# Patient Record
Sex: Male | Born: 1945 | Race: White | Hispanic: No | Marital: Married | Smoking: Former smoker
Health system: Southern US, Community
[De-identification: ages and names within clinical notes are randomized; demographics above are authoritative.]

## PROBLEM LIST (undated history)

## (undated) DIAGNOSIS — I4891 Unspecified atrial fibrillation: Secondary | ICD-10-CM

## (undated) DIAGNOSIS — F32A Depression, unspecified: Secondary | ICD-10-CM

## (undated) DIAGNOSIS — I959 Hypotension, unspecified: Secondary | ICD-10-CM

## (undated) DIAGNOSIS — F329 Major depressive disorder, single episode, unspecified: Secondary | ICD-10-CM

## (undated) DIAGNOSIS — G629 Polyneuropathy, unspecified: Secondary | ICD-10-CM

## (undated) DIAGNOSIS — K219 Gastro-esophageal reflux disease without esophagitis: Secondary | ICD-10-CM

## (undated) DIAGNOSIS — Z950 Presence of cardiac pacemaker: Secondary | ICD-10-CM

---
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
## 2018-06-09 ENCOUNTER — Other Ambulatory Visit: Payer: Self-pay

## 2018-06-09 ENCOUNTER — Other Ambulatory Visit (HOSPITAL_COMMUNITY): Payer: Self-pay

## 2018-06-09 DIAGNOSIS — M542 Cervicalgia: Secondary | ICD-10-CM

## 2018-07-11 ENCOUNTER — Encounter (HOSPITAL_COMMUNITY): Payer: Self-pay

## 2018-07-11 ENCOUNTER — Ambulatory Visit (HOSPITAL_COMMUNITY): Payer: Non-veteran care

## 2019-05-31 ENCOUNTER — Other Ambulatory Visit: Payer: Self-pay

## 2019-05-31 ENCOUNTER — Emergency Department (HOSPITAL_COMMUNITY)
Admission: EM | Admit: 2019-05-31 | Discharge: 2019-05-31 | Disposition: A | Discharge status: Home / Self Care | Payer: No Typology Code available for payment source | Attending: Emergency Medicine | Admitting: Emergency Medicine

## 2019-05-31 ENCOUNTER — Encounter (HOSPITAL_COMMUNITY): Payer: Self-pay | Admitting: Emergency Medicine

## 2019-05-31 ENCOUNTER — Emergency Department (HOSPITAL_COMMUNITY): Payer: No Typology Code available for payment source

## 2019-05-31 DIAGNOSIS — Y999 Unspecified external cause status: Secondary | ICD-10-CM | POA: Diagnosis not present

## 2019-05-31 DIAGNOSIS — I4891 Unspecified atrial fibrillation: Secondary | ICD-10-CM | POA: Insufficient documentation

## 2019-05-31 DIAGNOSIS — G44309 Post-traumatic headache, unspecified, not intractable: Secondary | ICD-10-CM

## 2019-05-31 DIAGNOSIS — S79911A Unspecified injury of right hip, initial encounter: Secondary | ICD-10-CM | POA: Insufficient documentation

## 2019-05-31 DIAGNOSIS — Y92015 Private garage of single-family (private) house as the place of occurrence of the external cause: Secondary | ICD-10-CM | POA: Diagnosis not present

## 2019-05-31 DIAGNOSIS — M85652 Other cyst of bone, left thigh: Secondary | ICD-10-CM | POA: Diagnosis not present

## 2019-05-31 DIAGNOSIS — Y939 Activity, unspecified: Secondary | ICD-10-CM | POA: Insufficient documentation

## 2019-05-31 DIAGNOSIS — M25551 Pain in right hip: Secondary | ICD-10-CM

## 2019-05-31 DIAGNOSIS — W01198A Fall on same level from slipping, tripping and stumbling with subsequent striking against other object, initial encounter: Secondary | ICD-10-CM | POA: Diagnosis not present

## 2019-05-31 DIAGNOSIS — Z950 Presence of cardiac pacemaker: Secondary | ICD-10-CM | POA: Insufficient documentation

## 2019-05-31 HISTORY — DX: Gastro-esophageal reflux disease without esophagitis: K21.9

## 2019-05-31 HISTORY — DX: Polyneuropathy, unspecified: G62.9

## 2019-05-31 HISTORY — DX: Presence of cardiac pacemaker: Z95.0

## 2019-05-31 HISTORY — DX: Hypotension, unspecified: I95.9

## 2019-05-31 HISTORY — DX: Depression, unspecified: F32.A

## 2019-05-31 HISTORY — DX: Unspecified atrial fibrillation: I48.91

## 2019-05-31 NOTE — ED Triage Notes (Signed)
Patient reports holding dog and got pulled over, falling on left hip, landing on concrete. C/o left hip radiating to knee.

## 2019-05-31 NOTE — ED Provider Notes (Addendum)
[facility] DEPT Provider Note   CSN: [id number] Arrival date & time: 05/31/19  1610     History Chief Complaint  Patient presents with  . Hip Pain    Jack Neal is a 74 y.o. male.   Jack Neal is a 74 year old gentleman with a history of A. fib and GERD who presents after a mechanical fall today.  Patient states that he was in his garage, seated in a chair when a neighbor's dog ran into the garage.  He stood up, and started to grab the dog to try to pull, but the dog pulled him over.  The patient fell on his left hip and hit the back of his head.  No LOC.  At that time he had some left hip pain and pain at the back of his head.  He took some Tylenol, improved his symptoms, then came to the ED for further evaluation. States that he is able to stand and walk, but uses a cane at baseline to ambulate due to balance issues and baseline hip weakness.  He denies any fever, headache, vision changes, new weakness, numbness or tingling, chest pain, SOB, changes in his bowel or bladder habits.       Past Medical History:  Diagnosis Date  . Atrial fibrillation ([facility])   . Depression   . GERD (gastroesophageal reflux disease)   . Hypotension   . Neuropathy   . Pacemaker    There are no problems to display for this patient.   No family history on file.  Social History   Tobacco Use  . Smoking status: Not on file  Substance Use Topics  . Alcohol use: Not on file  . Drug use: Not on file   Home Medications Prior to Admission medications   Not on File   Allergies    Patient has no known allergies.  Review of Systems   Review of Systems  Constitutional: Negative.   HENT: Negative.   Eyes: Negative.   Respiratory: Negative.   Cardiovascular: Negative.   Gastrointestinal: Negative.   Endocrine: Negative.   Genitourinary: Negative.   Musculoskeletal:       L hip soreness  Skin: Negative.   Allergic/Immunologic: Negative.   Neurological:  Negative.   Hematological: Negative.   Psychiatric/Behavioral: Negative.    Physical Exam Updated Vital Signs BP 108/67   Pulse 80   Temp 98.2 F (36.8 C)   Resp 17   SpO2 100%   Physical Exam Vitals reviewed.  Constitutional:      General: He is not in acute distress.    Appearance: Normal appearance. He is normal weight. He is not ill-appearing, toxic-appearing or diaphoretic.  HENT:     Head: Normocephalic and atraumatic.     Mouth/Throat:     Mouth: Mucous membranes are moist.  Eyes:     General:        Right eye: No discharge.        Left eye: No discharge.     Extraocular Movements: Extraocular movements intact.     Pupils: Pupils are equal, round, and reactive to light.  Cardiovascular:     Rate and Rhythm: Normal rate and regular rhythm.     Pulses: Normal pulses.     Heart sounds: Normal heart sounds. No murmur. No friction rub. No gallop.   Musculoskeletal:        General: Tenderness (R hip) present. No deformity or signs of injury.  Skin:    General: Skin  is warm.  Neurological:     General: No focal deficit present.     Mental Status: He is alert and oriented to person, place, and time.     Cranial Nerves: No cranial nerve deficit.     Motor: No weakness.     Gait: Gait abnormal (antalgic gate).  Psychiatric:        Mood and Affect: Mood normal.    ED Results / Procedures / Treatments   Labs (all labs ordered are listed, but only abnormal results are displayed) Labs Reviewed - No data to display  EKG None  Radiology CT Hip Left Wo Contrast  Result Date: 05/31/2019 CLINICAL DATA:  74 year old male with fall and left hip pain. EXAM: CT OF THE LEFT HIP WITHOUT CONTRAST TECHNIQUE: Multidetector CT imaging of the left hip was performed according to the standard protocol. Multiplanar CT image reconstructions were also generated. COMPARISON:  Left hip radiograph dated 05/31/2019. FINDINGS: Bones/Joint/Cartilage There is no acute fracture or dislocation.  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 There is focal area of discontinuity of the anterior cortex of the cyst, likely chronic. This can predispose to a fracture. As per history the patient is not a candidate for MRI due to pacemaker. Orthopedic consult is advised for further evaluation. No joint effusion. Ligaments Suboptimally assessed by CT. Muscles and Tendons No acute findings. Soft tissues No acute findings. No fluid collection. IMPRESSION: 1. No acute fracture or dislocation. 2. 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 There is focal discontinuity of the anterior cortex of the femoral neck along the cyst, chronic. Orthopedic consult is advised for further evaluation. Electronically Signed   By: Elgie Collard M.D.   On: 05/31/2019 19:46   DG Hip Unilat With Pelvis 2-3 Views Left  Result Date: 05/31/2019 CLINICAL DATA:  Fall after being pulled by dog onto concrete. Left hip pain. EXAM: DG HIP (WITH OR WITHOUT PELVIS) 2-3V LEFT COMPARISON:  None. FINDINGS: The cortical margins of the bony pelvis and left hip are intact. No fracture. Pubic symphysis and sacroiliac joints are congruent. Pubic rami are intact. Both femoral heads are well-seated in the respective acetabula. Minor bilateral hip joint space narrowing. Bones appear diffusely under mineralized. IMPRESSION: No fracture of the pelvis or left hip. Electronically Signed   By: Narda Rutherford M.D.   On: 05/31/2019 16:58   Procedures Procedures (including critical care time)  Medications Ordered in ED Medications - No data to display  ED Course  I have reviewed the triage vital signs and the nursing notes.  Pertinent labs & imaging results that were available during my care of the patient were reviewed by me and considered in my medical decision making (see chart for details).    MDM Rules/Calculators/A&P                       Patient sustained a fall and head impact on his left hip and back of his head.  On exam, the patient has some tenderness at the left hip, but is able to stand and walk with an antalgic gate in the room. X-ray negative for any acute fractures.  I suspect the patient has post traumatic pain due to soft tissue injury. Neurological exam was unremarkable. Will obtain CT of the hip to r/o fracture (cannot get MRI due to pacemaker)   CT of  the hip was negative for any fracture, but did demonstrate bone cyst. Reccommended to follow up with Ortho.    Discussed with Ortho, they recommend follow up in the outpatient setting since the cyst is an incidental finding. Pt medically stable for discharge.  Final Clinical Impression(s) / ED Diagnoses Final diagnoses:  None   Rx / DC Orders ED Discharge Orders    None     Kirt Boys, MD 05/31/19 Garnette Scheuermann  Kirt Boys, MD 05/31/19 1933    Kirt Boys, MD 05/31/19 2013    Tilden Fossa, MD 06/03/19 1044

## 2019-05-31 NOTE — Discharge Instructions (Addendum)
Thank you for allowing [place] to take care of you today.  Below is a summary of what we discussed:  1.  Hip pain -Your x-ray and CT of the hip did not show any fracture. I suspect your pain will improve in the coming days. 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  -Please schedule a follow-up appointment with your primary provider.  -Use ice packs, ibuprofen and Tylenol as needed.  Make sure you take the ibuprofen with food.   2.  Posttraumatic headache  -I did not see a bruise on the back of your head where you hit your head. -If you start developing increased headache, confusion, weakness, numbness or tingling please return to the ED.  This may be a sign that you have a bleed in your head.  3.  Follow-up -Please follow-up with your primary care provider as soon as possible

## 2020-09-24 ENCOUNTER — Telehealth: Payer: Self-pay

## 2020-09-24 NOTE — Telephone Encounter (Signed)
Referral notes sent from [facility] , Phone #: [phone], Fax #: [phone]   Notes sent to scheduling

## 2020-10-01 ENCOUNTER — Emergency Department (HOSPITAL_COMMUNITY): Payer: No Typology Code available for payment source

## 2020-10-01 ENCOUNTER — Inpatient Hospital Stay (HOSPITAL_COMMUNITY)
Admission: EM | Admit: 2020-10-01 | Discharge: 2020-10-05 | DRG: 948 | Disposition: A | Discharge status: Home Health | Payer: No Typology Code available for payment source | Attending: Family Medicine | Admitting: Family Medicine

## 2020-10-01 ENCOUNTER — Encounter (HOSPITAL_COMMUNITY): Payer: Self-pay

## 2020-10-01 DIAGNOSIS — Z888 Allergy status to other drugs, medicaments and biological substances status: Secondary | ICD-10-CM

## 2020-10-01 DIAGNOSIS — W19XXXA Unspecified fall, initial encounter: Secondary | ICD-10-CM | POA: Diagnosis present

## 2020-10-01 DIAGNOSIS — I480 Paroxysmal atrial fibrillation: Secondary | ICD-10-CM | POA: Diagnosis present

## 2020-10-01 DIAGNOSIS — F1110 Opioid abuse, uncomplicated: Secondary | ICD-10-CM | POA: Diagnosis present

## 2020-10-01 DIAGNOSIS — Z886 Allergy status to analgesic agent status: Secondary | ICD-10-CM | POA: Diagnosis not present

## 2020-10-01 DIAGNOSIS — Z9884 Bariatric surgery status: Secondary | ICD-10-CM | POA: Diagnosis not present

## 2020-10-01 DIAGNOSIS — Z950 Presence of cardiac pacemaker: Secondary | ICD-10-CM

## 2020-10-01 DIAGNOSIS — Z20822 Contact with and (suspected) exposure to covid-19: Secondary | ICD-10-CM | POA: Diagnosis present

## 2020-10-01 DIAGNOSIS — G4733 Obstructive sleep apnea (adult) (pediatric): Secondary | ICD-10-CM | POA: Diagnosis present

## 2020-10-01 DIAGNOSIS — Z66 Do not resuscitate: Secondary | ICD-10-CM | POA: Diagnosis present

## 2020-10-01 DIAGNOSIS — Z9989 Dependence on other enabling machines and devices: Secondary | ICD-10-CM

## 2020-10-01 DIAGNOSIS — G629 Polyneuropathy, unspecified: Secondary | ICD-10-CM

## 2020-10-01 DIAGNOSIS — K219 Gastro-esophageal reflux disease without esophagitis: Secondary | ICD-10-CM | POA: Diagnosis present

## 2020-10-01 DIAGNOSIS — I071 Rheumatic tricuspid insufficiency: Secondary | ICD-10-CM | POA: Diagnosis present

## 2020-10-01 DIAGNOSIS — F329 Major depressive disorder, single episode, unspecified: Secondary | ICD-10-CM | POA: Diagnosis present

## 2020-10-01 DIAGNOSIS — R0602 Shortness of breath: Secondary | ICD-10-CM | POA: Diagnosis not present

## 2020-10-01 DIAGNOSIS — Z9109 Other allergy status, other than to drugs and biological substances: Secondary | ICD-10-CM

## 2020-10-01 DIAGNOSIS — R2681 Unsteadiness on feet: Secondary | ICD-10-CM | POA: Diagnosis present

## 2020-10-01 DIAGNOSIS — R251 Tremor, unspecified: Secondary | ICD-10-CM | POA: Diagnosis present

## 2020-10-01 DIAGNOSIS — Q600 Renal agenesis, unilateral: Secondary | ICD-10-CM | POA: Diagnosis not present

## 2020-10-01 DIAGNOSIS — I959 Hypotension, unspecified: Secondary | ICD-10-CM | POA: Diagnosis present

## 2020-10-01 DIAGNOSIS — G894 Chronic pain syndrome: Secondary | ICD-10-CM | POA: Diagnosis present

## 2020-10-01 DIAGNOSIS — F4310 Post-traumatic stress disorder, unspecified: Secondary | ICD-10-CM | POA: Diagnosis present

## 2020-10-01 DIAGNOSIS — H532 Diplopia: Secondary | ICD-10-CM | POA: Diagnosis present

## 2020-10-01 DIAGNOSIS — I472 Ventricular tachycardia: Secondary | ICD-10-CM | POA: Diagnosis present

## 2020-10-01 DIAGNOSIS — Z880 Allergy status to penicillin: Secondary | ICD-10-CM | POA: Diagnosis not present

## 2020-10-01 DIAGNOSIS — K900 Celiac disease: Secondary | ICD-10-CM | POA: Insufficient documentation

## 2020-10-01 DIAGNOSIS — I4510 Unspecified right bundle-branch block: Secondary | ICD-10-CM | POA: Diagnosis present

## 2020-10-01 DIAGNOSIS — F32A Depression, unspecified: Secondary | ICD-10-CM | POA: Diagnosis not present

## 2020-10-01 DIAGNOSIS — R930 Abnormal findings on diagnostic imaging of skull and head, not elsewhere classified: Secondary | ICD-10-CM | POA: Diagnosis not present

## 2020-10-01 DIAGNOSIS — D61818 Other pancytopenia: Secondary | ICD-10-CM | POA: Diagnosis not present

## 2020-10-01 DIAGNOSIS — I739 Peripheral vascular disease, unspecified: Secondary | ICD-10-CM | POA: Diagnosis not present

## 2020-10-01 DIAGNOSIS — R531 Weakness: Secondary | ICD-10-CM | POA: Diagnosis present

## 2020-10-01 DIAGNOSIS — F419 Anxiety disorder, unspecified: Secondary | ICD-10-CM | POA: Diagnosis not present

## 2020-10-01 HISTORY — DX: Major depressive disorder, single episode, unspecified: F32.9

## 2020-10-01 HISTORY — DX: Polyneuropathy, unspecified: G62.9

## 2020-10-01 HISTORY — DX: Paroxysmal atrial fibrillation: I48.0

## 2020-10-01 HISTORY — DX: Post-traumatic stress disorder, unspecified: F43.10

## 2020-10-01 HISTORY — DX: Bariatric surgery status: Z98.84

## 2020-10-01 HISTORY — DX: Celiac disease: K90.0

## 2020-10-01 HISTORY — DX: Chronic pain syndrome: G89.4

## 2020-10-01 HISTORY — DX: Obstructive sleep apnea (adult) (pediatric): G47.33

## 2020-10-01 HISTORY — DX: Presence of cardiac pacemaker: Z95.0

## 2020-10-01 LAB — RAPID URINE DRUG SCREEN, HOSP PERFORMED
Amphetamines: NOT DETECTED
Barbiturates: NOT DETECTED
Benzodiazepines: NOT DETECTED
Cocaine: NOT DETECTED
Opiates: NOT DETECTED
Tetrahydrocannabinol: NOT DETECTED

## 2020-10-01 LAB — URINALYSIS, ROUTINE W REFLEX MICROSCOPIC
Bilirubin Urine: NEGATIVE
Glucose, UA: NEGATIVE mg/dL
Hgb urine dipstick: NEGATIVE
Ketones, ur: NEGATIVE mg/dL
Leukocytes,Ua: NEGATIVE
Nitrite: NEGATIVE
Protein, ur: NEGATIVE mg/dL
Specific Gravity, Urine: 1.014 (ref 1.005–1.030)
pH: 7 (ref 5.0–8.0)

## 2020-10-01 LAB — CBC
HCT: 38.2 % — ABNORMAL LOW (ref 39.0–52.0)
Hemoglobin: 12 g/dL — ABNORMAL LOW (ref 13.0–17.0)
MCH: 32.1 pg (ref 26.0–34.0)
MCHC: 31.4 g/dL (ref 30.0–36.0)
MCV: 102.1 fL — ABNORMAL HIGH (ref 80.0–100.0)
Platelets: 153 10*3/uL (ref 150–400)
RBC: 3.74 MIL/uL — ABNORMAL LOW (ref 4.22–5.81)
RDW: 13.8 % (ref 11.5–15.5)
WBC: 5.3 10*3/uL (ref 4.0–10.5)
nRBC: 0 % (ref 0.0–0.2)

## 2020-10-01 LAB — COMPREHENSIVE METABOLIC PANEL
ALT: 18 U/L (ref 0–44)
AST: 34 U/L (ref 15–41)
Albumin: 4.1 g/dL (ref 3.5–5.0)
Alkaline Phosphatase: 138 U/L — ABNORMAL HIGH (ref 38–126)
Anion gap: 10 (ref 5–15)
BUN: 22 mg/dL (ref 8–23)
CO2: 34 mmol/L — ABNORMAL HIGH (ref 22–32)
Calcium: 9.1 mg/dL (ref 8.9–10.3)
Chloride: 95 mmol/L — ABNORMAL LOW (ref 98–111)
Creatinine, Ser: 0.72 mg/dL (ref 0.61–1.24)
GFR, Estimated: >60 mL/min (ref >60)
Glucose, Bld: 79 mg/dL (ref 70–99)
Potassium: 4.3 mmol/L (ref 3.5–5.1)
Sodium: 139 mmol/L (ref 135–145)
Total Bilirubin: 1.1 mg/dL (ref 0.3–1.2)
Total Protein: 7.3 g/dL (ref 6.5–8.1)

## 2020-10-01 LAB — I-STAT CHEM 8, ED
BUN: 21 mg/dL (ref 8–23)
Calcium, Ion: 1.17 mmol/L (ref 1.15–1.40)
Chloride: 95 mmol/L — ABNORMAL LOW (ref 98–111)
Creatinine, Ser: 0.8 mg/dL (ref 0.61–1.24)
Glucose, Bld: 79 mg/dL (ref 70–99)
HCT: 38 % — ABNORMAL LOW (ref 39.0–52.0)
Hemoglobin: 12.9 g/dL — ABNORMAL LOW (ref 13.0–17.0)
Potassium: 4.2 mmol/L (ref 3.5–5.1)
Sodium: 137 mmol/L (ref 135–145)
TCO2: 33 mmol/L — ABNORMAL HIGH (ref 22–32)

## 2020-10-01 LAB — RESP PANEL BY RT-PCR (FLU A&B, COVID) ARPGX2
Influenza A by PCR: NEGATIVE
Influenza B by PCR: NEGATIVE
SARS Coronavirus 2 by RT PCR: NEGATIVE

## 2020-10-01 LAB — DIFFERENTIAL
Abs Immature Granulocytes: 0.01 10*3/uL (ref 0.00–0.07)
Basophils Absolute: 0 10*3/uL (ref 0.0–0.1)
Basophils Relative: 1 %
Eosinophils Absolute: 0.2 10*3/uL (ref 0.0–0.5)
Eosinophils Relative: 3 %
Immature Granulocytes: 0 %
Lymphocytes Relative: 42 %
Lymphs Abs: 2.2 10*3/uL (ref 0.7–4.0)
Monocytes Absolute: 0.7 10*3/uL (ref 0.1–1.0)
Monocytes Relative: 14 %
Neutro Abs: 2.1 10*3/uL (ref 1.7–7.7)
Neutrophils Relative %: 40 %

## 2020-10-01 LAB — ETHANOL: Alcohol, Ethyl (B): <10 mg/dL (ref <10)

## 2020-10-01 LAB — PROTIME-INR
INR: 1 (ref 0.8–1.2)
Prothrombin Time: 13.1 seconds (ref 11.4–15.2)

## 2020-10-01 LAB — APTT: aPTT: 33 seconds (ref 24–36)

## 2020-10-01 MED ORDER — DIPHENHYDRAMINE HCL 50 MG/ML IJ SOLN
12.5000 mg | Freq: Once | INTRAMUSCULAR | Status: AC
  Administered 2020-10-01: 12.5 mg via INTRAVENOUS
  Filled 2020-10-01: qty 1

## 2020-10-01 MED ORDER — ENOXAPARIN SODIUM 40 MG/0.4ML IJ SOSY
40.0000 mg | PREFILLED_SYRINGE | INTRAMUSCULAR | Status: DC
  Administered 2020-10-01 – 2020-10-04 (×4): 40 mg via SUBCUTANEOUS
  Filled 2020-10-01 (×3): qty 0.4

## 2020-10-01 MED ORDER — ACETAMINOPHEN 325 MG PO TABS
650.0000 mg | ORAL_TABLET | ORAL | Status: DC | PRN
  Administered 2020-10-02 – 2020-10-04 (×5): 650 mg via ORAL
  Filled 2020-10-01 (×5): qty 2

## 2020-10-01 MED ORDER — ACETAMINOPHEN 160 MG/5ML PO SOLN: 650.0000 mg | ORAL | Status: DC | PRN

## 2020-10-01 MED ORDER — PANCRELIPASE (LIP-PROT-AMYL) 36000-114000 UNITS PO CPEP
36000.0000 [IU] | ORAL_CAPSULE | Freq: Three times a day (TID) | ORAL | Status: DC
  Administered 2020-10-02 – 2020-10-05 (×11): 36000 [IU] via ORAL
  Filled 2020-10-01 (×15): qty 1

## 2020-10-01 MED ORDER — BUPRENORPHINE HCL-NALOXONE HCL 2-0.5 MG SL SUBL
0.5000 | SUBLINGUAL_TABLET | Freq: Every day | SUBLINGUAL | Status: DC
  Administered 2020-10-02 – 2020-10-05 (×4): 0.5 via SUBLINGUAL
  Filled 2020-10-01: qty 2
  Filled 2020-10-01 (×3): qty 1

## 2020-10-01 MED ORDER — DULOXETINE HCL 60 MG PO CPEP
60.0000 mg | ORAL_CAPSULE | Freq: Every day | ORAL | Status: DC
  Administered 2020-10-02 – 2020-10-05 (×4): 60 mg via ORAL
  Filled 2020-10-01: qty 2
  Filled 2020-10-01 (×2): qty 1
  Filled 2020-10-01: qty 2

## 2020-10-01 MED ORDER — GABAPENTIN 300 MG PO CAPS
300.0000 mg | ORAL_CAPSULE | Freq: Three times a day (TID) | ORAL | Status: DC
  Administered 2020-10-01 – 2020-10-05 (×11): 300 mg via ORAL
  Filled 2020-10-01 (×11): qty 1

## 2020-10-01 MED ORDER — POLYVINYL ALCOHOL 1.4 % OP SOLN
1.0000 [drp] | OPHTHALMIC | Status: DC | PRN
  Filled 2020-10-01 (×2): qty 15

## 2020-10-01 MED ORDER — HYDROXYZINE HCL 10 MG PO TABS
20.0000 mg | ORAL_TABLET | Freq: Two times a day (BID) | ORAL | Status: DC
  Administered 2020-10-01 – 2020-10-05 (×6): 20 mg via ORAL
  Filled 2020-10-01 (×9): qty 2

## 2020-10-01 MED ORDER — ONDANSETRON HCL 4 MG/2ML IJ SOLN: 4.0000 mg | Freq: Four times a day (QID) | INTRAMUSCULAR | Status: DC | PRN

## 2020-10-01 MED ORDER — LORAZEPAM 2 MG/ML IJ SOLN
1.0000 mg | Freq: Once | INTRAMUSCULAR | Status: AC
  Administered 2020-10-01: 1 mg via INTRAVENOUS
  Filled 2020-10-01: qty 1

## 2020-10-01 MED ORDER — POLYETHYLENE GLYCOL 3350 17 G PO PACK
17.0000 g | PACK | Freq: Every day | ORAL | Status: DC | PRN
  Administered 2020-10-04: 17 g via ORAL
  Filled 2020-10-01: qty 1

## 2020-10-01 MED ORDER — DULOXETINE HCL 30 MG PO CPEP
30.0000 mg | ORAL_CAPSULE | Freq: Every day | ORAL | Status: DC
  Administered 2020-10-01 – 2020-10-04 (×4): 30 mg via ORAL
  Filled 2020-10-01 (×4): qty 1

## 2020-10-01 MED ORDER — STROKE: EARLY STAGES OF RECOVERY BOOK
Freq: Once | Status: AC
  Filled 2020-10-01: qty 1

## 2020-10-01 MED ORDER — ACETAMINOPHEN 650 MG RE SUPP: 650.0000 mg | RECTAL | Status: DC | PRN

## 2020-10-01 NOTE — ED Provider Notes (Signed)
[facility]  HOSPITAL-EMERGENCY DEPT Provider Note   CSN: [id number] Arrival date & time: 10/01/20  1459     History Chief Complaint  Patient presents with   Weakness   Tremors    Jack Neal is a 75 y.o. male.  75 year old male presents with cute onset of tremors which began today.  No prior history of same.  Wife states that his medications have been current and without change.  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  No history of Parkinson's.  Has had uncontrollable tremors which wax and wane.  Nothing makes them better or worse.  No reported fever.  Has had trouble initiating speech.  No postictal period prior to these episodes.  No loss of bowel or bladder dysfunction.  Does have a history of A. fib but is not taking medications for this currently.  No treatment use prior to arrival      Past Medical History:  Diagnosis Date   Atrial fibrillation ([name])    Depression    GERD (gastroesophageal reflux disease)    Hypotension    Neuropathy    Pacemaker     There are no problems to display for this patient.   History reviewed. No pertinent surgical history.     History reviewed. No pertinent family history.     Home Medications Prior to Admission medications   Not on File    Allergies    Patient has no known allergies.  Review of Systems   Review of Systems  All other systems reviewed and are negative.  Physical Exam Updated Vital Signs BP 117/72 (BP Location: Left Arm)   Pulse 82   Temp 97.7 F (36.5 C) (Oral)   Resp 20   Ht 1.803 m (5\' 11" )   Wt 77.1 kg   SpO2 99%   BMI 23.71 kg/m   Physical Exam Vitals and nursing note reviewed.  Constitutional:      General: He is not in acute distress.    Appearance: Normal appearance. He is well-developed. He is not toxic-appearing.  HENT:     Head: Normocephalic and atraumatic.  Eyes:     General: Lids are normal.     Conjunctiva/sclera: Conjunctivae normal.      Pupils: Pupils are equal, round, and reactive to light.  Neck:     Thyroid: No thyroid mass.     Trachea: No tracheal deviation.  Cardiovascular:     Rate and Rhythm: Normal rate and regular rhythm.     Heart sounds: Normal heart sounds. No murmur heard.   No gallop.  Pulmonary:     Effort: Pulmonary effort is normal. No respiratory distress.     Breath sounds: Normal breath sounds. No stridor. No decreased breath sounds, wheezing, rhonchi or rales.  Abdominal:     General: There is no distension.     Palpations: Abdomen is soft.     Tenderness: There is no abdominal tenderness. There is no rebound.  Musculoskeletal:        General: No tenderness. Normal range of motion.     Cervical back: Normal range of motion and neck supple.  Skin:    General: Skin is warm and dry.     Findings: No abrasion or rash.  Neurological:     Mental Status: He is alert and oriented to person, place, and time. Mental status is at baseline.     GCS: GCS eye subscore is 4. GCS verbal subscore is 5. GCS motor subscore  is 6.     Cranial Nerves: Cranial nerves are intact. No cranial nerve deficit.     Sensory: No sensory deficit.     Motor: Tremor present.     Comments: Speech is slightly slurred  Psychiatric:        Attention and Perception: Attention normal.        Speech: Speech normal.        Behavior: Behavior normal.    ED Results / Procedures / Treatments   Labs (all labs ordered are listed, but only abnormal results are displayed) Labs Reviewed  RESP PANEL BY RT-PCR (FLU A&B, COVID) ARPGX2  ETHANOL  PROTIME-INR  APTT  CBC  DIFFERENTIAL  COMPREHENSIVE METABOLIC PANEL  RAPID URINE DRUG SCREEN, HOSP PERFORMED  URINALYSIS, ROUTINE W REFLEX MICROSCOPIC  I-STAT CHEM 8, ED    EKG EKG Interpretation  Date/Time:  Tuesday October 01 2020 15:27:26 EDT Ventricular Rate:  84 PR Interval:  176 QRS Duration: 140 QT Interval:  407 QTC Calculation: 482 R Axis:   -71 Text Interpretation: Sinus  rhythm IVCD, consider atypical RBBB Left ventricular hypertrophy Anterolateral infarct, age indeterminate Confirmed by Lorre Nick ([id number]) on 10/01/2020 3:49:33 PM  Radiology No results found.  Procedures Procedures   Medications Ordered in ED Medications  LORazepam (ATIVAN) injection 1 mg (has no administration in time range)  diphenhydrAMINE (BENADRYL) injection 12.5 mg (has no administration in time range)    ED Course  I have reviewed the triage vital signs and the nursing notes.  Pertinent labs & imaging results that were available during my care of the patient were reviewed by me and considered in my medical decision making (see chart for details).    MDM Rules/Calculators/A&P                           Patient given Ativan and Benadryl which did help his symptoms.  Head CT showed questionable mass.  Patient has been complaining of headache.  His sensorium is somewhat more improved at this time.  Tremors did eventually return back.  Discussed with neurology on-call who recommends admission to [facility] with MRI over there to be formed as patient has a pacemaker which needs to be adjusted there.  Will consult hospitalist Final Clinical Impression(s) / ED Diagnoses Final diagnoses:  None    Rx / DC Orders ED Discharge Orders     None        Lorre Nick, MD 10/01/20 2032

## 2020-10-01 NOTE — ED Provider Notes (Signed)
Emergency Medicine Provider Triage Evaluation Note  Jack Neal , a 75 y.o. male  was evaluated in triage.  Pt complains of generalized weakness and speech changes that started this morning around 8 AM.  Patient has a history of A. fib but not currently on any anticoagulants.  Patient denies facial droop, unilateral weakness, and visual changes.  Patient states symptoms started while he was on a walk this morning.  Patient also endorses full body tremors.  No history of Parkinson's or tremors.  Wife at bedside notes that patient has fallen a few times today.  Review of Systems  Positive: Speech changes, tremor Negative: fever  Physical Exam  BP 117/72 (BP Location: Left Arm)   Pulse 82   Temp 97.7 F (36.5 C) (Oral)   Resp 20   Ht 5\' 11"  (1.803 m)   Wt 77.1 kg   SpO2 99%   BMI 23.71 kg/m  Gen:   Awake, no distress   Resp:  Normal effort  MSK:   Moves extremities without difficulty  Other:  No facial droop.  Broken up speech.  Full body tremors. Equal grip strength.  Medical Decision Making  Medically screening exam initiated at 3:17 PM.  Appropriate orders placed.  Fredie Majano was informed that the remainder of the evaluation will be completed by another provider, this initial triage assessment does not replace that evaluation, and the importance of remaining in the ED until their evaluation is complete.  Patient brought back to room after triage.  Out of tPA window.  Stroke labs and CT head ordered.   Lise Auer, PA-C 10/01/20 1519    10/03/20, MD 10/01/20 [phone]

## 2020-10-01 NOTE — H&P (Signed)
History and Physical    Jack Neal QMG:[id number] DOB: Jan 09, 1946 DOA: 10/01/2020  PCP: [facility]  Patient coming from: Home   Chief Complaint:  Chief Complaint  Patient presents with   Weakness   Tremors     HPI:    75 year old male with past medical history of gastric bypass, atrial fibrillation (not on anticoagulation), status post pacemaker (original placed in 2002, changed in 2012, now has Medtronic Model [phone]), obstructive sleep apnea on CPAP, PTSD, major depressive disorder, panic disorder, chronic pain syndrome with opiate dependence who presents to [facility] emergency department after experiencing episodes of severe generalized weakness and fatigue.  Patient explains that typically on an average day he will walk 2 miles a day.  He typically uses a Rollator ever since he began to develop episodes of intermittent vertigo and unsteady gait.  Patient explains that for approximately past 8 months he has been evaluated by neurology and his primary care provider at the [facility] hospital without a determined etiology of his episodes of vertigo and difficulty with ambulation.  Patient explains that he he had just begun his walk the morning of 7/19 when he began to feel an episode of intense weakness in both of his legs.  Sat down immediately but explains that he felt that "everything looks sideways" for approximately 5 minutes.  Patient denies any focal weakness, headache, changes in vision or slurred speech.  Patient then began to slowly stand up and try to ambulate home and was able to do so however he continued to feel extremely weak and fatigued.  It is at this point the patient decided to go to [facility] emergency department for evaluation.  Upon evaluation in the emergency department patient seen neurologically intact.  Noncontrast CT head was performed revealing suggestion of asymmetric density of the occipital lobe with the left diminished compared to  the right.  It was unclear as to whether or not this is true secondary to subacute ischemia or due to the technical quality of the images due to asymmetric positioning.  Considering patient's history of atrial fibrillation and multiple other comorbidities Case was discussed with Dr. Derry Lory with neurology who recommended transferring the patient to [facility] for formal neurology consultation and MRI brain with and without contrast.  Due to patient's cardiac device MRI will have to be performed at [facility].  The hospitalist group was then called to assess the patient for admission to the hospital.  Review of Systems:   Review of Systems  Constitutional:  Positive for malaise/fatigue.  Neurological:  Positive for dizziness and weakness.  All other systems reviewed and are negative.  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   Hypotension    Major depressive disorder 10/01/2020   Neuropathy    OSA on CPAP 10/01/2020   Pacemaker    Peripheral polyneuropathy 10/01/2020   Presence of cardiac pacemaker 10/01/2020   PTSD (post-traumatic stress disorder) 10/01/2020    History reviewed. No pertinent surgical history.   reports that he has never smoked. He has never used smokeless tobacco. He reports that he does not drink alcohol and does not use drugs.  Allergies  Allergen Reactions   Amphetamine Rash and Other (See Comments)  Altered mental status- amnesia (also)   Diazepam Rash    Other reaction(s): Amnesia, Mental Status Changes (intolerance), Other amnesia    Zoloft [Sertraline] Anxiety, Rash and Other (See Comments)    Panic attacks and Cardiovascular Arrest (ALSO)    Gluten Meal Diarrhea   Penicillin G Rash    Family History   Problem Relation Age of Onset   Heart disease Neg Hx      Prior to Admission medications   Not on File    Physical Exam: Vitals:   10/01/20 1935 10/01/20 2000 10/01/20 2030 10/01/20 2230  BP: 111/85 124/83 115/80 116/78  Pulse: 82 80 79 82  Resp: (!) 21 10 15 19   Temp:      TempSrc:      SpO2: 99% 100% 99% 93%  Weight:      Height:        Constitutional: Awake alert and oriented x3, no associated distress.   Skin: no rashes, no lesions, good skin turgor noted. Eyes: Pupils are equally reactive to light.  No evidence of scleral icterus or conjunctival pallor.  ENMT: Moist mucous membranes noted.  Posterior pharynx clear of any exudate or lesions.   Neck: normal, supple, no masses, no thyromegaly.  No evidence of jugular venous distension.   Respiratory: clear to auscultation bilaterally, no wheezing, no crackles. Normal respiratory effort. No accessory muscle use.  Cardiovascular: Regular rate and rhythm, no murmurs / rubs / gallops. No extremity edema. 2+ pedal pulses. No carotid bruits.  Chest:   Nontender without crepitus or deformity.   Back:   Nontender without crepitus or deformity. Abdomen: Abdomen is soft and nontender.  No evidence of intra-abdominal masses.  Positive bowel sounds noted in all quadrants.   Musculoskeletal: No joint deformity upper and lower extremities. Good ROM, no contractures. Normal muscle tone.  Neurologic: CN 2-12 grossly intact. Sensation intact.  Patient moving all 4 extremities spontaneously.  Patient is following all commands.  Patient is responsive to verbal stimuli.   Psychiatric: Patient exhibits normal mood with odd affect.  Patient seems to possess insight as to their current situation.     Labs on Admission: I have personally reviewed following labs and imaging studies -   CBC: Recent Labs  Lab 10/01/20 1517 10/01/20 1553  WBC 5.3  --   NEUTROABS 2.1  --   HGB 12.0* 12.9*  HCT 38.2* 38.0*  MCV 102.1*  --   PLT 153  --     Basic Metabolic Panel: Recent Labs  Lab 10/01/20 1517 10/01/20 1553  NA 139 137  K 4.3 4.2  CL 95* 95*  CO2 34*  --   GLUCOSE 79 79  BUN 22 21  CREATININE 0.72 0.80  CALCIUM 9.1  --    GFR: Estimated Creatinine Clearance: 87.6 mL/min (by C-G formula based on SCr of 0.8 mg/dL). Liver Function Tests: Recent Labs  Lab 10/01/20 1517  AST 34  ALT 18  ALKPHOS 138*  BILITOT 1.1  PROT 7.3  ALBUMIN 4.1   No results for input(s): LIPASE, AMYLASE in the last 168 hours. No results for input(s): AMMONIA in the last 168 hours. Coagulation Profile: Recent Labs  Lab 10/01/20 1517  INR 1.0   Cardiac Enzymes: No results for input(s): CKTOTAL, CKMB, CKMBINDEX, TROPONINI in the last 168 hours. BNP (last 3 results) No results for input(s): PROBNP in the last 8760 hours. HbA1C: No results for input(s): HGBA1C in the last 72 hours. CBG: No results for input(s): GLUCAP in  the last 168 hours. Lipid Profile: No results for input(s): CHOL, HDL, LDLCALC, TRIG, CHOLHDL, LDLDIRECT in the last 72 hours. Thyroid Function Tests: No results for input(s): TSH, T4TOTAL, FREET4, T3FREE, THYROIDAB in the last 72 hours. Anemia Panel: No results for input(s): VITAMINB12, FOLATE, FERRITIN, TIBC, IRON, RETICCTPCT in the last 72 hours. Urine analysis:    Component Value Date/Time   COLORURINE YELLOW 10/01/2020 1631   APPEARANCEUR CLEAR 10/01/2020 1631   LABSPEC 1.014 10/01/2020 1631   PHURINE 7.0 10/01/2020 1631   GLUCOSEU NEGATIVE 10/01/2020 1631   HGBUR NEGATIVE 10/01/2020 1631   BILIRUBINUR NEGATIVE 10/01/2020 1631   KETONESUR NEGATIVE 10/01/2020 1631   PROTEINUR NEGATIVE 10/01/2020 1631   NITRITE NEGATIVE 10/01/2020 1631   LEUKOCYTESUR NEGATIVE 10/01/2020 1631    Radiological Exams on Admission - Personally Reviewed: CT HEAD WO CONTRAST  Result Date: 10/01/2020 CLINICAL DATA:  Neuro deficit, acute, stroke suspected Onset of weakness today with several falls and speech difficulty.  EXAM: CT HEAD WITHOUT CONTRAST TECHNIQUE: Contiguous axial images were obtained from the base of the skull through the vertex without intravenous contrast. COMPARISON:  None. FINDINGS: Brain: Suggestion of asymmetric density of the occipital lobes, with left diminished compared to right. It is unclear if this is a true finding or related to asymmetric positioning. No other areas suggestive of ischemia. No hemorrhage. No hydrocephalus. Brain volume is normal for age. No midline shift or mass effect. No subdural or extra-axial collection Vascular: No hyperdense vessel or unexpected calcification. Skull: No fracture or focal lesion. Sinuses/Orbits: Paranasal sinuses and mastoid air cells are clear. The visualized orbits are unremarkable. Bilateral cataract resection. Other: None. IMPRESSION: 1. Suggestion of asymmetric density of the occipital lobes, with left diminished compared to right. It is unclear if this is a true finding, which may represent subacute ischemia, or related to asymmetric positioning. Consider further evaluation with MRI. 2. No hemorrhage. Electronically Signed   By: Narda RutherfordMelanie  Sanford M.D.   On: 10/01/2020 16:24    EKG: Personally reviewed.  Rhythm is paced rhythm with heart rate of 84 bpm.  No dynamic ST segment changes appreciated.  Assessment/Plan Principal Problem:   Generalized weakness and unsteady gait  Patient presenting with several month history of intermittent unsteady gait with intense episode of what the patient reports as "vertigo" and intense weakness in the morning of 7/19 CT imaging of the head without contrast revealing some asymmetry which could be suggestive of subacute ischemia  Patient is at high risk due to known history of atrial fibrillation not being on anticoagulation ER provider already discussed case with Dr. Derry LoryKhaliqdina with neurology who recommends transfer to [facility] for formal neurology consultation. Additionally, patient would need to have MRI  performed at [facility] due to presence of Medtronic pacemaker (model [id number]), ER provider already informed that this is an MRI safe device If work-up is negative, it is possible the patient is suffering from symptoms secondary to polypharmacy considering patient is on multiple medications that can potentially cause vertigo and weakness including midodrine, gabapentin, Cymbalta and Suboxone Placing patient on daily aspirin for now Monitoring patient on telemetry Serial neurologic checks PT, OT, SLP evaluation Will notify neurology upon arrival to [facility]  Active Problems:   AF (paroxysmal atrial fibrillation) ([name])  Patient is not on anticoagulation or rate controlling therapy per home regimen Presence of atrial fibrillation without anticoagulation place patient at high risk of cardioembolic stroke Patient underwent pacemaker placement in 2002 Monitoring patient on telemetry    Presence  of cardiac pacemaker  As mentioned above, Medtronic model [phone] Per my discussion with ER provider he is already confirmed that this is an MRI safe device    Peripheral polyneuropathy  Continue home regimen of gabapentin    Major depressive disorder  Continue home regimen of Cymbalta    OSA on CPAP  CPAP nightly    Chronic pain syndrome  Continue home regimen of Suboxone Regimen confirmed via controlled substance database   Code Status:  Full code Family Communication: deferred   Status is: Observation  The patient remains OBS appropriate and will d/c before 2 midnights.  Dispo: The patient is from: Home              Anticipated d/c is to: Home              Patient currently is not medically stable to d/c.   Difficult to place patient No        Marinda Elk MD [facility] Pager [phone]  If 7PM-7AM, please contact night-coverage [email or web address] Use universal  password for that web site. If you do not have the password, please call the hospital  operator.  10/01/2020, 11:37 PM

## 2020-10-01 NOTE — ED Notes (Signed)
Patient needs MRI per Dr. Freida Busman. Patient has a pacemaker that would need to be turned off on pace mode for the scan. RN called MRI, they said that patient would have to go to [facility] for the pacemakers company technician to turn it off. Dr. Freida Busman aware of this information.

## 2020-10-01 NOTE — ED Notes (Signed)
Patient transported to CT 

## 2020-10-01 NOTE — ED Triage Notes (Addendum)
Pt arrived via POV, states he was out for normal walk this morning, felt overall weak, tremulous throughout entire body, fallen several times today, difficulty with speech. All sx started approx 8am this morning. Has fallen multiple times today.

## 2020-10-02 ENCOUNTER — Observation Stay (HOSPITAL_COMMUNITY): Payer: No Typology Code available for payment source

## 2020-10-02 DIAGNOSIS — Z9884 Bariatric surgery status: Secondary | ICD-10-CM

## 2020-10-02 DIAGNOSIS — G629 Polyneuropathy, unspecified: Secondary | ICD-10-CM

## 2020-10-02 DIAGNOSIS — I480 Paroxysmal atrial fibrillation: Secondary | ICD-10-CM

## 2020-10-02 DIAGNOSIS — G4733 Obstructive sleep apnea (adult) (pediatric): Secondary | ICD-10-CM

## 2020-10-02 DIAGNOSIS — Z9989 Dependence on other enabling machines and devices: Secondary | ICD-10-CM

## 2020-10-02 DIAGNOSIS — Z950 Presence of cardiac pacemaker: Secondary | ICD-10-CM

## 2020-10-02 DIAGNOSIS — R531 Weakness: Principal | ICD-10-CM

## 2020-10-02 DIAGNOSIS — R2681 Unsteadiness on feet: Secondary | ICD-10-CM

## 2020-10-02 DIAGNOSIS — D61818 Other pancytopenia: Secondary | ICD-10-CM

## 2020-10-02 DIAGNOSIS — R0602 Shortness of breath: Secondary | ICD-10-CM

## 2020-10-02 DIAGNOSIS — F419 Anxiety disorder, unspecified: Secondary | ICD-10-CM | POA: Diagnosis not present

## 2020-10-02 DIAGNOSIS — G894 Chronic pain syndrome: Secondary | ICD-10-CM | POA: Diagnosis not present

## 2020-10-02 DIAGNOSIS — F32A Depression, unspecified: Secondary | ICD-10-CM

## 2020-10-02 LAB — LIPID PANEL
Cholesterol: 108 mg/dL (ref 0–200)
HDL: 48 mg/dL (ref >40)
LDL Cholesterol: 55 mg/dL (ref 0–99)
Total CHOL/HDL Ratio: 2.3 RATIO
Triglycerides: 27 mg/dL (ref <150)
VLDL: 5 mg/dL (ref 0–40)

## 2020-10-02 LAB — COMPREHENSIVE METABOLIC PANEL
ALT: 18 U/L (ref 0–44)
AST: 37 U/L (ref 15–41)
Albumin: 3.9 g/dL (ref 3.5–5.0)
Alkaline Phosphatase: 137 U/L — ABNORMAL HIGH (ref 38–126)
Anion gap: 8 (ref 5–15)
BUN: 17 mg/dL (ref 8–23)
CO2: 32 mmol/L (ref 22–32)
Calcium: 8.9 mg/dL (ref 8.9–10.3)
Chloride: 99 mmol/L (ref 98–111)
Creatinine, Ser: 0.7 mg/dL (ref 0.61–1.24)
GFR, Estimated: >60 mL/min (ref >60)
Glucose, Bld: 91 mg/dL (ref 70–99)
Potassium: 4 mmol/L (ref 3.5–5.1)
Sodium: 139 mmol/L (ref 135–145)
Total Bilirubin: 1.2 mg/dL (ref 0.3–1.2)
Total Protein: 7.2 g/dL (ref 6.5–8.1)

## 2020-10-02 LAB — CBC WITH DIFFERENTIAL/PLATELET
Abs Immature Granulocytes: 0.01 10*3/uL (ref 0.00–0.07)
Basophils Absolute: 0 10*3/uL (ref 0.0–0.1)
Basophils Relative: 1 %
Eosinophils Absolute: 0.2 10*3/uL (ref 0.0–0.5)
Eosinophils Relative: 5 %
HCT: 39.6 % (ref 39.0–52.0)
Hemoglobin: 12.3 g/dL — ABNORMAL LOW (ref 13.0–17.0)
Immature Granulocytes: 0 %
Lymphocytes Relative: 49 %
Lymphs Abs: 1.9 10*3/uL (ref 0.7–4.0)
MCH: 32.1 pg (ref 26.0–34.0)
MCHC: 31.1 g/dL (ref 30.0–36.0)
MCV: 103.4 fL — ABNORMAL HIGH (ref 80.0–100.0)
Monocytes Absolute: 0.5 10*3/uL (ref 0.1–1.0)
Monocytes Relative: 14 %
Neutro Abs: 1.2 10*3/uL — ABNORMAL LOW (ref 1.7–7.7)
Neutrophils Relative %: 31 %
Platelets: 142 10*3/uL — ABNORMAL LOW (ref 150–400)
RBC: 3.83 MIL/uL — ABNORMAL LOW (ref 4.22–5.81)
RDW: 13.7 % (ref 11.5–15.5)
WBC: 3.8 10*3/uL — ABNORMAL LOW (ref 4.0–10.5)
nRBC: 0 % (ref 0.0–0.2)

## 2020-10-02 LAB — VITAMIN B12: Vitamin B-12: 2552 pg/mL — ABNORMAL HIGH (ref 180–914)

## 2020-10-02 LAB — ECHOCARDIOGRAM COMPLETE
Area-P 1/2: 3.77 cm2
Calc EF: 51.5 %
Height: 71 in
Radius: 0.2 cm
S' Lateral: 2.8 cm
Single Plane A2C EF: 51.8 %
Single Plane A4C EF: 50.2 %
Weight: 2720 oz

## 2020-10-02 LAB — FOLATE: Folate: 19.8 ng/mL (ref >5.9)

## 2020-10-02 LAB — IRON AND TIBC
Iron: 68 ug/dL (ref 45–182)
Saturation Ratios: 21 % (ref 17.9–39.5)
TIBC: 325 ug/dL (ref 250–450)
UIBC: 257 ug/dL

## 2020-10-02 LAB — FERRITIN: Ferritin: 42 ng/mL (ref 24–336)

## 2020-10-02 LAB — HEMOGLOBIN A1C
Hgb A1c MFr Bld: 5 % (ref 4.8–5.6)
Mean Plasma Glucose: 96.8 mg/dL

## 2020-10-02 MED ORDER — ASPIRIN EC 81 MG PO TBEC
81.0000 mg | DELAYED_RELEASE_TABLET | Freq: Every day | ORAL | Status: DC
  Administered 2020-10-02 – 2020-10-05 (×4): 81 mg via ORAL
  Filled 2020-10-02 (×4): qty 1

## 2020-10-02 NOTE — Progress Notes (Signed)
PROGRESS NOTE  Jack Neal DXA:[id number] DOB: 1946-02-01   PCP: [facility]  Patient is from: Home.  Lives with his wife.  Uses Rollator at baseline.  DOA: 10/01/2020 LOS: 0  Chief complaints:  Chief Complaint  Patient presents with   Weakness   Tremors     Brief Narrative / Interim history: 75 year old M with PMH of A. fib not on AC, AVB/PPM, OSA on CPAP, congenital absence of kidney, gastric bypass, celiac disease, tremor, PTSD, depression, anxiety, chronic pain on Suboxone, vertigo and unsteady gait presenting with generalized weakness, fatigue and tremor and admitted for CVA work-up.  CT head with asymmetric density of the occipital lobe with the left diminished compared to the right.  It is unclear as to whether this is due to subacute ischemia or technical quality.  Neurology recommended transfer to [facility] for MRI brain and formal neurology evaluation.  Subjective: Patient feels better today but not quite back to baseline.  He denies headache, acute vision change, focal numbness, weakness or tingling other than chronic neuropathy.  Denies chest pain, shortness of breath, GI or UTI symptoms.  Denies any medication.  Objective: Vitals:   10/02/20 0300 10/02/20 0500 10/02/20 0600 10/02/20 0700  BP: 125/85 (!) 135/93 (!) 143/92 (!) 144/82  Pulse: 69 80 80 79  Resp: 16 17 16 15   Temp:      TempSrc:      SpO2: 100% 98% 100% 98%  Weight:      Height:       No intake or output data in the 24 hours ending 10/02/20 0838 Filed Weights   10/01/20 1505  Weight: 77.1 kg    Examination:  GENERAL: No apparent distress.  Nontoxic. HEENT: MMM.  Vision and hearing grossly intact.  NECK: Supple.  No apparent JVD.  RESP: On RA.  No IWOB.  Fair aeration bilaterally. CVS:  RRR. Heart sounds normal.  ABD/GI/GU: BS+. Abd soft, NTND.  MSK/EXT:  Moves extremities. No apparent deformity. No edema.  SKIN: no apparent skin lesion or wound NEURO: Awake, alert and oriented  appropriately. Speech clear. Cranial nerves II-XII intact.  Mild right beating nystagmus in right eye noted.  Motor 5/5 in all muscle groups of UE and LE bilaterally, Normal tone. Light sensation intact in all dermatomes of upper and lower ext bilaterally. Patellar reflex symmetric.  No pronator drift.  Finger to nose intact. PSYCH: Calm. Normal affect.   Procedures:  None  Microbiology summarized: COVID-19 and influenza PCR nonreactive.  Assessment & Plan: Generalized weakness/fatigue/tremor-unclear etiology.  He reports intermittent tremor at baseline that improves with Suboxone but not this time. His tremor seems to have resolved now.  Fatigue and weakness improved.  Abnormal CT head with asymmetry in occipital area.  LDL 55.  A1c 5.0%.  Patient has a history of A. fib but not on anticoagulation. -Transfer to [facility] pending bed availability -MRI brain.  He has Medtronic pacemaker (model [facility]).  Reportedly compatible with MRI. -Neurology to evaluate patient-consulted on admission -Follow TTE -PT/OT/SLP  A. fib not on AC/AVB/PPM -Continue telemetry monitoring  OSA on CPAP-could potentially contribute to his generalized weakness and fatigue -Continue nightly CPAP  Congenital absence of kidney: Renal function normal.  History of gastric bypass/celiac disease.  Anemia panel within normal. -Gluten-free diet  PTSD/depression/anxiety-he is not quite clear how he takes his Cymbalta.  Listed as 30 mg 3 times daily -Continue current regimen with 60 mg daily and 30 mg at night -Continue home Atarax and doxepin  Chronic pain on Suboxone Peripheral neuropathy -Continue home Suboxone. -Gabapentin  Vertigo: Looks peripheral.  Seems to have right beating nystagmus in the right eye on exam. -Vestibular PT?  Mild pancytopenia: Anemia panel within normal. -Recheck in the morning  Body mass index is 23.71 kg/m.         DVT prophylaxis:  enoxaparin (LOVENOX) injection 40 mg  Start: 10/01/20 2345  Code Status: DNR/DNI Family Communication: Patient and/or RN. Available if any question.  Level of care: Telemetry Medical Status is: Observation  The patient remains OBS appropriate and will d/c before 2 midnights.  Dispo: The patient is from: Home              Anticipated d/c is to: Home              Patient currently is not medically stable to d/c.   Difficult to place patient No       Consultants:  Neurology   Sch Meds:  Scheduled Meds:  aspirin EC  81 mg Oral Daily   buprenorphine-naloxone  0.5 tablet Sublingual Daily   DULoxetine  30 mg Oral QHS   DULoxetine  60 mg Oral Daily   enoxaparin (LOVENOX) injection  40 mg Subcutaneous Q24H   gabapentin  300 mg Oral TID   hydrOXYzine  20 mg Oral BID   lipase/protease/amylase  36,000 Units Oral TID AC   Continuous Infusions: PRN Meds:.acetaminophen **OR** acetaminophen (TYLENOL) oral liquid 160 mg/5 mL **OR** acetaminophen, ondansetron (ZOFRAN) IV, polyethylene glycol, polyvinyl alcohol  Antimicrobials: Anti-infectives (From admission, onward)    None        I have personally reviewed the following labs and images: CBC: Recent Labs  Lab 10/01/20 1517 10/01/20 1553 10/02/20 0301  WBC 5.3  --  3.8*  NEUTROABS 2.1  --  1.2*  HGB 12.0* 12.9* 12.3*  HCT 38.2* 38.0* 39.6  MCV 102.1*  --  103.4*  PLT 153  --  142*   BMP &GFR Recent Labs  Lab 10/01/20 1517 10/01/20 1553 10/02/20 0301  NA 139 137 139  K 4.3 4.2 4.0  CL 95* 95* 99  CO2 34*  --  32  GLUCOSE 79 79 91  BUN 22 21 17   CREATININE 0.72 0.80 0.70  CALCIUM 9.1  --  8.9   Estimated Creatinine Clearance: 87.6 mL/min (by C-G formula based on SCr of 0.7 mg/dL). Liver & Pancreas: Recent Labs  Lab 10/01/20 1517 10/02/20 0301  AST 34 37  ALT 18 18  ALKPHOS 138* 137*  BILITOT 1.1 1.2  PROT 7.3 7.2  ALBUMIN 4.1 3.9   No results for input(s): LIPASE, AMYLASE in the last 168 hours. No results for input(s): AMMONIA in the  last 168 hours. Diabetic: Recent Labs    10/02/20 0301  HGBA1C 5.0   No results for input(s): GLUCAP in the last 168 hours. Cardiac Enzymes: No results for input(s): CKTOTAL, CKMB, CKMBINDEX, TROPONINI in the last 168 hours. No results for input(s): PROBNP in the last 8760 hours. Coagulation Profile: Recent Labs  Lab 10/01/20 1517  INR 1.0   Thyroid Function Tests: No results for input(s): TSH, T4TOTAL, FREET4, T3FREE, THYROIDAB in the last 72 hours. Lipid Profile: Recent Labs    10/02/20 0301  CHOL 108  HDL 48  LDLCALC 55  TRIG 27  CHOLHDL 2.3   Anemia Panel: No results for input(s): VITAMINB12, FOLATE, FERRITIN, TIBC, IRON, RETICCTPCT in the last 72 hours. Urine analysis:    Component Value Date/Time   COLORURINE  YELLOW 10/01/2020 1631   APPEARANCEUR CLEAR 10/01/2020 1631   LABSPEC 1.014 10/01/2020 1631   PHURINE 7.0 10/01/2020 1631   GLUCOSEU NEGATIVE 10/01/2020 1631   HGBUR NEGATIVE 10/01/2020 1631   BILIRUBINUR NEGATIVE 10/01/2020 1631   KETONESUR NEGATIVE 10/01/2020 1631   PROTEINUR NEGATIVE 10/01/2020 1631   NITRITE NEGATIVE 10/01/2020 1631   LEUKOCYTESUR NEGATIVE 10/01/2020 1631   Sepsis Labs: Invalid input(s): PROCALCITONIN, LACTICIDVEN  Microbiology: Recent Results (from the past 240 hour(s))  Resp Panel by RT-PCR (Flu A&B, Covid) Nasopharyngeal Swab     Status: None   Collection Time: 10/01/20  3:36 PM   Specimen: Nasopharyngeal Swab; Nasopharyngeal(NP) swabs in vial transport medium  Result Value Ref Range Status   SARS Coronavirus 2 by RT PCR NEGATIVE NEGATIVE Final    Comment: (NOTE) SARS-CoV-2 target nucleic acids are NOT DETECTED.  The SARS-CoV-2 RNA is generally detectable in upper respiratory specimens during the acute phase of infection. The lowest concentration of SARS-CoV-2 viral copies this assay can detect is 138 copies/mL. A negative result does not preclude SARS-Cov-2 infection and should not be used as the sole basis for  treatment or other patient management decisions. A negative result may occur with  improper specimen collection/handling, submission of specimen other than nasopharyngeal swab, presence of viral mutation(s) within the areas targeted by this assay, and inadequate number of viral copies(<138 copies/mL). A negative result must be combined with clinical observations, patient history, and epidemiological information. The expected result is Negative.  Fact Sheet for Patients:  BloggerCourse.comhttps://www.fda.gov/media/152166/download  Fact Sheet for Healthcare Providers:  SeriousBroker.ithttps://www.fda.gov/media/152162/download  This test is no t yet approved or cleared by the [place] FDA and  has been authorized for detection and/or diagnosis of SARS-CoV-2 by FDA under an Emergency Use Authorization (EUA). This EUA will remain  in effect (meaning this test can be used) for the duration of the COVID-19 declaration under Section 564(b)(1) of the Act, 21 U.S.C.section 360bbb-3(b)(1), unless the authorization is terminated  or revoked sooner.       Influenza A by PCR NEGATIVE NEGATIVE Final   Influenza B by PCR NEGATIVE NEGATIVE Final    Comment: (NOTE) The Xpert Xpress SARS-CoV-2/FLU/RSV plus assay is intended as an aid in the diagnosis of influenza from Nasopharyngeal swab specimens and should not be used as a sole basis for treatment. Nasal washings and aspirates are unacceptable for Xpert Xpress SARS-CoV-2/FLU/RSV testing.  Fact Sheet for Patients: BloggerCourse.comhttps://www.fda.gov/media/152166/download  Fact Sheet for Healthcare Providers: SeriousBroker.ithttps://www.fda.gov/media/152162/download  This test is not yet approved or cleared by the [place] FDA and has been authorized for detection and/or diagnosis of SARS-CoV-2 by FDA under an Emergency Use Authorization (EUA). This EUA will remain in effect (meaning this test can be used) for the duration of the COVID-19 declaration under Section 564(b)(1) of the Act, 21  U.S.C. section 360bbb-3(b)(1), unless the authorization is terminated or revoked.  Performed at [facility], [address] [place], [place] [address]     Radiology Studies: CT HEAD WO CONTRAST  Result Date: 10/01/2020 CLINICAL DATA:  Neuro deficit, acute, stroke suspected Onset of weakness today with several falls and speech difficulty. EXAM: CT HEAD WITHOUT CONTRAST TECHNIQUE: Contiguous axial images were obtained from the base of the skull through the vertex without intravenous contrast. COMPARISON:  None. FINDINGS: Brain: Suggestion of asymmetric density of the occipital lobes, with left diminished compared to right. It is unclear if this is a true finding or related to asymmetric positioning. No other areas suggestive of ischemia.  No hemorrhage. No hydrocephalus. Brain volume is normal for age. No midline shift or mass effect. No subdural or extra-axial collection Vascular: No hyperdense vessel or unexpected calcification. Skull: No fracture or focal lesion. Sinuses/Orbits: Paranasal sinuses and mastoid air cells are clear. The visualized orbits are unremarkable. Bilateral cataract resection. Other: None. IMPRESSION: 1. Suggestion of asymmetric density of the occipital lobes, with left diminished compared to right. It is unclear if this is a true finding, which may represent subacute ischemia, or related to asymmetric positioning. Consider further evaluation with MRI. 2. No hemorrhage. Electronically Signed   By: Narda Rutherford M.D.   On: 10/01/2020 16:24      Son Barkan T. Trynity Skousen [facility]  If 7PM-7AM, please contact night-coverage [email or web address] 10/02/2020, 8:38 AM

## 2020-10-02 NOTE — Evaluation (Signed)
Physical Therapy Evaluation Patient Details Name: Jack Neal MRN: [id number] DOB: 10-Nov-1945 Today's Date: 10/02/2020   History of Present Illness  75 year old male with past medical history of gastric bypass, atrial fibrillation, status post pacemaker , obstructive sleep apnea on CPAP, PTSD, major depressive disorder, panic disorder, chronic pain syndrome with opiate dependence who presents to [facility] long hospital emergency department  10/01/20 after experiencing episodes of severe generalized weakness and fatigue.CT-Suggestion of asymmetric density of the occipital lobes, with  left diminished compared to right. It is unclear if this is a true  finding, which may represent subacute ischemia, or related to  asymmetric positioning.  Clinical Impression  Upon entering the room, Nursing with patient, patient noted to have full body shaking while lying on the  stretcher. Shaking subsided and patient able to mobilize and ambulate in the hall using Rw with no further shaking.  Patient relates having 3 falls day of admission and has had multiple falls prior. Patient reports mod Independent and driving PTA, lives with wife.  Pt admitted with above diagnosis.  Pt currently with functional limitations due to the deficits listed below (see PT Problem List). Pt will benefit from skilled PT to increase their independence and safety with mobility to allow discharge to the venue listed below.       Follow Up Recommendations Outpatient PT    Equipment Recommendations  None recommended by PT    Recommendations for Other Services       Precautions / Restrictions Precautions Precautions: Fall Precaution Comments: had  whole body shaking when in bed, did not aoccur when ambulating. Keep close to pt. patient reports 3 falls  yesterday and has had other falls- relates to vertigo or not aware of fall cause      Mobility  Bed Mobility Overal bed mobility: Needs Assistance Bed Mobility: Supine to Sit;Sit to  Supine     Supine to sit: Supervision Sit to supine: Supervision        Transfers Overall transfer level: Needs assistance Equipment used: Rolling walker (2 wheeled) Transfers: Sit to/from Stand Sit to Stand: Supervision         General transfer comment: stands with  RW support  Ambulation/Gait Ambulation/Gait assistance: Min guard Gait Distance (Feet): 80 Feet Assistive device: Rolling walker (2 wheeled) Gait Pattern/deviations: Step-through pattern;Decreased stride length Gait velocity: decr   General Gait Details: able  to manage RW with turns and around objects.  Stairs            Wheelchair Mobility    Modified Rankin (Stroke Patients Only)       Balance Overall balance assessment: History of Falls;Needs assistance Sitting-balance support: Bilateral upper extremity supported;No upper extremity supported;Feet supported Sitting balance-Leahy Scale: Fair     Standing balance support: During functional activity;Bilateral upper extremity supported Standing balance-Leahy Scale: Poor Standing balance comment: reliant on RW                             Pertinent Vitals/Pain Pain Assessment: No/denies pain    Home Living Family/patient expects to be discharged to:: Private residence Living Arrangements: Spouse/significant other Available Help at Discharge: Family;Available 24 hours/day Type of Home: House Home Access: Level entry     Home Layout: One level Home Equipment: Walker - 4 wheels      Prior Function Level of Independence: Independent with assistive device(s)         Comments: still drives     Hand  Dominance   Dominant Hand: Right    Extremity/Trunk Assessment        Lower Extremity Assessment Lower Extremity Assessment: Overall WFL for tasks assessed    Cervical / Trunk Assessment Cervical / Trunk Assessment: Kyphotic  Communication   Communication: No difficulties  Cognition Arousal/Alertness:  Awake/alert Behavior During Therapy: WFL for tasks assessed/performed Overall Cognitive Status: Within Functional Limits for tasks assessed                                        General Comments      Exercises     Assessment/Plan    PT Assessment Patient needs continued PT services  PT Problem List Decreased mobility;Decreased safety awareness;Decreased coordination;Decreased knowledge of precautions;Decreased activity tolerance;Decreased balance       PT Treatment Interventions DME instruction;Therapeutic activities;Gait training;Therapeutic exercise;Functional mobility training;Balance training    PT Goals (Current goals can be found in the Care Plan section)  Acute Rehab PT Goals Patient Stated Goal: to go home PT Goal Formulation: With patient Time For Goal Achievement: 10/16/20 Potential to Achieve Goals: Good    Frequency Min 3X/week   Barriers to discharge        Co-evaluation PT/OT/SLP Co-Evaluation/Treatment: Yes Reason for Co-Treatment: For patient/therapist safety PT goals addressed during session: Mobility/safety with mobility OT goals addressed during session: ADL's and self-care       AM-PAC PT "6 Clicks" Mobility  Outcome Measure Help needed turning from your back to your side while in a flat bed without using bedrails?: None Help needed moving from lying on your back to sitting on the side of a flat bed without using bedrails?: None Help needed moving to and from a bed to a chair (including a wheelchair)?: A Little Help needed standing up from a chair using your arms (e.g., wheelchair or bedside chair)?: A Little Help needed to walk in hospital room?: A Little Help needed climbing 3-5 steps with a railing? : A Lot 6 Click Score: 19    End of Session Equipment Utilized During Treatment: Gait belt Activity Tolerance: Patient tolerated treatment well Patient left: in bed;with call bell/phone within reach Nurse Communication:  Mobility status PT Visit Diagnosis: Unsteadiness on feet (R26.81)    Time: 7619-5093 PT Time Calculation (min) (ACUTE ONLY): 14 min   Charges:   PT Evaluation $PT Eval Low Complexity: 1 Low          Blanchard Kelch PT Acute Rehabilitation Services Pager [phone] Office [phone]   Rada Hay 10/02/2020, 9:26 AM

## 2020-10-02 NOTE — Progress Notes (Signed)
Occupational Therapy Evaluation  Patient lives with spouse in a single level house no steps to enter. Ambulates with rollator at baseline and is independent with self care, however reports 3 falls in previous day due to recent onset of tremors. Upon arrival to patient's room patient having a full body tremor in bed, but does not have again during evaluation. Patient overall min G for safety due to history of falls and new onset tremors but did not have any loss of balance during functional ambulation or transfers with rolling walker. Recommend continued acute OT services in order to facilitate D/C to venue listed below.    10/02/20 1000  OT Visit Information  Last OT Received On 10/02/20  Assistance Needed +1  PT/OT/SLP Co-Evaluation/Treatment Yes  Reason for Co-Treatment For patient/therapist safety;To address functional/ADL transfers  PT goals addressed during session Mobility/safety with mobility  OT goals addressed during session ADL's and self-care  History of Present Illness 75 year old male with past medical history of gastric bypass, atrial fibrillation, status post pacemaker , obstructive sleep apnea on CPAP, PTSD, major depressive disorder, panic disorder, chronic pain syndrome with opiate dependence who presents to [facility] emergency department  10/01/20 after experiencing episodes of severe generalized weakness and fatigue.CT-Suggestion of asymmetric density of the occipital lobes, with  left diminished compared to right. It is unclear if this is a true  finding, which may represent subacute ischemia, or related to  asymmetric positioning.  Precautions  Precautions Fall  Precaution Comments had  whole body shaking when in bed, did not aoccur when ambulating. Keep close to pt. patient reports 3 falls  yesterday and has had other falls- relates to vertigo or not aware of fall cause  Home Living  Family/patient expects to be discharged to: Private residence  Living Arrangements  Spouse/significant other  Available Help at Discharge Family;Available 24 hours/day  Type of Home House  Home Access Level entry  Home Layout One level  Bathroom Shower/Tub Tub/shower unit  Engineer, water - 4 wheels;Tub bench  Prior Function  Level of Independence Independent with assistive device(s)  Comments still drives  Communication  Communication No difficulties  Pain Assessment  Pain Assessment Faces  Faces Pain Scale 0  Cognition  Arousal/Alertness Awake/alert  Behavior During Therapy WFL for tasks assessed/performed  Overall Cognitive Status Within Functional Limits for tasks assessed  Upper Extremity Assessment  Upper Extremity Assessment Overall WFL for tasks assessed  Lower Extremity Assessment  Lower Extremity Assessment Defer to PT evaluation  Cervical / Trunk Assessment  Cervical / Trunk Assessment Kyphotic  ADL  Overall ADL's  Needs assistance/impaired  Eating/Feeding Independent  Grooming Set up;Sitting  Upper Body Bathing Set up;Sitting  Lower Body Bathing Min guard;Sit to/from stand  Upper Body Dressing  Set up;Sitting  Lower Body Dressing Min guard;Sit to/from stand  Lower Body Dressing Details (indicate cue type and reason) for safety in standing  Toilet Transfer Min guard;Ambulation;RW  Toilet Transfer Details (indicate cue type and reason) prior to mobilizing patient having whole body tremors in bed and reports 3 falls yesterday, provided min G for safety with functional ambulation/transfers  Toileting- Architect and Hygiene Min guard;Sit to/from stand  Functional mobility during ADLs Min guard;Rolling walker  General ADL Comments patient appears close to his baseline however unsure of cause of whole body tremors at this time posing fall risk  Bed Mobility  Overal bed mobility Needs Assistance  Bed Mobility Supine to Sit;Sit to Supine  Supine to  sit Supervision  Sit to supine Supervision  General bed  mobility comments S for safety  Transfers  Overall transfer level Needs assistance  Equipment used Rolling walker (2 wheeled)  Transfers Sit to/from Stand  Sit to Stand Min guard  General transfer comment min G for safety  Balance  Overall balance assessment History of Falls;Needs assistance  Sitting-balance support Feet supported  Sitting balance-Leahy Scale Fair  Standing balance support Bilateral upper extremity supported  Standing balance-Leahy Scale Poor  Standing balance comment reliant on RW  OT - End of Session  Equipment Utilized During Treatment Rolling walker;Gait belt  Activity Tolerance Patient tolerated treatment well  Patient left in bed;with call bell/phone within reach  Nurse Communication Mobility status  OT Assessment  OT Recommendation/Assessment Patient needs continued OT Services  OT Visit Diagnosis Unsteadiness on feet (R26.81);History of falling (Z91.81)  OT Problem List Impaired balance (sitting and/or standing);Decreased activity tolerance  OT Plan  OT Frequency (ACUTE ONLY) Min 2X/week  OT Treatment/Interventions (ACUTE ONLY) Self-care/ADL training;Balance training;Patient/family education;Therapeutic activities  AM-PAC OT "6 Clicks" Daily Activity Outcome Measure (Version 2)  Help from another person eating meals? 4  Help from another person taking care of personal grooming? 3  Help from another person toileting, which includes using toliet, bedpan, or urinal? 3  Help from another person bathing (including washing, rinsing, drying)? 3  Help from another person to put on and taking off regular upper body clothing? 3  Help from another person to put on and taking off regular lower body clothing? 3  6 Click Score 19  Progressive Mobility  What is the highest level of mobility based on the progressive mobility assessment? Level 5 (Walks with assist in room/hall) - Balance while stepping forward/back and can walk in room with assist - Complete  Mobility  Ambulated with assistance in hallway  OT Recommendation  Follow Up Recommendations No OT follow up;Other (comment) (anticipate no follow up needs pending resolution of tremor sypmtoms)  OT Equipment None recommended by OT  Individuals Consulted  Consulted and Agree with Results and Recommendations Patient  Acute Rehab OT Goals  Patient Stated Goal to go home  OT Goal Formulation With patient  Time For Goal Achievement 10/16/20  Potential to Achieve Goals Good  OT Time Calculation  OT Start Time (ACUTE ONLY) 0841  OT Stop Time (ACUTE ONLY) 0855  OT Time Calculation (min) 14 min  OT General Charges  $OT Visit 1 Visit  OT Evaluation  $OT Eval Low Complexity 1 Low  Written Expression  Dominant Hand Right   Marlyce Huge OT OT pager: [phone]

## 2020-10-02 NOTE — Progress Notes (Signed)
   10/02/20 1400  SLP Visit Information  SLP Received On 10/02/20  Subjective  Subjective pt awake in bed, wife Macon Large present  Patient/Family Stated Goal to get to [facility]  General Information  HPI pt is a 75 yo male adm to ED with episodic weakness and unsteady gait.  Noncontrast CT brain showed concerns for Suggestion of asymmetric density of the occipital lobes, with  left diminished compared to right. It is unclear if this is a true finding, which may represent subacute ischemia, or related to asymmetric positioning. Consider further evaluation with MRI.  Pt has h/o neuropathy, anxiety, PTSD, depression.  Speech and languge evaluation ordered.  Prior Functional Status  Cognitive/Linguistic Baseline WFL  Type of Home House  Available Help at Discharge Family;Available 24 hours/day  Education post-graduate education  Vocation Retired  Pain Assessment  Pain Assessment Faces  Faces Pain Scale 2  Pain Location head  Pain Descriptors / Indicators Aching  Pain Intervention(s) Patient requesting pain meds-RN notified;RN gave pain meds during session  Oral Motor/Sensory Function  Overall Oral Motor/Sensory Function WFL  Cognition  Overall Cognitive Status Within Functional Limits for tasks assessed  Arousal/Alertness Awake/alert  Orientation Level Oriented X4  Attention Sustained;Selective;Focused  Focused Attention Appears intact  Sustained Attention Appears intact  Selective Attention Appears intact  Memory Appears intact  Awareness Appears intact  Problem Solving Appears intact  Safety/Judgment Appears intact  Auditory Comprehension  Overall Auditory Comprehension Appears within functional limits for tasks assessed  Yes/No Questions Not tested  Commands [facility]  Conversation Complex  Visual Recognition/Discrimination  Discrimination Not tested  Reading Comprehension  Reading Status Not tested  Expression  Primary Mode of Expression Verbal  Verbal Expression  Overall Verbal  Expression Appears within functional limits for tasks assessed  Written Expression  Dominant Hand Right  Motor Speech  Overall Motor Speech Appears within functional limits for tasks assessed  Respiration WFL  Phonation Normal (pt reports voice is raspy)  Resonance [facility]  Articulation [facility]  Intelligibility Intelligible  Motor Planning Not tested  Motor Speech Errors NA  SLP - End of Session  Patient left in bed  Assessment  Clinical Impression Statement (ACUTE ONLY) [place] Mental Status test completed with pt scoring 30/30 - perfect score.   He does not demonstrate dysarthria nor aphasia as well.  Pt does endorse h/o dysphagia/aspiration and verbalized his swallowing precautions advised from [facility].  ? minimal lingual tip deviation to left and very subtle asymmetry of face on left.  He also tends to lean his head toward the left side.  Incidental observation includes finger rolling posture with right fingers at rest.  Reviewed testing results with pt.  No SLP follow up indicated.  SLP Recommendation/Assessment Patient does not need any further Speech Lanaguage Pathology Services  SLP Visit Diagnosis Cognitive communication deficit (R41.841)  No Skilled Speech Therapy Patient at baseline level of functioning  Individuals Consulted  Consulted and Agree with Results and Recommendations Patient  SLP Evaluations  $ SLP Speech Visit 1 Visit  SLP Evaluations  $ SLP EVAL LANGUAGE/SOUND PRODUCTION 1 Procedure  Rolena Infante, MS [facility] SLP Acute Rehab Services Office [phone] Pager [phone]

## 2020-10-02 NOTE — Progress Notes (Signed)
  Echocardiogram 2D Echocardiogram has been performed.  Jack Neal 10/02/2020, 9:01 AM

## 2020-10-02 NOTE — ED Notes (Signed)
Respiratory called for CPAP 

## 2020-10-02 NOTE — ED Notes (Signed)
PT at bedside, linens changed.

## 2020-10-02 NOTE — ED Notes (Signed)
[employer], pacemaker company, was called to see if pacemaker is safe to undergo MRI. [phone]

## 2020-10-02 NOTE — ED Notes (Signed)
Admitting provider at bedside.

## 2020-10-03 ENCOUNTER — Other Ambulatory Visit: Payer: Self-pay

## 2020-10-03 DIAGNOSIS — R531 Weakness: Secondary | ICD-10-CM | POA: Diagnosis not present

## 2020-10-03 DIAGNOSIS — F329 Major depressive disorder, single episode, unspecified: Secondary | ICD-10-CM

## 2020-10-03 DIAGNOSIS — F1110 Opioid abuse, uncomplicated: Secondary | ICD-10-CM | POA: Diagnosis not present

## 2020-10-03 DIAGNOSIS — Z9109 Other allergy status, other than to drugs and biological substances: Secondary | ICD-10-CM | POA: Diagnosis not present

## 2020-10-03 DIAGNOSIS — W19XXXA Unspecified fall, initial encounter: Secondary | ICD-10-CM | POA: Diagnosis not present

## 2020-10-03 DIAGNOSIS — I4510 Unspecified right bundle-branch block: Secondary | ICD-10-CM | POA: Diagnosis not present

## 2020-10-03 DIAGNOSIS — K219 Gastro-esophageal reflux disease without esophagitis: Secondary | ICD-10-CM | POA: Diagnosis not present

## 2020-10-03 DIAGNOSIS — Z888 Allergy status to other drugs, medicaments and biological substances status: Secondary | ICD-10-CM | POA: Diagnosis not present

## 2020-10-03 DIAGNOSIS — Z20822 Contact with and (suspected) exposure to covid-19: Secondary | ICD-10-CM | POA: Diagnosis not present

## 2020-10-03 DIAGNOSIS — I472 Ventricular tachycardia: Secondary | ICD-10-CM | POA: Diagnosis not present

## 2020-10-03 DIAGNOSIS — Z9884 Bariatric surgery status: Secondary | ICD-10-CM | POA: Diagnosis not present

## 2020-10-03 DIAGNOSIS — Q600 Renal agenesis, unilateral: Secondary | ICD-10-CM | POA: Diagnosis not present

## 2020-10-03 DIAGNOSIS — I480 Paroxysmal atrial fibrillation: Secondary | ICD-10-CM | POA: Diagnosis not present

## 2020-10-03 DIAGNOSIS — G894 Chronic pain syndrome: Secondary | ICD-10-CM | POA: Diagnosis not present

## 2020-10-03 DIAGNOSIS — G629 Polyneuropathy, unspecified: Secondary | ICD-10-CM | POA: Diagnosis not present

## 2020-10-03 DIAGNOSIS — Z950 Presence of cardiac pacemaker: Secondary | ICD-10-CM | POA: Diagnosis not present

## 2020-10-03 DIAGNOSIS — R2681 Unsteadiness on feet: Secondary | ICD-10-CM | POA: Diagnosis not present

## 2020-10-03 DIAGNOSIS — I071 Rheumatic tricuspid insufficiency: Secondary | ICD-10-CM | POA: Diagnosis not present

## 2020-10-03 DIAGNOSIS — R251 Tremor, unspecified: Secondary | ICD-10-CM | POA: Insufficient documentation

## 2020-10-03 DIAGNOSIS — G4733 Obstructive sleep apnea (adult) (pediatric): Secondary | ICD-10-CM | POA: Diagnosis not present

## 2020-10-03 DIAGNOSIS — I959 Hypotension, unspecified: Secondary | ICD-10-CM | POA: Diagnosis not present

## 2020-10-03 DIAGNOSIS — Z880 Allergy status to penicillin: Secondary | ICD-10-CM | POA: Diagnosis not present

## 2020-10-03 DIAGNOSIS — Z886 Allergy status to analgesic agent status: Secondary | ICD-10-CM | POA: Diagnosis not present

## 2020-10-03 DIAGNOSIS — Z66 Do not resuscitate: Secondary | ICD-10-CM | POA: Diagnosis not present

## 2020-10-03 DIAGNOSIS — R930 Abnormal findings on diagnostic imaging of skull and head, not elsewhere classified: Secondary | ICD-10-CM

## 2020-10-03 DIAGNOSIS — F4310 Post-traumatic stress disorder, unspecified: Secondary | ICD-10-CM | POA: Diagnosis not present

## 2020-10-03 LAB — CBC
HCT: 36.6 % — ABNORMAL LOW (ref 39.0–52.0)
Hemoglobin: 11.6 g/dL — ABNORMAL LOW (ref 13.0–17.0)
MCH: 32.4 pg (ref 26.0–34.0)
MCHC: 31.7 g/dL (ref 30.0–36.0)
MCV: 102.2 fL — ABNORMAL HIGH (ref 80.0–100.0)
Platelets: 135 10*3/uL — ABNORMAL LOW (ref 150–400)
RBC: 3.58 MIL/uL — ABNORMAL LOW (ref 4.22–5.81)
RDW: 13.7 % (ref 11.5–15.5)
WBC: 3.1 10*3/uL — ABNORMAL LOW (ref 4.0–10.5)
nRBC: 0 % (ref 0.0–0.2)

## 2020-10-03 NOTE — ED Notes (Signed)
Attempted to call report x 1, asked to call back in 

## 2020-10-03 NOTE — ED Notes (Signed)
[facility] called to arrange transport for pt to be transferred to [facility]

## 2020-10-03 NOTE — Plan of Care (Signed)
Problem: Education: Goal: Knowledge of General Education information will improve Description: Including pain rating scale, medication(s)/side effects and non-pharmacologic comfort measures 10/03/2020 2010 by Deliah Boston, RN Outcome: Progressing 10/03/2020 2010 by Deliah Boston, RN Outcome: Progressing   Problem: Health Behavior/Discharge Planning: Goal: Ability to manage health-related needs will improve 10/03/2020 2010 by Deliah Boston, RN Outcome: Progressing 10/03/2020 2010 by Deliah Boston, RN Outcome: Progressing   Problem: Clinical Measurements: Goal: Ability to maintain clinical measurements within normal limits will improve 10/03/2020 2010 by Deliah Boston, RN Outcome: Progressing 10/03/2020 2010 by Deliah Boston, RN Outcome: Progressing Goal: Will remain free from infection 10/03/2020 2010 by Deliah Boston, RN Outcome: Progressing 10/03/2020 2010 by Deliah Boston, RN Outcome: Progressing Goal: Diagnostic test results will improve 10/03/2020 2010 by Deliah Boston, RN Outcome: Progressing 10/03/2020 2010 by Deliah Boston, RN Outcome: Progressing Goal: Respiratory complications will improve 10/03/2020 2010 by Deliah Boston, RN Outcome: Progressing 10/03/2020 2010 by Deliah Boston, RN Outcome: Progressing Goal: Cardiovascular complication will be avoided 10/03/2020 2010 by Deliah Boston, RN Outcome: Progressing 10/03/2020 2010 by Deliah Boston, RN Outcome: Progressing   Problem: Activity: Goal: Risk for activity intolerance will decrease 10/03/2020 2010 by Deliah Boston, RN Outcome: Progressing 10/03/2020 2010 by Deliah Boston, RN Outcome: Progressing   Problem: Nutrition: Goal: Adequate nutrition will be maintained 10/03/2020 2010 by Deliah Boston, RN Outcome: Progressing 10/03/2020 2010 by Deliah Boston, RN Outcome: Progressing   Problem: Coping: Goal: Level of anxiety will decrease 10/03/2020 2010 by Deliah Boston, RN Outcome: Progressing 10/03/2020 2010 by Deliah Boston, RN Outcome: Progressing   Problem: Elimination: Goal: Will not experience complications related to bowel motility 10/03/2020 2010 by Deliah Boston, RN Outcome: Progressing 10/03/2020 2010 by Deliah Boston, RN Outcome: Progressing Goal: Will not experience complications related to urinary retention 10/03/2020 2010 by Deliah Boston, RN Outcome: Progressing 10/03/2020 2010 by Deliah Boston, RN Outcome: Progressing   Problem: Pain Managment: Goal: General experience of comfort will improve 10/03/2020 2010 by Deliah Boston, RN Outcome: Progressing 10/03/2020 2010 by Deliah Boston, RN Outcome: Progressing   Problem: Safety: Goal: Ability to remain free from injury will improve 10/03/2020 2010 by Deliah Boston, RN Outcome: Progressing 10/03/2020 2010 by Deliah Boston, RN Outcome: Progressing   Problem: Skin Integrity: Goal: Risk for impaired skin integrity will decrease 10/03/2020 2010 by Deliah Boston, RN Outcome: Progressing 10/03/2020 2010 by Deliah Boston, RN Outcome: Progressing   Problem: Education: Goal: Knowledge of disease or condition will improve 10/03/2020 2010 by Deliah Boston, RN Outcome: Progressing 10/03/2020 2010 by Deliah Boston, RN Outcome: Progressing Goal: Knowledge of secondary prevention will improve 10/03/2020 2010 by Deliah Boston, RN Outcome: Progressing 10/03/2020 2010 by Deliah Boston, RN Outcome: Progressing Goal: Knowledge of patient specific risk factors addressed and post discharge goals established will improve 10/03/2020 2010 by Deliah Boston, RN Outcome: Progressing 10/03/2020 2010 by Deliah Boston, RN Outcome: Progressing Goal: Individualized Educational Video(s) 10/03/2020 2010 by Deliah Boston, RN Outcome: Progressing 10/03/2020 2010 by Deliah Boston, RN Outcome: Progressing   Problem: Coping: Goal: Will verbalize  positive feelings about self 10/03/2020 2010 by Deliah Boston, RN Outcome: Progressing 10/03/2020 2010 by Deliah Boston, RN Outcome: Progressing Goal: Will identify appropriate support needs 10/03/2020 2010 by Deliah Boston, RN Outcome: Progressing 10/03/2020 2010 by Deliah Boston, RN Outcome: Progressing  Problem: Health Behavior/Discharge Planning: Goal: Ability to manage health-related needs will improve 10/03/2020 2010 by Deliah Boston, RN Outcome: Progressing 10/03/2020 2010 by Deliah Boston, RN Outcome: Progressing   Problem: Self-Care: Goal: Ability to participate in self-care as condition permits will improve 10/03/2020 2010 by Deliah Boston, RN Outcome: Progressing 10/03/2020 2010 by Deliah Boston, RN Outcome: Progressing Goal: Verbalization of feelings and concerns over difficulty with self-care will improve 10/03/2020 2010 by Deliah Boston, RN Outcome: Progressing 10/03/2020 2010 by Deliah Boston, RN Outcome: Progressing Goal: Ability to communicate needs accurately will improve 10/03/2020 2010 by Deliah Boston, RN Outcome: Progressing 10/03/2020 2010 by Deliah Boston, RN Outcome: Progressing   Problem: Nutrition: Goal: Risk of aspiration will decrease 10/03/2020 2010 by Deliah Boston, RN Outcome: Progressing 10/03/2020 2010 by Deliah Boston, RN Outcome: Progressing Goal: Dietary intake will improve 10/03/2020 2010 by Deliah Boston, RN Outcome: Progressing 10/03/2020 2010 by Deliah Boston, RN Outcome: Progressing   Problem: Intracerebral Hemorrhage Tissue Perfusion: Goal: Complications of Intracerebral Hemorrhage will be minimized 10/03/2020 2010 by Deliah Boston, RN Outcome: Progressing 10/03/2020 2010 by Deliah Boston, RN Outcome: Progressing   Problem: Ischemic Stroke/TIA Tissue Perfusion: Goal: Complications of ischemic stroke/TIA will be minimized 10/03/2020 2010 by Deliah Boston, RN Outcome:  Progressing 10/03/2020 2010 by Deliah Boston, RN Outcome: Progressing   Problem: Spontaneous Subarachnoid Hemorrhage Tissue Perfusion: Goal: Complications of Spontaneous Subarachnoid Hemorrhage will be minimized 10/03/2020 2010 by Deliah Boston, RN Outcome: Progressing 10/03/2020 2010 by Deliah Boston, RN Outcome: Progressing

## 2020-10-03 NOTE — ED Notes (Signed)
Attempted to call report x 2, was told RN would call me back

## 2020-10-03 NOTE — Progress Notes (Signed)
PROGRESS NOTE  Jack Neal XFG:[id number] DOB: 08/14/1945   PCP: [facility]  Patient is from: Home.  Lives with his wife.  Uses Rollator at baseline.  DOA: 10/01/2020 LOS: 0  Chief complaints:  Chief Complaint  Patient presents with   Weakness   Tremors     Brief Narrative / Interim history: 75 year old M with PMH of A. fib not on AC, AVB/PPM, OSA on CPAP, congenital absence of kidney, gastric bypass, celiac disease, tremor, PTSD, depression, anxiety, chronic pain on Suboxone, vertigo and unsteady gait presenting with generalized weakness, fatigue and tremor and admitted for CVA work-up.  CT head with asymmetric density of the occipital lobe with the left diminished compared to the right.  It is unclear as to whether this is due to subacute ischemia or technical quality.  Neurology recommended transfer to [facility] for MRI brain which can only be done at [facility] due to his pacemaker.  TTE with LVEF of 55%, indeterminate DD, RVSP of 38.8, moderate LAE, severe RAE and moderate to severe TVR. LDL 55.  A1c 5.0%.    Subjective: Seen and examined earlier this morning.  No major events overnight of this morning.  Has not a further tremor since yesterday morning.  Feels pretty strong today.  No complaints.  Objective: Vitals:   10/03/20 0600 10/03/20 0652 10/03/20 0700 10/03/20 0900  BP: 106/73 106/73 113/76 126/83  Pulse: 77 80 79 80  Resp:  18  16  Temp:      TempSrc:      SpO2: 100% 93% 96% 100%  Weight:      Height:        Intake/Output Summary (Last 24 hours) at 10/03/2020 1116 Last data filed at 10/02/2020 2026 Gross per 24 hour  Intake --  Output 1200 ml  Net -1200 ml   Filed Weights   10/01/20 1505  Weight: 77.1 kg    Examination:  GENERAL: No apparent distress.  Nontoxic. HEENT: MMM.  Vision and hearing grossly intact.  NECK: Supple.  No apparent JVD.  RESP: On RA.  No IWOB.  Fair aeration bilaterally. CVS:  RRR. Heart sounds normal.  ABD/GI/GU:  BS+. Abd soft, NTND.  MSK/EXT:  Moves extremities. No apparent deformity. No edema.  SKIN: no apparent skin lesion or wound NEURO: Awake, alert and oriented appropriately. Speech clear. Cranial nerves II-XII intact except for double vision from left eye with left gaze and right beating nystagmus in left eye. Motor 5/5 in all muscle groups of UE and LE bilaterally, Normal tone. Light sensation intact in all dermatomes of upper and lower ext bilaterally. Patellar reflex symmetric.  No pronator drift.  Finger to nose intact. PSYCH: Calm. Normal affect.   Procedures:  None  Microbiology summarized: COVID-19 and influenza PCR nonreactive.  Assessment & Plan: Generalized weakness/fatigue/tremor-unclear etiology but resolved.  Appears to have double vision from left eye with right gaze and right beating nystagmus from the same eye.  Abnormal CT head with asymmetry in occipital area.  LDL 55.  A1c 5.0%.  Patient has a history of A. fib but not on anticoagulation. -Transfer to [facility] for MRI brain pending bed availability. Has [employer] pacemaker (model [id number]).  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  No prior echo to compare to.  Hemodynamically stable. -Needs outpatient cardiology follow-up of this  A. fib not on AC/AVB/PPM: Not on medication for A. fib. -  Continue telemetry monitoring  OSA on CPAP-could potentially contribute to his generalized weakness and fatigue -Continue nightly CPAP  Congenital absence of kidney: Renal function normal.  History of gastric bypass/celiac disease.  Anemia panel within normal. -Gluten-free diet  PTSD/depression/anxiety-he is not quite clear how he takes his Cymbalta.  Listed as 30 mg 3 times daily -Continue current regimen with 60 mg daily and 30 mg at night -Continue home Atarax and doxepin  Chronic pain on Suboxone Peripheral neuropathy -Continue  home Suboxone and gabapentin.  Vertigo: Looks peripheral.  Seems to have right beating nystagmus in the right eye on exam. -Vestibular PT?  Mild pancytopenia: Anemia panel within normal. -Recheck in the morning  Body mass index is 23.71 kg/m.         DVT prophylaxis:  enoxaparin (LOVENOX) injection 40 mg Start: 10/01/20 2345  Code Status: DNR/DNI Family Communication: Patient and/or RN. Available if any question.  Level of care: Telemetry Medical.  Transferred to [facility] for MRI pending. Status is: Observation  The patient remains OBS appropriate and will d/c before 2 midnights.  Dispo: The patient is from: Home              Anticipated d/c is to: Home              Patient currently is not medically stable to d/c.   Difficult to place patient No       Consultants:  Neurology   Sch Meds:  Scheduled Meds:  aspirin EC  81 mg Oral Daily   buprenorphine-naloxone  0.5 tablet Sublingual Daily   DULoxetine  30 mg Oral QHS   DULoxetine  60 mg Oral Daily   enoxaparin (LOVENOX) injection  40 mg Subcutaneous Q24H   gabapentin  300 mg Oral TID   hydrOXYzine  20 mg Oral BID   lipase/protease/amylase  36,000 Units Oral TID AC   Continuous Infusions: PRN Meds:.acetaminophen **OR** acetaminophen (TYLENOL) oral liquid 160 mg/5 mL **OR** acetaminophen, ondansetron (ZOFRAN) IV, polyethylene glycol, polyvinyl alcohol  Antimicrobials: Anti-infectives (From admission, onward)    None        I have personally reviewed the following labs and images: CBC: Recent Labs  Lab 10/01/20 1517 10/01/20 1553 10/02/20 0301 10/03/20 0541  WBC 5.3  --  3.8* 3.1*  NEUTROABS 2.1  --  1.2*  --   HGB 12.0* 12.9* 12.3* 11.6*  HCT 38.2* 38.0* 39.6 36.6*  MCV 102.1*  --  103.4* 102.2*  PLT 153  --  142* 135*   BMP &GFR Recent Labs  Lab 10/01/20 1517 10/01/20 1553 10/02/20 0301  NA 139 137 139  K 4.3 4.2 4.0  CL 95* 95* 99  CO2 34*  --  32  GLUCOSE 79 79 91  BUN 22 21 17    CREATININE 0.72 0.80 0.70  CALCIUM 9.1  --  8.9   Estimated Creatinine Clearance: 87.6 mL/min (by C-G formula based on SCr of 0.7 mg/dL). Liver & Pancreas: Recent Labs  Lab 10/01/20 1517 10/02/20 0301  AST 34 37  ALT 18 18  ALKPHOS 138* 137*  BILITOT 1.1 1.2  PROT 7.3 7.2  ALBUMIN 4.1 3.9   No results for input(s): LIPASE, AMYLASE in the last 168 hours. No results for input(s): AMMONIA in the last 168 hours. Diabetic: Recent Labs    10/02/20 0301  HGBA1C 5.0   No results for input(s): GLUCAP in the last 168 hours. Cardiac Enzymes: No results for input(s): CKTOTAL, CKMB, CKMBINDEX, TROPONINI in the last 168 hours.  No results for input(s): PROBNP in the last 8760 hours. Coagulation Profile: Recent Labs  Lab 10/01/20 1517  INR 1.0   Thyroid Function Tests: No results for input(s): TSH, T4TOTAL, FREET4, T3FREE, THYROIDAB in the last 72 hours. Lipid Profile: Recent Labs    10/02/20 0301  CHOL 108  HDL 48  LDLCALC 55  TRIG 27  CHOLHDL 2.3   Anemia Panel: Recent Labs    10/02/20 0844  VITAMINB12 2,552*  FOLATE 19.8  FERRITIN 42  TIBC 325  IRON 68   Urine analysis:    Component Value Date/Time   COLORURINE YELLOW 10/01/2020 1631   APPEARANCEUR CLEAR 10/01/2020 1631   LABSPEC 1.014 10/01/2020 1631   PHURINE 7.0 10/01/2020 1631   GLUCOSEU NEGATIVE 10/01/2020 1631   HGBUR NEGATIVE 10/01/2020 1631   BILIRUBINUR NEGATIVE 10/01/2020 1631   KETONESUR NEGATIVE 10/01/2020 1631   PROTEINUR NEGATIVE 10/01/2020 1631   NITRITE NEGATIVE 10/01/2020 1631   LEUKOCYTESUR NEGATIVE 10/01/2020 1631   Sepsis Labs: Invalid input(s): PROCALCITONIN, LACTICIDVEN  Microbiology: Recent Results (from the past 240 hour(s))  Resp Panel by RT-PCR (Flu A&B, Covid) Nasopharyngeal Swab     Status: None   Collection Time: 10/01/20  3:36 PM   Specimen: Nasopharyngeal Swab; Nasopharyngeal(NP) swabs in vial transport medium  Result Value Ref Range Status   SARS Coronavirus 2 by RT  PCR NEGATIVE NEGATIVE Final    Comment: (NOTE) SARS-CoV-2 target nucleic acids are NOT DETECTED.  The SARS-CoV-2 RNA is generally detectable in upper respiratory specimens during the acute phase of infection. The lowest concentration of SARS-CoV-2 viral copies this assay can detect is 138 copies/mL. A negative result does not preclude SARS-Cov-2 infection and should not be used as the sole basis for treatment or other patient management decisions. A negative result may occur with  improper specimen collection/handling, submission of specimen other than nasopharyngeal swab, presence of viral mutation(s) within the areas targeted by this assay, and inadequate number of viral copies(<138 copies/mL). A negative result must be combined with clinical observations, patient history, and epidemiological information. The expected result is Negative.  Fact Sheet for Patients:  BloggerCourse.comhttps://www.fda.gov/media/152166/download  Fact Sheet for Healthcare Providers:  SeriousBroker.ithttps://www.fda.gov/media/152162/download  This test is no t yet approved or cleared by the [place] FDA and  has been authorized for detection and/or diagnosis of SARS-CoV-2 by FDA under an Emergency Use Authorization (EUA). This EUA will remain  in effect (meaning this test can be used) for the duration of the COVID-19 declaration under Section 564(b)(1) of the Act, 21 U.S.C.section 360bbb-3(b)(1), unless the authorization is terminated  or revoked sooner.       Influenza A by PCR NEGATIVE NEGATIVE Final   Influenza B by PCR NEGATIVE NEGATIVE Final    Comment: (NOTE) The Xpert Xpress SARS-CoV-2/FLU/RSV plus assay is intended as an aid in the diagnosis of influenza from Nasopharyngeal swab specimens and should not be used as a sole basis for treatment. Nasal washings and aspirates are unacceptable for Xpert Xpress SARS-CoV-2/FLU/RSV testing.  Fact Sheet for Patients: BloggerCourse.comhttps://www.fda.gov/media/152166/download  Fact Sheet for  Healthcare Providers: SeriousBroker.ithttps://www.fda.gov/media/152162/download  This test is not yet approved or cleared by the [place] FDA and has been authorized for detection and/or diagnosis of SARS-CoV-2 by FDA under an Emergency Use Authorization (EUA). This EUA will remain in effect (meaning this test can be used) for the duration of the COVID-19 declaration under Section 564(b)(1) of the Act, 21 U.S.C. section 360bbb-3(b)(1), unless the authorization is terminated or revoked.  Performed at [facility], [address], [place], [place] [address]     Radiology Studies: No results found.    Mahati Vajda T. Normajean Nash [facility]  If 7PM-7AM, please contact night-coverage [email or web address] 10/03/2020, 11:16 AM

## 2020-10-03 NOTE — Consult Note (Signed)
Neurology Consultation  Reason for Consult: CT head imaging suggesting asymmetric density of the occipital lobes, with left diminished compared to the right. Concern for subacute ischemia versus artifact 2/2 asymmetric positioning during examination. Referring Physician: Dr. Freida BusmanAllen  CC: Dizziness x 2 years, weakness  History is obtained from: Patient, Chart Review  HPI: Jack Neal is a 75 y.o. male with a medical history significant for paroxysmal atrial fibrillation not on anticoagulation or rate control with permanent pacemaker, chronic pain syndrome, depression, major depressive disorder, peripheral polyneuropathy, opioid dependence, and PTSD who [facility] of generalized weakness, fatigue, and ongoing dizziness. He states that he walks approximately 2 miles daily but on 10/01/2020 during his walk he felt dizzy with bilateral lower extremity weakness as if his legs would "give out" on him and significant lethargy requiring him to sit down. He had persistent lethargy attempting to get back home and decided to go to the ED for further evaluation. Of note, he does state that he may have "over done" it the day prior and feels like he may have been fatigued as a result. He also endorses full body shaking on 7/19 that did not improve after taking his Suboxone but has since resolved during his hospitalization. A CT head was obtained in the ED with concern for asymmetric density of the occipital lobe with the left compared to the right concerning for acute stroke versus artifact due to asymmetric positioning. [facility]   Jack Neal states that at baseline, he lives at home with his wife and is able to complete his activities of daily living independently. He is able to manage his own medications, bathe, and dress himself. He requires his wife's assistance with transportation to doctors appointments. He states  that approximately two years ago he began to experience dizziness and has had extensive [facility] and [facility] with medication adjustments without identification of etiology of dizziness. Since his onset of dizziness, he has ambulated with assistance from his Rollator. He does endorse intermittent full body shakes or tremors when he has been without his Suboxone too long but denies further history of tremors.   Modified Rankin Scale: 3-Moderate disability-requires help but walks WITHOUT assistance  ROS: A complete ROS was performed and is negative except as noted in the HPI.  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   Hypotension    Major depressive disorder 10/01/2020   Neuropathy    OSA on CPAP 10/01/2020   Pacemaker    Peripheral polyneuropathy 10/01/2020   Presence of cardiac pacemaker 10/01/2020   PTSD (post-traumatic stress disorder) 10/01/2020  History reviewed. No pertinent surgical history.  Family History  Problem Relation Age of Onset   Heart disease Neg Hx    Social History:   reports that he has never smoked. He has never used smokeless tobacco. He reports that he does not drink alcohol and does not use drugs.  Medications  Current Facility-Administered Medications:    acetaminophen (TYLENOL) tablet 650 mg, 650 mg, Oral, Q4H PRN, 650 mg at 10/03/20 0923 **OR** acetaminophen (TYLENOL) 160 MG/5ML solution 650 mg, 650 mg, Per Tube, Q4H PRN **OR** acetaminophen (TYLENOL) suppository  650 mg, 650 mg, Rectal, Q4H PRN, Shalhoub, Deno Lunger, MD   aspirin EC tablet 81 mg, 81 mg, Oral, Daily, Shalhoub, Deno Lunger, MD, 81 mg at 10/03/20 4650   buprenorphine-naloxone (SUBOXONE) 2-0.5 mg per SL tablet 0.5 tablet,  0.5 tablet, Sublingual, Daily, Shalhoub, Deno Lunger, MD, 0.5 tablet at 10/03/20 3546   DULoxetine (CYMBALTA) DR capsule 30 mg, 30 mg, Oral, QHS, Shalhoub, Deno Lunger, MD, 30 mg at 10/02/20 2120   DULoxetine (CYMBALTA) DR capsule 60 mg, 60 mg, Oral, Daily, Shalhoub, Deno Lunger, MD, 60 mg at 10/03/20 0918   enoxaparin (LOVENOX) injection 40 mg, 40 mg, Subcutaneous, Q24H, Shalhoub, Deno Lunger, MD, 40 mg at 10/03/20 0000   gabapentin (NEURONTIN) capsule 300 mg, 300 mg, Oral, TID, Shalhoub, Deno Lunger, MD, 300 mg at 10/03/20 5681   hydrOXYzine (ATARAX/VISTARIL) tablet 20 mg, 20 mg, Oral, BID, Shalhoub, Deno Lunger, MD, 20 mg at 10/02/20 2120   lipase/protease/amylase (CREON) capsule 36,000 Units, 36,000 Units, Oral, TID AC, Shalhoub, Deno Lunger, MD, 36,000 Units at 10/03/20 0850   ondansetron (ZOFRAN) injection 4 mg, 4 mg, Intravenous, Q6H PRN, Shalhoub, Deno Lunger, MD   polyethylene glycol (MIRALAX / GLYCOLAX) packet 17 g, 17 g, Oral, Daily PRN, Shalhoub, Deno Lunger, MD   polyvinyl alcohol (LIQUIFILM TEARS) 1.4 % ophthalmic solution 1 drop, 1 drop, Right Eye, PRN, Shalhoub, Deno Lunger, MD  Current Outpatient Medications:    buprenorphine-naloxone (SUBOXONE) 2-0.5 mg SUBL SL tablet, Place 1 tablet under the tongue daily., Disp: , Rfl:    doxepin (SINEQUAN) 10 MG capsule, Take 1 capsule by mouth. 1 hour before bedtime, Disp: , Rfl:    DULoxetine (CYMBALTA) 30 MG capsule, Take 1 capsule by mouth in the morning, at noon, and at bedtime., Disp: , Rfl:    gabapentin (NEURONTIN) 300 MG capsule, Take 1 capsule by mouth 3 (three) times daily., Disp: , Rfl:    hydrOXYzine (ATARAX/VISTARIL) 10 MG tablet, Take 2 tablets by mouth at bedtime., Disp: , Rfl:    lidocaine (LIDODERM) 5 %, Place 1 patch onto the skin. Apply 1 patch for 12 hours and then remove for 12 hours, Disp: , Rfl:    loperamide (IMODIUM) 1 MG/5ML solution, Take 5 mLs by mouth daily as needed for diarrhea or loose stools., Disp: , Rfl:    midodrine (PROAMATINE) 2.5 MG  tablet, Take 1 tablet by mouth 3 (three) times daily., Disp: , Rfl:   Exam: Current vital signs: BP 126/83 (BP Location: Left Arm)   Pulse 80   Temp 97.6 F (36.4 C) (Oral)   Resp 16   Ht 5\' 11"  (1.803 m)   Wt 77.1 kg   SpO2 100%   BMI 23.71 kg/m  Vital signs in last 24 hours: Pulse Rate:  [70-87] 80 (07/21 0900) Resp:  [15-18] 16 (07/21 0900) BP: (105-126)/(71-97) 126/83 (07/21 0900) SpO2:  [93 %-100 %] 100 % (07/21 0900)  GENERAL: Awake, alert, in no acute distress Psych: Affect appropriate for situation, calm and cooperative with examination Head: Normocephalic and atraumatic without obvious abnormality EENT: Normal conjunctivae, no OP obstruction, dry mucous membranes LUNGS: Normal respiratory effort. Non-labored breathing on room air CV: Paced rhythm on cardiac monitor, regular rate ABDOMEN: Soft, non-tender, non-distended Ext: warm, well perfused, without obvious deformity   NEURO:  Mental Status: Awake, alert, and oriented to person, place, time, and situation.  He is able to provide a clear and coherent history of present illness. Speech/Language: speech is intact without dysarthria.   Naming, repetition, fluency,  and comprehension intact without aphasia.  No neglect is noted.  Cranial Nerves:  II: PERRL 4 mm/brisk. Visual fields full with complaints of diplopia in inferior visual fields with downward gaze bilaterally. III, IV, VI: EOMI without ptosis, nystagmus, or gaze preference V: Sensation is intact to light touch and symmetrical to face.  VII: Face is symmetric resting and smiling.  VIII: Hearing is intact to voice IX, X: Palate elevation is symmetric. Phonation normal.  XI: Normal sternocleidomastoid and trapezius muscle strength XII: Tongue protrudes midline without fasciculations.   Motor: 5/5 strength is all muscle groups without vertical drift on assessment.  Tone is normal. Bulk is normal.  Sensation: Intact to light touch bilaterally in all four  extremities.  Coordination: FTN intact bilaterally. HKS intact bilaterally. No pronator drift.   DTRs: 2+ and symmetric patellae and biceps Gait: Deferred  NIHSS: 1a Level of Conscious.: 0 1b LOC Questions: 0 1c LOC Commands: 0 2 Best Gaze: 0 3 Visual: 0 4 Facial Palsy: 0 5a Motor Arm - left: 0 5b Motor Arm - Right: 0 6a Motor Leg - Left: 0 6b Motor Leg - Right: 0 7 Limb Ataxia: 0 8 Sensory: 0 9 Best Language: 0 10 Dysarthria: 0 11 Extinct. and Inatten.: 0 TOTAL: 0  Labs I have reviewed labs in epic and the results pertinent to this consultation are: CBC    Component Value Date/Time   WBC 3.1 (L) 10/03/2020 0541   RBC 3.58 (L) 10/03/2020 0541   HGB 11.6 (L) 10/03/2020 0541   HCT 36.6 (L) 10/03/2020 0541   PLT 135 (L) 10/03/2020 0541   MCV 102.2 (H) 10/03/2020 0541   MCH 32.4 10/03/2020 0541   MCHC 31.7 10/03/2020 0541   RDW 13.7 10/03/2020 0541   LYMPHSABS 1.9 10/02/2020 0301   MONOABS 0.5 10/02/2020 0301   EOSABS 0.2 10/02/2020 0301   BASOSABS 0.0 10/02/2020 0301   CMP     Component Value Date/Time   NA 139 10/02/2020 0301   K 4.0 10/02/2020 0301   CL 99 10/02/2020 0301   CO2 32 10/02/2020 0301   GLUCOSE 91 10/02/2020 0301   BUN 17 10/02/2020 0301   CREATININE 0.70 10/02/2020 0301   CALCIUM 8.9 10/02/2020 0301   PROT 7.2 10/02/2020 0301   ALBUMIN 3.9 10/02/2020 0301   AST 37 10/02/2020 0301   ALT 18 10/02/2020 0301   ALKPHOS 137 (H) 10/02/2020 0301   BILITOT 1.2 10/02/2020 0301   GFRNONAA >60 10/02/2020 0301   Lipid Panel     Component Value Date/Time   CHOL 108 10/02/2020 0301   TRIG 27 10/02/2020 0301   HDL 48 10/02/2020 0301   CHOLHDL 2.3 10/02/2020 0301   VLDL 5 10/02/2020 0301   LDLCALC 55 10/02/2020 0301   Lab Results  Component Value Date   HGBA1C 5.0 10/02/2020   Lab Results  Component Value Date   VITAMINB12 2,552 (H) 10/02/2020   No results found for: TSH  Urinalysis    Component Value Date/Time   COLORURINE YELLOW  10/01/2020 1631   APPEARANCEUR CLEAR 10/01/2020 1631   LABSPEC 1.014 10/01/2020 1631   PHURINE 7.0 10/01/2020 1631   GLUCOSEU NEGATIVE 10/01/2020 1631   HGBUR NEGATIVE 10/01/2020 1631   BILIRUBINUR NEGATIVE 10/01/2020 1631   KETONESUR NEGATIVE 10/01/2020 1631   PROTEINUR NEGATIVE 10/01/2020 1631   NITRITE NEGATIVE 10/01/2020 1631   LEUKOCYTESUR NEGATIVE 10/01/2020 1631   Imaging I have reviewed the images obtained: CT-scan of the brain 10/01/2020: 1. Suggestion of asymmetric density  of the occipital lobes, with left diminished compared to right. It is unclear if this is a true finding, which may represent subacute ischemia, or related to asymmetric positioning. Consider further evaluation with MRI. 2. No hemorrhage.  Assessment: 75 y.o. male who presented to [facility] for evaluation of lethargy and bilateral leg weakness with ambulation with associated generalized fatigue. CTH with possible asymmetric density of the occipital lobes L > R versus artifact 2/2 patient asymmetry during CT imaging. Pending transfer to [facility] for MRI brain imaging due to PPM. Of note patient reports prior abnormality described as "void" on head CT 2019 after he was evaluated for "bumps on his head." OSH records not available for my review. - Examination reveals patient without focal neurologic deficit, initial NIHSS of 0. Patient with complaints of diplopia with distant vision for 4-5 months that has been evaluated by outpatient ophthalmology without identification of etiology and a more recent diplopia in inferior visual field with forced downward age.  - CTH imaging possibly suggestive of asymmetric density of the occipital lobes L > R versus artifact 2/2 asymmetric positioning in the CT scanner.  - Due to patient presentation without cortical blindness or loss of visual fields on examination, I have very low suspicion for acute ischemia or infarction. Without assessment correlation to potential CT findings, it is  felt that the density seen on CT imaging is likely artifactual but due to patient with multiple stroke risk factors and high risk for cardioembolic stroke due to AF without AC, believe that MRI brain imaging is necessary for stroke evaluation.  - Stroke risk factors include patient's age, paroxysmal AF not on AC, and OSA. - Patient with ongoing dizziness and weakness with extensive outpatient workup without identification of etiology. Vitamin B12 elevated at 2,552. It is likely that polypharmacy is contributing to patient's chronic dizziness and weakness with medications including midodrine, gabapentin, cymbalta, and suboxone.   Impression: Concern for subacute ischemia versus artifact seen on CTH imaging  Dizziness- chronic  Polypharmacy   Recommendations:  - MRI brain imaging when able to determine subacute ischemia versus artifactual density identified on [facility] - Further recommendations pending MRI results - Further work up of dizziness can be completed on an outpatient basis due to chronic nature  - Assess decision for no AC with paroxysmal AF not on rate control  Pt seen by NP/Neuro and later by MD. Note/plan to be edited by MD as needed.  Lanae Boast, AGAC-NP [facility] Pager: [phone]  Neurology Attending Attestation   I examined the patient and discussed plan with Ms. Toberman NP. Above note has been edited by me to reflect my findings and recommendations.     Bing Neighbors, MD [facility] [phone]   If 7pm- 7am, please page neurology on call as listed in AMION.

## 2020-10-03 NOTE — Plan of Care (Signed)

## 2020-10-03 NOTE — ED Notes (Signed)
Breakfast tray given. °

## 2020-10-04 ENCOUNTER — Observation Stay (HOSPITAL_COMMUNITY): Payer: No Typology Code available for payment source

## 2020-10-04 DIAGNOSIS — G4733 Obstructive sleep apnea (adult) (pediatric): Secondary | ICD-10-CM | POA: Diagnosis not present

## 2020-10-04 DIAGNOSIS — R251 Tremor, unspecified: Secondary | ICD-10-CM | POA: Diagnosis not present

## 2020-10-04 DIAGNOSIS — I480 Paroxysmal atrial fibrillation: Secondary | ICD-10-CM | POA: Diagnosis not present

## 2020-10-04 DIAGNOSIS — R531 Weakness: Secondary | ICD-10-CM | POA: Diagnosis not present

## 2020-10-04 DIAGNOSIS — G629 Polyneuropathy, unspecified: Secondary | ICD-10-CM | POA: Diagnosis not present

## 2020-10-04 LAB — CBC
HCT: 35.6 % — ABNORMAL LOW (ref 39.0–52.0)
Hemoglobin: 11.3 g/dL — ABNORMAL LOW (ref 13.0–17.0)
MCH: 31.7 pg (ref 26.0–34.0)
MCHC: 31.7 g/dL (ref 30.0–36.0)
MCV: 100 fL (ref 80.0–100.0)
Platelets: 140 10*3/uL — ABNORMAL LOW (ref 150–400)
RBC: 3.56 MIL/uL — ABNORMAL LOW (ref 4.22–5.81)
RDW: 13.4 % (ref 11.5–15.5)
WBC: 3.7 10*3/uL — ABNORMAL LOW (ref 4.0–10.5)
nRBC: 0 % (ref 0.0–0.2)

## 2020-10-04 LAB — TSH: TSH: 1.543 u[IU]/mL (ref 0.350–4.500)

## 2020-10-04 MED ORDER — MIDODRINE HCL 5 MG PO TABS
2.5000 mg | ORAL_TABLET | Freq: Three times a day (TID) | ORAL | Status: DC
  Administered 2020-10-05 (×2): 2.5 mg via ORAL
  Filled 2020-10-04 (×2): qty 1

## 2020-10-04 MED ORDER — GADOBUTROL 1 MMOL/ML IV SOLN
6.0000 mL | Freq: Once | INTRAVENOUS | Status: AC | PRN
  Administered 2020-10-04: 6 mL via INTRAVENOUS

## 2020-10-04 MED ORDER — GLYCERIN (LAXATIVE) 2.1 G RE SUPP
1.0000 | Freq: Every day | RECTAL | Status: DC | PRN
  Administered 2020-10-05: 1 via RECTAL
  Filled 2020-10-04 (×3): qty 1

## 2020-10-04 NOTE — Progress Notes (Signed)
PROGRESS NOTE    Jack Neal  YSA:[id number] DOB: 01-15-1946 DOA: 10/01/2020 PCP: [facility]   Brief Narrative: Jack Neal is a 75 y.o. male with a history of atrial fibrillation, OSA, solitary kidney, gastric bypass, celiac disease, PTSD, depression, anxiety, chronic pain, and vertigo. Patient presented secondary to weakness, fatigue and tremor with initial CT head concerning for possibly infarct. EEG was negative for seizures. MRI finally obtained and was negative for acute infarct. PT/OT recommending SNF discharge.   Assessment & Plan:   Principal Problem:   Generalized weakness Active Problems:   Unsteady gait when walking   AF (paroxysmal atrial fibrillation) ([name])   Presence of cardiac pacemaker   Peripheral polyneuropathy   Major depressive disorder   OSA on CPAP   Chronic pain syndrome   Tremors Weakness Non-specific. Neurology consulted. CT head was concerning for possible stroke. MRI without acute process to explain symptoms and was negative for previously described area of uncertainty seen on CT head. Tremors are making it difficult for patient to ambulate safely. EEG without evidence of seizures. Neurology is on board. -Neurology recommendations pending -PT/OT recommending SNF -Check CK  Moderate/sever tricuspid valve regurgitation Noted on Transthoracic Echocardiogram this admission. Patient will need outpatient cardiology follow-up.  Paroxysmal atrial fibrillation Not on anticoagulation or rate/rhythm control medication. S/p PPM  OSA on CPAP -Continue CPAP qhs  History of gastric bypass/celiac disease -Continue gluten free diet  PTSD Depression Anxiety Patient is on a high dose of Cymbalta -Continue Cymbalta  Opioid abuse Chronic pain Listed in care everywhere. Currently on Suboxone as an outpatient. -Continue Suboxone   DVT prophylaxis: Lovenox Code Status:   Code Status: DNR Family Communication: Wife and daughter at  bedside Disposition Plan: Discharge to SNF once bed is available.   Consultants:  Neurology  Procedures:  TRANSTHORACIC ECHOCARDIOGRAM (10/02/2020) IMPRESSIONS     1. Left ventricular ejection fraction, by estimation, is 55%. The left  ventricle has normal function. The left ventricle has no regional wall  motion abnormalities. There is mild left ventricular hypertrophy. Left  ventricular diastolic parameters are  indeterminate.   2. Right ventricular systolic function is normal. The right ventricular  size is mildly enlarged. There is mildly elevated pulmonary artery  systolic pressure. The estimated right ventricular systolic pressure is  38.8 mmHg.   3. Left atrial size was moderately dilated.   4. Right atrial size was severely dilated.   5. The mitral valve is normal in structure. Mild mitral valve  regurgitation. No evidence of mitral stenosis. Moderate mitral annular  calcification.   6. The tricuspid valve is abnormal. Tricuspid valve regurgitation is  moderate to severe.   7. The aortic valve is tricuspid. Aortic valve regurgitation is not  visualized. No aortic stenosis is present.   8. Aortic dilatation noted. There is mild dilatation of the aortic root,  measuring 39 mm.   9. The inferior vena cava is dilated in size with <50% respiratory  variability, suggesting right atrial pressure of 15 mmHg.  10. The patient appears to be in atrial fibrillation.  Antimicrobials: None    Subjective: Patient reports multiple episodes of whole body shaking. Shaking starts in shoulders and migrates downwards. Most recent episode occurred when working with OT requiring abrupt cessation of therapy. Patient also describes a feeling of numbness in his legs after prolonged walking that improves with rest. Frequency and duration of shaking/tremor episodes have decreased.  Objective: Vitals:   10/04/20 0422 10/04/20 0759 10/04/20 1126 10/04/20 1636  BP: 97/67 93/68  109/72  Pulse:  71 80 80 81  Resp: 18 14  14   Temp: 97.8 F (36.6 C) 98 F (36.7 C) (!) 97.5 F (36.4 C) 97.8 F (36.6 C)  TempSrc: Oral Oral Oral Oral  SpO2: 95% 95% 98% 97%  Weight:      Height:       No intake or output data in the 24 hours ending 10/04/20 1723 Filed Weights   10/01/20 1505  Weight: 77.1 kg    Examination:  General exam: Appears calm and comfortable Respiratory system: Clear to auscultation. Respiratory effort normal. Cardiovascular system: S1 & S2 heard, normal rate Gastrointestinal system: Abdomen is nondistended, soft and nontender. No organomegaly or masses felt. Normal bowel sounds heard. Central nervous system: Alert and oriented. No focal neurological deficits. No clonus elicited. Musculoskeletal: No calf tenderness Skin: No cyanosis. No rashes. LE venous stasis changes noted Psychiatry: Judgement and insight appear normal. Mood & affect appropriate.     Data Reviewed: I have personally reviewed following labs and imaging studies  CBC Lab Results  Component Value Date   WBC 3.7 (L) 10/04/2020   RBC 3.56 (L) 10/04/2020   HGB 11.3 (L) 10/04/2020   HCT 35.6 (L) 10/04/2020   MCV 100.0 10/04/2020   MCH 31.7 10/04/2020   PLT 140 (L) 10/04/2020   MCHC 31.7 10/04/2020   RDW 13.4 10/04/2020   LYMPHSABS 1.9 10/02/2020   MONOABS 0.5 10/02/2020   EOSABS 0.2 10/02/2020   BASOSABS 0.0 10/02/2020     Last metabolic panel Lab Results  Component Value Date   NA 139 10/02/2020   K 4.0 10/02/2020   CL 99 10/02/2020   CO2 32 10/02/2020   BUN 17 10/02/2020   CREATININE 0.70 10/02/2020   GLUCOSE 91 10/02/2020   GFRNONAA >60 10/02/2020   CALCIUM 8.9 10/02/2020   PROT 7.2 10/02/2020   ALBUMIN 3.9 10/02/2020   BILITOT 1.2 10/02/2020   ALKPHOS 137 (H) 10/02/2020   AST 37 10/02/2020   ALT 18 10/02/2020   ANIONGAP 8 10/02/2020    CBG (last 3)  No results for input(s): GLUCAP in the last 72 hours.   GFR: Estimated Creatinine Clearance: 87.6 mL/min (by C-G  formula based on SCr of 0.7 mg/dL).  Coagulation Profile: Recent Labs  Lab 10/01/20 1517  INR 1.0    Recent Results (from the past 240 hour(s))  Resp Panel by RT-PCR (Flu A&B, Covid) Nasopharyngeal Swab     Status: None   Collection Time: 10/01/20  3:36 PM   Specimen: Nasopharyngeal Swab; Nasopharyngeal(NP) swabs in vial transport medium  Result Value Ref Range Status   SARS Coronavirus 2 by RT PCR NEGATIVE NEGATIVE Final    Comment: (NOTE) SARS-CoV-2 target nucleic acids are NOT DETECTED.  The SARS-CoV-2 RNA is generally detectable in upper respiratory specimens during the acute phase of infection. The lowest concentration of SARS-CoV-2 viral copies this assay can detect is 138 copies/mL. A negative result does not preclude SARS-Cov-2 infection and should not be used as the sole basis for treatment or other patient management decisions. A negative result may occur with  improper specimen collection/handling, submission of specimen other than nasopharyngeal swab, presence of viral mutation(s) within the areas targeted by this assay, and inadequate number of viral copies(<138 copies/mL). A negative result must be combined with clinical observations, patient history, and epidemiological information. The expected result is Negative.  Fact Sheet for Patients:  10/03/20  Fact Sheet for Healthcare Providers:  BloggerCourse.com  This  test is no t yet approved or cleared by the [place] and  has been authorized for detection and/or diagnosis of SARS-CoV-2 by FDA under an Emergency Use Authorization (EUA). This EUA will remain  in effect (meaning this test can be used) for the duration of the COVID-19 declaration under Section 564(b)(1) of the Act, 21 U.S.C.section 360bbb-3(b)(1), unless the authorization is terminated  or revoked sooner.       Influenza A by PCR NEGATIVE NEGATIVE Final   Influenza B by PCR  NEGATIVE NEGATIVE Final    Comment: (NOTE) The Xpert Xpress SARS-CoV-2/FLU/RSV plus assay is intended as an aid in the diagnosis of influenza from Nasopharyngeal swab specimens and should not be used as a sole basis for treatment. Nasal washings and aspirates are unacceptable for Xpert Xpress SARS-CoV-2/FLU/RSV testing.  Fact Sheet for Patients: BloggerCourse.com  Fact Sheet for Healthcare Providers: SeriousBroker.it  This test is not yet approved or cleared by the [place] FDA and has been authorized for detection and/or diagnosis of SARS-CoV-2 by FDA under an Emergency Use Authorization (EUA). This EUA will remain in effect (meaning this test can be used) for the duration of the COVID-19 declaration under Section 564(b)(1) of the Act, 21 U.S.C. section 360bbb-3(b)(1), unless the authorization is terminated or revoked.  Performed at [facility], [address], [place], [place] [address]         Radiology Studies: MR BRAIN W WO CONTRAST  Result Date: 10/04/2020 CLINICAL DATA:  Neuro deficit, acute, stroke suspected. Dizziness and weakness. EXAM: MRI HEAD WITHOUT AND WITH CONTRAST TECHNIQUE: Multiplanar, multiecho pulse sequences of the brain and surrounding structures were obtained without and with intravenous contrast. CONTRAST:  60mL GADAVIST GADOBUTROL 1 MMOL/ML IV SOLN COMPARISON:  Head CT 10/01/2020 FINDINGS: Brain: There is no evidence of an acute infarct, intracranial hemorrhage, mass, midline shift, or extra-axial fluid collection. Small T2 hyperintensities in the cerebral white matter bilaterally are nonspecific but compatible with mild chronic small vessel ischemic disease. There is mild generalized cerebral atrophy. No abnormality is seen to correspond to the asymmetric hypoattenuation of the left occipital lobe on the recent CT which was likely artifactual. No abnormal enhancement is identified.  Vascular: Major intracranial vascular flow voids are preserved. Skull and upper cervical spine: No suspicious marrow lesion. Disc and asymmetrically advanced left facet degeneration at C3-4 and C4-5. Sinuses/Orbits: Bilateral cataract extraction. Paranasal sinuses and mastoid air cells are clear. Other: None. IMPRESSION: 1. No acute intracranial abnormality. 2. Mild chronic small vessel ischemic disease. Electronically Signed   By: Sebastian Ache M.D.   On: 10/04/2020 14:27        Scheduled Meds:  aspirin EC  81 mg Oral Daily   buprenorphine-naloxone  0.5 tablet Sublingual Daily   DULoxetine  30 mg Oral QHS   DULoxetine  60 mg Oral Daily   enoxaparin (LOVENOX) injection  40 mg Subcutaneous Q24H   gabapentin  300 mg Oral TID   hydrOXYzine  20 mg Oral BID   lipase/protease/amylase  36,000 Units Oral TID AC   [START ON 10/05/2020] midodrine  2.5 mg Oral TID WC   Continuous Infusions:   LOS: 0 days     Jacquelin Hawking, MD [facility] 10/04/2020, 5:23 PM  If 7PM-7AM, please contact night-coverage [email or web address]

## 2020-10-04 NOTE — TOC Initial Note (Signed)
Transition of Care [facility]) - Initial/Assessment Note    Patient Details  Name: Jack Neal MRN: [id number] Date of Birth: 24-Jan-1946  Transition of [facility]) CM/SW Contact:    Kermit Balo, RN Phone Number: 10/04/2020, 11:55 AM  Clinical Narrative:                 Patient lives at home with spouse that can provide 24 hour supervision. Pt has all needed DME at home. Pt denies issues with home medications or transportation. Recommendations are for outpatient therapy. Pt prefers HH services. Pt without a preference for HH. HH arranged through [facility].  [facility] is active with [facility] for [facility]. Voicemail left with their coordinator about admission. TOC following.  Expected Discharge Plan: Home w Home Health Services Barriers to Discharge: Continued Medical Work up   Patient Goals and CMS Choice   CMS Medicare.gov Compare Post Acute Care list provided to:: Patient Choice offered to / list presented to : Patient  Expected Discharge Plan and Services Expected Discharge Plan: Home w Home Health Services   Discharge Planning Services: CM Consult Post Acute Care Choice: Home Health Living arrangements for the past 2 months: Apartment                           HH Arranged: PT HH Agency: [facility] Date [facility] Agency Contacted: 10/04/20   Representative spoke with at [facility] Agency: Kandee Keen  Prior Living Arrangements/Services Living arrangements for the past 2 months: Apartment Lives with:: Spouse Patient language and need for interpreter reviewed:: Yes Do you feel safe going back to the place where you live?: Yes      Need for Family Participation in Patient Care: Yes (Comment) Care giver support system in place?: Yes (comment) Current home services: DME (rollator/ cane shower bench/ CPAP) Criminal Activity/Legal Involvement Pertinent to Current Situation/Hospitalization: No - Comment as needed  Activities of Daily Living Home Assistive Devices/Equipment:  Other (Comment), Eyeglasses (rolator, transistion bench for tub, , bilateral hearing aides) ADL Screening (condition at time of admission) Patient's cognitive ability adequate to safely complete daily activities?: Yes Is the patient deaf or have difficulty hearing?: No Does the patient have difficulty seeing, even when wearing glasses/contacts?: Yes (dobule vision) Does the patient have difficulty concentrating, remembering, or making decisions?: No Patient able to express need for assistance with ADLs?: Yes Does the patient have difficulty dressing or bathing?: No Independently performs ADLs?: No Communication: Independent Dressing (OT): Independent Grooming: Independent Feeding: Independent Bathing: Independent Toileting: Needs assistance Is this a change from baseline?: Change from baseline, expected to last <3 days In/Out Bed: Needs assistance Is this a change from baseline?: Change from baseline, expected to last <3 days Walks in Home: Independent with device (comment) (uses a rolator) Does the patient have difficulty walking or climbing stairs?: Yes Weakness of Legs: Both Weakness of Arms/Hands: Both  Permission Sought/Granted                  Emotional Assessment Appearance:: Appears stated age Attitude/Demeanor/Rapport: Engaged Affect (typically observed): Accepting Orientation: : Oriented to Self, Oriented to Place, Oriented to  Time, Oriented to Situation   Psych Involvement: No (comment)  Admission diagnosis:  Tremor [R25.1] Fall [W19.XXXA] Generalized weakness [R53.1] Patient Active Problem List   Diagnosis Date Noted   Tremor    Abnormal head CT    Generalized weakness 10/01/2020   Unsteady gait when walking 10/01/2020   AF (paroxysmal atrial  fibrillation) ([name]) 10/01/2020   Presence of cardiac pacemaker 10/01/2020   Peripheral polyneuropathy 10/01/2020   Major depressive disorder 10/01/2020   OSA on CPAP 10/01/2020   Chronic pain syndrome 10/01/2020    Celiac disease 10/01/2020   History of gastric bypass 10/01/2020   PCP:  [facility] Pharmacy:   [facility] - [place], [place] - [address] [place] [address] Phone: [phone] Fax: [phone]     Social Determinants of Health (SDOH) Interventions    Readmission Risk Interventions No flowsheet data found.

## 2020-10-04 NOTE — Plan of Care (Signed)

## 2020-10-04 NOTE — Progress Notes (Addendum)
Occupational Therapy Treatment  Pt progressing towards established OT goals and is very motivated to participate in therapy to return to PLOF. Pt continues to present with decreased activity tolerance as seen by fatigue with BADLs and short distance mobility. Pt performing toileting, hand hygiene, and functional mobility with Supervision-Min Guard A. During mobility in hallway, pt with two episodes of dizziness. BP stable: 113/77 (88) and 117/76 (89). Pt reporting that at baseline, he will become dizzy with mobility and if he stand still it was resolve. After mobility, pt transferring to recliner and presenting with shaking episode (whole body tremors). RN notified and present. Continue to recommend dc to home and will continue to follow acutely as admitted to increase safety and activity tolerance for ADLs.     10/04/20 1300  OT Visit Information  Assistance Needed +1  History of Present Illness 75 year old male presenting [facility] ED on 10/01/20 after experiencing episodes of severe generalized weakness and fatigue. CT-Suggestion of asymmetric density of the occipital lobes, with  left diminished compared to right. It is unclear if this is a true  finding, which may represent subacute ischemia, or related to  asymmetric positioning. Awaiting MRI. PMH including gastric bypass, a-fib, s/p pacemaker , obstructive sleep apnea on CPAP, PTSD, major depressive disorder, panic disorder, chronic pain syndrome with opiate dependence.  Precautions  Precautions Fall  Precaution Comments Whole body shakes; pt keeping a log of each episode  Pain Assessment  Faces Pain Scale 2  Pain Location head  Pain Descriptors / Indicators Aching  Cognition  Arousal/Alertness Awake/alert  Behavior During Therapy WFL for tasks assessed/performed  Overall Cognitive Status Within Functional Limits for tasks assessed  General Comments Very agreeable to therapy and aware of functional performance  ADL  Overall ADL's  Needs  assistance/impaired  Eating/Feeding Set up;Sitting  Eating/Feeding Details (indicate cue type and reason) set up for lunch  Grooming Set up;Sitting  Toilet Transfer Min guard;Ambulation;RW  Toilet Transfer Details (indicate cue type and reason) Min Guard A for safety  Toileting- Architect and Hygiene Supervision/safety;Sitting/lateral lean  Toileting - Clothing Manipulation Details (indicate cue type and reason) Supervision for safety  Functional mobility during ADLs Min guard;Rolling walker  General ADL Comments Pt performing toileting and then functional mobility in hallway with Supervision-Min Guard A. Pt with two episodes of dizziness during mobility in hallway; BP stable see general comments. At end of walk, pt with shaking episode; RN present  Bed Mobility  Overal bed mobility Needs Assistance  Bed Mobility Supine to Sit  Supine to sit Supervision  General bed mobility comments Supervision for safety  Balance  Overall balance assessment History of Falls;Needs assistance  Sitting-balance support Feet supported  Sitting balance-Leahy Scale Fair  Standing balance support Bilateral upper extremity supported  Standing balance-Leahy Scale Fair  Standing balance comment Able to maintain static stanidng without UE support  Transfers  Overall transfer level Needs assistance  Equipment used Rolling walker (2 wheeled)  Transfers Sit to/from Stand  Sit to Stand Min guard  General transfer comment min G for safety  General Comments  General comments (skin integrity, edema, etc.) BP in standing (halfway during walk) 113/77 (88) and (after walk) 117/76 (89).  OT - End of Session  Equipment Utilized During Treatment Rolling walker;Gait belt  Activity Tolerance Patient tolerated treatment well  Patient left in bed;with call bell/phone within reach  Nurse Communication Mobility status  OT Assessment/Plan  OT Visit Diagnosis Unsteadiness on feet (R26.81);History of falling (Z91.81)   OT  Frequency (ACUTE ONLY) Min 2X/week  Follow Up Recommendations No OT follow up;Other (comment) (anticipate no follow up needs pending resolution of tremor sypmtoms)  OT Equipment None recommended by OT  AM-PAC OT "6 Clicks" Daily Activity Outcome Measure (Version 2)  Help from another person eating meals? 4  Help from another person taking care of personal grooming? 3  Help from another person toileting, which includes using toliet, bedpan, or urinal? 3  Help from another person bathing (including washing, rinsing, drying)? 3  Help from another person to put on and taking off regular upper body clothing? 3  Help from another person to put on and taking off regular lower body clothing? 3  6 Click Score 19  Acute Rehab OT Goals  Patient Stated Goal to go home  OT Goal Formulation With patient  Time For Goal Achievement 10/16/20  Potential to Achieve Goals Good  ADL Goals  Additional ADL Goal #1 Patient will be modified independent with all ADL tasks in order to return home safely with spouse.    Jack Neal MSOT, OTR/L Acute Rehab Pager: [phone] Office: [phone]

## 2020-10-04 NOTE — Progress Notes (Signed)
Physical Therapy Treatment/Vestibular assessment Patient Details Name: Jack Neal MRN: [id number] DOB: 12/26/1945 Today's Date: 10/04/2020    History of Present Illness 75 year old male presenting to [facility] ED on 10/01/20 after experiencing episodes of severe generalized weakness and fatigue. CT-Suggestion of asymmetric density of the occipital lobes, with  left diminished compared to right.  MRI, however, was negative. PMH including gastric bypass, a-fib, s/p pacemaker , obstructive sleep apnea on CPAP, PTSD, major depressive disorder, panic disorder, chronic pain syndrome with opiate dependence.    PT Comments    Pt did not test positive for anything significant during my vestibular assessment.  He was able to walk in the hallway with a rollator and min guard to min assist (min assist when his legs started to get shaky and needed to sit).  BPs were stable, actually increasing with gait and standing from 108/72 EOB to 127/86 after walking a short distance.  He walked 150', legs started to get numb and shaky, we sat for ~8 mins and then got up and finished his walk.  Dr. Caleb Popp notified of concerns.  There were no tremors during my >70 minute session with him.  I had a long talk re: rehab options and his wife's concerns re: being unable to provide him with any physical assist that he needs right now.  They would like to pursue SNF for rehab, but have questions for the social worker before fully committing.  PT will continue to follow acutely.    Follow Up Recommendations  SNF     Equipment Recommendations  None recommended by PT    Recommendations for Other Services       Precautions / Restrictions Precautions Precautions: Fall Precaution Comments: h/o falls at home one he relates to vertigo    Mobility  Bed Mobility Overal bed mobility: Modified Independent                  Transfers Overall transfer level: Needs assistance   Transfers: Sit to/from Stand Sit to Stand: Min  guard         General transfer comment: Min guard assist for safety  Ambulation/Gait Ambulation/Gait assistance: Min guard;Min assist Gait Distance (Feet): 150 Feet Assistive device: 4-wheeled walker Gait Pattern/deviations: Step-through pattern Gait velocity: decreased Gait velocity interpretation: 1.31 - 2.62 ft/sec, indicative of limited community ambulator General Gait Details: Pt self cueing for upright posture.  He gets to ~150' and turns abruptly around to head back to his room stating that his legs were going numb and getting weak.  I asked him more about this and he states that at the same distance, every time for the past two weeks as he tried to do his 2 mi walk he had to sit, recover 10-15 mins and then could get back up, go again a similar distance and had to stop again.   Stairs             Wheelchair Mobility    Modified Rankin (Stroke Patients Only)       Balance Overall balance assessment: Needs assistance Sitting-balance support: Feet supported;No upper extremity supported Sitting balance-Leahy Scale: Good Sitting balance - Comments: pt reaching down to floor to donn slippers, coming back up to sitting, no issues   Standing balance support: Bilateral upper extremity supported;Single extremity supported;No upper extremity supported Standing balance-Leahy Scale: Fair Standing balance comment: supervision in static standing while pulling his breifs up in the bathroom.            10/04/20 1847  Oculomotor Exam  Spontaneous Absent  Gaze-induced  Absent  Smooth Pursuits Saccades  Oculomotor Exam-Fixation Suppressed   Left Head Impulse neg  Right Head Impulse neg  Vestibulo-Ocular Reflex  VOR 1 Head Only (x 1 viewing) horizontal more symptomatic than vertical no issues with maintaining gaze on target  VOR Cancellation Normal  Auditory  Comments decreased hearing left ear  Positional Testing  Dix-Hallpike Dix-Hallpike Right;Dix-Hallpike Left   Horizontal Canal Testing Horizontal Canal Right;Horizontal Canal Left  Dix-Hallpike Right  Dix-Hallpike Right Duration 0  Dix-Hallpike Right Symptoms No nystagmus  Dix-Hallpike Left  Dix-Hallpike Left Duration 0  Dix-Hallpike Left Symptoms No nystagmus  Horizontal Canal Right  Horizontal Canal Right Duration 0  Horizontal Canal Right Symptoms Normal  Horizontal Canal Left  Horizontal Canal Left Duration 0  Horizontal Canal Left Symptoms Normal  Orthostatics  BP sitting 108/72  HR sitting 80  BP standing (after 3 minutes) 127/86  HR standing (after 3 minutes) 82                    Cognition Arousal/Alertness: Awake/alert Behavior During Therapy: WFL for tasks assessed/performed Overall Cognitive Status: Within Functional Limits for tasks assessed                                        Exercises      General Comments        Pertinent Vitals/Pain Pain Assessment: No/denies pain    Home Living                      Prior Function            PT Goals (current goals can now be found in the care plan section) Acute Rehab PT Goals Patient Stated Goal: to get answers Progress towards PT goals: Progressing toward goals    Frequency    Min 3X/week      PT Plan Frequency needs to be updated;Discharge plan needs to be updated    Co-evaluation              AM-PAC PT "6 Clicks" Mobility   Outcome Measure  Help needed turning from your back to your side while in a flat bed without using bedrails?: None Help needed moving from lying on your back to sitting on the side of a flat bed without using bedrails?: None Help needed moving to and from a bed to a chair (including a wheelchair)?: A Little Help needed standing up from a chair using your arms (e.g., wheelchair or bedside chair)?: A Little Help needed to walk in hospital room?: A Little Help needed climbing 3-5 steps with a railing? : A Little 6 Click Score: 20    End of  Session Equipment Utilized During Treatment: Gait belt Activity Tolerance: Patient limited by fatigue Patient left: in bed;with call bell/phone within reach;with bed alarm set;with family/visitor present Nurse Communication: Mobility status PT Visit Diagnosis: Muscle weakness (generalized) (M62.81);Difficulty in walking, not elsewhere classified (R26.2);Unsteadiness on feet (R26.81)     Time: 7824-2353 PT Time Calculation (min) (ACUTE ONLY): 77 min  Charges:  $Gait Training: 23-37 mins $Therapeutic Activity: 38-52 mins                    Corinna Capra, PT, DPT  Acute Rehabilitation Ortho Tech Supervisor [phone] pager [phone]) [phone] office

## 2020-10-04 NOTE — Progress Notes (Addendum)
Neurology Attending Attestation   I examined the patient and discussed plan with Ms. Toberman NP. Note below has been edited by me to reflect my findings and recommendations with the following additional updates.  MRI brain with and without contrast showed mild CSVID and no other abnl. Hypodensity seen on CT had no correlate and was artifact.  Patient showed me a log of 20+ episodes of shaking over the past 24 hrs. They can last for a few seconds, minutes, or rarely hours. They are not associated with alteration of consciousness. They are not stereotyped and can occur in any part of his body or his whole body "always different".  I explained to him and his wife that further workup of the spells would be best suited to the outpatient setting. He has seen a neurologist at the [place] but there is not a full-time neurologist there and arranging f/u has been difficult. I think he would certainly benefit from referral to a community neurologist and patient and his wife agree.  Recommendations: - Per patient request and need for ongoing comprehensive outpatient workup for multiple issues including dizziness, tremors, abnormal movements, spells, I would recommend that his [facility] place referral to community care for neurology - I will arrange outpatient neurology f/u with [facility]. Patient will request his records from [place] and bring them to this appt. - He will continue to keep a log of his spells. His wife will also try to capture some different types on video and they will bring both to appt with his new neurologist. - From what I can gather from the partial [facility] notes I have access to, he has pAF s/p ablation not on therapeutic anticoagulation for unclear reasons. This should be readdressed in clinic once his [facility] records are received to ensure that he is started on anticoagulation if appropriate and no contraindication  Extensive discussion at bedside with husband and wife about these recommendations.  All questions answered to their satisfaction. Neurology to sign off, but please re-engage if additional questions arise.    Bing Neighbors, MD [facility] [phone]   If 7pm- 7am, please page neurology on call as listed in AMION.   Neurology Progress Note  Brief HPI: 75 y.o. with PMHx of PAF not on AC or rate control s/p PPM, chronic pain, depression, MDD< polyneuropathy, opioid dependence, and PTSD who presented to [facility] 10/01/2020 for evaluation of generalized weakness, fatigue, and chronic dizziness (2 years duration with extensive outpatient work up without identified etiology) with full body shaking that has since resolved. Patient with history of tremors 2/2 delayed suboxone dosing.  A CT head was obtained in the ED with concern for asymmetric density of the occipital lobe with the left compared to the right concerning for acute stroke versus artifact due to asymmetric positioning and was subsequently transferred to [facility] for MRI imaging due to [facility] and neurology evaluation.  Patient endorses that he is not on [facility] for his AF due to his CHA2DS2-VASc score of 1 indicating he is at low risk for stroke.   Subjective: No acute overnight events Patient transferred to [facility] overnight- MRI brain pending Pt endorses "low-grade" headache since hospitalization like a band around his head with focus on the left occipital area that is 4/10 in severity and is improved with acetaminophen  Exam: Vitals:   10/04/20 0422 10/04/20 0759  BP: 97/67 93/68  Pulse: 71 80  Resp: 18 14  Temp: 97.8 F (36.6 C) 98 F (36.7 C)  SpO2: 95% 95%   Gen: Awake, sitting up in bed, eating breakfast, in no acute distress Resp: non-labored breathing, no respiratory distress Abd: soft, non-tender, non-distended  Neuro: Mental Status: Awake, alert, and oriented to person, place, time, and situation. He is able to provide a clear and coherent history of present illness. Speech/Language: speech  is intact without dysarthria.   Naming, repetition, fluency, and comprehension intact without aphasia. No neglect is noted.  Cranial Nerves: II: PERRL 4 mm/brisk. Visual fields full. III, IV, VI: EOMI without ptosis, nystagmus, or gaze preference V: Sensation is intact to light touch and symmetrical to face. VII: Face is symmetric resting and smiling. VIII: Hearing is intact to voice IX, X: Palate elevation is symmetric. Phonation normal. XI: Normal sternocleidomastoid and trapezius muscle strength XII: Tongue protrudes midline without fasciculations.   Motor: 5/5 strength is all muscle groups without vertical drift on assessment.  Tone is normal. Bulk is normal. Sensation: Intact to light touch bilaterally in all four extremities. Coordination: FTN intact bilaterally. HKS intact bilaterally. No pronator drift.   Gait: Deferred  Pertinent Labs: CBC    Component Value Date/Time   WBC 3.7 (L) 10/04/2020 0547   RBC 3.56 (L) 10/04/2020 0547   HGB 11.3 (L) 10/04/2020 0547   HCT 35.6 (L) 10/04/2020 0547   PLT 140 (L) 10/04/2020 0547   MCV 100.0 10/04/2020 0547   MCH 31.7 10/04/2020 0547   MCHC 31.7 10/04/2020 0547   RDW 13.4 10/04/2020 0547   LYMPHSABS 1.9 10/02/2020 0301   MONOABS 0.5 10/02/2020 0301   EOSABS 0.2 10/02/2020 0301   BASOSABS 0.0 10/02/2020 0301   CMP     Component Value Date/Time   NA 139 10/02/2020 0301   K 4.0 10/02/2020 0301   CL 99 10/02/2020 0301   CO2 32 10/02/2020 0301   GLUCOSE 91 10/02/2020 0301   BUN 17 10/02/2020 0301   CREATININE 0.70 10/02/2020 0301   CALCIUM 8.9 10/02/2020 0301   PROT 7.2 10/02/2020 0301   ALBUMIN 3.9 10/02/2020 0301   AST 37 10/02/2020 0301   ALT 18 10/02/2020 0301   ALKPHOS 137 (H) 10/02/2020 0301   BILITOT 1.2 10/02/2020 0301   GFRNONAA >60 10/02/2020 0301   Lab Results  Component Value Date   CHOL 108 10/02/2020   HDL 48 10/02/2020   LDLCALC 55 10/02/2020   TRIG 27 10/02/2020   CHOLHDL 2.3 10/02/2020   Lab  Results  Component Value Date   HGBA1C 5.0 10/02/2020   Lab Results  Component Value Date   VITAMINB12 2,552 (H) 10/02/2020   Imaging Reviewed:  CT-scan of the brain 10/01/2020: 1. Suggestion of asymmetric density of the occipital lobes, with left diminished compared to right. It is unclear if this is a true finding, which may represent subacute ischemia, or related to asymmetric positioning. Consider further evaluation with MRI. 2. No hemorrhage.  MRI brain pending  Assessment:  75 y.o. male who presented to [facility] for evaluation of lethargy and BLE weakness with ambulation with associated generalized fatigue. CTH obtained with possible asymmetric density of the occipital lobes L > R versus artifact 2/2 patient asymmetry during CT imaging. Transferred to [facility] 10/03/2020 for MRI brain imaging due to PPM. Of note patient reports prior abnormality described as "void" on head CT 2019 after he was evaluated for "bumps on his head." OSH records not available for my review. - Examination reveals patient without focal neurologic deficit, initial NIHSS of 0. Patient with complaints of diplopia with distant  vision for 4-5 months that has been evaluated by outpatient ophthalmology without identification of etiology and a more recent diplopia in inferior visual field with forced downward age. - CTH imaging possibly suggestive of asymmetric density of the occipital lobes L > R versus artifact 2/2 asymmetric positioning in the CT scanner. - Due to patient presentation without cortical blindness or loss of visual fields on examination, I have very low suspicion for acute ischemia or infarction. Without assessment correlation to potential CT findings, it is felt that the density seen on CT imaging is likely artifactual but due to patient with multiple stroke risk factors and high risk for cardioembolic stroke due to AF without AC, believe that MRI brain imaging is necessary for stroke evaluation. - Stroke  risk factors include patient's age, paroxysmal AF not on AC, and OSA. - Patient with ongoing dizziness and weakness with extensive outpatient workup without identification of etiology. Vitamin B12 elevated at 2,552. It is likely that polypharmacy is contributing to patient's chronic dizziness and weakness with medications including midodrine, gabapentin, cymbalta, and suboxone.  - Pt c/o "tremors" overnight that are sporadic and intermittently and inconsistently affecting each extremity, head, ribs, and chest sporadically all lasting <1 minute with varying intensity. He has kept record of approximately 30 reported tremor events overnight. Tremors/twitching are also a known side effect of medications listed above with low suspicion for seizure due to varying nature, location, and severity of reported spasms. No tremors or spasms noted during examination.   Impression:  Concern for subacute ischemia versus artifact seen on CTH imaging (MRI brain pending) Dizziness- chronic Polypharmacy   Recommendations: - MRI brain when able  - Further recommendations pending MRI results - Further work up of dizziness can be completed on an outpatient basis due to chronic nature  Lanae Boast, AGACNP-BC [facility] [phone]

## 2020-10-04 NOTE — Progress Notes (Signed)
Paged on call hospitalist as follows:  Hi Dr. Loney Loh, this patient apparently has not had a bowel movement since the 18th. He has had miralax and prune juice. Patient states Glycerin suppositories work well for this issue at home. The patient had a 15 beat run of Vtach at 1911. The patient is a DNR. The patient has a history of a-fib and has aV-paced medtronic pacemaker. Maryella Shivers RN [phone]

## 2020-10-04 NOTE — Progress Notes (Addendum)
Physical Therapy Treatment/Vestibular Assessment Patient Details Name: Jack Neal MRN: [id number] DOB: Sep 09, 1945 Today's Date: 10/04/2020    History of Present Illness 75 year old male presenting to [facility] ED on 10/01/20 after experiencing episodes of severe generalized weakness and fatigue. CT-Suggestion of asymmetric density of the occipital lobes, with  left diminished compared to right. It is unclear if this is a true  finding, which may represent subacute ischemia, or related to  asymmetric positioning. transfered to [facility] and awaiting MRI. PMH including gastric bypass, a-fib, s/p pacemaker , obstructive sleep apnea on CPAP, PTSD, major depressive disorder, panic disorder, chronic pain syndrome with opiate dependence.    PT Comments    Pt describes symptoms of tipping to the left, progressive hearing loss, and fullness in his left ear (which he takes chronic sudafed and mucinex).  He has double vision in his left upper quadrant of vision and when he tracks with his eyes he has nystagmus with no identifiable direction (almost spasmic).  I was unable to complete a full assessment as the tech came to transport him to MRI.  He may have multiple issues going on, possible central origins superimposed on ? Progressive vestibular disorder (Meniere's comes to mind).  He does report he has plans to consult an ENT for his vertigo issues as well (which I agree is a good idea).  Per OT, he had some episodes of shaking with her in the hallway that did not appear to be associated with any drops in his BP (she took them serially during her session).  PT will attempt to come back later to finish my assessment.    Follow Up Recommendations  Other (comment);Home health PT (vestibular therapy)     Equipment Recommendations  None recommended by PT    Recommendations for Other Services       Precautions / Restrictions Precautions Precautions: Fall Precaution Comments: Whole body shakes; pt keeping a log of each  episode    Mobility  Bed Mobility Overal bed mobility: Needs Assistance Bed Mobility: Sit to Supine     Supine to sit: Supervision     General bed mobility comments: Supervision for safety    Transfers Overall transfer level: Needs assistance Equipment used: 1 person hand held assist Transfers: Sit to/from Stand;Stand Pivot Transfers Sit to Stand: Min assist Stand pivot transfers: Min assist       General transfer comment: Min assist to stand and pivot without assistive device from recliner over to bed right next to chair.  Ambulation/Gait             General Gait Details: NT- MRI transporter came to get him.   Stairs             Wheelchair Mobility    Modified Rankin (Stroke Patients Only) Modified Rankin (Stroke Patients Only) Pre-Morbid Rankin Score: No significant disability Modified Rankin: Moderately severe disability     Balance Overall balance assessment: Needs assistance Sitting-balance support: Feet supported;Bilateral upper extremity supported;No upper extremity supported Sitting balance-Leahy Scale: Fair     Standing balance support: Bilateral upper extremity supported Standing balance-Leahy Scale: Fair Standing balance comment: Able          10/04/20 1256  Symptom Behavior  Subjective history of current problem pt reports long standing h/o vertigo which he describes as tipping to the left.  He reports progressive left ear hearing loss, (+) tinnitus, and fullness.  His vertigo is not spinning and has increased in freq and intensity.  He report (+) left sided  neck pain and h/o HAs which he did not correlate with his dizziness episodes.  He reports he wears glasses to drive (but does not drive currently), and has has bil cataract surgery.  He has double vision when he looks up and to the left (side by side in a diagonal pattern).  He reports he has seen all of his specialist (primary, heart, eye, neuro) re: his dizziness and has plans to go  to an ENT.  He takes regular decongestants and mucinex (every day) as he reports he feels it helps with the feeling that his eustacian tubes are clogged.  Type of Dizziness  Comment (tipping to the left)  Frequency of Dizziness near constant  Duration of Dizziness long  Symptom Nature Intermittent;Variable;Positional  Aggravating Factors Activity in general  Relieving Factors Lying supine;Slow movements;Rest  Progression of Symptoms Worse  History of similar episodes yes for >1 year  Oculomotor Exam  Oculomotor Alignment Normal  Spontaneous Absent  Gaze-induced  Absent  Smooth Pursuits Comment  Comment smooth pursuits elicited what I would call a spastic nystagmus moment where his eyes would shake momentarily, but not in any particular direction.  Positional Testing  Dix-Hallpike  (no reports of dizziness with transition back to supine in bed)                      Cognition Arousal/Alertness: Awake/alert Behavior During Therapy: WFL for tasks assessed/performed Overall Cognitive Status: Within Functional Limits for tasks assessed                                 General Comments: Very agreeable to therapy and aware of fucntional performance      Exercises      General Comments        Pertinent Vitals/Pain Pain Assessment: Faces Faces Pain Scale: Hurts little more Pain Location: head Pain Descriptors / Indicators: Aching Pain Intervention(s): Limited activity within patient's tolerance;Monitored during session;Repositioned;Premedicated before session    Home Living                      Prior Function            PT Goals (current goals can now be found in the care plan section) Acute Rehab PT Goals Patient Stated Goal: to go home Progress towards PT goals: Progressing toward goals    Frequency    Min 4X/week      PT Plan Frequency needs to be updated;Discharge plan needs to be updated    Co-evaluation               AM-PAC PT "6 Clicks" Mobility   Outcome Measure  Help needed turning from your back to your side while in a flat bed without using bedrails?: None Help needed moving from lying on your back to sitting on the side of a flat bed without using bedrails?: None Help needed moving to and from a bed to a chair (including a wheelchair)?: A Little Help needed standing up from a chair using your arms (e.g., wheelchair or bedside chair)?: A Little Help needed to walk in hospital room?: A Little Help needed climbing 3-5 steps with a railing? : A Little 6 Click Score: 20    End of Session   Activity Tolerance: Patient tolerated treatment well Patient left: in bed;Other (comment) (transporter coming in to take him to MRI)   PT Visit Diagnosis: Unsteadiness  on feet (R26.81)     Time: 3419-6222 PT Time Calculation (min) (ACUTE ONLY): 14 min  Charges:  $Therapeutic Activity: 8-22 mins                     Corinna Capra, PT, DPT  Acute Rehabilitation Ortho Tech Supervisor [phone] pager [phone]) [phone] office

## 2020-10-05 ENCOUNTER — Observation Stay (HOSPITAL_COMMUNITY): Payer: No Typology Code available for payment source

## 2020-10-05 DIAGNOSIS — I739 Peripheral vascular disease, unspecified: Secondary | ICD-10-CM

## 2020-10-05 DIAGNOSIS — Z9884 Bariatric surgery status: Secondary | ICD-10-CM | POA: Diagnosis not present

## 2020-10-05 DIAGNOSIS — R251 Tremor, unspecified: Secondary | ICD-10-CM | POA: Diagnosis present

## 2020-10-05 DIAGNOSIS — F1110 Opioid abuse, uncomplicated: Secondary | ICD-10-CM | POA: Diagnosis present

## 2020-10-05 DIAGNOSIS — Z9109 Other allergy status, other than to drugs and biological substances: Secondary | ICD-10-CM | POA: Diagnosis not present

## 2020-10-05 DIAGNOSIS — R2681 Unsteadiness on feet: Secondary | ICD-10-CM | POA: Diagnosis present

## 2020-10-05 DIAGNOSIS — I480 Paroxysmal atrial fibrillation: Secondary | ICD-10-CM | POA: Diagnosis present

## 2020-10-05 DIAGNOSIS — F4310 Post-traumatic stress disorder, unspecified: Secondary | ICD-10-CM | POA: Diagnosis present

## 2020-10-05 DIAGNOSIS — Z888 Allergy status to other drugs, medicaments and biological substances status: Secondary | ICD-10-CM | POA: Diagnosis not present

## 2020-10-05 DIAGNOSIS — Z66 Do not resuscitate: Secondary | ICD-10-CM | POA: Diagnosis present

## 2020-10-05 DIAGNOSIS — G629 Polyneuropathy, unspecified: Secondary | ICD-10-CM | POA: Diagnosis present

## 2020-10-05 DIAGNOSIS — Z886 Allergy status to analgesic agent status: Secondary | ICD-10-CM | POA: Diagnosis not present

## 2020-10-05 DIAGNOSIS — R531 Weakness: Secondary | ICD-10-CM | POA: Diagnosis present

## 2020-10-05 DIAGNOSIS — W19XXXA Unspecified fall, initial encounter: Secondary | ICD-10-CM | POA: Diagnosis present

## 2020-10-05 DIAGNOSIS — I959 Hypotension, unspecified: Secondary | ICD-10-CM | POA: Diagnosis present

## 2020-10-05 DIAGNOSIS — G894 Chronic pain syndrome: Secondary | ICD-10-CM | POA: Diagnosis present

## 2020-10-05 DIAGNOSIS — K219 Gastro-esophageal reflux disease without esophagitis: Secondary | ICD-10-CM | POA: Diagnosis present

## 2020-10-05 DIAGNOSIS — Z20822 Contact with and (suspected) exposure to covid-19: Secondary | ICD-10-CM | POA: Diagnosis present

## 2020-10-05 DIAGNOSIS — G4733 Obstructive sleep apnea (adult) (pediatric): Secondary | ICD-10-CM | POA: Diagnosis present

## 2020-10-05 DIAGNOSIS — Z880 Allergy status to penicillin: Secondary | ICD-10-CM | POA: Diagnosis not present

## 2020-10-05 DIAGNOSIS — Z950 Presence of cardiac pacemaker: Secondary | ICD-10-CM | POA: Diagnosis not present

## 2020-10-05 DIAGNOSIS — I071 Rheumatic tricuspid insufficiency: Secondary | ICD-10-CM | POA: Diagnosis present

## 2020-10-05 DIAGNOSIS — Q600 Renal agenesis, unilateral: Secondary | ICD-10-CM | POA: Diagnosis not present

## 2020-10-05 DIAGNOSIS — I472 Ventricular tachycardia: Secondary | ICD-10-CM | POA: Diagnosis present

## 2020-10-05 DIAGNOSIS — F329 Major depressive disorder, single episode, unspecified: Secondary | ICD-10-CM | POA: Diagnosis present

## 2020-10-05 DIAGNOSIS — I4510 Unspecified right bundle-branch block: Secondary | ICD-10-CM | POA: Diagnosis present

## 2020-10-05 LAB — CK: Total CK: 46 U/L — ABNORMAL LOW (ref 49–397)

## 2020-10-05 LAB — MAGNESIUM: Magnesium: 2.1 mg/dL (ref 1.7–2.4)

## 2020-10-05 LAB — POTASSIUM: Potassium: 4.9 mmol/L (ref 3.5–5.1)

## 2020-10-05 MED ORDER — BUPRENORPHINE HCL-NALOXONE HCL 2-0.5 MG SL SUBL: 1.0000 | SUBLINGUAL_TABLET | Freq: Every day | SUBLINGUAL | Status: DC

## 2020-10-05 NOTE — Discharge Summary (Signed)
Physician Discharge Summary  Jack Neal XBJ:[id number] DOB: 1945-09-13 DOA: 10/01/2020  PCP: [facility]  Admit date: 10/01/2020 Discharge date: 10/05/2020  Admitted From: Home Disposition: Home  Recommendations for Outpatient Follow-up:  Follow up with PCP in 1 week Follow up with Neurology Please follow up on the following pending results: None  Home Health: PT/OT Equipment/Devices: None  Discharge Condition: Stable CODE STATUS: DNR Diet recommendation: Regular diet  Brief/Interim Summary:  Admission HPI written by Marinda Elk, MD   HPI:     75 year old male with past medical history of gastric bypass, atrial fibrillation (not on anticoagulation), status post pacemaker (original placed in 2002, changed in 2012, now has Medtronic Model [phone]), obstructive sleep apnea on CPAP, PTSD, major depressive disorder, panic disorder, chronic pain syndrome with opiate dependence who presents to [facility] emergency department after experiencing episodes of severe generalized weakness and fatigue.  Patient explains that typically on an average day he will walk 2 miles a day.  He typically uses a Rollator ever since he began to develop episodes of intermittent vertigo and unsteady gait.  Patient explains that for approximately past 8 months he has been evaluated by neurology and his primary care provider at the [facility] hospital without a determined etiology of his episodes of vertigo and difficulty with ambulation.  Patient explains that he he had just begun his walk the morning of 7/19 when he began to feel an episode of intense weakness in both of his legs.  Sat down immediately but explains that he felt that "everything looks sideways" for approximately 5 minutes.  Patient denies any focal weakness, headache, changes in vision or slurred speech.  Patient then began to slowly stand up and try to ambulate home and was able to do so however he continued to feel  extremely weak and fatigued.  It is at this point the patient decided to go to [facility] emergency department for evaluation.  Upon evaluation in the emergency department patient seen neurologically intact.  Noncontrast CT head was performed revealing suggestion of asymmetric density of the occipital lobe with the left diminished compared to the right.  It was unclear as to whether or not this is true secondary to subacute ischemia or due to the technical quality of the images due to asymmetric positioning.  Considering patient's history of atrial fibrillation and multiple other comorbidities Case was discussed with Dr. Derry Lory with neurology who recommended transferring the patient to [facility] for formal neurology consultation and MRI brain with and without contrast.  Due to patient's cardiac device MRI will have to be performed at [facility].  The hospitalist group was then called to assess the patient for admission to the hospital.   Hospital course:  Involuntary movements Weakness Non-specific. Neurology consulted. CT head was concerning for possible stroke. MRI without acute process to explain symptoms and was negative for previously described area of uncertainty seen on CT head. Tremors are making it difficult for patient to ambulate safely. EEG without evidence of seizures. Unable to give a diagnosis at time of discharge. CK normal. PT/OT recommending home health.   Moderate/sever tricuspid valve regurgitation Noted on Transthoracic Echocardiogram this admission. Patient will need outpatient cardiology follow-up.   Paroxysmal atrial fibrillation Not on anticoagulation or rate/rhythm control medication. S/p PPM   OSA on CPAP Continue CPAP qhs   History of gastric bypass/celiac disease Continue gluten free diet   PTSD Depression Anxiety Patient is on a high dose of  Cymbalta. No recent changes and he has been on this dose for years. Continue Cymbalta   Opioid  abuse Chronic pain Listed in care everywhere. Currently on Suboxone as an outpatient. Continue Suboxone  Non-sustained ventricular tachycardia One episode. Asymptomatic. Not related with movement episodes or weakness when ambulating. Potassium and magnesium normal.  Discharge Diagnoses:  Principal Problem:   Generalized weakness Active Problems:   Unsteady gait when walking   AF (paroxysmal atrial fibrillation) ([name])   Presence of cardiac pacemaker   Peripheral polyneuropathy   Major depressive disorder   OSA on CPAP   Chronic pain syndrome   Fall    Discharge Instructions  Discharge Instructions     Ambulatory referral to Neurology   Complete by: As directed    An appointment is requested in approximately: 6 wks   Increase activity slowly   Complete by: As directed       Allergies as of 10/05/2020       Reactions   Amphetamine Rash, Other (See Comments)   Altered mental status- amnesia (also)   Diazepam Rash   Other reaction(s): Amnesia, Mental Status Changes (intolerance), Other amnesia   Zoloft [sertraline] Anxiety, Rash, Other (See Comments)   Panic attacks and Cardiovascular Arrest (ALSO)   Gluten Meal Diarrhea   Penicillin G Rash        Medication List     TAKE these medications    buprenorphine-naloxone 2-0.5 mg Subl SL tablet Commonly known as: SUBOXONE Place 1 tablet under the tongue daily.   doxepin 10 MG capsule Commonly known as: SINEQUAN Take 1 capsule by mouth. 1 hour before bedtime   DULoxetine 30 MG capsule Commonly known as: CYMBALTA Take 1 capsule by mouth in the morning, at noon, and at bedtime.   gabapentin 300 MG capsule Commonly known as: NEURONTIN Take 1 capsule by mouth 3 (three) times daily.   hydrOXYzine 10 MG tablet Commonly known as: ATARAX/VISTARIL Take 2 tablets by mouth at bedtime.   lidocaine 5 % Commonly known as: LIDODERM Place 1 patch onto the skin. Apply 1 patch for 12 hours and then remove for 12 hours    loperamide 1 MG/5ML solution Commonly known as: IMODIUM Take 5 mLs by mouth daily as needed for diarrhea or loose stools.   midodrine 2.5 MG tablet Commonly known as: PROAMATINE Take 1 tablet by mouth 3 (three) times daily.        Follow-up Information     [facility] Follow up.   Specialty: Home Health Services Why: The home health agency will contact you for the first home visit. Contact information: [address] [place] [address] [phone]         [facility] Follow up in 1 week(s).   Why: For hospital follow-up Contact information: [address] [facility] [place] [address] [phone]         [facility]. Schedule an appointment as soon as possible for a visit.   Specialty: Neurology Why: For hospital follow-up Contact information: [address] [place] [address] [phone]               Allergies  Allergen Reactions   Amphetamine Rash and Other (See Comments)    Altered mental status- amnesia (also)   Diazepam Rash    Other reaction(s): Amnesia, Mental Status Changes (intolerance), Other amnesia    Zoloft [Sertraline] Anxiety, Rash and Other (See Comments)    Panic attacks and Cardiovascular Arrest (ALSO)  Gluten Meal Diarrhea   Penicillin G Rash    Consultations: Neurology   Procedures/Studies: CT HEAD WO CONTRAST  Result Date: 10/01/2020 CLINICAL DATA:  Neuro deficit, acute, stroke suspected Onset of weakness today with several falls and speech difficulty. EXAM: CT HEAD WITHOUT CONTRAST TECHNIQUE: Contiguous axial images were obtained from the base of the skull through the vertex without intravenous contrast. COMPARISON:  None. FINDINGS: Brain: Suggestion of asymmetric density of the occipital lobes, with left diminished compared to right. It is unclear if this is a true finding or related to asymmetric positioning. No other  areas suggestive of ischemia. No hemorrhage. No hydrocephalus. Brain volume is normal for age. No midline shift or mass effect. No subdural or extra-axial collection Vascular: No hyperdense vessel or unexpected calcification. Skull: No fracture or focal lesion. Sinuses/Orbits: Paranasal sinuses and mastoid air cells are clear. The visualized orbits are unremarkable. Bilateral cataract resection. Other: None. IMPRESSION: 1. Suggestion of asymmetric density of the occipital lobes, with left diminished compared to right. It is unclear if this is a true finding, which may represent subacute ischemia, or related to asymmetric positioning. Consider further evaluation with MRI. 2. No hemorrhage. Electronically Signed   By: Narda Rutherford M.D.   On: 10/01/2020 16:24   MR BRAIN W WO CONTRAST  Result Date: 10/04/2020 CLINICAL DATA:  Neuro deficit, acute, stroke suspected. Dizziness and weakness. EXAM: MRI HEAD WITHOUT AND WITH CONTRAST TECHNIQUE: Multiplanar, multiecho pulse sequences of the brain and surrounding structures were obtained without and with intravenous contrast. CONTRAST:  6mL GADAVIST GADOBUTROL 1 MMOL/ML IV SOLN COMPARISON:  Head CT 10/01/2020 FINDINGS: Brain: There is no evidence of an acute infarct, intracranial hemorrhage, mass, midline shift, or extra-axial fluid collection. Small T2 hyperintensities in the cerebral white matter bilaterally are nonspecific but compatible with mild chronic small vessel ischemic disease. There is mild generalized cerebral atrophy. No abnormality is seen to correspond to the asymmetric hypoattenuation of the left occipital lobe on the recent CT which was likely artifactual. No abnormal enhancement is identified. Vascular: Major intracranial vascular flow voids are preserved. Skull and upper cervical spine: No suspicious marrow lesion. Disc and asymmetrically advanced left facet degeneration at C3-4 and C4-5. Sinuses/Orbits: Bilateral cataract extraction. Paranasal  sinuses and mastoid air cells are clear. Other: None. IMPRESSION: 1. No acute intracranial abnormality. 2. Mild chronic small vessel ischemic disease. Electronically Signed   By: Sebastian Ache M.D.   On: 10/04/2020 14:27   VAS [place] ABI WITH/WO TBI  Result Date: 10/05/2020  LOWER EXTREMITY DOPPLER STUDY Patient Name:  Jack Neal  Date of Exam:   10/05/2020 Medical Rec #: [id number]    Accession #:    [id number] Date of Birth: 11/24/1945    Patient Gender: M Patient Age:   74Y Exam Location:  [facility] Procedure:      VAS [place] ABI WITH/WO TBI Referring Phys: [id number] Jaedan Schuman A Lekendrick Alpern --------------------------------------------------------------------------------  Indications: Claudication. High Risk Factors: No history of smoking. Other Factors: Afib, PM, peripheral polyneuropathy.  Comparison Study: No previous exams Performing Technologist: Hill, Jody RVT, RDMS  Examination Guidelines: A complete evaluation includes at minimum, Doppler waveform signals and systolic blood pressure reading at the level of bilateral brachial, anterior tibial, and posterior tibial arteries, when vessel segments are accessible. Bilateral testing is considered an integral part of a complete examination. Photoelectric Plethysmograph (PPG) waveforms and toe systolic pressure readings are included as required and additional duplex testing as needed. Limited examinations for reoccurring indications may be performed as  noted.  ABI Findings: +--------+------------------+-----+---------+--------+ Right   Rt Pressure (mmHg)IndexWaveform Comment  +--------+------------------+-----+---------+--------+ [id number]                    triphasic         +--------+------------------+-----+---------+--------+ PTA     137               1.21 triphasic         +--------+------------------+-----+---------+--------+ DP      120               1.06 biphasic          +--------+------------------+-----+---------+--------+  +--------+------------------+-----+---------+-------+ Left    Lt Pressure (mmHg)IndexWaveform Comment +--------+------------------+-----+---------+-------+ [id number]                    triphasic        +--------+------------------+-----+---------+-------+ PTA     125               1.11 biphasic         +--------+------------------+-----+---------+-------+ DP      115               1.02 biphasic         +--------+------------------+-----+---------+-------+ +-------+-----------+-----------+------------+------------+ ABI/TBIToday's ABIToday's TBIPrevious ABIPrevious TBI +-------+-----------+-----------+------------+------------+ Right  1.21                                           +-------+-----------+-----------+------------+------------+ Left   1.11                                           +-------+-----------+-----------+------------+------------+  Summary: Right: Resting right ankle-brachial index is within normal range. No evidence of significant right lower extremity arterial disease. Left: Resting left ankle-brachial index is within normal range. No evidence of significant left lower extremity arterial disease.  *See table(s) above for measurements and observations.     Preliminary    ECHOCARDIOGRAM COMPLETE  Result Date: 10/02/2020    ECHOCARDIOGRAM REPORT   Patient Name:   Jack Neal Date of Exam: 10/02/2020 Medical Rec #:  [id number]   Height:       71.0 in Accession #:    [id number]  Weight:       170.0 lb Date of Birth:  [birth year]   BSA:          1.968 m Patient Age:    74 years    BP:           144/82 mmHg Patient Gender: M           HR:           81 bpm. Exam Location:  Inpatient Procedure: 2D Echo, 3D Echo, Cardiac Doppler and Color Doppler Indications:    R06.02 SOB  History:        Patient has no prior history of Echocardiogram examinations.                 Pacemaker and Abnormal ECG, Arrythmias:Atrial Fibrillation,                  Signs/Symptoms:Shortness of Breath; Risk Factors:Sleep Apnea.  Sonographer:    Sheralyn Boatman RDCS Referring Phys: [id number] Deno Lunger [facility] IMPRESSIONS  1. Left ventricular ejection fraction, by estimation, is 55%. The left ventricle has normal  function. The left ventricle has no regional wall motion abnormalities. There is mild left ventricular hypertrophy. Left ventricular diastolic parameters are indeterminate.  2. Right ventricular systolic function is normal. The right ventricular size is mildly enlarged. There is mildly elevated pulmonary artery systolic pressure. The estimated right ventricular systolic pressure is 38.8 mmHg.  3. Left atrial size was moderately dilated.  4. Right atrial size was severely dilated.  5. The mitral valve is normal in structure. Mild mitral valve regurgitation. No evidence of mitral stenosis. Moderate mitral annular calcification.  6. The tricuspid valve is abnormal. Tricuspid valve regurgitation is moderate to severe.  7. The aortic valve is tricuspid. Aortic valve regurgitation is not visualized. No aortic stenosis is present.  8. Aortic dilatation noted. There is mild dilatation of the aortic root, measuring 39 mm.  9. The inferior vena cava is dilated in size with <50% respiratory variability, suggesting right atrial pressure of 15 mmHg. 10. The patient appears to be in atrial fibrillation. FINDINGS  Left Ventricle: Left ventricular ejection fraction, by estimation, is 55%. The left ventricle has normal function. The left ventricle has no regional wall motion abnormalities. The left ventricular internal cavity size was normal in size. There is mild left ventricular hypertrophy. Left ventricular diastolic parameters are indeterminate. Right Ventricle: The right ventricular size is mildly enlarged. No increase in right ventricular wall thickness. Right ventricular systolic function is normal. There is mildly elevated pulmonary artery systolic pressure. The tricuspid regurgitant  velocity is 2.44 m/s, and with an assumed right atrial pressure of 15 mmHg, the estimated right ventricular systolic pressure is 38.8 mmHg. Left Atrium: Left atrial size was moderately dilated. Right Atrium: Right atrial size was severely dilated. Pericardium: There is no evidence of pericardial effusion. Mitral Valve: The mitral valve is normal in structure. Moderate mitral annular calcification. Mild mitral valve regurgitation. No evidence of mitral valve stenosis. Tricuspid Valve: The tricuspid valve is abnormal. Tricuspid valve regurgitation is moderate to severe. Aortic Valve: The aortic valve is tricuspid. Aortic valve regurgitation is not visualized. No aortic stenosis is present. Pulmonic Valve: The pulmonic valve was normal in structure. Pulmonic valve regurgitation is trivial. Aorta: Aortic dilatation noted. There is mild dilatation of the aortic root, measuring 39 mm. Venous: The inferior vena cava is dilated in size with less than 50% respiratory variability, suggesting right atrial pressure of 15 mmHg. IAS/Shunts: No atrial level shunt detected by color flow Doppler. Additional Comments: A device lead is visualized in the right ventricle.  LEFT VENTRICLE PLAX 2D LVIDd:         3.80 cm     Diastology LVIDs:         2.80 cm     LV e' medial:    9.14 cm/s LV PW:         1.17 cm     LV E/e' medial:  9.5 LV IVS:        1.23 cm     LV e' lateral:   14.40 cm/s LVOT diam:     1.90 cm     LV E/e' lateral: 6.0 LV SV:         48 LV SV Index:   24 LVOT Area:     2.84 cm  LV Volumes (MOD) LV vol d, MOD A2C: 69.7 ml LV vol d, MOD A4C: 63.7 ml LV vol s, MOD A2C: 33.6 ml LV vol s, MOD A4C: 31.7 ml LV SV MOD A2C:     36.1 ml LV SV MOD  A4C:     63.7 ml LV SV MOD BP:      36.4 ml RIGHT VENTRICLE            IVC RV S prime:     8.81 cm/s  IVC diam: 3.00 cm TAPSE (M-mode): 1.8 cm LEFT ATRIUM             Index       RIGHT ATRIUM           Index LA diam:        4.30 cm 2.19 cm/m  RA Area:     31.70 cm LA Vol (A2C):   91.5  ml 46.50 ml/m RA Volume:   122.00 ml 62.00 ml/m LA Vol (A4C):   63.8 ml 32.42 ml/m LA Biplane Vol: 84.4 ml 42.89 ml/m  AORTIC VALVE             PULMONIC VALVE LVOT Vmax:   82.30 cm/s  PR End Diast Vel: 2.24 msec LVOT Vmean:  57.000 cm/s LVOT VTI:    0.168 m  AORTA Ao Root diam: 3.80 cm Ao Asc diam:  3.50 cm MITRAL VALVE               TRICUSPID VALVE MV Area (PHT): 3.77 cm    TR Peak grad:   23.8 mmHg MV Decel Time: 201 msec    TR Vmax:        244.00 cm/s MR PISA:        0.25 cm MR PISA Radius: 0.20 cm    SHUNTS MV E velocity: 87.00 cm/s  Systemic VTI:  0.17 m                            Systemic Diam: 1.90 cm Marca Ancona MD Electronically signed by Marca Ancona MD Signature Date/Time: 10/02/2020/2:47:55 PM    Final      Subjective: Feels much better today. Movements have significantly decreased.  Discharge Exam: Vitals:   10/05/20 0953 10/05/20 1245  BP: 103/69 111/78  Pulse: 80 80  Resp: 17 15  Temp: 97.7 F (36.5 C) 97.8 F (36.6 C)  SpO2: 96% 96%   Vitals:   10/05/20 0000 10/05/20 0331 10/05/20 0953 10/05/20 1245  BP: 106/65 97/69 103/69 111/78  Pulse: 72 84 80 80  Resp: 18 17 17 15   Temp: (!) 97.5 F (36.4 C) 97.9 F (36.6 C) 97.7 F (36.5 C) 97.8 F (36.6 C)  TempSrc: Oral Oral Oral Oral  SpO2: 94% 99% 96% 96%  Weight:      Height:        General: Pt is alert, awake, not in acute distress Cardiovascular: RRR, S1/S2 +, no rubs, no gallops Respiratory: CTA bilaterally, no wheezing, no rhonchi Abdominal: Soft, NT, ND, bowel sounds + Extremities: no edema, no cyanosis    The results of significant diagnostics from this hospitalization (including imaging, microbiology, ancillary and laboratory) are listed below for reference.     Microbiology: Recent Results (from the past 240 hour(s))  Resp Panel by RT-PCR (Flu A&B, Covid) Nasopharyngeal Swab     Status: None   Collection Time: 10/01/20  3:36 PM   Specimen: Nasopharyngeal Swab; Nasopharyngeal(NP) swabs in vial  transport medium  Result Value Ref Range Status   SARS Coronavirus 2 by RT PCR NEGATIVE NEGATIVE Final    Comment: (NOTE) SARS-CoV-2 target nucleic acids are NOT DETECTED.  The SARS-CoV-2 RNA is generally detectable in upper respiratory specimens during  the acute phase of infection. The lowest concentration of SARS-CoV-2 viral copies this assay can detect is 138 copies/mL. A negative result does not preclude SARS-Cov-2 infection and should not be used as the sole basis for treatment or other patient management decisions. A negative result may occur with  improper specimen collection/handling, submission of specimen other than nasopharyngeal swab, presence of viral mutation(s) within the areas targeted by this assay, and inadequate number of viral copies(<138 copies/mL). A negative result must be combined with clinical observations, patient history, and epidemiological information. The expected result is Negative.  Fact Sheet for Patients:  BloggerCourse.comhttps://www.fda.gov/media/152166/download  Fact Sheet for Healthcare Providers:  SeriousBroker.ithttps://www.fda.gov/media/152162/download  This test is no t yet approved or cleared by the [place] FDA and  has been authorized for detection and/or diagnosis of SARS-CoV-2 by FDA under an Emergency Use Authorization (EUA). This EUA will remain  in effect (meaning this test can be used) for the duration of the COVID-19 declaration under Section 564(b)(1) of the Act, 21 U.S.C.section 360bbb-3(b)(1), unless the authorization is terminated  or revoked sooner.       Influenza A by PCR NEGATIVE NEGATIVE Final   Influenza B by PCR NEGATIVE NEGATIVE Final    Comment: (NOTE) The Xpert Xpress SARS-CoV-2/FLU/RSV plus assay is intended as an aid in the diagnosis of influenza from Nasopharyngeal swab specimens and should not be used as a sole basis for treatment. Nasal washings and aspirates are unacceptable for Xpert Xpress SARS-CoV-2/FLU/RSV testing.  Fact  Sheet for Patients: BloggerCourse.comhttps://www.fda.gov/media/152166/download  Fact Sheet for Healthcare Providers: SeriousBroker.ithttps://www.fda.gov/media/152162/download  This test is not yet approved or cleared by the [place] FDA and has been authorized for detection and/or diagnosis of SARS-CoV-2 by FDA under an Emergency Use Authorization (EUA). This EUA will remain in effect (meaning this test can be used) for the duration of the COVID-19 declaration under Section 564(b)(1) of the Act, 21 U.S.C. section 360bbb-3(b)(1), unless the authorization is terminated or revoked.  Performed at [facility], [address] [place], [place] [id number]      Labs: BNP (last 3 results) No results for input(s): BNP in the last 8760 hours. Basic Metabolic Panel: Recent Labs  Lab 10/01/20 1517 10/01/20 1553 10/02/20 0301 10/04/20 2258  NA 139 137 139  --   K 4.3 4.2 4.0 4.9  CL 95* 95* 99  --   CO2 34*  --  32  --   GLUCOSE 79 79 91  --   BUN 22 21 17   --   CREATININE 0.72 0.80 0.70  --   CALCIUM 9.1  --  8.9  --   MG  --   --   --  2.1   Liver Function Tests: Recent Labs  Lab 10/01/20 1517 10/02/20 0301  AST 34 37  ALT 18 18  ALKPHOS 138* 137*  BILITOT 1.1 1.2  PROT 7.3 7.2  ALBUMIN 4.1 3.9   No results for input(s): LIPASE, AMYLASE in the last 168 hours. No results for input(s): AMMONIA in the last 168 hours. CBC: Recent Labs  Lab 10/01/20 1517 10/01/20 1553 10/02/20 0301 10/03/20 0541 10/04/20 0547  WBC 5.3  --  3.8* 3.1* 3.7*  NEUTROABS 2.1  --  1.2*  --   --   HGB 12.0* 12.9* 12.3* 11.6* 11.3*  HCT 38.2* 38.0* 39.6 36.6* 35.6*  MCV 102.1*  --  103.4* 102.2* 100.0  PLT 153  --  142* 135* 140*   Cardiac Enzymes: Recent Labs  Lab 10/05/20  0353  CKTOTAL 46*   BNP: Invalid input(s): POCBNP CBG: No results for input(s): GLUCAP in the last 168 hours. D-Dimer No results for input(s): DDIMER in the last 72 hours. Hgb A1c No results for input(s): HGBA1C in  the last 72 hours. Lipid Profile No results for input(s): CHOL, HDL, LDLCALC, TRIG, CHOLHDL, LDLDIRECT in the last 72 hours. Thyroid function studies Recent Labs    10/04/20 0547  TSH 1.543   Anemia work up No results for input(s): VITAMINB12, FOLATE, FERRITIN, TIBC, IRON, RETICCTPCT in the last 72 hours. Urinalysis    Component Value Date/Time   COLORURINE YELLOW 10/01/2020 1631   APPEARANCEUR CLEAR 10/01/2020 1631   LABSPEC 1.014 10/01/2020 1631   PHURINE 7.0 10/01/2020 1631   GLUCOSEU NEGATIVE 10/01/2020 1631   HGBUR NEGATIVE 10/01/2020 1631   BILIRUBINUR NEGATIVE 10/01/2020 1631   KETONESUR NEGATIVE 10/01/2020 1631   PROTEINUR NEGATIVE 10/01/2020 1631   NITRITE NEGATIVE 10/01/2020 1631   LEUKOCYTESUR NEGATIVE 10/01/2020 1631   [facility] Invalid input(s): PROCALCITONIN,  WBC,  LACTICIDVEN Microbiology Recent Results (from the past 240 hour(s))  Resp Panel by RT-PCR (Flu A&B, Covid) Nasopharyngeal Swab     Status: None   Collection Time: 10/01/20  3:36 PM   Specimen: Nasopharyngeal Swab; Nasopharyngeal(NP) swabs in vial transport medium  Result Value Ref Range Status   SARS Coronavirus 2 by RT PCR NEGATIVE NEGATIVE Final    Comment: (NOTE) SARS-CoV-2 target nucleic acids are NOT DETECTED.  The SARS-CoV-2 RNA is generally detectable in upper respiratory specimens during the acute phase of infection. The lowest concentration of SARS-CoV-2 viral copies this assay can detect is 138 copies/mL. A negative result does not preclude SARS-Cov-2 infection and should not be used as the sole basis for treatment or other patient management decisions. A negative result may occur with  improper specimen collection/handling, submission of specimen other than nasopharyngeal swab, presence of viral mutation(s) within the areas targeted by this assay, and inadequate number of viral copies(<138 copies/mL). A negative result must be combined with clinical observations, patient history,  and epidemiological information. The expected result is Negative.  Fact Sheet for Patients:  BloggerCourse.com  Fact Sheet for Healthcare Providers:  SeriousBroker.it  This test is no t yet approved or cleared by the [place] FDA and  has been authorized for detection and/or diagnosis of SARS-CoV-2 by FDA under an Emergency Use Authorization (EUA). This EUA will remain  in effect (meaning this test can be used) for the duration of the COVID-19 declaration under Section 564(b)(1) of the Act, 21 U.S.C.section 360bbb-3(b)(1), unless the authorization is terminated  or revoked sooner.       Influenza A by PCR NEGATIVE NEGATIVE Final   Influenza B by PCR NEGATIVE NEGATIVE Final    Comment: (NOTE) The Xpert Xpress SARS-CoV-2/FLU/RSV plus assay is intended as an aid in the diagnosis of influenza from Nasopharyngeal swab specimens and should not be used as a sole basis for treatment. Nasal washings and aspirates are unacceptable for Xpert Xpress SARS-CoV-2/FLU/RSV testing.  Fact Sheet for Patients: BloggerCourse.com  Fact Sheet for Healthcare Providers: SeriousBroker.it  This test is not yet approved or cleared by the [place] FDA and has been authorized for detection and/or diagnosis of SARS-CoV-2 by FDA under an Emergency Use Authorization (EUA). This EUA will remain in effect (meaning this test can be used) for the duration of the COVID-19 declaration under Section 564(b)(1) of the Act, 21 U.S.C. section 360bbb-3(b)(1), unless the authorization is terminated or revoked.  Performed  at [facility], [address], [place], [place] [address]      Time coordinating discharge: 35 minutes  SIGNED:   Jacquelin Hawking, MD [facility] 10/05/2020, 1:56 PM

## 2020-10-05 NOTE — Progress Notes (Signed)
Occupational Therapy Treatment Patient Details Name: Jack Neal MRN: [id number] DOB: [birth year] Today's Date: 10/05/2020    History of present illness 75 year old male presenting to [facility] ED on 10/01/20 after experiencing episodes of severe generalized weakness and fatigue. CT-Suggestion of asymmetric density of the occipital lobes, with  left diminished compared to right.  MRI, however, was negative. PMH including gastric bypass, a-fib, s/p pacemaker , obstructive sleep apnea on CPAP, PTSD, major depressive disorder, panic disorder, chronic pain syndrome with opiate dependence.   OT comments  Pt is able to complete ADLs with min guard assist, but has periods of dizziness with mild LOB.  He reports diplopia with far Rt and Lt gaze (images side by side), and convergence appears to be impaired.  He reports wife can assist him as needed at home.  Updated recommendations to HHOT.    Follow Up Recommendations  Home health OT;Supervision/Assistance - 24 hour    Equipment Recommendations  None recommended by OT    Recommendations for Other Services      Precautions / Restrictions Precautions Precautions: Fall Precaution Comments: h/o falls at home one he relates to vertigo       Mobility Bed Mobility Overal bed mobility: Modified Independent Bed Mobility: Supine to Sit     Supine to sit: Modified independent (Device/Increase time)          Transfers Overall transfer level: Needs assistance Equipment used: Rolling walker (2 wheeled) Transfers: Sit to/from Stand Sit to Stand: Min guard         General transfer comment: Min guard assist for safety    Balance Overall balance assessment: Needs assistance Sitting-balance support: Feet supported;No upper extremity supported Sitting balance-Leahy Scale: Good Sitting balance - Comments: pt reaching down to floor to donn slippers, coming back up to sitting, no issues   Standing balance support: Bilateral upper extremity  supported;Single extremity supported;No upper extremity supported Standing balance-Leahy Scale: Fair Standing balance comment: able to maintain static standing with min guard assist wtihout UE support                           ADL either performed or assessed with clinical judgement   ADL Overall ADL's : Needs assistance/impaired             Lower Body Bathing: Min guard;Sit to/from stand       Lower Body Dressing: Min guard;Sit to/from stand   Toilet Transfer: Min guard;Ambulation;RW Toilet Transfer Details (indicate cue type and reason): Min Guard A for safety Toileting- Clothing Manipulation and Hygiene: Supervision/safety;Sit to/from stand Toileting - Clothing Manipulation Details (indicate cue type and reason): Supervision for safety     Functional mobility during ADLs: Min guard;Rolling walker General ADL Comments: Pt requires min guard assist for safety due to occasional dizziness and LOB     Vision   Additional Comments: Pt reports side by side diplopia with far Lt and Rt gaze.  convergence impaired with impaired abduction of Rt eye   Perception     Praxis      Cognition Arousal/Alertness: Awake/alert Behavior During Therapy: WFL for tasks assessed/performed Overall Cognitive Status: Within Functional Limits for tasks assessed                                 General Comments: Very agreeable to therapy and aware of functional performance; good at setting his limits  Exercises     Shoulder Instructions       General Comments Pt is eager to discharge home and reports wife can assist him with meal prep and IADLs upon discharge    Pertinent Vitals/ Pain       Pain Assessment: No/denies pain  Home Living                                          Prior Functioning/Environment              Frequency  Min 2X/week        Progress Toward Goals  OT Goals(current goals can now be found in the care  plan section)  Progress towards OT goals: Progressing toward goals  Acute Rehab OT Goals Patient Stated Goal: to get answers  Plan Discharge plan needs to be updated    Co-evaluation                 AM-PAC OT "6 Clicks" Daily Activity     Outcome Measure   Help from another person eating meals?: None Help from another person taking care of personal grooming?: A Little Help from another person toileting, which includes using toliet, bedpan, or urinal?: A Little Help from another person bathing (including washing, rinsing, drying)?: A Little Help from another person to put on and taking off regular upper body clothing?: A Little Help from another person to put on and taking off regular lower body clothing?: A Little 6 Click Score: 19    End of Session Equipment Utilized During Treatment: Rolling walker  OT Visit Diagnosis: Unsteadiness on feet (R26.81);History of falling (Z91.81)   Activity Tolerance Patient tolerated treatment well   Patient Left in chair;with call bell/phone within reach;with chair alarm set   Nurse Communication Mobility status        Time: 4696-2952 OT Time Calculation (min): 19 min  Charges: OT General Charges $OT Visit: 1 Visit OT Treatments $Self Care/Home Management : 8-22 mins  Eber Jones OTR/L Acute Rehabilitation Services Pager [phone] Office [phone]    Jeani Hawking M 10/05/2020, 1:39 PM

## 2020-10-05 NOTE — TOC Transition Note (Signed)
Transition of Care [facility]) - CM/SW Discharge Note   Patient Details  Name: Jack Neal MRN: [id number] Date of Birth: May 14, 1945  Transition of Care [facility]) CM/SW Contact:  Lawerance Sabal, RN Phone Number: 10/05/2020, 2:15 PM   Clinical Narrative:    Rondel Jumbo of [place].     Final next level of care: Home w Home Health Services Barriers to Discharge: No Barriers Identified   Patient Goals and CMS Choice   CMS Medicare.gov Compare Post Acute Care list provided to:: Patient Choice offered to / list presented to : Patient  Discharge Placement                       Discharge Plan and Services   Discharge Planning Services: CM Consult Post Acute Care Choice: Home Health                    HH Arranged: PT [facility] Agency: [facility] Date [facility] Agency Contacted: 10/04/20   Representative spoke with at [facility] Agency: Kandee Keen  Social Determinants of Health (SDOH) Interventions     Readmission Risk Interventions No flowsheet data found.

## 2020-10-05 NOTE — Progress Notes (Signed)
PT Cancellation Note  Patient Details Name: Jack Neal MRN: [id number] DOB: [birth year]   Cancelled Treatment:    Reason Eval/Treat Not Completed: Patient at procedure or test/unavailable  Noted new PT order for imminent discharge. Patient currently away for testing. Will see ASAP for discharge planning needs.    Jerolyn Center, PT Pager [phone]  Zena Amos 10/05/2020, 11:14 AM

## 2020-10-05 NOTE — Progress Notes (Signed)
ABI exam has been completed.  Results can be found under chart review under CV PROC. 10/05/2020 11:20 AM Lilla Callejo RVT, RDMS
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
## 2020-10-05 NOTE — Progress Notes (Signed)
Hospitalist gave new orders.  See order page for details.

## 2020-10-05 NOTE — Progress Notes (Signed)
Physical Therapy Treatment Patient Details Name: Jack Neal MRN: [id number] DOB: December 29, 1945 Today's Date: 10/05/2020    History of Present Illness 75 year old male presenting to [facility] ED on 10/01/20 after experiencing episodes of severe generalized weakness and fatigue. CT-Suggestion of asymmetric density of the occipital lobes, with  left diminished compared to right.  MRI, however, was negative. PMH including gastric bypass, a-fib, s/p pacemaker , obstructive sleep apnea on CPAP, PTSD, major depressive disorder, panic disorder, chronic pain syndrome with opiate dependence.    PT Comments    Patient seen to assist with discharge planning. Pt had no tremors throughout ~40 minute session with simulating household distances and movements in kitchen. No dizzy spells and no imbalance (using RW throughout session).   Discussed discharge options and feel pt will get more opportunities for activity/mobility in his home environment than in SNF (where they will want to supervise his mobility). Explained he could still benefit from HHPT and he is in agreement with plan.    Follow Up Recommendations  Home health PT     Equipment Recommendations  None recommended by PT    Recommendations for Other Services       Precautions / Restrictions Precautions Precautions: Fall Precaution Comments: h/o falls at home one he relates to vertigo    Mobility  Bed Mobility Overal bed mobility: Modified Independent Bed Mobility: Supine to Sit     Supine to sit: Modified independent (Device/Increase time)          Transfers Overall transfer level: Needs assistance Equipment used: Rolling walker (2 wheeled) Transfers: Sit to/from Stand Sit to Stand: Min guard         General transfer comment: Min guard assist for safety  Ambulation/Gait Ambulation/Gait assistance: Min guard   Assistive device: Rolling walker (2 wheeled) Gait Pattern/deviations: Step-through pattern Gait velocity: decreased    General Gait Details: Pt self cueing for upright posture. Initially ambulated household distance (simulated distance to kitchen, simulated tasks and time they would take while in the kitchen) and then back to room with no tremors, dizziness or excessive leg fatigue. Patient able then to walk one lap (~150 ft) followed by seated rest and then a second lap.   Stairs             Wheelchair Mobility    Modified Rankin (Stroke Patients Only) Modified Rankin (Stroke Patients Only) Pre-Morbid Rankin Score: No significant disability Modified Rankin: Moderately severe disability     Balance Overall balance assessment: Needs assistance Sitting-balance support: Feet supported;No upper extremity supported Sitting balance-Leahy Scale: Good Sitting balance - Comments: pt reaching down to floor to donn slippers, coming back up to sitting, no issues   Standing balance support: Bilateral upper extremity supported;Single extremity supported;No upper extremity supported Standing balance-Leahy Scale: Fair Standing balance comment: supervision in static standing while pulling his breifs up in the bathroom.                            Cognition Arousal/Alertness: Awake/alert Behavior During Therapy: WFL for tasks assessed/performed Overall Cognitive Status: Within Functional Limits for tasks assessed                                 General Comments: Very agreeable to therapy and aware of functional performance; good at setting his limits      Exercises      General Comments General comments (skin  integrity, edema, etc.): Discussed discharge options including pro's/con's of each (HH vs SNF) and ultimately pt agreed that home is the better option at this time.      Pertinent Vitals/Pain Pain Assessment: No/denies pain    Home Living                      Prior Function            PT Goals (current goals can now be found in the care plan section)  Acute Rehab PT Goals Patient Stated Goal: to get answers Time For Goal Achievement: 10/16/20 Potential to Achieve Goals: Good Progress towards PT goals: Progressing toward goals    Frequency    Min 3X/week      PT Plan Discharge plan needs to be updated    Co-evaluation              AM-PAC PT "6 Clicks" Mobility   Outcome Measure  Help needed turning from your back to your side while in a flat bed without using bedrails?: None Help needed moving from lying on your back to sitting on the side of a flat bed without using bedrails?: None Help needed moving to and from a bed to a chair (including a wheelchair)?: A Little Help needed standing up from a chair using your arms (e.g., wheelchair or bedside chair)?: A Little Help needed to walk in hospital room?: A Little Help needed climbing 3-5 steps with a railing? : A Lot 6 Click Score: 19    End of Session Equipment Utilized During Treatment: Gait belt Activity Tolerance: Patient tolerated treatment well Patient left: with call bell/phone within reach;in chair;with chair alarm set Nurse Communication: Mobility status PT Visit Diagnosis: Muscle weakness (generalized) (M62.81);Difficulty in walking, not elsewhere classified (R26.2);Unsteadiness on feet (R26.81)     Time: 9323-5573 PT Time Calculation (min) (ACUTE ONLY): 39 min  Charges:  $Gait Training: 23-37 mins $Self Care/Home Management: 8-22                      Jerolyn Center, PT Pager [phone]    Zena Amos 10/05/2020, 12:13 PM

## 2020-10-05 NOTE — Discharge Instructions (Addendum)
Jack Neal,  You were in the hospital with shaking/abnormal movements that we were, unfortunately, not able to diagnose. The neurologist has recommended that you follow-up with an outpatient neurologist for further testing/evaluation. Your test to check for poor artery flow in your legs was normal.

## 2020-10-05 NOTE — Plan of Care (Signed)
  Problem: Education: Goal: Knowledge of General Education information will improve Description: Including pain rating scale, medication(s)/side effects and non-pharmacologic comfort measures Outcome: Completed/Met   Problem: Health Behavior/Discharge Planning: Goal: Ability to manage health-related needs will improve Outcome: Completed/Met   Problem: Clinical Measurements: Goal: Ability to maintain clinical measurements within normal limits will improve Outcome: Completed/Met Goal: Will remain free from infection Outcome: Completed/Met Goal: Diagnostic test results will improve Outcome: Completed/Met Goal: Respiratory complications will improve Outcome: Completed/Met Goal: Cardiovascular complication will be avoided Outcome: Completed/Met   Problem: Activity: Goal: Risk for activity intolerance will decrease Outcome: Completed/Met   Problem: Nutrition: Goal: Adequate nutrition will be maintained Outcome: Completed/Met   Problem: Coping: Goal: Level of anxiety will decrease Outcome: Completed/Met   Problem: Elimination: Goal: Will not experience complications related to bowel motility Outcome: Completed/Met Goal: Will not experience complications related to urinary retention Outcome: Completed/Met   Problem: Pain Managment: Goal: General experience of comfort will improve Outcome: Completed/Met   Problem: Safety: Goal: Ability to remain free from injury will improve Outcome: Completed/Met   Problem: Skin Integrity: Goal: Risk for impaired skin integrity will decrease Outcome: Completed/Met   Problem: Education: Goal: Knowledge of disease or condition will improve Outcome: Completed/Met Goal: Knowledge of secondary prevention will improve Outcome: Completed/Met Goal: Knowledge of patient specific risk factors addressed and post discharge goals established will improve Outcome: Completed/Met Goal: Individualized Educational Video(s) Outcome: Completed/Met    Problem: Coping: Goal: Will verbalize positive feelings about self Outcome: Completed/Met Goal: Will identify appropriate support needs Outcome: Completed/Met   Problem: Health Behavior/Discharge Planning: Goal: Ability to manage health-related needs will improve Outcome: Completed/Met   Problem: Self-Care: Goal: Ability to participate in self-care as condition permits will improve Outcome: Completed/Met Goal: Verbalization of feelings and concerns over difficulty with self-care will improve Outcome: Completed/Met Goal: Ability to communicate needs accurately will improve Outcome: Completed/Met   Problem: Nutrition: Goal: Risk of aspiration will decrease Outcome: Completed/Met Goal: Dietary intake will improve Outcome: Completed/Met   Problem: Intracerebral Hemorrhage Tissue Perfusion: Goal: Complications of Intracerebral Hemorrhage will be minimized Outcome: Completed/Met   Problem: Ischemic Stroke/TIA Tissue Perfusion: Goal: Complications of ischemic stroke/TIA will be minimized Outcome: Completed/Met   Problem: Spontaneous Subarachnoid Hemorrhage Tissue Perfusion: Goal: Complications of Spontaneous Subarachnoid Hemorrhage will be minimized Outcome: Completed/Met  Discharge information discussed with patient.  These included, but were not limited to, the following:  prescriptions, activity recommendations, when to call the MD, follow-up appointments,  etc.

## 2020-10-29 ENCOUNTER — Ambulatory Visit (INDEPENDENT_AMBULATORY_CARE_PROVIDER_SITE_OTHER): Payer: No Typology Code available for payment source | Admitting: Cardiology

## 2020-10-29 ENCOUNTER — Other Ambulatory Visit: Payer: Self-pay

## 2020-10-29 ENCOUNTER — Encounter: Payer: Self-pay | Admitting: Cardiology

## 2020-10-29 ENCOUNTER — Encounter: Payer: Self-pay | Admitting: *Deleted

## 2020-10-29 VITALS — BP 122/74 | HR 82 | Ht 71.0 in | Wt 186.6 lb

## 2020-10-29 DIAGNOSIS — I5032 Chronic diastolic (congestive) heart failure: Secondary | ICD-10-CM

## 2020-10-29 DIAGNOSIS — I38 Endocarditis, valve unspecified: Secondary | ICD-10-CM

## 2020-10-29 DIAGNOSIS — R296 Repeated falls: Secondary | ICD-10-CM | POA: Diagnosis not present

## 2020-10-29 DIAGNOSIS — I480 Paroxysmal atrial fibrillation: Secondary | ICD-10-CM

## 2020-10-29 DIAGNOSIS — W19XXXS Unspecified fall, sequela: Secondary | ICD-10-CM

## 2020-10-29 DIAGNOSIS — Z950 Presence of cardiac pacemaker: Secondary | ICD-10-CM | POA: Diagnosis not present

## 2020-10-29 NOTE — Progress Notes (Signed)
Electrophysiology Office Note:    Date:  10/29/2020   ID:  Jack Neal, DOB 06/12/1945, MRN [id number]  PCP:  [facility] Cardiologist:  None  [facility] Electrophysiologist:  None   Referring MD: Minda Meo, PA-C   Chief Complaint: AF  History of Present Illness:    Jack Neal is a 75 y.o. male who presents for an evaluation of AF at the request of [employer], PA-C. Their medical history includes permanent AF s/p AVJ ablation and PPM implant in 2012, traumatic subdural hematoma off OAC. Also with frequent falls according to [facility] records.   He is in clinic with his wife today.  [name] tells me that when he had his subdural hematoma he was on an anticoagulant. He was found unresponsive on the ground with evidence of head trauma c/w a fall. He has not taken [facility] after this event.   He has problems with falling and uses a rollator for stability. He has fallen several times in the past few weeks.  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  Current Medications: Current Meds  Medication Sig   buprenorphine-naloxone (SUBOXONE) 2-0.5 mg SUBL SL tablet Place 1 tablet under the tongue daily.   doxepin (SINEQUAN) 10 MG capsule Take 1 capsule by mouth. 1 hour before bedtime   DULoxetine (CYMBALTA) 30 MG capsule Take 1 capsule by mouth in the morning, at noon, and at bedtime.   gabapentin (NEURONTIN) 300 MG capsule Take 1 capsule by mouth 3  (three) times daily.   levETIRAcetam 250 MG TB3D    lidocaine (LIDODERM) 5 % Place 1 patch onto the skin. Apply 1 patch for 12 hours and then remove for 12 hours   loperamide (IMODIUM) 1 MG/5ML solution Take 5 mLs by mouth daily as needed for diarrhea or loose stools.     Allergies:   Amphetamine, Diazepam, Zoloft [sertraline], Gluten meal, and Penicillin g   Social History   Socioeconomic History   Marital status: Married    Spouse name: Not on file   Number of children: Not on file   Years of education: Not on file   Highest education level: Not on file  Occupational History   Not on file  Tobacco Use   Smoking status: Never   Smokeless tobacco: Never  Substance and Sexual Activity   Alcohol use: Never   Drug use: Never   Sexual activity: Not on file  Other Topics Concern   Not on file  Social History Narrative   Not on file   Social Determinants of Health   Financial Resource Strain: Not on file  Food Insecurity: Not on file  Transportation Needs: Not on file  Physical Activity: Not on file  Stress: Not on file  Social Connections: Not on file     Family History: The patient's family history is negative for Heart disease.  ROS:   Please see the history of present illness.  All other systems reviewed and are negative.  EKGs/Labs/Other Studies Reviewed:    The following studies were reviewed today:  10/02/2020 Echo personally reivewed LV normal, 55% RV normal LA/RA dilated Mild MR Moderate/severe TR. Possibly related to RV lead impingement   EKG:  The ekg ordered today demonstrates sinus rhythm with ventricular pacing.  Recent Labs: 10/02/2020: ALT 18; BUN 17; Creatinine, Ser 0.70; Sodium 139 10/04/2020: Hemoglobin 11.3; Magnesium 2.1; Platelets 140; Potassium 4.9; TSH 1.543  Recent Lipid Panel    Component Value Date/Time   CHOL 108 10/02/2020 0301   TRIG 27 10/02/2020 0301   HDL 48 10/02/2020 0301   CHOLHDL 2.3 10/02/2020 0301   VLDL 5  10/02/2020 0301   LDLCALC 55 10/02/2020 0301    Physical Exam:    VS:  BP 122/74   Pulse 82   Ht 5\' 11"  (1.803 m)   Wt 186 lb 9.6 oz (84.6 kg)   SpO2 96%   BMI 26.03 kg/m     Wt Readings from Last 3 Encounters:  10/29/20 186 lb 9.6 oz (84.6 kg)  10/01/20 170 lb (77.1 kg)     GEN:  Well nourished, well developed in no acute distress HEENT: Normal NECK: No JVD; No carotid bruits LYMPHATICS: No lymphadenopathy CARDIAC: RRR, no murmurs, rubs, gallops. Pacemaker pocket well healed. RESPIRATORY:  Clear to auscultation without rales, wheezing or rhonchi  ABDOMEN: Soft, non-tender, non-distended MUSCULOSKELETAL:  No edema; No deformity  SKIN: Warm and dry NEUROLOGIC:  Alert and oriented x 3 PSYCHIATRIC:  Normal affect   ASSESSMENT:    1. AF (paroxysmal atrial fibrillation) ([name])   2. Presence of cardiac pacemaker   3. Fall, sequela    PLAN:    In order of problems listed above:  1. AF (paroxysmal atrial fibrillation) ([name]) Patient with history of AF now off AC due to a remote traumatic subdural hematoma. He now is s/p AVJ ablation and PPM implant and is doing well with this.  Today we discussed stroke prophylaxis strategies related to his atrial fibrillation given his history of ICH. I think he would be a good candidate for a watchman device as part of a strategy to avoid long term exposure to anticoagulation. Today we discussed the procedure and its associated risks, recovery and efficacy. We discussed the need for short term anticoagulation in the periprocedural period.  I have seen Keyvon Herter in the office today who is being considered for a Watchman left atrial appendage closure device. I believe they will benefit from this procedure given their history of atrial fibrillation, CHA2DS2-VASc score of (age, cerebrovascular disease) and unadjusted ischemic stroke rate of 2.2% per year. Unfortunately, the patient is not felt to be a long term anticoagulation candidate secondary  to frequent falls and history of intracranial hemorrhage. The patient's chart has been reviewed and I feel that they would be a candidate for short term oral anticoagulation after Watchman implant.   It is my belief that after undergoing a LAA closure procedure, Ladamien Rammel will not need long term anticoagulation which eliminates anticoagulation side effects and major bleeding risk.   Procedural risks for the Watchman implant have been reviewed with the patient including a 0.5% risk of stroke, <1% risk of perforation and <1% risk of device embolization.    The published clinical data on the safety and effectiveness of WATCHMAN include but are not limited to the following: - Holmes DR, Jack Neal, Sick P et al. for the PROTECT AF Investigators. Percutaneous closure of the  left atrial appendage versus warfarin therapy for prevention of stroke in patients with atrial fibrillation: a randomised non-inferiority trial. Lancet 2009; 374: 534-42. Everlene Farrier, Doshi SK, Isa Rankin D et al. on behalf of the PROTECT AF Investigators. Percutaneous Left Atrial Appendage Closure for Stroke Prophylaxis in Patients With Atrial Fibrillation 2.3-Year Follow-up of the PROTECT AF (Watchman Left Atrial Appendage System for Embolic Protection in Patients With Atrial Fibrillation) Trial. Circulation 2013; 127:720-729. - Alli O, Doshi S,  Kar S, Reddy VY, Sievert H et al. Quality of Life Assessment in the Randomized PROTECT AF (Percutaneous Closure of the Left Atrial Appendage Versus Warfarin Therapy for Prevention of Stroke in Patients With Atrial Fibrillation) Trial of Patients at Risk for Stroke With Nonvalvular Atrial Fibrillation. J Am Coll Cardiol 2013; 61:1790-8. Aline August DR, Mia [place], Price M, Whisenant B, Sievert H, Doshi S, Huber K, Reddy V. Prospective randomized evaluation of the Watchman left atrial appendage Device in patients with atrial fibrillation versus long-term warfarin therapy; the PREVAIL trial. Journal  of the [employer] of Cardiology, Vol. 4, No. 1, 2014, 1-11. - Kar S, Doshi SK, Sadhu A, Horton R, Osorio J et al. Primary outcome evaluation of a next-generation left atrial appendage closure device: results from the PINNACLE FLX trial. Circulation 2021;143(18)1754-1762.    After today's visit with the patient which was dedicated solely for shared decision making visit regarding LAA closure device, the patient decided to proceed with the LAA appendage closure procedure scheduled to be done in the near future at [facility]. Prior to the procedure, I would like to obtain a gated CT scan of the chest with contrast timed for PV/LA visualization.    2. Presence of cardiac pacemaker Appears to be functioning well on today's ECG.  3. Fall, sequela Increases the ICH risk while on long term anticoagulation. Watchman as above.  4. S ignificant TR Mechanism fo the TR is unclear but could be in part related to impingement of the TV leaflet by the pacemaker lead. NYHA II-III symptoms.    Total time spent with patient today 65 minutes. This includes reviewing records, evaluating the patient and coordinating care.  Medication Adjustments/Labs and Tests Ordered: Current medicines are reviewed at length with the patient today.  Concerns regarding medicines are outlined above.  Orders Placed This Encounter  Procedures   CT CARDIAC MORPH/PULM VEIN W/CM&W/O CA SCORE   Basic Metabolic Panel (BMET)   EKG 12-Lead   No orders of the defined types were placed in this encounter.    Signed, Rossie Muskrat. Lalla Brothers, MD, [facility], [facility] 10/29/2020 10:17 PM    Electrophysiology [facility]

## 2020-10-29 NOTE — Patient Instructions (Signed)
Medication Instructions:  Your physician recommends that you continue on your current medications as directed. Please refer to the Current Medication list given to you today. *If you need a refill on your cardiac medications before your next appointment, please call your pharmacy*   Lab Work: You will get lab work today:  BMP   If you have labs (blood work) drawn today and your tests are completely normal, you will receive your results only by: MyChart Message (if you have MyChart) OR A paper copy in the mail If you have any lab test that is abnormal or we need to change your treatment, we will call you to review the results.   Testing/Procedures: Your physician has requested that you have cardiac CT. Cardiac computed tomography (CT) is a painless test that uses an x-ray machine to take clear, detailed pictures of your heart.   You will get a call with date/time for this test when it is approved by your insurance.   Follow-Up: At [facility], you and your health needs are our priority.  As part of our continuing mission to provide you with exceptional heart care, we have created designated Provider Care Teams.  These Care Teams include your primary Cardiologist (physician) and Advanced Practice Providers (APPs -  Physician Assistants and Nurse Practitioners) who all work together to provide you with the care you need, when you need it.   Your next appointment:     Your next appointment will be at our office after your current tests have been evaluated.     Nurse navigator:  Karsten Fells [phone]       Left Atrial Appendage Closure Device Implantation    Left atrial appendage (LAA) closure device implantation is a procedure to put a small device in the LAA of the heart. The LAA is a small sac in the wall of the heart's left upper chamber. Blood clots can form in the LAA in people with atrial fibrillation (AFib). The device closes the LAA to help prevent a blood clot and stroke.

## 2020-10-30 LAB — BASIC METABOLIC PANEL
BUN/Creatinine Ratio: 21 (ref 10–24)
BUN: 16 mg/dL (ref 8–27)
CO2: 31 mmol/L — ABNORMAL HIGH (ref 20–29)
Calcium: 9.2 mg/dL (ref 8.6–10.2)
Chloride: 102 mmol/L (ref 96–106)
Creatinine, Ser: 0.78 mg/dL (ref 0.76–1.27)
Glucose: 83 mg/dL (ref 65–99)
Potassium: 5.4 mmol/L — ABNORMAL HIGH (ref 3.5–5.2)
Sodium: 142 mmol/L (ref 134–144)
eGFR: 94 mL/min/{1.73_m2} (ref >59)

## 2020-11-09 ENCOUNTER — Other Ambulatory Visit: Payer: Self-pay

## 2020-11-09 ENCOUNTER — Emergency Department (HOSPITAL_COMMUNITY): Payer: No Typology Code available for payment source

## 2020-11-09 ENCOUNTER — Encounter (HOSPITAL_COMMUNITY): Payer: Self-pay | Admitting: Emergency Medicine

## 2020-11-09 ENCOUNTER — Inpatient Hospital Stay (HOSPITAL_COMMUNITY)
Admission: EM | Admit: 2020-11-09 | Discharge: 2020-11-12 | DRG: 092 | Disposition: A | Discharge status: Home / Self Care | Payer: No Typology Code available for payment source | Attending: Internal Medicine | Admitting: Internal Medicine

## 2020-11-09 DIAGNOSIS — F329 Major depressive disorder, single episode, unspecified: Secondary | ICD-10-CM | POA: Diagnosis not present

## 2020-11-09 DIAGNOSIS — R251 Tremor, unspecified: Secondary | ICD-10-CM | POA: Diagnosis not present

## 2020-11-09 DIAGNOSIS — R4781 Slurred speech: Secondary | ICD-10-CM | POA: Diagnosis present

## 2020-11-09 DIAGNOSIS — K900 Celiac disease: Secondary | ICD-10-CM | POA: Diagnosis present

## 2020-11-09 DIAGNOSIS — R4182 Altered mental status, unspecified: Secondary | ICD-10-CM

## 2020-11-09 DIAGNOSIS — G629 Polyneuropathy, unspecified: Secondary | ICD-10-CM | POA: Diagnosis present

## 2020-11-09 DIAGNOSIS — Z950 Presence of cardiac pacemaker: Secondary | ICD-10-CM

## 2020-11-09 DIAGNOSIS — E611 Iron deficiency: Secondary | ICD-10-CM | POA: Diagnosis present

## 2020-11-09 DIAGNOSIS — Z888 Allergy status to other drugs, medicaments and biological substances status: Secondary | ICD-10-CM

## 2020-11-09 DIAGNOSIS — D649 Anemia, unspecified: Secondary | ICD-10-CM | POA: Diagnosis present

## 2020-11-09 DIAGNOSIS — R4689 Other symptoms and signs involving appearance and behavior: Secondary | ICD-10-CM

## 2020-11-09 DIAGNOSIS — Y929 Unspecified place or not applicable: Secondary | ICD-10-CM

## 2020-11-09 DIAGNOSIS — G47411 Narcolepsy with cataplexy: Secondary | ICD-10-CM | POA: Diagnosis present

## 2020-11-09 DIAGNOSIS — F4310 Post-traumatic stress disorder, unspecified: Secondary | ICD-10-CM | POA: Diagnosis present

## 2020-11-09 DIAGNOSIS — G251 Drug-induced tremor: Principal | ICD-10-CM | POA: Diagnosis present

## 2020-11-09 DIAGNOSIS — Z79899 Other long term (current) drug therapy: Secondary | ICD-10-CM

## 2020-11-09 DIAGNOSIS — R443 Hallucinations, unspecified: Secondary | ICD-10-CM

## 2020-11-09 DIAGNOSIS — R0902 Hypoxemia: Secondary | ICD-10-CM | POA: Diagnosis not present

## 2020-11-09 DIAGNOSIS — Z9884 Bariatric surgery status: Secondary | ICD-10-CM

## 2020-11-09 DIAGNOSIS — G4733 Obstructive sleep apnea (adult) (pediatric): Secondary | ICD-10-CM | POA: Diagnosis present

## 2020-11-09 DIAGNOSIS — Z87891 Personal history of nicotine dependence: Secondary | ICD-10-CM

## 2020-11-09 DIAGNOSIS — R000 Tachycardia, unspecified: Secondary | ICD-10-CM | POA: Diagnosis not present

## 2020-11-09 DIAGNOSIS — I480 Paroxysmal atrial fibrillation: Secondary | ICD-10-CM | POA: Diagnosis not present

## 2020-11-09 DIAGNOSIS — G4700 Insomnia, unspecified: Secondary | ICD-10-CM | POA: Diagnosis present

## 2020-11-09 DIAGNOSIS — T426X5A Adverse effect of other antiepileptic and sedative-hypnotic drugs, initial encounter: Secondary | ICD-10-CM | POA: Diagnosis present

## 2020-11-09 DIAGNOSIS — G894 Chronic pain syndrome: Secondary | ICD-10-CM | POA: Diagnosis present

## 2020-11-09 DIAGNOSIS — R441 Visual hallucinations: Secondary | ICD-10-CM | POA: Diagnosis present

## 2020-11-09 DIAGNOSIS — Z66 Do not resuscitate: Secondary | ICD-10-CM | POA: Diagnosis present

## 2020-11-09 DIAGNOSIS — Z8249 Family history of ischemic heart disease and other diseases of the circulatory system: Secondary | ICD-10-CM

## 2020-11-09 DIAGNOSIS — R471 Dysarthria and anarthria: Secondary | ICD-10-CM | POA: Diagnosis present

## 2020-11-09 DIAGNOSIS — R519 Headache, unspecified: Secondary | ICD-10-CM | POA: Diagnosis present

## 2020-11-09 DIAGNOSIS — K219 Gastro-esophageal reflux disease without esophagitis: Secondary | ICD-10-CM | POA: Diagnosis present

## 2020-11-09 DIAGNOSIS — Z880 Allergy status to penicillin: Secondary | ICD-10-CM

## 2020-11-09 DIAGNOSIS — Z20822 Contact with and (suspected) exposure to covid-19: Secondary | ICD-10-CM | POA: Diagnosis present

## 2020-11-09 DIAGNOSIS — R457 State of emotional shock and stress, unspecified: Secondary | ICD-10-CM | POA: Diagnosis not present

## 2020-11-09 DIAGNOSIS — D72819 Decreased white blood cell count, unspecified: Secondary | ICD-10-CM | POA: Diagnosis present

## 2020-11-09 DIAGNOSIS — I482 Chronic atrial fibrillation, unspecified: Secondary | ICD-10-CM | POA: Diagnosis present

## 2020-11-09 DIAGNOSIS — Z9989 Dependence on other enabling machines and devices: Secondary | ICD-10-CM

## 2020-11-09 DIAGNOSIS — Z79891 Long term (current) use of opiate analgesic: Secondary | ICD-10-CM

## 2020-11-09 LAB — CBC WITH DIFFERENTIAL/PLATELET
Abs Immature Granulocytes: 0.01 10*3/uL (ref 0.00–0.07)
Basophils Absolute: 0 10*3/uL (ref 0.0–0.1)
Basophils Relative: 0 %
Eosinophils Absolute: 0.2 10*3/uL (ref 0.0–0.5)
Eosinophils Relative: 5 %
HCT: 38.3 % — ABNORMAL LOW (ref 39.0–52.0)
Hemoglobin: 12.5 g/dL — ABNORMAL LOW (ref 13.0–17.0)
Immature Granulocytes: 0 %
Lymphocytes Relative: 25 %
Lymphs Abs: 1.1 10*3/uL (ref 0.7–4.0)
MCH: 32.5 pg (ref 26.0–34.0)
MCHC: 32.6 g/dL (ref 30.0–36.0)
MCV: 99.5 fL (ref 80.0–100.0)
Monocytes Absolute: 0.6 10*3/uL (ref 0.1–1.0)
Monocytes Relative: 13 %
Neutro Abs: 2.5 10*3/uL (ref 1.7–7.7)
Neutrophils Relative %: 57 %
Platelets: 174 10*3/uL (ref 150–400)
RBC: 3.85 MIL/uL — ABNORMAL LOW (ref 4.22–5.81)
RDW: 13.4 % (ref 11.5–15.5)
WBC: 4.5 10*3/uL (ref 4.0–10.5)
nRBC: 0 % (ref 0.0–0.2)

## 2020-11-09 LAB — BASIC METABOLIC PANEL
Anion gap: 9 (ref 5–15)
BUN: 16 mg/dL (ref 8–23)
CO2: 30 mmol/L (ref 22–32)
Calcium: 9 mg/dL (ref 8.9–10.3)
Chloride: 99 mmol/L (ref 98–111)
Creatinine, Ser: 0.78 mg/dL (ref 0.61–1.24)
GFR, Estimated: >60 mL/min (ref >60)
Glucose, Bld: 94 mg/dL (ref 70–99)
Potassium: 4.4 mmol/L (ref 3.5–5.1)
Sodium: 138 mmol/L (ref 135–145)

## 2020-11-09 LAB — URINALYSIS, ROUTINE W REFLEX MICROSCOPIC
Bilirubin Urine: NEGATIVE
Glucose, UA: NEGATIVE mg/dL
Hgb urine dipstick: NEGATIVE
Ketones, ur: NEGATIVE mg/dL
Leukocytes,Ua: NEGATIVE
Nitrite: NEGATIVE
Protein, ur: NEGATIVE mg/dL
Specific Gravity, Urine: 1.004 — ABNORMAL LOW (ref 1.005–1.030)
pH: 7 (ref 5.0–8.0)

## 2020-11-09 LAB — ETHANOL: Alcohol, Ethyl (B): <10 mg/dL (ref <10)

## 2020-11-09 LAB — AMMONIA: Ammonia: 17 umol/L (ref 9–35)

## 2020-11-09 LAB — MAGNESIUM: Magnesium: 1.9 mg/dL (ref 1.7–2.4)

## 2020-11-09 LAB — TROPONIN I (HIGH SENSITIVITY)
Troponin I (High Sensitivity): 10 ng/L (ref <18)
Troponin I (High Sensitivity): 11 ng/L (ref <18)

## 2020-11-09 LAB — RESP PANEL BY RT-PCR (FLU A&B, COVID) ARPGX2
Influenza A by PCR: NEGATIVE
Influenza B by PCR: NEGATIVE
SARS Coronavirus 2 by RT PCR: NEGATIVE

## 2020-11-09 MED ORDER — HYDROXYZINE HCL 25 MG PO TABS
25.0000 mg | ORAL_TABLET | Freq: Once | ORAL | Status: AC
  Administered 2020-11-09: 25 mg via ORAL
  Filled 2020-11-09: qty 1

## 2020-11-09 MED ORDER — HYDROXYZINE HCL 10 MG PO TABS
10.0000 mg | ORAL_TABLET | Freq: Four times a day (QID) | ORAL | Status: DC | PRN
  Filled 2020-11-09: qty 1

## 2020-11-09 MED ORDER — HYDROXYZINE HCL 25 MG PO TABS
25.0000 mg | ORAL_TABLET | Freq: Four times a day (QID) | ORAL | Status: DC | PRN
  Administered 2020-11-10 – 2020-11-11 (×2): 25 mg via ORAL
  Filled 2020-11-09 (×2): qty 1

## 2020-11-09 MED ORDER — POLYETHYLENE GLYCOL 3350 17 G PO PACK: 17.0000 g | PACK | Freq: Every day | ORAL | Status: DC | PRN

## 2020-11-09 MED ORDER — DIPHENHYDRAMINE HCL 50 MG/ML IJ SOLN
12.5000 mg | Freq: Once | INTRAMUSCULAR | Status: AC
  Administered 2020-11-09: 12.5 mg via INTRAVENOUS
  Filled 2020-11-09: qty 1

## 2020-11-09 MED ORDER — GABAPENTIN 300 MG PO CAPS
300.0000 mg | ORAL_CAPSULE | Freq: Three times a day (TID) | ORAL | Status: DC
  Administered 2020-11-09 – 2020-11-11 (×5): 300 mg via ORAL
  Filled 2020-11-09 (×6): qty 1

## 2020-11-09 MED ORDER — ACETAMINOPHEN 325 MG PO TABS
650.0000 mg | ORAL_TABLET | Freq: Four times a day (QID) | ORAL | Status: DC | PRN
  Administered 2020-11-10 – 2020-11-11 (×3): 650 mg via ORAL
  Filled 2020-11-09 (×3): qty 2

## 2020-11-09 MED ORDER — BUPRENORPHINE HCL-NALOXONE HCL 2-0.5 MG SL SUBL
1.0000 | SUBLINGUAL_TABLET | Freq: Every day | SUBLINGUAL | Status: DC
Start: 2020-11-10 — End: 2020-11-13
  Administered 2020-11-10 – 2020-11-12 (×3): 1 via SUBLINGUAL
  Filled 2020-11-09 (×3): qty 1

## 2020-11-09 MED ORDER — SODIUM CHLORIDE 0.9% FLUSH
3.0000 mL | Freq: Two times a day (BID) | INTRAVENOUS | Status: DC
  Administered 2020-11-10 – 2020-11-12 (×3): 3 mL via INTRAVENOUS

## 2020-11-09 MED ORDER — ACETAMINOPHEN 650 MG RE SUPP: 650.0000 mg | Freq: Four times a day (QID) | RECTAL | Status: DC | PRN

## 2020-11-09 MED ORDER — ENOXAPARIN SODIUM 40 MG/0.4ML IJ SOSY
40.0000 mg | PREFILLED_SYRINGE | INTRAMUSCULAR | Status: DC
  Administered 2020-11-09 – 2020-11-11 (×3): 40 mg via SUBCUTANEOUS
  Filled 2020-11-09 (×3): qty 0.4

## 2020-11-09 NOTE — ED Notes (Signed)
Patient transported to CT 

## 2020-11-09 NOTE — ED Notes (Signed)
Pt took lasix 40mg  this am for edema-- takes it when he has swelling- per dr's orders

## 2020-11-09 NOTE — ED Triage Notes (Signed)
To ED via [facility] from Home - with c/o tremors that woke pt up in the middle of the night-- EMS was called to house for "seizures" -- pt had tremors in hands-- arms. Was seen here recently for same and started Kepra.  A/O x 4, w/d --  Received Versed 1 mg IM by EMS-- tremors stopped after medication admin.

## 2020-11-09 NOTE — H&P (Addendum)
History and Physical   Jack AuerJerry Gaertner ZOX:[id number] DOB: 10/09/1945 DOA: 11/09/2020  [facility]   Patient coming from: Home  Chief Complaint: Tremors  HPI: Jack Neal is a 75 y.o. male with medical history significant of A. fib lesion, depression, PTSD, celiac disease, chronic pain, generalized weakness, gastric bypass, OSA, neuropathy, GERD, status post pacemaker presenting with increasing severity of tremors at home.  Patient admitted in July with similar symptoms evaluated for possible seizure with a negative EEG and MRI.  Thought that these episodes were like to be seizures and decided on not starting antiepileptics at that time.  However he [facility] and apparently started on Keppra.  He has been having several episodes a day lasting seconds to minutes most days since then.  He had a more significant episode today lasting longer and with increased intensity.  Shaking of bilateral arms and legs but consciousness throughout.  1 such episode was witnessed in the ED patient remained conscious and talking throughout the episode.  While in the ED patient also was noted to have new onset hallucination type episodes where his wife reported he was talking to people who were not there. She also adds that he has had some episodes where he seemed to have some slurred speech or nonsensical speech.  These are not necessarily associated with episodes of tremor.  He had one about a week ago and 1 earlier today.  Wife has significant concern about potentially going home with him with these new changes.  Patient has fevers, chills, chest pain or shortness of breath, abdominal pain, constipation, diarrhea, nausea, vomiting.  ED Course: Vital signs in the ED are stable.  Lab work-up showed normal BMP, magnesium, troponin, urinalysis, ethanol level, chest x-ray.  CBC with hemoglobin stable at 12.5.  After speaking with the EDP they agreed to discuss with the  patient consulting psychiatry for evaluation while here.  Review of Systems: As per HPI otherwise all other systems reviewed and are negative.  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   Hypotension    Major depressive disorder 10/01/2020   Neuropathy    OSA on CPAP 10/01/2020   Pacemaker    Peripheral polyneuropathy 10/01/2020   Presence of cardiac pacemaker 10/01/2020   PTSD (post-traumatic stress disorder) 10/01/2020    History reviewed. No pertinent surgical history.  Social History  reports that he has never smoked. He has never used smokeless tobacco. He reports that he does not drink alcohol and does not use drugs.  Allergies  Allergen Reactions   Amphetamine Rash and Other (See Comments)    Altered mental status- amnesia (also)   Diazepam Rash    Other reaction(s): Amnesia, Mental Status Changes (intolerance), Other amnesia    Zoloft [Sertraline] Anxiety, Rash and Other (See Comments)    Panic attacks and Cardiovascular Arrest (ALSO)    Gluten Meal Diarrhea   Penicillin G Rash    Family History  Problem Relation Age of Onset   Heart disease Neg Hx   Reviewed on admission  Prior to Admission medications   Medication Sig Start Date End Date Taking? Authorizing Provider  buprenorphine-naloxone (SUBOXONE) 2-0.5 mg SUBL SL tablet Place 1 tablet under the tongue daily. 01/11/20  [provider]  doxepin (SINEQUAN) 10 MG capsule Take 1 capsule by mouth. 1 hour before bedtime 09/05/20   [provider]  DULoxetine (CYMBALTA) 30 MG capsule Take 1 capsule by mouth in the morning, at noon, and at bedtime. 02/15/20   [provider]  gabapentin (NEURONTIN) 300 MG capsule Take 1 capsule by mouth 3 (three) times daily.  09/03/20   [provider]  hydrOXYzine (ATARAX/VISTARIL) 10 MG tablet Take 2 tablets by mouth at bedtime. 02/15/20   [provider]  levETIRAcetam 250 MG TB3D  10/17/20   [provider]  lidocaine (LIDODERM) 5 % Place 1 patch onto the skin. Apply 1 patch for 12 hours and then remove for 12 hours 07/03/20   [provider]  loperamide (IMODIUM) 1 MG/5ML solution Take 5 mLs by mouth daily as needed for diarrhea or loose stools.    [provider]  midodrine (PROAMATINE) 2.5 MG tablet Take 1 tablet by mouth 3 (three) times daily. 04/15/20   [provider]    Physical Exam: Vitals:   11/09/20 1700 11/09/20 1730 11/09/20 1800 11/09/20 1815  BP: (!) 102/57 103/60 100/61 101/70  Pulse: 80 80 80 80  Resp: 13 17 (!) 24 10  Temp:      TempSrc:      SpO2: 98% 98% 98% 98%  Weight:      Height:       Physical Exam Constitutional:      General: He is not in acute distress.    Appearance: Normal appearance.     Comments: Somewhat tired appearing.  HENT:     Head: Normocephalic and atraumatic.     Mouth/Throat:     Mouth: Mucous membranes are moist.     Pharynx: Oropharynx is clear.  Eyes:     Extraocular Movements: Extraocular movements intact.     Pupils: Pupils are equal, round, and reactive to light.  Cardiovascular:     Rate and Rhythm: Normal rate and regular rhythm.     Pulses: Normal pulses.     Heart sounds: Normal heart sounds.  Pulmonary:     Effort: Pulmonary effort is normal. No respiratory distress.     Breath sounds: Normal breath sounds.  Abdominal:     General: Bowel sounds are normal. There is no distension.     Palpations: Abdomen is soft.     Tenderness: no abdominal tenderness  Musculoskeletal:        General: No swelling or deformity.  Skin:    General: Skin is warm and dry.  Neurological:     Comments: Mental Status: Patient is awake, alert, oriented x3 No signs of aphasia or neglect Cranial Nerves: II:  Pupils equal, round, and reactive to light.   III,IV, VI: EOMI without ptosis or diploplia.  V: Facial sensation is symmetric to light touch. VII: Facial movement is symmetric.  VIII: hearing is intact to voice X: Uvula elevates symmetrically XI: Shoulder shrug is symmetric. XII: tongue is midline without atrophy or fasciculations.  Motor: Good effort thorughout, at Least 5/5 bilateral UE, 5/5 bilateral lower extremitiy  Sensory: Sensation is grossly intact bilateral UEs & LEs     Labs on Admission: I have personally reviewed following labs and imaging studies  CBC: Recent Labs  Lab 11/09/20 1230  WBC 4.5  NEUTROABS 2.5  HGB 12.5*  HCT 38.3*  MCV 99.5  PLT 174    Basic Metabolic Panel: Recent Labs  Lab 11/09/20 1230  NA 138  K 4.4  CL 99  CO2 30  GLUCOSE 94  BUN 16  CREATININE 0.78  CALCIUM 9.0  MG 1.9    GFR: Estimated Creatinine Clearance: 87.6 mL/min (by C-G formula based on SCr of 0.78 mg/dL).  Liver Function Tests: No results for input(s): AST, ALT, ALKPHOS, BILITOT, PROT, ALBUMIN in the last 168 hours.  Urine analysis:    Component Value Date/Time   COLORURINE YELLOW 11/09/2020 1547   APPEARANCEUR CLEAR 11/09/2020 1547   LABSPEC 1.004 (L) 11/09/2020 1547   PHURINE 7.0 11/09/2020 1547   GLUCOSEU NEGATIVE 11/09/2020 1547   HGBUR NEGATIVE 11/09/2020 1547   BILIRUBINUR NEGATIVE 11/09/2020 1547   KETONESUR NEGATIVE 11/09/2020 1547   PROTEINUR NEGATIVE 11/09/2020 1547   NITRITE NEGATIVE 11/09/2020 1547   LEUKOCYTESUR NEGATIVE 11/09/2020 1547    Radiological Exams on Admission: CT HEAD WO CONTRAST ( )  Result Date: 11/09/2020 CLINICAL DATA:  Altered mental status. EXAM: CT HEAD WITHOUT CONTRAST TECHNIQUE: Contiguous axial images were obtained from the base of the skull through the vertex without intravenous contrast. COMPARISON:  October 01, 2020 FINDINGS: Brain: There is mild cerebral atrophy with widening of the extra-axial spaces and ventricular  dilatation. There are areas of decreased attenuation within the white matter tracts of the supratentorial brain, consistent with microvascular disease changes. Vascular: No hyperdense vessel or unexpected calcification. Skull: Negative for an acute fracture. Stable areas of cortical thinning are seen within the posterior parietal regions, bilaterally. Sinuses/Orbits: No acute finding. Other: None. IMPRESSION: 1. Generalized cerebral atrophy. 2. No acute intracranial abnormality. Electronically Signed   By: Aram Candela M.D.   On: 11/09/2020 19:35   DG Chest Portable 1 View  Result Date: 11/09/2020 CLINICAL DATA:  infection evaluation EXAM: PORTABLE CHEST 1 VIEW COMPARISON:  None. FINDINGS: The cardiomediastinal silhouette is enlarged in contour.LEFT chest cardiac pacing device. No pleural effusion. No pneumothorax. Linear opacities along the RIGHT perihilar border, most consistent with scar/atelectasis. Linear opacities at the LEFT lung base, likely scar/atelectasis. Visualized abdomen is unremarkable. IMPRESSION: Bilateral atelectasis. Electronically Signed   By: Meda Klinefelter M.D.   On: 11/09/2020 15:16    EKG: Independently reviewed. Paced rhythm, ventricularly paced.  At 84 bpm.    Assessment/Plan Principal Problem:   AMS (altered mental status) Active Problems:   AF (paroxysmal atrial fibrillation) ([name])   Major depressive disorder   OSA on CPAP   Tremor  Altered mental status Tremors Hallucinations > Patient presenting with worsening tremors.  Unclear etiology.  Previous work-up negative for seizure and also included a normal brain MRI this was last month.  During his shaking/tremor episodes he is able to remain conscious and talking.  His body will tense up during the episodes he is able to answer questions and talk they are brief and at times do appear somewhat purposeful at times, but he does not appear to be seeking anything from them, presentation very unusual. > Patient had  been started on Keppra outpatient anyway, will check Keppra level to ensure this is not too high. > Has spoke with the EP and they are discussing with patient and family and plan on consulting psychiatry for evaluation. > Patient and family have significant concerns about going home with h new onset of hallucinations (talking to people who are not there) in the ED. > We will monitor for continued rule out of organic etiology.  Possibility of polypharmacy also remains. > Wife also reports some possible slurred speech episodes but none noted in the ED.  This was  earlier in the week so EDP has ordered CT head. > Chart review and brief GERD scientist neuro by EDP did not elicit any obvious neurologic etiology considering recent negative work-up.  We will see what repeat CT and EEG show. - Follow-up psychiatric recommendations - Could consider repeat neuro consult in the morning if not improving and depending on psych evaluation. - Check Keppra level, hold Keppra as well - Hold duloxetine, doxepin overnight pending psychiatric evaluation.  Will likely need to restart these in the morning as he will need to wean off of them. - Follow-up CT head - Repeat EEG - Avoid centrally acting medications.  We will make hydroxyzine available as needed as this has helped episodes in the past.  Atrial fibrillation Status post pacemaker > Currently with paced rhythm in the 80s in the ED. > EDP is looking into having device interrogated to make sure no arrhythmias picked up and that is functioning normally.   OSA on CPAP - Continue home CPAP   Chronic pain Neuropathy - Continue home Suboxone and gabapentin  Celiac Disease > Per uptodate "Several reports have described an association between celiac disease and neurologic or psychiatric symptoms" Can be due to Check B1 (thiamine), B2 (riboflavin), B3 (niacin), B6 (pyridoxine), but typically only in severe/extensive small bowl disease. > Elevated b12 last  admission - Gluten free diet  Depression PTSD > For holding multiple medications given his change in mentation and possibility of polypharmacy.  We will see if psychiatry has any opinion on which medications may need to be held first continue. - Holding home doxepin, duloxetine in the setting of altered status / Tremors / Spasm as above   DVT prophylaxis: Lovenox Code Status:   DNR   Family Communication:  Wife updated at bedside  Disposition Plan:   Patient is from:  Home  Anticipated DC to:  Home  Anticipated DC date:  1 to 2 days  Anticipated DC barriers: None  Consults called:  EDP is consulting psychiatry. Admission status:  Observation, telemetry  Severity of Illness: The appropriate patient status for this patient is OBSERVATION. Observation status is judged to be reasonable and necessary in order to provide the required intensity of service to ensure the patient's safety. The patient's presenting symptoms, physical exam findings, and initial radiographic and laboratory data in the context of their medical condition is felt to place them at decreased risk for further clinical deterioration. Furthermore, it is anticipated that the patient will be medically stable for discharge from the hospital within 2 midnights of admission. The following factors support the patient status of observation.   " The patient's presenting symptoms include tremors, hallucinations,?Slurred speech. " The physical exam findings include stable physical exam.  Some tremors witnessed by EDP and myself. " The initial radiographic and laboratory data are ab work-up showed normal BMP, magnesium, troponin, urinalysis, ethanol level, chest x-ray.  CBC with hemoglobin stable at 12.5.   Synetta Fail MD [facility]  How to contact the [facility] Attending or Consulting provider 7A - 7P or covering provider during after hours 7P -7A, for this patient?   Check the care team in [facility] and look for a)  attending/consulting [facility] provider listed and b) the [facility] team listed Log into [email or web address] and use [place]'s universal password to access. If you do not have the password, please contact the hospital operator. Locate the [facility] provider you are looking for under [facility] and page to a number that you can be directly  reached. If you still have difficulty reaching the provider, please page the [facility] (Director on Call) for the Hospitalists listed on amion for assistance.  11/09/2020, 7:52 PM

## 2020-11-09 NOTE — Progress Notes (Signed)
RT note. Patient placed on auto cpap 15/5 sat 95% w/ stable VS at this time. Patient resting comfortable, RT will continue to monitor

## 2020-11-09 NOTE — ED Provider Notes (Signed)
[facility] EMERGENCY DEPARTMENT Provider Note   CSN: [id number] Arrival date & time: 11/09/20  1242     History Chief Complaint  Patient presents with   Tremors    Jack Neal is a 75 y.o. male with history of paroxysmal A. fib, pacemaker, presenting to emergency department with tremors.  Patient was seen in the emergency department and admitted to the hospital in July of this year for episodes of tremors.  He underwent an MRI of the brain which was unremarkable and an EEG which did not show signs of epilepsy.  It was not clear what the cause of his tremors were.  He was discharged home.  He reports he went to see a neurologist the [facility] hospital and was started on Keppra 3 times a day 3 weeks ago, which he has been taking.  It is still not clear whether he is having seizures.  He presents today in the company of his wife with concern for worsening tremors.  He reports that he has his episodes on a near daily basis, usually lasting about 15 seconds, up to 4 times a day.  However this morning the episodes lasted much longer.  He said his first episode began around 10:20 AM.  He said it was very intense causing him to sit up on the chair.  He reports shaking of his bilateral arms and legs.  He was conscious throughout this.  He reports he has a headache during these episodes which then resolves.  There is no loss of consciousness.  There is no direct head injury.  He had had multiple of his episodes.  He was given Versed by EMS en route to the hospital.  He denies recent fevers, chills, cough, congestion, dysuria, or other infectious symptoms.  He reports he has had the vaccines for COVID.  HPI     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    Hypotension    Major depressive disorder 10/01/2020   Neuropathy    OSA on CPAP 10/01/2020   Pacemaker    Peripheral polyneuropathy 10/01/2020   Presence of cardiac pacemaker 10/01/2020   PTSD (post-traumatic stress disorder) 10/01/2020    Patient Active Problem List   Diagnosis Date Noted   Fall 10/05/2020   Tremor    Abnormal head CT    Generalized weakness 10/01/2020   Unsteady gait when walking 10/01/2020   AF (paroxysmal atrial fibrillation) ([name]) 10/01/2020   Presence of cardiac pacemaker 10/01/2020   Peripheral polyneuropathy 10/01/2020   Major depressive disorder 10/01/2020   OSA on CPAP 10/01/2020   Chronic pain syndrome 10/01/2020   Celiac disease 10/01/2020   History of gastric bypass 10/01/2020    History reviewed. No pertinent surgical history.     Family History  Problem Relation Age of Onset   Heart disease Neg Hx     Social History   Tobacco Use   Smoking status: Never   Smokeless tobacco: Never  Substance Use Topics   Alcohol use: Never   Drug use: Never    Home Medications Prior to Admission medications   Medication Sig Start Date End Date Taking? Authorizing Provider  buprenorphine-naloxone (SUBOXONE) 2-0.5 mg SUBL SL tablet Place 1 tablet under the tongue daily. 01/11/20  [provider]  doxepin (SINEQUAN) 10 MG capsule Take 1 capsule by mouth. 1 hour before bedtime 09/05/20   [provider]  DULoxetine (CYMBALTA) 30 MG capsule Take 1 capsule by mouth in the morning, at noon, and at bedtime. 02/15/20   [provider]  gabapentin (NEURONTIN) 300 MG capsule Take 1 capsule by mouth 3 (three) times daily. 09/03/20   [provider]  hydrOXYzine (ATARAX/VISTARIL) 10 MG tablet Take 2 tablets by mouth at bedtime. 02/15/20   [provider]  levETIRAcetam 250 MG TB3D  10/17/20   [provider]  lidocaine (LIDODERM) 5 % Place 1 patch onto the skin. Apply 1 patch for 12 hours and then remove for 12  hours 07/03/20   [provider]  loperamide (IMODIUM) 1 MG/5ML solution Take 5 mLs by mouth daily as needed for diarrhea or loose stools.    [provider]  midodrine (PROAMATINE) 2.5 MG tablet Take 1 tablet by mouth 3 (three) times daily. 04/15/20   [provider]    Allergies    Amphetamine, Diazepam, Zoloft [sertraline], Gluten meal, and Penicillin g  Review of Systems   Review of Systems  Constitutional:  Negative for chills and fever.  HENT:  Negative for ear pain and sore throat.   Eyes:  Negative for pain and visual disturbance.  Respiratory:  Negative for cough and shortness of breath.   Cardiovascular:  Negative for chest pain and palpitations.  Gastrointestinal:  Negative for abdominal pain and vomiting.  Genitourinary:  Negative for dysuria and hematuria.  Musculoskeletal:  Negative for arthralgias and back pain.  Skin:  Negative for color change and rash.  Neurological:  Positive for tremors and headaches. Negative for dizziness, seizures, facial asymmetry and light-headedness.  All other systems reviewed and are negative.  Physical Exam Updated Vital Signs BP 114/75   Pulse 83   Temp 97.6 F (36.4 C) (Oral)   Resp 12   Ht 5\' 11"  (1.803 m)   Wt 79.8 kg   SpO2 98%   BMI 24.55 kg/m   Physical Exam Constitutional:      General: He is not in acute distress.    Comments: Appears tired  HENT:     Head: Normocephalic and atraumatic.  Eyes:     Conjunctiva/sclera: Conjunctivae normal.     Pupils: Pupils are equal, round, and reactive to light.  Cardiovascular:     Rate and Rhythm: Normal rate. Rhythm irregular.     Pulses: Normal pulses.  Pulmonary:     Effort: Pulmonary effort is normal. No respiratory distress.  Abdominal:     General: There is no distension.     Tenderness: There is no abdominal tenderness.  Skin:    General: Skin is warm and dry.  Neurological:     General: No focal deficit present.     Mental Status: He is  alert and oriented to person, place, and time. Mental status is at baseline.     Sensory: No sensory deficit.     Motor: No weakness.     Gait: Gait normal.  Psychiatric:        Mood and Affect: Mood normal.        Behavior: Behavior normal.    ED Results / Procedures / Treatments   Labs (all labs ordered are listed, but only abnormal results are displayed) Labs Reviewed - No data to display  EKG EKG Interpretation  Date/Time:  Saturday November 09 2020 12:49:08 EDT Ventricular Rate:  84 PR Interval:  175 QRS Duration: 142 QT Interval:  410 QTC Calculation: 485 R Axis:   -78 Text Interpretation: Sinus rhythm Nonspecific IVCD with LAD Left ventricular hypertrophy No sig change from prior ecg Jul 2022 Confirmed by Alvester Chou [id number] on 11/09/2020 2:18:17 PM  Radiology No results found.  Procedures Procedures   Medications Ordered in ED Medications - No data to display  ED Course  I have reviewed the triage vital signs and the nursing notes.  Pertinent labs & imaging results that were available during my care of the patient were reviewed by me and considered in my medical decision making (see chart for details).  Differential diagnosis includes infection versus clonus versus subclinical seizure versus other.  The patient did have 1 of these episodes during my exam, lasting about 15 seconds, where he had persistent tremors of the entire upper and lower extremity, left side more than the right.  He remained conscious and speaking to me throughout this.  His symptoms abruptly abated, there was no confusion following this.  This seems a lot less consistent with a seizure.  We will check a UA, chest x-ray, COVID swab, and white blood cell count in terms of an infection evaluation.  Also check his electrolyte levels.  I personally reviewed his prior medical work-up.  Supplemental history was provided by his wife at bedside.  Does not appear septic on presentation.  No  localizing neurological symptoms to suggest stroke  Clinical Course as of 11/10/20 0941  Sat Nov 09, 2020  1809 Patient now reporting he had hallucinations.  He reported he was talking to people who are not in the room.  He says this is unusual.  He has never had this before. [MT]  1901 Patient and his wife are updated.  We have agreed on an observation admission tonight and consideration of a [facility] psych consult given his new hallucinations.  I have signed out to the hospitalist. [MT]    Clinical Course User Index [MT] Rakeisha Nyce, Kermit Balo, MD    Final Clinical Impression(s) / ED Diagnoses Final diagnoses:  None    Rx / DC Orders ED Discharge Orders     None        Terald Sleeper, MD 11/10/20 [phone]

## 2020-11-10 ENCOUNTER — Encounter (HOSPITAL_COMMUNITY): Payer: Self-pay | Admitting: Internal Medicine

## 2020-11-10 ENCOUNTER — Observation Stay (HOSPITAL_COMMUNITY): Payer: No Typology Code available for payment source

## 2020-11-10 DIAGNOSIS — R443 Hallucinations, unspecified: Secondary | ICD-10-CM | POA: Diagnosis not present

## 2020-11-10 DIAGNOSIS — I480 Paroxysmal atrial fibrillation: Secondary | ICD-10-CM | POA: Diagnosis not present

## 2020-11-10 DIAGNOSIS — R4182 Altered mental status, unspecified: Secondary | ICD-10-CM | POA: Diagnosis not present

## 2020-11-10 DIAGNOSIS — R251 Tremor, unspecified: Secondary | ICD-10-CM | POA: Diagnosis not present

## 2020-11-10 LAB — CBC
HCT: 35.9 % — ABNORMAL LOW (ref 39.0–52.0)
Hemoglobin: 11.8 g/dL — ABNORMAL LOW (ref 13.0–17.0)
MCH: 32.2 pg (ref 26.0–34.0)
MCHC: 32.9 g/dL (ref 30.0–36.0)
MCV: 98.1 fL (ref 80.0–100.0)
Platelets: 152 10*3/uL (ref 150–400)
RBC: 3.66 MIL/uL — ABNORMAL LOW (ref 4.22–5.81)
RDW: 13.3 % (ref 11.5–15.5)
WBC: 3.5 10*3/uL — ABNORMAL LOW (ref 4.0–10.5)
nRBC: 0 % (ref 0.0–0.2)

## 2020-11-10 LAB — COMPREHENSIVE METABOLIC PANEL
ALT: 20 U/L (ref 0–44)
AST: 33 U/L (ref 15–41)
Albumin: 3.3 g/dL — ABNORMAL LOW (ref 3.5–5.0)
Alkaline Phosphatase: 126 U/L (ref 38–126)
Anion gap: 8 (ref 5–15)
BUN: 19 mg/dL (ref 8–23)
CO2: 31 mmol/L (ref 22–32)
Calcium: 8.5 mg/dL — ABNORMAL LOW (ref 8.9–10.3)
Chloride: 99 mmol/L (ref 98–111)
Creatinine, Ser: 0.86 mg/dL (ref 0.61–1.24)
GFR, Estimated: >60 mL/min (ref >60)
Glucose, Bld: 96 mg/dL (ref 70–99)
Potassium: 3.8 mmol/L (ref 3.5–5.1)
Sodium: 138 mmol/L (ref 135–145)
Total Bilirubin: 1.2 mg/dL (ref 0.3–1.2)
Total Protein: 6.1 g/dL — ABNORMAL LOW (ref 6.5–8.1)

## 2020-11-10 MED ORDER — VITAMIN B-12 1000 MCG PO TABS
1000.0000 ug | ORAL_TABLET | Freq: Every day | ORAL | Status: DC
  Administered 2020-11-10 – 2020-11-12 (×3): 1000 ug via ORAL
  Filled 2020-11-10 (×3): qty 1

## 2020-11-10 MED ORDER — DICLOFENAC SODIUM 1 % EX GEL
2.0000 g | Freq: Every day | CUTANEOUS | Status: DC
  Filled 2020-11-10: qty 100

## 2020-11-10 NOTE — Progress Notes (Signed)
Jack Neal  QMV:[id number] DOB: [birth year] DOA: 11/09/2020 PCP: [facility]    Brief Narrative:  75 year old with a history of atrial fibrillation, depression, PTSD, celiac disease, chronic pain, status post gastric bypass, OSA, GERD, and pacemaker placement who presented to the ER with increasing "tremors" at home.  The patient was admitted in July 2020 with similar symptoms and evaluated for possible seizure with a negative EEG and unremarkable MRI.  Though he was not started on medication at the time Keppra was initiated through the [place] in a subsequent outpatient follow-up.  The patient reports ongoing episodes of tremors several times a day every day since that time.  They are described as bilateral shaking of the arms and legs without alteration in consciousness lasting minutes at most.  One of his episodes happened while he was in the ED and the patient remained conscious and was talking during the episode.  His wife also reports intermittent hallucinations with the patient talking to people who are not there and at times slurring his speech or using nonsensical speech.  Significant Events:  8/27 admit via ED 8/28 EEG -no seizure activity noted  Consultants:  Psychiatry  Code Status: NO CODE BLUE  Antimicrobials:  None  DVT prophylaxis: Lovenox  Subjective: Afebrile.  Vital signs stable.  Reports ongoing episodes even while in the ER.  Otherwise has no new complaints.  Denies chest pain shortness of breath fevers or chills.  Is alert and oriented x4.  Assessment & Plan:  Idiopathic tremors - intermittent hallucinations Etiology unclear -description not suggestive of seizure -recent normal brain MRI -CT head this admission reveals only generalized brain atrophy -evaluate for possible metabolic etiology -psychiatry to eval for possible psychogenic etiology  Chronic atrial fibrillation Rate controlled -does not appear to be on long-term anticoagulation  Normocytic  anemia Monitor hemoglobin trend -no evidence of gross blood loss  OSA Continue usual home CPAP  Chronic pain and neuropathy Continue usual Suboxone and gabapentin doses  Celiac disease Checking vitamin levels due to possibility of malabsorption  Depression/PTSD Psychiatry to see   Family Communication:  Status is: Observation  The patient remains OBS appropriate and will d/c before 2 midnights.  Dispo: The patient is from: Home              Anticipated d/c is to: Home              Patient currently is not medically stable to d/c.   Difficult to place patient No    Objective: Blood pressure 112/72, pulse 80, temperature 97.6 F (36.4 C), temperature source Oral, resp. rate 13, height 5\' 11"  (1.803 m), weight 79.8 kg, SpO2 96 %.  Intake/Output Summary (Last 24 hours) at 11/10/2020 11/12/2020 Last data filed at 11/09/2020 1308 Gross per 24 hour  Intake --  Output 250 ml  Net -250 ml   Filed Weights   11/09/20 1253  Weight: 79.8 kg    Examination: General: No acute respiratory distress Lungs: Clear to auscultation bilaterally without wheezes or crackles Cardiovascular: Regular rate and rhythm without murmur gallop or rub normal S1 and S2 Abdomen: Nontender, nondistended, soft, bowel sounds positive, no rebound, no ascites, no appreciable mass Extremities: No significant cyanosis, clubbing, or edema bilateral lower extremities  CBC: Recent Labs  Lab 11/09/20 1230 11/10/20 0500  WBC 4.5 3.5*  NEUTROABS 2.5  --   HGB 12.5* 11.8*  HCT 38.3* 35.9*  MCV 99.5 98.1  PLT 174 152   Basic Metabolic Panel: Recent Labs  Lab 11/09/20 1230 11/10/20 0500  NA 138 138  K 4.4 3.8  CL 99 99  CO2 30 31  GLUCOSE 94 96  BUN 16 19  CREATININE 0.78 0.86  CALCIUM 9.0 8.5*  MG 1.9  --    GFR: Estimated Creatinine Clearance: 81.5 mL/min (by C-G formula based on SCr of 0.86 mg/dL).  Liver Function Tests: Recent Labs  Lab 11/10/20 0500  AST 33  ALT 20  ALKPHOS 126   BILITOT 1.2  PROT 6.1*  ALBUMIN 3.3*    Recent Labs  Lab 11/09/20 1935  AMMONIA 17    HbA1C: Hgb A1c MFr Bld  Date/Time Value Ref Range Status  10/02/2020 03:01 AM 5.0 4.8 - 5.6 % Final    Comment:    (NOTE) Pre diabetes:          5.7%-6.4%  Diabetes:              >6.4%  Glycemic control for   <7.0% adults with diabetes      Recent Results (from the past 240 hour(s))  Resp Panel by RT-PCR (Flu A&B, Covid) Nasopharyngeal Swab     Status: None   Collection Time: 11/09/20  7:04 PM   Specimen: Nasopharyngeal Swab; Nasopharyngeal(NP) swabs in vial transport medium  Result Value Ref Range Status   SARS Coronavirus 2 by RT PCR NEGATIVE NEGATIVE Final    Comment: (NOTE) SARS-CoV-2 target nucleic acids are NOT DETECTED.  The SARS-CoV-2 RNA is generally detectable in upper respiratory specimens during the acute phase of infection. The lowest concentration of SARS-CoV-2 viral copies this assay can detect is 138 copies/mL. A negative result does not preclude SARS-Cov-2 infection and should not be used as the sole basis for treatment or other patient management decisions. A negative result may occur with  improper specimen collection/handling, submission of specimen other than nasopharyngeal swab, presence of viral mutation(s) within the areas targeted by this assay, and inadequate number of viral copies(<138 copies/mL). A negative result must be combined with clinical observations, patient history, and epidemiological information. The expected result is Negative.  Fact Sheet for Patients:  BloggerCourse.com  Fact Sheet for Healthcare Providers:  SeriousBroker.it  This test is no t yet approved or cleared by the [place] FDA and  has been authorized for detection and/or diagnosis of SARS-CoV-2 by FDA under an Emergency Use Authorization (EUA). This EUA will remain  in effect (meaning this test can be used) for the  duration of the COVID-19 declaration under Section 564(b)(1) of the Act, 21 U.S.C.section 360bbb-3(b)(1), unless the authorization is terminated  or revoked sooner.       Influenza A by PCR NEGATIVE NEGATIVE Final   Influenza B by PCR NEGATIVE NEGATIVE Final    Comment: (NOTE) The Xpert Xpress SARS-CoV-2/FLU/RSV plus assay is intended as an aid in the diagnosis of influenza from Nasopharyngeal swab specimens and should not be used as a sole basis for treatment. Nasal washings and aspirates are unacceptable for Xpert Xpress SARS-CoV-2/FLU/RSV testing.  Fact Sheet for Patients: BloggerCourse.com  Fact Sheet for Healthcare Providers: SeriousBroker.it  This test is not yet approved or cleared by the [place] FDA and has been authorized for detection and/or diagnosis of SARS-CoV-2 by FDA under an Emergency Use Authorization (EUA). This EUA will remain in effect (meaning this test can be used) for the duration of the COVID-19 declaration under Section 564(b)(1) of the Act, 21 U.S.C. section 360bbb-3(b)(1), unless the authorization is terminated or revoked.  Performed at [facility], [address], [place], [place] [address]      Scheduled Meds:  buprenorphine-naloxone  1 tablet Sublingual Daily   enoxaparin (LOVENOX) injection  40 mg Subcutaneous Q24H   gabapentin  300 mg Oral TID   sodium chloride flush  3 mL Intravenous Q12H      LOS: 0 days   Lonia Blood, MD [facility] Office  [phone] Pager - Text Page per Loretha Stapler  If 7PM-7AM, please contact night-coverage per Amion 11/10/2020, 9:22 AM

## 2020-11-10 NOTE — Progress Notes (Signed)
EEG completed, results pending. 

## 2020-11-10 NOTE — Procedures (Signed)
Patient Name: Jack Neal  MRN: [id number]  Epilepsy Attending: Charlsie Quest  Referring Physician/Provider: Dr Beola Cord Date: 11/10/2020 Duration: 26.19 mins  Patient history: 74yo M with ams and worsening tremors. EEG to evaluate for seizure  Level of alertness: Awake  AEDs during EEG study: None  Technical aspects: This EEG study was done with scalp electrodes positioned according to the 10-20 International system of electrode placement. Electrical activity was acquired at a sampling rate of 500Hz  and reviewed with a high frequency filter of 70Hz  and a low frequency filter of 1Hz . EEG data were recorded continuously and digitally stored.   Description: The posterior dominant rhythm consists of 9Hz  activity of moderate voltage (25-35 uV) seen predominantly in posterior head regions, symmetric and reactive to eye opening and eye closing. EEG showed intermittent generalized 3 to 6 Hz theta-delta slowing.  Multiple episodes of brief whole body jerks and bilateral lower extremity jerks were noted. Concomitant EEG before, during and after the event did not show any EEG changes suggest seizure.  Physiologic photic driving was not seen during photic stimulation.  Hyperventilation was not performed.     ABNORMALITY - Intermittent slow, generalized  IMPRESSION: This study is suggestive of mild diffuse encephalopathy, nonspecific etiology. No seizures or epileptiform discharges were seen throughout the recording.  Multiple episodes of brief whole body jerks and bilateral lower extremity jerks were noted without concomitant EEG change. These episodes were NOT epileptic.   Pami Wool 

## 2020-11-11 ENCOUNTER — Telehealth: Payer: Self-pay | Admitting: Cardiology

## 2020-11-11 ENCOUNTER — Observation Stay (HOSPITAL_COMMUNITY): Payer: No Typology Code available for payment source

## 2020-11-11 ENCOUNTER — Other Ambulatory Visit: Payer: Self-pay

## 2020-11-11 ENCOUNTER — Inpatient Hospital Stay (HOSPITAL_COMMUNITY): Payer: No Typology Code available for payment source

## 2020-11-11 DIAGNOSIS — F4310 Post-traumatic stress disorder, unspecified: Secondary | ICD-10-CM | POA: Diagnosis present

## 2020-11-11 DIAGNOSIS — G4700 Insomnia, unspecified: Secondary | ICD-10-CM | POA: Diagnosis present

## 2020-11-11 DIAGNOSIS — I482 Chronic atrial fibrillation, unspecified: Secondary | ICD-10-CM | POA: Diagnosis present

## 2020-11-11 DIAGNOSIS — K219 Gastro-esophageal reflux disease without esophagitis: Secondary | ICD-10-CM | POA: Diagnosis present

## 2020-11-11 DIAGNOSIS — T426X5A Adverse effect of other antiepileptic and sedative-hypnotic drugs, initial encounter: Secondary | ICD-10-CM | POA: Diagnosis present

## 2020-11-11 DIAGNOSIS — Z8249 Family history of ischemic heart disease and other diseases of the circulatory system: Secondary | ICD-10-CM | POA: Diagnosis not present

## 2020-11-11 DIAGNOSIS — R4689 Other symptoms and signs involving appearance and behavior: Secondary | ICD-10-CM

## 2020-11-11 DIAGNOSIS — Z66 Do not resuscitate: Secondary | ICD-10-CM | POA: Diagnosis present

## 2020-11-11 DIAGNOSIS — R471 Dysarthria and anarthria: Secondary | ICD-10-CM | POA: Diagnosis present

## 2020-11-11 DIAGNOSIS — F329 Major depressive disorder, single episode, unspecified: Secondary | ICD-10-CM | POA: Diagnosis present

## 2020-11-11 DIAGNOSIS — R251 Tremor, unspecified: Secondary | ICD-10-CM

## 2020-11-11 DIAGNOSIS — Z880 Allergy status to penicillin: Secondary | ICD-10-CM | POA: Diagnosis not present

## 2020-11-11 DIAGNOSIS — G629 Polyneuropathy, unspecified: Secondary | ICD-10-CM | POA: Diagnosis present

## 2020-11-11 DIAGNOSIS — F330 Major depressive disorder, recurrent, mild: Secondary | ICD-10-CM | POA: Diagnosis not present

## 2020-11-11 DIAGNOSIS — G251 Drug-induced tremor: Secondary | ICD-10-CM | POA: Diagnosis present

## 2020-11-11 DIAGNOSIS — D72819 Decreased white blood cell count, unspecified: Secondary | ICD-10-CM | POA: Diagnosis present

## 2020-11-11 DIAGNOSIS — Z888 Allergy status to other drugs, medicaments and biological substances status: Secondary | ICD-10-CM | POA: Diagnosis not present

## 2020-11-11 DIAGNOSIS — G894 Chronic pain syndrome: Secondary | ICD-10-CM | POA: Diagnosis present

## 2020-11-11 DIAGNOSIS — E611 Iron deficiency: Secondary | ICD-10-CM | POA: Diagnosis present

## 2020-11-11 DIAGNOSIS — G47411 Narcolepsy with cataplexy: Secondary | ICD-10-CM | POA: Diagnosis present

## 2020-11-11 DIAGNOSIS — R259 Unspecified abnormal involuntary movements: Secondary | ICD-10-CM | POA: Diagnosis not present

## 2020-11-11 DIAGNOSIS — R4781 Slurred speech: Secondary | ICD-10-CM | POA: Diagnosis present

## 2020-11-11 DIAGNOSIS — D649 Anemia, unspecified: Secondary | ICD-10-CM | POA: Diagnosis present

## 2020-11-11 DIAGNOSIS — Z20822 Contact with and (suspected) exposure to covid-19: Secondary | ICD-10-CM | POA: Diagnosis present

## 2020-11-11 DIAGNOSIS — R519 Headache, unspecified: Secondary | ICD-10-CM | POA: Diagnosis present

## 2020-11-11 DIAGNOSIS — K900 Celiac disease: Secondary | ICD-10-CM | POA: Diagnosis present

## 2020-11-11 DIAGNOSIS — G4733 Obstructive sleep apnea (adult) (pediatric): Secondary | ICD-10-CM | POA: Diagnosis present

## 2020-11-11 DIAGNOSIS — R441 Visual hallucinations: Secondary | ICD-10-CM | POA: Diagnosis present

## 2020-11-11 DIAGNOSIS — R443 Hallucinations, unspecified: Secondary | ICD-10-CM | POA: Diagnosis not present

## 2020-11-11 DIAGNOSIS — I4891 Unspecified atrial fibrillation: Secondary | ICD-10-CM | POA: Diagnosis not present

## 2020-11-11 DIAGNOSIS — Y929 Unspecified place or not applicable: Secondary | ICD-10-CM | POA: Diagnosis not present

## 2020-11-11 LAB — CBC
HCT: 36.4 % — ABNORMAL LOW (ref 39.0–52.0)
Hemoglobin: 12 g/dL — ABNORMAL LOW (ref 13.0–17.0)
MCH: 32.2 pg (ref 26.0–34.0)
MCHC: 33 g/dL (ref 30.0–36.0)
MCV: 97.6 fL (ref 80.0–100.0)
Platelets: 151 10*3/uL (ref 150–400)
RBC: 3.73 MIL/uL — ABNORMAL LOW (ref 4.22–5.81)
RDW: 13.3 % (ref 11.5–15.5)
WBC: 3.8 10*3/uL — ABNORMAL LOW (ref 4.0–10.5)
nRBC: 0 % (ref 0.0–0.2)

## 2020-11-11 LAB — RAPID URINE DRUG SCREEN, HOSP PERFORMED
Amphetamines: NOT DETECTED
Barbiturates: NOT DETECTED
Benzodiazepines: NOT DETECTED
Cocaine: NOT DETECTED
Opiates: NOT DETECTED
Tetrahydrocannabinol: NOT DETECTED

## 2020-11-11 LAB — PHOSPHORUS: Phosphorus: 4.7 mg/dL — ABNORMAL HIGH (ref 2.5–4.6)

## 2020-11-11 LAB — COMPREHENSIVE METABOLIC PANEL
ALT: 18 U/L (ref 0–44)
AST: 32 U/L (ref 15–41)
Albumin: 3.2 g/dL — ABNORMAL LOW (ref 3.5–5.0)
Alkaline Phosphatase: 114 U/L (ref 38–126)
Anion gap: 6 (ref 5–15)
BUN: 17 mg/dL (ref 8–23)
CO2: 31 mmol/L (ref 22–32)
Calcium: 8.5 mg/dL — ABNORMAL LOW (ref 8.9–10.3)
Chloride: 101 mmol/L (ref 98–111)
Creatinine, Ser: 0.78 mg/dL (ref 0.61–1.24)
GFR, Estimated: >60 mL/min (ref >60)
Glucose, Bld: 93 mg/dL (ref 70–99)
Potassium: 3.9 mmol/L (ref 3.5–5.1)
Sodium: 138 mmol/L (ref 135–145)
Total Bilirubin: 1.2 mg/dL (ref 0.3–1.2)
Total Protein: 6.1 g/dL — ABNORMAL LOW (ref 6.5–8.1)

## 2020-11-11 LAB — RETICULOCYTES
Immature Retic Fract: 11.2 % (ref 2.3–15.9)
RBC.: 3.72 MIL/uL — ABNORMAL LOW (ref 4.22–5.81)
Retic Count, Absolute: 32 10*3/uL (ref 19.0–186.0)
Retic Ct Pct: 0.9 % (ref 0.4–3.1)

## 2020-11-11 LAB — IRON AND TIBC
Iron: 65 ug/dL (ref 45–182)
Saturation Ratios: 22 % (ref 17.9–39.5)
TIBC: 295 ug/dL (ref 250–450)
UIBC: 230 ug/dL

## 2020-11-11 LAB — CORTISOL: Cortisol, Plasma: 1.7 ug/dL

## 2020-11-11 LAB — FERRITIN: Ferritin: 37 ng/mL (ref 24–336)

## 2020-11-11 LAB — VITAMIN B12: Vitamin B-12: 1916 pg/mL — ABNORMAL HIGH (ref 180–914)

## 2020-11-11 LAB — FOLATE: Folate: 18.9 ng/mL (ref >5.9)

## 2020-11-11 MED ORDER — IOHEXOL 350 MG/ML SOLN
100.0000 mL | Freq: Once | INTRAVENOUS | Status: AC | PRN
  Administered 2020-11-11: 100 mL via INTRAVENOUS

## 2020-11-11 MED ORDER — METOPROLOL TARTRATE 5 MG/5ML IV SOLN
INTRAVENOUS | Status: AC
  Filled 2020-11-11: qty 5

## 2020-11-11 NOTE — Consult Note (Addendum)
[facility] Face-to-Face Psychiatry Consult   Reason for Consult:  hallucinations- possibly of psychogenic etiology Referring Physician:  Jetty Duhamel MD Patient Identification: Dameon Soltis MRN:  [id number] Principal Diagnosis: AMS (altered mental status) Diagnosis:  Principal Problem:   AMS (altered mental status) Active Problems:   AF (paroxysmal atrial fibrillation) ([name])   Major depressive disorder   OSA on CPAP   Tremor   Total Time spent with patient: 30 minutes  Subjective:   Onur Mori is a 75 y.o. male patient admitted with worsening tremors and consulted to psychiatry for hallucinations.  He says that the tremors have been ongoing for the past 2 years, that they were mild until July 19 of this year where he had an episode of the tremors lasting for 5 hours.  He has had no recent changes to his medications.  He began having physical difficulties after his mini gastric bypass surgery 5 years ago.  From there, he developed peripheral neuropathy and began taking gabapentin 300 mg 3 times daily between 2 to 4 years ago.  He reports that there was no significant event leading up to the prolonged tremors on July 19; his anxiety and depression have been well managed, and he experienced no traumatic nor life-changing events.  There are no precipitating factors to the tremors, and he is unable to control them once they start, although he is fully conscious during these episodes.  Also of note, he has ongoing dizziness with 3 falls over the last 7 weeks.  Although these are significant events for him, they seem to be unrelated to the tremors.  He endorses poor sleep at home, getting an average of 1 to 2 hours per night with minimal nap time throughout the day.  However on the doxepin and hydroxyzine, he has been getting an average of 3-1/2 hours of sleep.  He denies current SI, but chronically endorses SI that [facility] through which he has a safety plan that he  regularly follows.  He denies HI, but endorses having visual hallucinations of holding a candle as well as a cheerleader coming into his hospital room.  He says that in the hospital is the first time that he has had hallucinations.  From previous documentation, his wife has observed him conversing with someone who was not present in the room.  He denies tobacco use, alcohol use, and recreational drug use, although he admits that he developed a tolerance to Vicodin after taking it for pain management post gastric bypass.  He is currently taking Suboxone, which his primary care provider is weaning. He is very pleasant and open to anything to identify the cause of his tremors.  HPI: Tinnie Gens is a 75 year old male with a psychiatric history of anxiety, depression, PTSD, opioid dependence, and narcolepsy with cataplexy and a medical history of paroxysmal A. fib, s/p mini gastric bypass surgery admitted for worsening tremors of BUE and BLE with no loss of consciousness and consulted to the psychiatric service for evaluation of hallucinations, possibly of a psychogenic nature.  Past Psychiatric History: See below  Risk to Self:  No Risk to Others:  No Prior Inpatient Therapy:  No Prior [facility]:  [facility]  Past Medical History:  Past Medical History:  Diagnosis Date   AF (paroxysmal atrial fibrillation) ([name]) 10/01/2020   Atrial fibrillation ([name])    Celiac disease 10/01/2020   Chronic pain syndrome 10/01/2020   Depression    GERD (gastroesophageal reflux disease)  History of gastric bypass 10/01/2020   Hypotension    Major depressive disorder 10/01/2020   Neuropathy    OSA on CPAP 10/01/2020   Pacemaker    Peripheral polyneuropathy 10/01/2020   Presence of cardiac pacemaker 10/01/2020   PTSD (post-traumatic stress disorder) 10/01/2020   History reviewed. No pertinent surgical history. Family History:  Family History  Problem Relation Age of Onset   Heart disease Neg Hx    Family  Psychiatric  History: None reported Social History:  Social History   Substance and Sexual Activity  Alcohol Use Not Currently     Social History   Substance and Sexual Activity  Drug Use Never    Social History   Socioeconomic History   Marital status: Married    Spouse name: Not on file   Number of children: Not on file   Years of education: Not on file   Highest education level: Not on file  Occupational History   Not on file  Tobacco Use   Smoking status: Former    Types: Cigarettes   Smokeless tobacco: Never  Substance and Sexual Activity   Alcohol use: Not Currently   Drug use: Never   Sexual activity: Yes  Other Topics Concern   Not on file  Social History Narrative   Not on file   Social Determinants of Health   Financial Resource Strain: Not on file  Food Insecurity: Not on file  Transportation Needs: Not on file  Physical Activity: Not on file  Stress: Not on file  Social Connections: Not on file   Additional Social History:    Allergies:   Allergies  Allergen Reactions   Amphetamine Rash and Other (See Comments)    Altered mental status- amnesia (also)   Diazepam Rash    Other reaction(s): Amnesia, Mental Status Changes (intolerance), Other amnesia    Zoloft [Sertraline] Anxiety, Rash and Other (See Comments)    Panic attacks and Cardiovascular Arrest (ALSO)    Gluten Meal Diarrhea   Penicillin G Rash    Labs:  Results for orders placed or performed during the hospital encounter of 11/09/20 (from the past 48 hour(s))  Resp Panel by RT-PCR (Flu A&B, Covid) Nasopharyngeal Swab     Status: None   Collection Time: 11/09/20  7:04 PM   Specimen: Nasopharyngeal Swab; Nasopharyngeal(NP) swabs in vial transport medium  Result Value Ref Range   SARS Coronavirus 2 by RT PCR NEGATIVE NEGATIVE    Comment: (NOTE) SARS-CoV-2 target nucleic acids are NOT DETECTED.  The SARS-CoV-2 RNA is generally detectable in upper respiratory specimens during the  acute phase of infection. The lowest concentration of SARS-CoV-2 viral copies this assay can detect is 138 copies/mL. A negative result does not preclude SARS-Cov-2 infection and should not be used as the sole basis for treatment or other patient management decisions. A negative result may occur with  improper specimen collection/handling, submission of specimen other than nasopharyngeal swab, presence of viral mutation(s) within the areas targeted by this assay, and inadequate number of viral copies(<138 copies/mL). A negative result must be combined with clinical observations, patient history, and epidemiological information. The expected result is Negative.  Fact Sheet for Patients:  BloggerCourse.com  Fact Sheet for Healthcare Providers:  SeriousBroker.it  This test is no t yet approved or cleared by the [place] FDA and  has been authorized for detection and/or diagnosis of SARS-CoV-2 by FDA under an Emergency Use Authorization (EUA). This EUA will remain  in effect (meaning this test  can be used) for the duration of the COVID-19 declaration under Section 564(b)(1) of the Act, 21 U.S.C.section 360bbb-3(b)(1), unless the authorization is terminated  or revoked sooner.       Influenza A by PCR NEGATIVE NEGATIVE   Influenza B by PCR NEGATIVE NEGATIVE    Comment: (NOTE) The Xpert Xpress SARS-CoV-2/FLU/RSV plus assay is intended as an aid in the diagnosis of influenza from Nasopharyngeal swab specimens and should not be used as a sole basis for treatment. Nasal washings and aspirates are unacceptable for Xpert Xpress SARS-CoV-2/FLU/RSV testing.  Fact Sheet for Patients: BloggerCourse.com  Fact Sheet for Healthcare Providers: SeriousBroker.it  This test is not yet approved or cleared by the [place] FDA and has been authorized for detection and/or diagnosis of  SARS-CoV-2 by FDA under an Emergency Use Authorization (EUA). This EUA will remain in effect (meaning this test can be used) for the duration of the COVID-19 declaration under Section 564(b)(1) of the Act, 21 U.S.C. section 360bbb-3(b)(1), unless the authorization is terminated or revoked.  Performed at [facility], [address] [place], [place] [address]   Ammonia     Status: None   Collection Time: 11/09/20  7:35 PM  Result Value Ref Range   Ammonia 17 9 - 35 umol/L    Comment: Performed at [facility], [address] [place], [place] [address]  Comprehensive metabolic panel     Status: Abnormal   Collection Time: 11/10/20  5:00 AM  Result Value Ref Range   Sodium 138 135 - 145 mmol/L   Potassium 3.8 3.5 - 5.1 mmol/L   Chloride 99 98 - 111 mmol/L   CO2 31 22 - 32 mmol/L   Glucose, Bld 96 70 - 99 mg/dL    Comment: Glucose reference range applies only to samples taken after fasting for at least 8 hours.   BUN 19 8 - 23 mg/dL   Creatinine, Ser 6.07 0.61 - 1.24 mg/dL   Calcium 8.5 (L) 8.9 - 10.3 mg/dL   Total Protein 6.1 (L) 6.5 - 8.1 g/dL   Albumin 3.3 (L) 3.5 - 5.0 g/dL   AST 33 15 - 41 U/L   ALT 20 0 - 44 U/L   Alkaline Phosphatase 126 38 - 126 U/L   Total Bilirubin 1.2 0.3 - 1.2 mg/dL   GFR, Estimated >37 >10 mL/min    Comment: (NOTE) Calculated using the CKD-EPI Creatinine Equation (2021)    Anion gap 8 5 - 15    Comment: Performed at [facility], [address], [place], [place] [address]  CBC     Status: Abnormal   Collection Time: 11/10/20  5:00 AM  Result Value Ref Range   WBC 3.5 (L) 4.0 - 10.5 K/uL   RBC 3.66 (L) 4.22 - 5.81 MIL/uL   Hemoglobin 11.8 (L) 13.0 - 17.0 g/dL   HCT 85.4 (L) 62.7 - 03.5 %   MCV 98.1 80.0 - 100.0 fL   MCH 32.2 26.0 - 34.0 pg   MCHC 32.9 30.0 - 36.0 g/dL   RDW 00.9 38.1 - 82.9 %   Platelets 152 150 - 400 K/uL   nRBC 0.0 0.0 - 0.2 %    Comment: Performed at [facility], [address]  [place], [place] [address]  Vitamin B12     Status: Abnormal   Collection Time: 11/11/20  2:57 AM  Result Value Ref Range   Vitamin B-12 1,916 (H) 180 - 914 pg/mL  Comment: (NOTE) This assay is not validated for testing neonatal or myeloproliferative syndrome specimens for Vitamin B12 levels. Performed at [facility], [address], [place], [place] [address]   Folate     Status: None   Collection Time: 11/11/20  2:57 AM  Result Value Ref Range   Folate 18.9 >5.9 ng/mL    Comment: Performed at [facility], [address] [place], [place] [address]  Iron and TIBC     Status: None   Collection Time: 11/11/20  2:57 AM  Result Value Ref Range   Iron 65 45 - 182 ug/dL   TIBC 119 417 - 408 ug/dL   Saturation Ratios 22 17.9 - 39.5 %   UIBC 230 ug/dL    Comment: Performed at [facility], [address], [place], [place] [address]  Ferritin     Status: None   Collection Time: 11/11/20  2:57 AM  Result Value Ref Range   Ferritin 37 24 - 336 ng/mL    Comment: Performed at [facility], [address], [place], [place] [address]  Reticulocytes     Status: Abnormal   Collection Time: 11/11/20  2:57 AM  Result Value Ref Range   Retic Ct Pct 0.9 0.4 - 3.1 %   RBC. 3.72 (L) 4.22 - 5.81 MIL/uL   Retic Count, Absolute 32.0 19.0 - 186.0 K/uL   Immature Retic Fract 11.2 2.3 - 15.9 %    Comment: Performed at [facility], [address] [place], [place] [address]  Comprehensive metabolic panel     Status: Abnormal   Collection Time: 11/11/20  2:57 AM  Result Value Ref Range   Sodium 138 135 - 145 mmol/L   Potassium 3.9 3.5 - 5.1 mmol/L   Chloride 101 98 - 111 mmol/L   CO2 31 22 - 32 mmol/L   Glucose, Bld 93 70 - 99 mg/dL    Comment: Glucose reference range applies only to samples taken after fasting for at least 8 hours.   BUN 17 8 - 23 mg/dL   Creatinine, Ser 6.37 0.61 - 1.24 mg/dL   Calcium 8.5 (L) 8.9 - 10.3 mg/dL   Total Protein 6.1 (L) 6.5 - 8.1 g/dL    Albumin 3.2 (L) 3.5 - 5.0 g/dL   AST 32 15 - 41 U/L   ALT 18 0 - 44 U/L   Alkaline Phosphatase 114 38 - 126 U/L   Total Bilirubin 1.2 0.3 - 1.2 mg/dL   GFR, Estimated >85 >88 mL/min    Comment: (NOTE) Calculated using the CKD-EPI Creatinine Equation (2021)    Anion gap 6 5 - 15    Comment: Performed at [facility], [address] [place], [place] [address]  CBC     Status: Abnormal   Collection Time: 11/11/20  2:57 AM  Result Value Ref Range   WBC 3.8 (L) 4.0 - 10.5 K/uL   RBC 3.73 (L) 4.22 - 5.81 MIL/uL   Hemoglobin 12.0 (L) 13.0 - 17.0 g/dL   HCT 41.2 (L) 87.8 - 67.6 %   MCV 97.6 80.0 - 100.0 fL   MCH 32.2 26.0 - 34.0 pg   MCHC 33.0 30.0 - 36.0 g/dL   RDW 72.0 94.7 - 09.6 %   Platelets 151 150 - 400 K/uL   nRBC 0.0 0.0 - 0.2 %    Comment: Performed at [facility], [address], [place], [place] [address]  Cortisol     Status: None   Collection Time: 11/11/20  2:57 AM  Result Value Ref Range   Cortisol, Plasma 1.7 ug/dL    Comment: (NOTE) AM    6.7 - 22.6 ug/dL PM   <09.8       ug/dL Performed at [facility], [address] [place], [place] [address]   Phosphorus     Status: Abnormal   Collection Time: 11/11/20  2:57 AM  Result Value Ref Range   Phosphorus 4.7 (H) 2.5 - 4.6 mg/dL    Comment: Performed at [facility], [address], [place], [place] [address]  Urine rapid drug screen (hosp performed)     Status: None   Collection Time: 11/11/20  2:40 PM  Result Value Ref Range   Opiates NONE DETECTED NONE DETECTED   Cocaine NONE DETECTED NONE DETECTED   Benzodiazepines NONE DETECTED NONE DETECTED   Amphetamines NONE DETECTED NONE DETECTED   Tetrahydrocannabinol NONE DETECTED NONE DETECTED   Barbiturates NONE DETECTED NONE DETECTED    Comment: (NOTE) DRUG SCREEN FOR MEDICAL PURPOSES ONLY.  IF CONFIRMATION IS NEEDED FOR ANY PURPOSE, NOTIFY LAB WITHIN 5 DAYS.  LOWEST DETECTABLE LIMITS FOR URINE DRUG SCREEN Drug Class                      Cutoff (ng/mL) Amphetamine and metabolites    1000 Barbiturate and metabolites    200 Benzodiazepine                 200 Tricyclics and metabolites     300 Opiates and metabolites        300 Cocaine and metabolites        300 THC                            50 Performed at [facility], [address] [place], [place] [address]     Current Facility-Administered Medications  Medication Dose Route Frequency Provider Last Rate Last Admin   acetaminophen (TYLENOL) tablet 650 mg  650 mg Oral Q6H PRN Synetta Fail, MD   650 mg at 11/11/20 1127   buprenorphine-naloxone (SUBOXONE) 2-0.5 mg per SL tablet 1 tablet  1 tablet Sublingual Daily Synetta Fail, MD   1 tablet at 11/11/20 8657   diclofenac Sodium (VOLTAREN) 1 % topical gel 2 g  2 g Topical Daily Jetty Duhamel T, MD       enoxaparin (LOVENOX) injection 40 mg  40 mg Subcutaneous Q24H Synetta Fail, MD   40 mg at 11/10/20 1938   gabapentin (NEURONTIN) capsule 300 mg  300 mg Oral TID Synetta Fail, MD   300 mg at 11/11/20 1708   hydrOXYzine (ATARAX/VISTARIL) tablet 25 mg  25 mg Oral Q6H PRN Synetta Fail, MD   25 mg at 11/11/20 1130   polyethylene glycol (MIRALAX / GLYCOLAX) packet 17 g  17 g Oral Daily PRN Synetta Fail, MD       sodium chloride flush (NS) 0.9 % injection 3 mL  3 mL Intravenous Q12H Synetta Fail, MD   3 mL at 11/11/20 8469   vitamin B-12 (CYANOCOBALAMIN) tablet 1,000 mcg  1,000 mcg Oral Daily Lonia Blood, MD   1,000 mcg at 11/11/20 6295    Musculoskeletal: Strength & Muscle Tone: within normal limits and tremors not observed during interview Gait & Station:  walks with  rolling walker Patient leans: N/A   Psychiatric Specialty Exam:  Presentation  General Appearance: Appropriate for Environment; Well Groomed  Eye Contact:Good  Speech:Clear and Coherent  Speech Volume:Normal  Handedness: No data recorded  Mood and Affect   Mood:Euthymic  Affect:Congruent; Appropriate   Thought Process  Thought Processes:Coherent; Linear  Descriptions of Associations:Intact  Orientation:Full (Time, Place and Person)  Thought Content:Logical; WDL  History of Schizophrenia/Schizoaffective disorder:No data recorded Duration of Psychotic Symptoms:No data recorded Hallucinations:Hallucinations: Olfactory; Visual; Tactile Description of Visual Hallucinations: Saw a cheerleader come into his room  Ideas of Reference:None  Suicidal Thoughts:Suicidal Thoughts: No  Homicidal Thoughts:Homicidal Thoughts: No   Sensorium  Memory:Immediate Good; Recent Good; Remote Good  Judgment:Good  Insight:Good   Executive Functions  Concentration:Good  Attention Span:Good  Recall:Good  Fund of Knowledge:Good  Language:Good   Psychomotor Activity  Psychomotor Activity:Psychomotor Activity: Tremor   Assets  Assets:Communication Skills; Desire for Improvement; Resilience; Social Support   Sleep  Sleep:Sleep: Poor Number of Hours of Sleep: 2   Physical Exam: Physical Exam Vitals reviewed.  Constitutional:      Appearance: Normal appearance.  HENT:     Head: Normocephalic and atraumatic.  Eyes:     Extraocular Movements: Extraocular movements intact.  Cardiovascular:     Rate and Rhythm: Normal rate.  Pulmonary:     Effort: Pulmonary effort is normal.  Musculoskeletal:        General: Normal range of motion.     Cervical back: Normal range of motion.  Neurological:     Mental Status: He is alert and oriented to person, place, and time.  Psychiatric:        Behavior: Behavior normal.        Judgment: Judgment normal.   Review of Systems  Musculoskeletal:  Positive for falls.  Neurological:  Positive for dizziness, tremors and weakness.  Psychiatric/Behavioral:  Positive for depression and hallucinations. Negative for memory loss, substance abuse and suicidal ideas. The patient has insomnia. The  patient is not nervous/anxious.   All other systems reviewed and are negative. Blood pressure 124/82, pulse 82, temperature (!) 97.4 F (36.3 C), temperature source Oral, resp. rate 18, height 5\' 11"  (1.803 m), weight 77.7 kg, SpO2 100 %. Body mass index is 23.89 kg/m.  Treatment Plan Summary: Dorene SorrowJerry is a 75 year old male with a psychiatric history of MDD, PTSD, and anxiety and a medical history of gastric bypass surgery consulted to the psychiatric service for hallucination and possible psychogenic tremors.  On assessment, Alessandro's timeline of taking gabapentin and the onset of mild tremors align.  Side effects of gabapentin  include tremors, headaches, hallucinations, dry mouth, and dizziness, all of which he is experiencing.  There seems to be no psychogenic cause of his tremors, as his anxiety has been well controlled, and he has had minimal stressors. Since the surgery, Dorene SorrowJerry seems to have increased sensitivity to/absorption of medications, as he developed a tolerance to Vicodin taking one pill per day, as prescribed. He also noted feeling immediate effect of Duloxetine when he started it. Dorene SorrowJerry does not meet criteria for an inpatient psychiatric admission.  Recommendations: -Hold home Gabapentin (300 mg TID) -Restart home Cymbalta 30 mg po TID for anxiety/depression and neuropathy once EEGs have been completed by neurology.  Disposition: No evidence of imminent risk to self or others at present.   Patient does not meet criteria for psychiatric inpatient admission. Defer to primary team. Psychiatry will continue to follow.   Lamar Sprinklesourtney Mckynleigh Mussell, MD 11/11/2020 6:21 PM

## 2020-11-11 NOTE — Progress Notes (Signed)
LTM EEG hooked up and running - no initial skin breakdown - push button tested - neuro notified.  

## 2020-11-11 NOTE — Telephone Encounter (Signed)
New message:    Patient calling stating that he is in the hospital and would like to know if he can take his CT while he is in the hospital or do he need to reschedule.

## 2020-11-11 NOTE — Progress Notes (Signed)
Patient unavailable-off the floor.  Will re-attempt as soon as possible.

## 2020-11-11 NOTE — Progress Notes (Signed)
Jack Neal  ZOX:[id number] DOB: Jul 16, 1945 DOA: 11/09/2020 PCP: [facility]    Brief Narrative:  75 year old with a history of atrial fibrillation, depression, PTSD, celiac disease, chronic pain, status post gastric bypass, OSA, GERD, and pacemaker placement who presented to the ER with increasing "tremors" at home.  The patient was admitted in July 2020 with similar symptoms and evaluated for possible seizure with a negative EEG and unremarkable MRI.  Though he was not started on medication at the time Keppra was initiated through the [place] in a subsequent outpatient follow-up.  The patient reports ongoing episodes of tremors several times a day every day since that time.  They are described as bilateral shaking of the arms and legs without alteration in consciousness lasting minutes at most.  One of his episodes happened while he was in the ED and the patient remained conscious and was talking during the episode.  His wife also reports intermittent hallucinations with the patient talking to people who are not there and at times slurring his speech or using nonsensical speech.  Intermittent jerky involuntary movements of entire body, mostly below the trunk, spells lasting from a few seconds to 30 to 60 seconds, associated sometimes with visual hallucinations/dysarthria/gibberish speech/odor/headache and leaves him fatigued.  Keppra has not helped-started 3 weeks ago.  Neurology consulted 8/29  Significant Events:  8/27 admit via ED 8/28 EEG -no seizure activity noted  Consultants:  Psychiatry Neurology  Code Status: NO CODE BLUE  Antimicrobials:  None  DVT prophylaxis: Lovenox  Subjective: Ongoing intermittent jerking movements.  I witnessed about 4-5 of this while I was in patient's room within a span of about 5 minutes, shortness to her about less than 5 seconds and the longest one was about 30 seconds, unable to stop it on his own, spontaneously resolved, seem to leave him  exhausted.  No LOC.  Assessment & Plan:  Involuntary movements, intermittently associated with visual hallucinations/dysarthria/gibberish speech/odor/headache Unclear etiology.  DD: Myoclonus/focal seizures versus others. Etiology unclear -description not suggestive of generalized seizure -recent normal brain MRI -CT head this admission reveals only generalized brain atrophy -evaluate for possible metabolic etiology -psychiatry to eval for possible psychogenic etiology Requested neurology consultation.?  Need for video EEG, DC Keppra, initiate Topamax etc. UDS negative.  Chronic atrial fibrillation Rate controlled -does not appear to be on long-term anticoagulation  Normocytic anemia Stable.  Also with mild leukopenia.  OSA Continue usual home CPAP  Chronic pain and neuropathy Continue usual Suboxone and gabapentin doses.  Has not changed doses in several years.  Trying to wean off Suboxone  Celiac disease/iron deficiency Checking vitamin levels due to possibility of malabsorption.  B12: 1916, folate 18.9, ferritin 37.  Consider starting iron supplements as outpatient.  Depression/PTSD Psychiatry to see   Family Communication:  Status is: Inpatient    Dispo: The patient is from: Home              Anticipated d/c is to: Home              Patient currently is not medically stable to d/c.   Difficult to place patient No    Objective:  Vitals:   11/11/20 0448 11/11/20 0722 11/11/20 1207 11/11/20 1637  BP: 98/62 118/76 97/65 124/82  Pulse: 79 82 81 82  Resp: 15 20 20 18   Temp: 97.7 F (36.5 C) 98.1 F (36.7 C) 97.6 F (36.4 C) (!) 97.4 F (36.3 C)  TempSrc: Oral Oral Oral Oral  SpO2: 94%  94% 97% 100%  Weight: 77.7 kg     Height:        Examination: General exam: Elderly male, moderately built and nourished lying comfortably propped up in bed without distress. Respiratory system: Clear to auscultation. Respiratory effort normal. Cardiovascular system: S1 & S2  heard, RRR. No JVD, murmurs, rubs, gallops or clicks. No pedal edema.  Not on telemetry. Gastrointestinal system: Abdomen is nondistended, soft and nontender. No organomegaly or masses felt. Normal bowel sounds heard. Central nervous system: Alert and oriented. No focal neurological deficits.  Intermittent involuntary generalized jerking movements noted as described above. Extremities: Symmetric 5 x 5 power. Skin: No rashes, lesions or ulcers Psychiatry: Judgement and insight appear normal. Mood & affect appropriate.    CBC: Recent Labs  Lab 11/09/20 1230 11/10/20 0500 11/11/20 0257  WBC 4.5 3.5* 3.8*  NEUTROABS 2.5  --   --   HGB 12.5* 11.8* 12.0*  HCT 38.3* 35.9* 36.4*  MCV 99.5 98.1 97.6  PLT 174 152 151   Basic Metabolic Panel: Recent Labs  Lab 11/09/20 1230 11/10/20 0500 11/11/20 0257  NA 138 138 138  K 4.4 3.8 3.9  CL 99 99 101  CO2 30 31 31   GLUCOSE 94 96 93  BUN 16 19 17   CREATININE 0.78 0.86 0.78  CALCIUM 9.0 8.5* 8.5*  MG 1.9  --   --   PHOS  --   --  4.7*   GFR: Estimated Creatinine Clearance: 87.6 mL/min (by C-G formula based on SCr of 0.78 mg/dL).  Liver Function Tests: Recent Labs  Lab 11/10/20 0500 11/11/20 0257  AST 33 32  ALT 20 18  ALKPHOS 126 114  BILITOT 1.2 1.2  PROT 6.1* 6.1*  ALBUMIN 3.3* 3.2*    Recent Labs  Lab 11/09/20 1935  AMMONIA 17    HbA1C: Hgb A1c MFr Bld  Date/Time Value Ref Range Status  10/02/2020 03:01 AM 5.0 4.8 - 5.6 % Final    Comment:    (NOTE) Pre diabetes:          5.7%-6.4%  Diabetes:              >6.4%  Glycemic control for   <7.0% adults with diabetes      Recent Results (from the past 240 hour(s))  Resp Panel by RT-PCR (Flu A&B, Covid) Nasopharyngeal Swab     Status: None   Collection Time: 11/09/20  7:04 PM   Specimen: Nasopharyngeal Swab; Nasopharyngeal(NP) swabs in vial transport medium  Result Value Ref Range Status   SARS Coronavirus 2 by RT PCR NEGATIVE NEGATIVE Final    Comment:  (NOTE) SARS-CoV-2 target nucleic acids are NOT DETECTED.  The SARS-CoV-2 RNA is generally detectable in upper respiratory specimens during the acute phase of infection. The lowest concentration of SARS-CoV-2 viral copies this assay can detect is 138 copies/mL. A negative result does not preclude SARS-Cov-2 infection and should not be used as the sole basis for treatment or other patient management decisions. A negative result may occur with  improper specimen collection/handling, submission of specimen other than nasopharyngeal swab, presence of viral mutation(s) within the areas targeted by this assay, and inadequate number of viral copies(<138 copies/mL). A negative result must be combined with clinical observations, patient history, and epidemiological information. The expected result is Negative.  Fact Sheet for Patients:  10/04/2020  Fact Sheet for Healthcare Providers:  11/11/20  This test is no t yet approved or cleared by the [employer] FDA and  has  been authorized for detection and/or diagnosis of SARS-CoV-2 by FDA under an Emergency Use Authorization (EUA). This EUA will remain  in effect (meaning this test can be used) for the duration of the COVID-19 declaration under Section 564(b)(1) of the Act, 21 U.S.C.section 360bbb-3(b)(1), unless the authorization is terminated  or revoked sooner.       Influenza A by PCR NEGATIVE NEGATIVE Final   Influenza B by PCR NEGATIVE NEGATIVE Final    Comment: (NOTE) The Xpert Xpress SARS-CoV-2/FLU/RSV plus assay is intended as an aid in the diagnosis of influenza from Nasopharyngeal swab specimens and should not be used as a sole basis for treatment. Nasal washings and aspirates are unacceptable for Xpert Xpress SARS-CoV-2/FLU/RSV testing.  Fact Sheet for Patients: [employer]com  Fact Sheet for Healthcare  Providers: SeriousBroker.it  This test is not yet approved or cleared by the [place] FDA and has been authorized for detection and/or diagnosis of SARS-CoV-2 by FDA under an Emergency Use Authorization (EUA). This EUA will remain in effect (meaning this test can be used) for the duration of the COVID-19 declaration under Section 564(b)(1) of the Act, 21 U.S.C. section 360bbb-3(b)(1), unless the authorization is terminated or revoked.  Performed at [facility], [address], [place], [place] [address]      Scheduled Meds:  buprenorphine-naloxone  1 tablet Sublingual Daily   diclofenac Sodium  2 g Topical Daily   enoxaparin (LOVENOX) injection  40 mg Subcutaneous Q24H   gabapentin  300 mg Oral TID   sodium chloride flush  3 mL Intravenous Q12H   cyanocobalamin  1,000 mcg Oral Daily      LOS: 0 days   Marcellus Scott, MD, [place], [facility]. [facility]  To contact the attending provider between 7A-7P or the covering provider during after hours 7P-7A, please log into the web site [email or web address] and access using universal [place] password for that web site. If you do not have the password, please call the hospital operator.

## 2020-11-11 NOTE — Telephone Encounter (Signed)
CT completed today.

## 2020-11-11 NOTE — Consult Note (Signed)
Neurology Consultation  Reason for Consult: "spells"  Referring Physician: A. Hongalgi, MD.    CC: tremor/shaking spells  History is obtained from: patient, chart.   HPI: Jack Neal is a 75 y.o. male with a PMHx of AF s/p PPM not on AC due to hx of SDH, TR, chronic pain syndrome, anemia, GERD, PTSD, depression, periperal neuropathy, and OSA on CPAP. [name] presented on 11/10/20 with c/o increasing severity of tremors at home.   [name] describes these spells as whole body shaking, sometimes involving the head, not brought on by any particular activity/food, and can occur during sleep (wake him up).When he awakens, he is still having the episodes and continues to have them for awhile after he awakens. He sleeps in a different bedroom that his wife, so she does not witness this (see below). He states these spells have lasted anywhere between 1 minute to 5 hours. Can have multiple in one hour, or a few with hours dividing them. No LOC. States he reminds himself to breathe during the spells, but denies any SOB. Not sleepy after spells. He states the spells are accompanied by gibberish speech. Not accompanied by tongue biting, bladder/bowel incontinence, LOC, falling, vision changes.  He states the episodes began October 01, 2020 and he was seen here. His w/up was negative at that time, and these spells were not felt to be epileptic in nature. Neurology f/up was encouraged and he saw Dr. Noel Gerold at the [facility] in [place] earlier this month. Due to suspicion for seizures, Dr. Noel Gerold put patient on a trial of Keppra 250mg  1/2 tab po tid x 1 month. Patient stated he stopped that at home because it did not help after 3 weeks. He did not have time to see out patient neurology again prior to coming to the ED here.   Neurology was consulted during patient's stay here October 03, 2020.  Neurology recommended out patient f/up and workup for patient's tremors, dizziness, abnormal movements, and spells. Patient c/o  dizziness then that he has had chronically. Been negatively worked up in past. Tried Midodrine for dizziness because his BP ran low, but this did not help.   NP inquired as to whether there was a particular life stressor that occurred before or near the start of these spells. He states that about 6 months ago, he came to terms with the fact that his marriage is no longer a marriage as seen in the eyes of others. He states he and his wife sleep in separate beds, that they live more like roommates. He cooks his own food and she cooks hers. They do not interact in the home much (no TV, movies, cards, etc).Wife drives him to appointments and helps with his calendar and finances. He called her his October 05, 2020. He states he is content as to where the relationship is at this time.   NP reviewed his home medications with patient. 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 Neurology was asked to consult for the tremor episodes.   ROS: A robust ROS was performed and is negative except as noted in the HPI.   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   Hypotension    Major depressive disorder 10/01/2020   Neuropathy    OSA  on CPAP 10/01/2020   Pacemaker    Peripheral polyneuropathy 10/01/2020   Presence of cardiac pacemaker 10/01/2020   PTSD (post-traumatic stress disorder) 10/01/2020    Family History  Problem Relation Age of Onset   Heart disease Neg Hx     Social History:   reports that he has quit smoking. His smoking use included cigarettes. He has never used smokeless tobacco. He reports that he does not currently use alcohol. He reports that he does not use drugs.  Medications  Current Facility-Administered Medications:    acetaminophen (TYLENOL) tablet 650 mg,  650 mg, Oral, Q6H PRN, 650 mg at 11/10/20 1938 **OR** [DISCONTINUED] acetaminophen (TYLENOL) suppository 650 mg, 650 mg, Rectal, Q6H PRN, Synetta Fail, MD   buprenorphine-naloxone (SUBOXONE) 2-0.5 mg per SL tablet 1 tablet, 1 tablet, Sublingual, Daily, Synetta Fail, MD, 1 tablet at 11/11/20 4034   diclofenac Sodium (VOLTAREN) 1 % topical gel 2 g, 2 g, Topical, Daily, Sharon Seller, Elpidio Eric, MD   enoxaparin (LOVENOX) injection 40 mg, 40 mg, Subcutaneous, Q24H, Synetta Fail, MD, 40 mg at 11/10/20 1938   gabapentin (NEURONTIN) capsule 300 mg, 300 mg, Oral, TID, Synetta Fail, MD, 300 mg at 11/11/20 7425   hydrOXYzine (ATARAX/VISTARIL) tablet 25 mg, 25 mg, Oral, Q6H PRN, Synetta Fail, MD, 25 mg at 11/10/20 2055   polyethylene glycol (MIRALAX / GLYCOLAX) packet 17 g, 17 g, Oral, Daily PRN, Synetta Fail, MD   sodium chloride flush (NS) 0.9 % injection 3 mL, 3 mL, Intravenous, Q12H, Synetta Fail, MD, 3 mL at 11/11/20 9563   vitamin B-12 (CYANOCOBALAMIN) tablet 1,000 mcg, 1,000 mcg, Oral, Daily, Lonia Blood, MD, 1,000 mcg at 11/11/20 8756   PTA meds reviewed with patient:  -Suboxone 2/0.5 po qd -Voltaren gel prn -Atarax 25mg  ii po qhs -B12 po qd - Doxepin 10mg  po whs -Cymbalta 30mg  po tid -Neurontin 300mg  po tid -Glycerin suppository PR prn.   Exam: Current vital signs: BP 118/76 (BP Location: Left Arm)   Pulse 82   Temp 98.1 F (36.7 C) (Oral)   Resp 20   Ht 5\' 11"  (1.803 m)   Wt 77.7 kg   SpO2 94%   BMI 23.89 kg/m  Vital signs in last 24 hours: Temp:  [97.4 F (36.3 C)-98.1 F (36.7 C)] 98.1 F (36.7 C) (08/29 0722) Pulse Rate:  [70-87] 82 (08/29 0722) Resp:  [12-20] 20 (08/29 0722) BP: (93-118)/(53-90) 118/76 (08/29 0722) SpO2:  [93 %-99 %] 94 % (08/29 0722) Weight:  [77.7 kg] 77.7 kg (08/29 0448)  PE: GENERAL: Well appearing male. Awake, alert in NAD.  HEENT: normocephalic and atraumatic. LUNGS - Normal respiratory  effort.  CV - RRR.  ABDOMEN - Flat, soft, nontender. Ext: warm, well perfused. Psych: affect light but concerned.   NEURO:  NP obtained permission from patient to video his spells.  Mental Status: Alert and oriented x 4.  Speech/Language: speech is without dysarthria or aphasia.  Naming, repetition, fluency, and comprehension intact. Can spell WORLD backwards.   Cranial Nerves:  II: PERRL 35mm/brisk. visual fields full.  III, IV, VI: EOMI. Lid elevation symmetric and full.  V: sensation is intact and symmetrical to face.  VII: Smile is symmetrical. Able to puff cheeks and raise eyebrows.  VIII:hearing intact to voice. IX, X: palate elevation is symmetric. Phonation normal.  XI: normal sternocleidomastoid and trapezius muscle strength. 04-28-2002 is symmetrical without fasciculations.   Motor:  Strength is 5/5 to all muscle groups  tested.  Tone is normal. Bulk is normal.  Sensation- Intact to light touch bilaterally in all four extremities. Extinction absent to DSS. Cold is decreased in right LE below knee.  Coordination: FTN intact bilaterally. HKS intact bilaterally. No pronator drift.  DTRs:  RUE:  biceps 2      brachioradialis 2     triceps 2 RLE:  patella 2    tibial 2  (left knee reacts to right patella) LUE:  IV at biceps. Brachioradialis 3     triceps 2 LLE: patella  2   tibial 2 Gait- deferred.  Labs I have reviewed labs in epic and the results pertinent to this consultation are:  CBC    Component Value Date/Time   WBC 3.8 (L) 11/11/2020 0257   RBC 3.72 (L) 11/11/2020 0257   RBC 3.73 (L) 11/11/2020 0257   HGB 12.0 (L) 11/11/2020 0257   HCT 36.4 (L) 11/11/2020 0257   PLT 151 11/11/2020 0257   MCV 97.6 11/11/2020 0257   MCH 32.2 11/11/2020 0257   MCHC 33.0 11/11/2020 0257   RDW 13.3 11/11/2020 0257   LYMPHSABS 1.1 11/09/2020 1230   MONOABS 0.6 11/09/2020 1230   EOSABS 0.2 11/09/2020 1230   BASOSABS 0.0 11/09/2020 1230    CMP     Component Value Date/Time    NA 138 11/11/2020 0257   NA 142 10/29/2020 1557   K 3.9 11/11/2020 0257   CL 101 11/11/2020 0257   CO2 31 11/11/2020 0257   GLUCOSE 93 11/11/2020 0257   BUN 17 11/11/2020 0257   BUN 16 10/29/2020 1557   CREATININE 0.78 11/11/2020 0257   CALCIUM 8.5 (L) 11/11/2020 0257   PROT 6.1 (L) 11/11/2020 0257   ALBUMIN 3.2 (L) 11/11/2020 0257   AST 32 11/11/2020 0257   ALT 18 11/11/2020 0257   ALKPHOS 114 11/11/2020 0257   BILITOT 1.2 11/11/2020 0257   GFRNONAA >60 11/11/2020 0257    Lipid Panel     Component Value Date/Time   CHOL 108 10/02/2020 0301   TRIG 27 10/02/2020 0301   HDL 48 10/02/2020 0301   CHOLHDL 2.3 10/02/2020 0301   VLDL 5 10/02/2020 0301   LDLCALC 55 10/02/2020 0301    Imaging MD reviewed the images obtained.  CT head 11/10/20 Generalized cerebral atrophy. No acute intracranial abnormality  MRI brain 10/04/20 No acute intracranial abnormality. Mild chronic small vessel ischemic disease.   EEG 11/10/20 This study is suggestive of mild diffuse encephalopathy, nonspecific etiology. No seizures or epileptiform discharges were seen throughout the recording.  Multiple episodes of brief whole body jerks and bilateral lower extremity jerks were noted without concomitant EEG change. These episodes were NOT epileptic.    Assessment: 75 yo male presenting with more frequent shaking spells that he presented with in July, 2022. He tried Keppra from neurologist at the [facility], but it did not decrease frequency or intensity of spells. He was hospitalized here for basically the same thing one month ago. These spells, do not appear to be seizure-like, and EEG had no seizure correlation with his spells. It seems he has had a life stressor coming to grips with his marriage and likely should have out patient psychiatric evaluation or at least psychotherapy. Do not see any reason to repeat his MRI brain, as it will likely be low yield and it was essentially negative in July 2022. Although  suspicion for most of these events being epileptic is low, he does report some lethargy and disorientation after them  rarely. I have asked him to keep a careful log of his spells over the next 24 hrs which we will monitor with both video and continuous EEG to better characterize them.  Impression: -spells. -Long term use of narcotics.  -Depression, PTSD.  -Acute stressors.   Recommendations/Plan:  -supportive care.  -cEEG overnight.   - D/c gabapentin -f/up with neurology at [facility].  -Continue not to drive.  -Psychiatry evaluation is pending  Pt seen by Jimmye Norman, NP/Neuro and later by MD. Note/plan to be edited by MD as needed.  Pager: [phone]  Neurology Attending Attestation   I examined the patient and discussed plan with Ms. Tereso Newcomer NP. Above note has been edited by me to reflect my findings and recommendations.    Bing Neighbors, MD [facility] [phone]   If 7pm- 7am, please page neurology on call as listed in AMION.

## 2020-11-12 ENCOUNTER — Ambulatory Visit (HOSPITAL_COMMUNITY): Payer: No Typology Code available for payment source

## 2020-11-12 ENCOUNTER — Encounter (HOSPITAL_COMMUNITY): Payer: Self-pay

## 2020-11-12 DIAGNOSIS — R4689 Other symptoms and signs involving appearance and behavior: Secondary | ICD-10-CM

## 2020-11-12 DIAGNOSIS — F330 Major depressive disorder, recurrent, mild: Secondary | ICD-10-CM | POA: Diagnosis not present

## 2020-11-12 DIAGNOSIS — R443 Hallucinations, unspecified: Secondary | ICD-10-CM | POA: Diagnosis not present

## 2020-11-12 LAB — CBC
HCT: 36.8 % — ABNORMAL LOW (ref 39.0–52.0)
Hemoglobin: 12 g/dL — ABNORMAL LOW (ref 13.0–17.0)
MCH: 32.1 pg (ref 26.0–34.0)
MCHC: 32.6 g/dL (ref 30.0–36.0)
MCV: 98.4 fL (ref 80.0–100.0)
Platelets: 155 10*3/uL (ref 150–400)
RBC: 3.74 MIL/uL — ABNORMAL LOW (ref 4.22–5.81)
RDW: 13.4 % (ref 11.5–15.5)
WBC: 4.6 10*3/uL (ref 4.0–10.5)
nRBC: 0 % (ref 0.0–0.2)

## 2020-11-12 MED ORDER — HYDROXYZINE HCL 10 MG PO TABS
10.0000 mg | ORAL_TABLET | Freq: Three times a day (TID) | ORAL | 0 refills | Status: DC | PRN
Start: 2020-11-12 — End: 2021-07-29

## 2020-11-12 MED ORDER — DULOXETINE HCL 30 MG PO CPEP: 30.0000 mg | ORAL_CAPSULE | Freq: Two times a day (BID) | ORAL | 0 refills | Status: DC

## 2020-11-12 MED ORDER — DULOXETINE HCL 30 MG PO CPEP
30.0000 mg | ORAL_CAPSULE | Freq: Two times a day (BID) | ORAL | Status: DC
  Administered 2020-11-12: 30 mg via ORAL
  Filled 2020-11-12: qty 1

## 2020-11-12 NOTE — Progress Notes (Signed)
D/C instructions reviewed with pt and wife, IV removed, pressure dressing applied, no more bleeding noted, wife took pt downstairs per her request. No distress noted.

## 2020-11-12 NOTE — Discharge Instructions (Signed)

## 2020-11-12 NOTE — Discharge Summary (Signed)
Physician Discharge Summary  Lise AuerJerry Kaczynski WUJ:[id number] DOB: 04/06/1945  [facility]  Admitted from: Home Discharged to: Home  Admit date: 11/09/2020 Discharge date: 11/12/2020  Recommendations for Outpatient Follow-up:    Follow-up Information     [facility] Schedule an appointment as soon as possible for a visit in 1 week(s).   Why: To be seen with repeat labs (CBC & BMP). Contact information: [address] [place] [address] [phone]         Lanier PrudeLambert, Cameron T, MD .   Specialties: Cardiology, Radiology Contact information: [address] [place] [address] [phone]         Verdon Cumminsohen, David A, MD. Schedule an appointment as soon as possible for a visit.   Specialty: Specialist Contact information: [address] [place] [address] [phone]                Re[facility] ~ [facility] weeks. This can be coordinated by his PCP at the [facility].  Home Health: None.  Spouse indicated that patient already has physical therapy coming home for evaluation and management.  They did not want to wait for PT evaluation until tomorrow    Equipment/Devices: None.  Patient states that he ambulates with a Rollator at home    Discharge Condition: Improved and stable   Code Status: DNR Diet recommendation:  Discharge Diet Orders (From admission, onward)     Start     Ordered   11/12/20 0000  Diet - low sodium heart healthy        11/12/20 1825             Discharge Diagnoses:  Principal Problem:   AMS (altered mental status) Active Problems:   AF (paroxysmal atrial fibrillation) ([name])   Major depressive disorder   OSA on CPAP   Episode of shaking   Hallucination   Spell of abnormal behavior   Brief Summary: 75 year old with a history of atrial fibrillation, depression, PTSD, celiac disease, chronic pain, status post gastric bypass, OSA, GERD, and  pacemaker placement who presented to the ER with increasing "tremors" at home.  The patient was admitted in July 2020 with similar symptoms and evaluated for possible seizure with a negative EEG and unremarkable MRI.  Though he was not started on medication at the time Keppra was initiated through the [facility] in a subsequent outpatient follow-up.  The patient reports ongoing episodes of tremors several times a day every day since that time.  They are described as bilateral shaking of the arms and legs without alteration in consciousness lasting minutes at most.  One of his episodes happened while he was in the ED and the patient remained conscious and was talking during the episode.  His wife also reports intermittent hallucinations with the patient talking to people who are not there and at times slurring his speech or using nonsensical speech.   Intermittent jerky involuntary movements of entire body, mostly below the trunk, spells lasting from a few seconds to 30 to 60 seconds, associated sometimes with visual hallucinations/dysarthria/gibberish speech/odor/headache and leaves him fatigued.  Keppra has not helped-started 3 weeks ago.  Neurology and psychiatry consulted 8/29  Assessment & Plan:   Involuntary movements, intermittently associated with visual hallucinations/dysarthria/gibberish speech/odor/headache -Etiology unclear -description not suggestive of generalized seizure -recent normal brain MRI -CT head this admission reveals only generalized brain atrophy  -No significant metabolic etiologies noted -Psychiatry input appreciated.  Recommended  discontinuing gabapentin and restarting Cymbalta 30 mg twice daily for depression and anxiety along with neuropathic pain, continuing home hydroxyzine for anxiety and insomnia and follow-up at the [facility]. -Neurology input appreciated: Although suspicion for epilepsy is low, since he reported some lethargy and disorientation after them, he was monitored  with continuous EEG to better characterize.  Discontinued gabapentin, follow-up with Neurology at [facility] and patient does not drive. -UDS negative. -Patient and spouse also wish to follow-up with [facility]> Ambulatory referral sent via [facility]. -As discussed and agreed with Neurology, Dr. Selina Cooley: Discontinued Keppra & Gabapentin, changed Cymbalta to 30 mg twice daily, Atarax 10 mg 3 times daily as needed for anxiety and involuntary movements and cleared for discharge home. rEEG and cEEG with video both negative.   Chronic atrial fibrillation Rate controlled -does not appear to be on long-term anticoagulation. I received a call on 8/29 stating the patient has an upcoming left atrial appendage closure procedure and he was scheduled for a Cardiac CT the same day while he was hospitalized and his Cardiac team and the patient/family wished to get it done while hospitalized and not have to get it postponed.   Normocytic anemia/Iron Deficiency Stable.  Leukopenia has resolved.   OSA Continue usual home CPAP   Chronic pain and neuropathy Continue usual Suboxone and Cymbalta. Has not changed doses in several years.  As outpatient, trying to gradually wean off Suboxone.  Gabapentin discontinued this admission.  After couple of weeks if here is no improvement in his involuntary movements, may consider resuming Gabapentin at low-dose as outpatient.   Celiac disease/iron deficiency Checked vitamin levels due to possibility of malabsorption due to Gastric Bypass history.  B12: 1916, folate 18.9, ferritin 37.  Recommend OP further evaluation for etiology of his iron deficiency and starting iron supplements as outpatient. Also consider reducing the dose of B12 supplements during OP follow up.   Major depressive disorder/PTSD/anxiety As per psychiatry input, patient's timeline of taking gabapentin and onset of mild tremors align.  Side effect of gabapentin include tremors, headaches, hallucinations,  dry mouth and dizziness follow-up which he was experiencing.  There seems to be no psychogenic cause of his tremors as his anxiety is well controlled, and he has had minimal stressors. They recommended Dc'ing Gabapentin, restarting Cymbalta at 30 mg BID for anxiety/depression and neuropathy, continue Hydroxyzine for anxiety/insomnia and outpatient follow up in the [place].   Consultations: Neurology Psychiatry  Procedures: rEEG and c EEG with video    Discharge Instructions  Discharge Instructions     Ambulatory referral to Neurology   Complete by: As directed    An appointment is requested in approximately: 2 weeks.  Please call patient's spouse Mrs. Mila Homer to schedule appointment at [phone].   Call MD for:   Complete by: As directed    Recurrent involuntary movements.   Diet - low sodium heart healthy   Complete by: As directed    Driving Restrictions   Complete by: As directed    No driving for 6 months or until cleared by your outpatient providers during office visit.   Increase activity slowly   Complete by: As directed         Medication List     STOP taking these medications    gabapentin 300 MG capsule Commonly known as: NEURONTIN   levETIRAcetam 250 MG Tb3d       TAKE these medications    ascorbic acid 1000 MG tablet Commonly known as: VITAMIN  C Take 1 tablet by mouth daily.   beta carotene 96045 UNIT capsule Take 1 tablet by mouth daily.   buprenorphine-naloxone 2-0.5 mg Subl SL tablet Commonly known as: SUBOXONE Place 1 tablet under the tongue daily.   camphor-menthol lotion Commonly known as: SARNA Apply 1 application topically daily as needed for itching.   cyanocobalamin 1000 MCG tablet Take 1,000 mcg by mouth daily.   diclofenac Sodium 1 % Gel Commonly known as: VOLTAREN Apply 2 g topically daily. For feet pain   doxepin 10 MG capsule Commonly known as: SINEQUAN Take 10 mg by mouth at bedtime.   DULoxetine 30 MG  capsule Commonly known as: CYMBALTA Take 1 capsule (30 mg total) by mouth 2 (two) times daily. What changed: when to take this   ferrous sulfate 325 (65 FE) MG tablet Take 325 mg by mouth See admin instructions. Take one tablet by mouth on Monday Wednesdays and Fridays per patient   hydrOXYzine 10 MG tablet Commonly known as: ATARAX/VISTARIL Take 1 tablet (10 mg total) by mouth 3 (three) times daily as needed for anxiety (Severe jerking/tremors.). What changed:  how much to take when to take this reasons to take this   lidocaine 5 % Commonly known as: LIDODERM Place 1 patch onto the skin daily as needed (For feet pain per patient).   lipase/protease/amylase 12000-38000 units Cpep capsule Commonly known as: CREON Take 36,000 Units by mouth 3 (three) times daily with meals.   loperamide 1 MG/5ML solution Commonly known as: IMODIUM Take 5 mLs by mouth daily as needed for diarrhea or loose stools.   pyridoxine 100 MG tablet Commonly known as: B-6 Take 1 tablet by mouth daily.       Allergies  Allergen Reactions   Amphetamine Rash and Other (See Comments)    Altered mental status- amnesia (also)   Diazepam Rash    Other reaction(s): Amnesia, Mental Status Changes (intolerance), Other amnesia    Zoloft [Sertraline] Anxiety, Rash and Other (See Comments)    Panic attacks and Cardiovascular Arrest (ALSO)    Gluten Meal Diarrhea   Penicillin G Rash      Procedures/Studies: CT HEAD WO CONTRAST ( )  Result Date: 11/09/2020 CLINICAL DATA:  Altered mental status. EXAM: CT HEAD WITHOUT CONTRAST TECHNIQUE: Contiguous axial images were obtained from the base of the skull through the vertex without intravenous contrast. COMPARISON:  October 01, 2020 FINDINGS: Brain: There is mild cerebral atrophy with widening of the extra-axial spaces and ventricular dilatation. There are areas of decreased attenuation within the white matter tracts of the supratentorial brain, consistent with  microvascular disease changes. Vascular: No hyperdense vessel or unexpected calcification. Skull: Negative for an acute fracture. Stable areas of cortical thinning are seen within the posterior parietal regions, bilaterally. Sinuses/Orbits: No acute finding. Other: None. IMPRESSION: 1. Generalized cerebral atrophy. 2. No acute intracranial abnormality. Electronically Signed   By: Aram Candela M.D.   On: 11/09/2020 19:35   CT CARDIAC MORPH/PULM VEIN W/CM&W/O CA SCORE  Addendum Date: 11/12/2020   ADDENDUM REPORT: 11/12/2020 18:07 CLINICAL DATA:  Atrial fibrillation scheduled for left atrial appendage closure. EXAM: Cardiac CT/CTA TECHNIQUE: A non-contrast, gated CT scan was obtained with axial slices of 3 mm through the heart for calcium scoring. Calcium scoring was performed using the Agatston method. A 120 kV retrospective, gated, contrast cardiac scan was obtained. Gantry rotation speed was 250 msecs and collimation was 0.6 mm. Nitroglycerin was not given. A delayed scan was obtained to exclude left atrial appendage  thrombus. The 3D dataset was reconstructed in 5% intervals of the 25-50% of the R-R cycle. Late systolic phases were analyzed on a dedicated workstation using MPR, MIP, and VRT modes. The patient received 100 cc of contrast. FINDINGS: Left Atrium: The left atrial size is severely dilated (LAVI = 127.64 mL/m2). 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 There is normal pulmonary vein drainage into the left atrium (2 on the right and 2 on the left). Left Atrial Appendage: Morphology: The left atrial appendage appears windsock in morphology. Thrombus: There is systolic filling defect in the left atrial appendage that is not present in delayed sequence. This is not suggestive of thrombus. The following measurements were made regarding left atrial appendage closure: Phase assessed: 45% Landing Zone measurement: 25 mm X 22 mm. LAA Length (maximum): 27 mm Optimal interatrial septum puncture site: Mid-posterior is  suggested. Presence of small PFO may make mid stick more challenging. Optimal deployment angle: RAO 32 CRA 22 Catheter: A double curve catheter is recommended. Watchman FLX Device: A 31 mm device is recommended with 20% compression. Right Atrium: There is a right atrial lead that terminates in the right atrial appendage. The right atrium is severely dilated. Right Ventricle: There is a right ventricular lead that terminates in the right ventricular apex. The right ventricle appears mildly dilated. Left Ventricle: The ventricular cavity size is within normal limits. There are no stigmata of prior infarction. There is no abnormal filling defect. Pulmonary Artery: Normal caliber without proximal filling defect. Cardiac valves: The aortic valve is tri-leaflet without significant calcification. The mitral valve is normal structure without significant calcification. Aorta: Normal caliber with no significant disease. Pericardium: Normal thickness with no significant effusion or calcium present. Coronary Calcium Score: Left main: 0 Left anterior descending artery: 595 Left circumflex artery: 140 Right coronary artery: 26 Total: 761 Percentile: 77th for age, sex, and race matched control. IMPRESSION: 1. The left atrial appendage appears windsock in morphology. There is systolic filling defect in the left atrial appendage that is not present in delayed sequence. This is not suggestive of thrombus. 2. A 31 mm watchman FLX device is recommended based on the above landing zone measurements (25 mm maximum diameter; 20 % compression). 3. An mid-posterior IAS puncture site is recommended. 4. Optimal deployment angle: RAO 32 CRA 22 5. Normal coronary origin. Left dominance. 6. Coronary artery calcium score 761, 77th for age, sex, and race matched control. Riley Lam MD Electronically Signed   By: Riley Lam M.D.   On: 11/12/2020 18:07   Result Date: 11/12/2020 EXAM: OVER-READ INTERPRETATION  CT CHEST The  following report is an over-read performed by radiologist Dr. Genevive Bi of [facility] on 11/11/2020. This over-read does not include interpretation of cardiac or coronary anatomy or pathology. The coronary CTA interpretation by the cardiologist is attached. COMPARISON:  None. FINDINGS: Limited view of the lung parenchyma demonstrates no suspicious nodularity. Airways are normal. Limited view of the mediastinum demonstrates no adenopathy. Esophagus normal. Limited view of the upper abdomen unremarkable. Limited view of the skeleton and chest wall is unremarkable. IMPRESSION: No significant extracardiac findings Electronically Signed: By: Genevive Bi M.D. On: 11/11/2020 16:09   DG Chest Portable 1 View  Result Date: 11/09/2020 CLINICAL DATA:  infection evaluation EXAM: PORTABLE CHEST 1 VIEW COMPARISON:  None. FINDINGS: The cardiomediastinal silhouette is enlarged in contour.LEFT chest cardiac pacing device. No pleural effusion. No pneumothorax. Linear opacities along the RIGHT perihilar border, most consistent with scar/atelectasis. Linear opacities at the  LEFT lung base, likely scar/atelectasis. Visualized abdomen is unremarkable. IMPRESSION: Bilateral atelectasis. Electronically Signed   By: Meda Klinefelter M.D.   On: 11/09/2020 15:16   EEG adult  Result Date: 11/10/2020 Charlsie Quest, MD     11/10/2020 11:24 AM Patient Name: Augusto Deckman MRN: [id number] Epilepsy Attending: Charlsie Quest Referring Physician/Provider: Dr Beola Cord Date: 11/10/2020 Duration: 26.19 mins Patient history: 74yo M with ams and worsening tremors. EEG to evaluate for seizure Level of alertness: Awake AEDs during EEG study: None Technical aspects: This EEG study was done with scalp electrodes positioned according to the 10-20 International system of electrode placement. Electrical activity was acquired at a sampling rate of 500Hz  and reviewed with a high frequency filter of 70Hz  and a low frequency  filter of 1Hz . EEG data were recorded continuously and digitally stored. Description: The posterior dominant rhythm consists of 9Hz  activity of moderate voltage (25-35 uV) seen predominantly in posterior head regions, symmetric and reactive to eye opening and eye closing. EEG showed intermittent generalized 3 to 6 Hz theta-delta slowing. Multiple episodes of brief whole body jerks and bilateral lower extremity jerks were noted. Concomitant EEG before, during and after the event did not show any EEG changes suggest seizure. Physiologic photic driving was not seen during photic stimulation.  Hyperventilation was not performed.   ABNORMALITY - Intermittent slow, generalized IMPRESSION: This study is suggestive of mild diffuse encephalopathy, nonspecific etiology. No seizures or epileptiform discharges were seen throughout the recording. Multiple episodes of brief whole body jerks and bilateral lower extremity jerks were noted without concomitant EEG change. These episodes were NOT epileptic. Priyanka   Overnight EEG with video  Result Date: 11/12/2020 , MD     11/12/2020  9:06 AM Patient Name: Rollins Wrightson MRN: 11/14/2020 Epilepsy Attending: Charlsie Quest Referring Physician/Provider: 11/14/2020, NP Duration: 11/11/2020 2028 to 11/12/2020 0900  Patient history: 74yo M with ams and worsening tremors. EEG to evaluate for seizure  Level of alertness: Awake, asleep  AEDs during EEG study: None  Technical aspects: This EEG study was done with scalp electrodes positioned according to the 10-20 International system of electrode placement. Electrical activity was acquired at a sampling rate of 500Hz  and reviewed with a high frequency filter of 70Hz  and a low frequency filter of 1Hz . EEG data were recorded continuously and digitally stored.  Description: The posterior dominant rhythm consists of 9Hz  activity of moderate voltage (25-35 uV) seen predominantly in posterior head regions, symmetric  and reactive to eye opening and eye closing. Sleep was characterized by sleep spindles (12-14hz ), maximal frontocentral region. EEG showed intermittent generalized 3 to 6 Hz theta-delta slowing.  Over 40 episodes of brief whole body jerks were noted. Concomitant EEG before, during and after the event did not show any EEG changes suggest seizure.  Physiologic photic driving was not seen during photic stimulation.  Hyperventilation was not performed.    ABNORMALITY - Intermittent slow, generalized  IMPRESSION: This study is suggestive of mild diffuse encephalopathy, nonspecific etiology. No seizures or epileptiform discharges were seen throughout the recording.  Over 40 episodes of brief whole body jerks were noted without concomitant EEG change. These episodes were NOT epileptic.  Priyanka Jimmye Norman      Subjective: Ongoing intermittent involuntary jerky movements since yesterday, longest was 10 seconds.  Discharge Exam:  Vitals:   11/11/20 1637 11/11/20 2325 11/12/20 0417 11/12/20 1104  BP: 124/82 92/62 101/61 101/70  Pulse: 82 72 70 80  Resp:  Temp: (!) 97.4 F (36.3 C) 98 F (36.7 C) 98 F (36.7 C) 97.7 F (36.5 C)  TempSrc: Oral Oral Oral Oral  SpO2: 100% 96% 95% 96%  Weight:   78.2 kg   Height:        General exam: Elderly male, moderately built and nourished sitting up comfortably in bed without distress. Respiratory system: Clear to auscultation.  No increased work of breathing. Cardiovascular system: S1 and S2 heard, RRR.  No JVD, murmurs or pedal edema. Gastrointestinal system: Abdomen is nondistended, soft and nontender. No organomegaly or masses felt. Normal bowel sounds heard. Central nervous system: Alert and oriented. No focal neurological deficits.  No involuntary movements noted during my visit today with patient for over 15 minutes. Extremities: Symmetric 5 x 5 power. Skin: No rashes, lesions or ulcers Psychiatry: Judgement and insight appear normal. Mood &  affect appropriate.     The results of significant diagnostics from this hospitalization (including imaging, microbiology, ancillary and laboratory) are listed below for reference.     Microbiology: Recent Results (from the past 240 hour(s))  Resp Panel by RT-PCR (Flu A&B, Covid) Nasopharyngeal Swab     Status: None   Collection Time: 11/09/20  7:04 PM   Specimen: Nasopharyngeal Swab; Nasopharyngeal(NP) swabs in vial transport medium  Result Value Ref Range Status   SARS Coronavirus 2 by RT PCR NEGATIVE NEGATIVE Final    Comment: (NOTE) SARS-CoV-2 target nucleic acids are NOT DETECTED.  The SARS-CoV-2 RNA is generally detectable in upper respiratory specimens during the acute phase of infection. The lowest concentration of SARS-CoV-2 viral copies this assay can detect is 138 copies/mL. A negative result does not preclude SARS-Cov-2 infection and should not be used as the sole basis for treatment or other patient management decisions. A negative result may occur with  improper specimen collection/handling, submission of specimen other than nasopharyngeal swab, presence of viral mutation(s) within the areas targeted by this assay, and inadequate number of viral copies(<138 copies/mL). A negative result must be combined with clinical observations, patient history, and epidemiological information. The expected result is Negative.  Fact Sheet for Patients:  BloggerCourse.com  Fact Sheet for Healthcare Providers:  SeriousBroker.it  This test is no t yet approved or cleared by the [place] FDA and  has been authorized for detection and/or diagnosis of SARS-CoV-2 by FDA under an Emergency Use Authorization (EUA). This EUA will remain  in effect (meaning this test can be used) for the duration of the COVID-19 declaration under Section 564(b)(1) of the Act, 21 U.S.C.section 360bbb-3(b)(1), unless the authorization is terminated  or  revoked sooner.       Influenza A by PCR NEGATIVE NEGATIVE Final   Influenza B by PCR NEGATIVE NEGATIVE Final    Comment: (NOTE) The Xpert Xpress SARS-CoV-2/FLU/RSV plus assay is intended as an aid in the diagnosis of influenza from Nasopharyngeal swab specimens and should not be used as a sole basis for treatment. Nasal washings and aspirates are unacceptable for Xpert Xpress SARS-CoV-2/FLU/RSV testing.  Fact Sheet for Patients: BloggerCourse.com  Fact Sheet for Healthcare Providers: SeriousBroker.it  This test is not yet approved or cleared by the [place] FDA and has been authorized for detection and/or diagnosis of SARS-CoV-2 by FDA under an Emergency Use Authorization (EUA). This EUA will remain in effect (meaning this test can be used) for the duration of the COVID-19 declaration under Section 564(b)(1) of the Act, 21 U.S.C. section 360bbb-3(b)(1), unless the authorization is terminated  or revoked.  Performed at [facility], [address] [place], [place] [address]      Labs: CBC: Recent Labs  Lab 11/09/20 1230 11/10/20 0500 11/11/20 0257 11/12/20 0258  WBC 4.5 3.5* 3.8* 4.6  NEUTROABS 2.5  --   --   --   HGB 12.5* 11.8* 12.0* 12.0*  HCT 38.3* 35.9* 36.4* 36.8*  MCV 99.5 98.1 97.6 98.4  PLT 174 152 151 155    Basic Metabolic Panel: Recent Labs  Lab 11/09/20 1230 11/10/20 0500 11/11/20 0257  NA 138 138 138  K 4.4 3.8 3.9  CL 99 99 101  CO2 GLUCOSE 94 96 93  BUN CREATININE 0.78 0.86 0.78  CALCIUM 9.0 8.5* 8.5*  MG 1.9  --   --   PHOS  --   --  4.7*    Liver Function Tests: Recent Labs  Lab 11/10/20 0500 11/11/20 0257  AST 33 32  ALT 20 18  ALKPHOS 126 114  BILITOT 1.2 1.2  PROT 6.1* 6.1*  ALBUMIN 3.3* 3.2*     Anemia work up Recent Labs    11/11/20 0257  VITAMINB12 1,916*  FOLATE 18.9  FERRITIN 37  TIBC 295  IRON 65  RETICCTPCT 0.9     Urinalysis    Component Value Date/Time   COLORURINE YELLOW 11/09/2020 1547   APPEARANCEUR CLEAR 11/09/2020 1547   LABSPEC 1.004 (L) 11/09/2020 1547   PHURINE 7.0 11/09/2020 1547   GLUCOSEU NEGATIVE 11/09/2020 1547   HGBUR NEGATIVE 11/09/2020 1547   BILIRUBINUR NEGATIVE 11/09/2020 1547   KETONESUR NEGATIVE 11/09/2020 1547   PROTEINUR NEGATIVE 11/09/2020 1547   NITRITE NEGATIVE 11/09/2020 1547   LEUKOCYTESUR NEGATIVE 11/09/2020 1547    Discussed in detail with patient's spouse this morning.  Time coordinating discharge: 35 minutes  SIGNED:  Marcellus Scott, MD, FACP, [facility]. [facility]  To contact the attending provider between 7A-7P or the covering provider during after hours 7P-7A, please log into the web site [email or web address] and access using universal [place] password for that web site. If you do not have the password, please call the hospital operator.

## 2020-11-12 NOTE — Progress Notes (Signed)
Jack Neal  EHU:[id number] DOB: [birth year] DOA: 11/09/2020 PCP: [facility]    Brief Narrative:  75 year old with a history of atrial fibrillation, depression, PTSD, celiac disease, chronic pain, status post gastric bypass, OSA, GERD, and pacemaker placement who presented to the ER with increasing "tremors" at home.  The patient was admitted in July 2020 with similar symptoms and evaluated for possible seizure with a negative EEG and unremarkable MRI.  Though he was not started on medication at the time Keppra was initiated through the [place] in a subsequent outpatient follow-up.  The patient reports ongoing episodes of tremors several times a day every day since that time.  They are described as bilateral shaking of the arms and legs without alteration in consciousness lasting minutes at most.  One of his episodes happened while he was in the ED and the patient remained conscious and was talking during the episode.  His wife also reports intermittent hallucinations with the patient talking to people who are not there and at times slurring his speech or using nonsensical speech.  Intermittent jerky involuntary movements of entire body, mostly below the trunk, spells lasting from a few seconds to 30 to 60 seconds, associated sometimes with visual hallucinations/dysarthria/gibberish speech/odor/headache and leaves him fatigued.  Keppra has not helped-started 3 weeks ago.  Neurology and psychiatry consulted 8/29  Significant Events:  8/27 admit via ED 8/28 EEG -no seizure activity noted  Consultants:  Psychiatry Neurology  Code Status: NO CODE BLUE  Antimicrobials:  None  DVT prophylaxis: Lovenox  Subjective: Seen this morning.  Reported continued few episodes of involuntary jerky movements of his body and the longest one was about 10 seconds last night.  No headache or any new symptoms.  Had LTM leads on his scalp.  Assessment & Plan:  Involuntary movements, intermittently  associated with visual hallucinations/dysarthria/gibberish speech/odor/headache -Unclear etiology.  DD: Myoclonus/focal seizures versus others. -Etiology unclear -description not suggestive of generalized seizure -recent normal brain MRI -CT head this admission reveals only generalized brain atrophy  -No significant metabolic etiologies noted -Psychiatry input appreciated.  Recommended discontinuing gabapentin and restarting Cymbalta 30 mg twice daily for depression and anxiety along with neuropathic pain, continuing home hydroxyzine for anxiety and insomnia and follow-up at the [facility]. -Neurology input appreciated: Although suspicion for for epilepsy is low, since he reported some lethargy and disorientation after them, being monitored with both video and continuous EEG to better characterize.  Discontinued gabapentin, follow-up with neurology at [facility] and patient does not drive. -UDS negative. -Patient and spouse also wish to follow-up with [facility].  Chronic atrial fibrillation Rate controlled -does not appear to be on long-term anticoagulation  Normocytic anemia Stable.  Leukopenia has resolved.  OSA Continue usual home CPAP  Chronic pain and neuropathy Continue usual Suboxone and Cymbalta.  Gabapentin discontinued.  Has not changed doses in several years.  Trying to wean off Suboxone  Celiac disease/iron deficiency Checking vitamin levels due to possibility of malabsorption.  B12: 1916, folate 18.9, ferritin 37.  Consider starting iron supplements as outpatient.  Major depressive disorder/PTSD/anxiety As per psychiatry input, patient's timeline of taking gabapentin and onset of mild tremors align.  Side effect of gabapentin include tremors, headaches, hallucinations, dry mouth and dizziness follow-up which she was experiencing.  There seems to be no psychogenic cause of his tremors as his anxiety is well controlled.   Family Communication: Discussed with  patient spouse in detail on patient's speaker phone while in the room. Status is:  Inpatient    Dispo: The patient is from: Home              Anticipated d/c is to: Home possibly 8/31 pending Neurology clearance (still has cEEG on) and PT evaluation.              Patient currently is not medically stable to d/c.   Difficult to place patient No    Objective:  Vitals:   11/11/20 1637 11/11/20 2325 11/12/20 0417 11/12/20 1104  BP: 124/82 92/62 101/61 101/70  Pulse: 82 72 70 80  Resp: 18 17 14 18   Temp: (!) 97.4 F (36.3 C) 98 F (36.7 C) 98 F (36.7 C) 97.7 F (36.5 C)  TempSrc: Oral Oral Oral Oral  SpO2: 100% 96% 95% 96%  Weight:   78.2 kg   Height:        Examination: General exam: Elderly male, moderately built and nourished sitting up comfortably in bed without distress. Respiratory system: Clear to auscultation.  No increased work of breathing. Cardiovascular system: S1 and S2 heard, RRR.  No JVD, murmurs or pedal edema. Gastrointestinal system: Abdomen is nondistended, soft and nontender. No organomegaly or masses felt. Normal bowel sounds heard. Central nervous system: Alert and oriented. No focal neurological deficits.  No involuntary movements noted during my visit today with patient for over 15 minutes. Extremities: Symmetric 5 x 5 power. Skin: No rashes, lesions or ulcers Psychiatry: Judgement and insight appear normal. Mood & affect appropriate.    CBC: Recent Labs  Lab 11/09/20 1230 11/10/20 0500 11/11/20 0257 11/12/20 0258  WBC 4.5 3.5* 3.8* 4.6  NEUTROABS 2.5  --   --   --   HGB 12.5* 11.8* 12.0* 12.0*  HCT 38.3* 35.9* 36.4* 36.8*  MCV 99.5 98.1 97.6 98.4  PLT 174 152 151 155   Basic Metabolic Panel: Recent Labs  Lab 11/09/20 1230 11/10/20 0500 11/11/20 0257  NA 138 138 138  K 4.4 3.8 3.9  CL 99 99 101  CO2 30 31 31   GLUCOSE 94 96 93  BUN 16 19 17   CREATININE 0.78 0.86 0.78  CALCIUM 9.0 8.5* 8.5*  MG 1.9  --   --   PHOS  --   --  4.7*    GFR: Estimated Creatinine Clearance: 87.6 mL/min (by C-G formula based on SCr of 0.78 mg/dL).  Liver Function Tests: Recent Labs  Lab 11/10/20 0500 11/11/20 0257  AST 33 32  ALT 20 18  ALKPHOS 126 114  BILITOT 1.2 1.2  PROT 6.1* 6.1*  ALBUMIN 3.3* 3.2*    Recent Labs  Lab 11/09/20 1935  AMMONIA 17    HbA1C: Hgb A1c MFr Bld  Date/Time Value Ref Range Status  10/02/2020 03:01 AM 5.0 4.8 - 5.6 % Final    Comment:    (NOTE) Pre diabetes:          5.7%-6.4%  Diabetes:              >6.4%  Glycemic control for   <7.0% adults with diabetes      Recent Results (from the past 240 hour(s))  Resp Panel by RT-PCR (Flu A&B, Covid) Nasopharyngeal Swab     Status: None   Collection Time: 11/09/20  7:04 PM   Specimen: Nasopharyngeal Swab; Nasopharyngeal(NP) swabs in vial transport medium  Result Value Ref Range Status   SARS Coronavirus 2 by RT PCR NEGATIVE NEGATIVE Final    Comment: (NOTE) SARS-CoV-2 target nucleic acids are NOT DETECTED.  The SARS-CoV-2  RNA is generally detectable in upper respiratory specimens during the acute phase of infection. The lowest concentration of SARS-CoV-2 viral copies this assay can detect is 138 copies/mL. A negative result does not preclude SARS-Cov-2 infection and should not be used as the sole basis for treatment or other patient management decisions. A negative result may occur with  improper specimen collection/handling, submission of specimen other than nasopharyngeal swab, presence of viral mutation(s) within the areas targeted by this assay, and inadequate number of viral copies(<138 copies/mL). A negative result must be combined with clinical observations, patient history, and epidemiological information. The expected result is Negative.  Fact Sheet for Patients:  BloggerCourse.com  Fact Sheet for Healthcare Providers:  SeriousBroker.it  This test is no t yet approved or  cleared by the [place] FDA and  has been authorized for detection and/or diagnosis of SARS-CoV-2 by FDA under an Emergency Use Authorization (EUA). This EUA will remain  in effect (meaning this test can be used) for the duration of the COVID-19 declaration under Section 564(b)(1) of the Act, 21 U.S.C.section 360bbb-3(b)(1), unless the authorization is terminated  or revoked sooner.       Influenza A by PCR NEGATIVE NEGATIVE Final   Influenza B by PCR NEGATIVE NEGATIVE Final    Comment: (NOTE) The Xpert Xpress SARS-CoV-2/FLU/RSV plus assay is intended as an aid in the diagnosis of influenza from Nasopharyngeal swab specimens and should not be used as a sole basis for treatment. Nasal washings and aspirates are unacceptable for Xpert Xpress SARS-CoV-2/FLU/RSV testing.  Fact Sheet for Patients: BloggerCourse.com  Fact Sheet for Healthcare Providers: SeriousBroker.it  This test is not yet approved or cleared by the [place] FDA and has been authorized for detection and/or diagnosis of SARS-CoV-2 by FDA under an Emergency Use Authorization (EUA). This EUA will remain in effect (meaning this test can be used) for the duration of the COVID-19 declaration under Section 564(b)(1) of the Act, 21 U.S.C. section 360bbb-3(b)(1), unless the authorization is terminated or revoked.  Performed at [facility], [address], [place], [place] [address]      Scheduled Meds:  buprenorphine-naloxone  1 tablet Sublingual Daily   diclofenac Sodium  2 g Topical Daily   DULoxetine  30 mg Oral BID   enoxaparin (LOVENOX) injection  40 mg Subcutaneous Q24H   sodium chloride flush  3 mL Intravenous Q12H   cyanocobalamin  1,000 mcg Oral Daily      LOS: 1 day   Marcellus Scott, MD, [place], [facility]. [facility]  To contact the attending provider between 7A-7P or the covering provider during after hours 7P-7A, please log into the  web site [email or web address] and access using universal [place] password for that web site. If you do not have the password, please call the hospital operator.

## 2020-11-12 NOTE — Progress Notes (Signed)
Neurology Progress Note  S: Jack Neal has kept a log of his body movements since NP requested yesterday. He says the Hydroxyzine is working for his anxiety, and even better since he is taking it more regularly (was only taking at bedtime at home). He called his wife because he wanted her to hear what the test showed and our plan. Informed them that we have not seen results of overnight EEG and the results will effect our plan. He is not complaining of any other issues.   Psychiatry saw him yesterday in consult. States he had a test per cardiology yesterday.   His log of episodes is as follows:  11/11/20 about a dozen short episodes before lunch.  11/11/20 1300-1400. 3 short ones about 5-10 seconds.  11/11/20 1430. Short, lasting 10 seconds or less while bathing.  11/11/20 2102. 3 second episode only to right thigh.  11/11/20 2124. 4 seconds to left leg.  11/11/20 2125. 10 seconds to left leg, left hip, and left foot.  11/11/20 2126. Left foot, then left thigh, then right rib cage.  11/11/20 2129. Left leg, then both feet.  11/11/20 2132. Bilateral hip joint area, very quick.  11/11/20 2134. Bilateral hip joint area, then left leg lasting 4 secs.  11/11/20 2145 hours.  Awakened by staff followed by tremors and jerking.  Did not record episodes overnight.  11/12/20 0718. Mild quick jerk to left leg.   O: Current vital signs: BP 101/61 (BP Location: Left Arm)   Pulse 70   Temp 98 F (36.7 C) (Oral)   Resp 14   Ht 5\' 11"  (1.803 m)   Wt 78.2 kg   SpO2 95%   BMI 24.03 kg/m  Vital signs in last 24 hours: Temp:  [97.4 F (36.3 C)-98 F (36.7 C)] 98 F (36.7 C) (08/30 0417) Pulse Rate:  [70-82] 70 (08/30 0417) Resp:  [14-20] 14 (08/30 0417) BP: (92-124)/(61-82) 101/61 (08/30 0417) SpO2:  [95 %-100 %] 95 % (08/30 0417) Weight:  [78.2 kg] 78.2 kg (08/30 0417)  GENERAL:Very well appearing male, alert in NAD.  HEENT: Normocephalic and atraumatic. EEG leads on.  LUNGS: Normal respiratory effort.   Ext: warm.  NEURO:  Mental Status: Alert and oriented x 4.   Speech/Language: speech is without aphasia or dysarthria.  Naming, repetition, fluency, and comprehension intact.  Cranial Nerves: PERRL. Eyelids symmetrical. Sensation intact to face and extremities. Hearing intact to voice. No tremor events while NP in the room.   Medications  Current Facility-Administered Medications:    acetaminophen (TYLENOL) tablet 650 mg, 650 mg, Oral, Q6H PRN, 650 mg at 11/11/20 1127 **OR** [DISCONTINUED] acetaminophen (TYLENOL) suppository 650 mg, 650 mg, Rectal, Q6H PRN, 11/13/20, MD   buprenorphine-naloxone (SUBOXONE) 2-0.5 mg per SL tablet 1 tablet, 1 tablet, Sublingual, Daily, Jack Fail, MD, 1 tablet at 11/12/20 11/14/20   diclofenac Sodium (VOLTAREN) 1 % topical gel 2 g, 2 g, Topical, Daily, 2355 T, MD   enoxaparin (LOVENOX) injection 40 mg, 40 mg, Subcutaneous, Q24H, Jack Duhamel, MD, 40 mg at 11/11/20 2007   hydrOXYzine (ATARAX/VISTARIL) tablet 25 mg, 25 mg, Oral, Q6H PRN, 2008, MD, 25 mg at 11/11/20 1130   polyethylene glycol (MIRALAX / GLYCOLAX) packet 17 g, 17 g, Oral, Daily PRN, 11/13/20, MD   sodium chloride flush (NS) 0.9 % injection 3 mL, 3 mL, Intravenous, Q12H, Jack Fail, MD, 3 mL at 11/12/20 0816   vitamin B-12 (CYANOCOBALAMIN) tablet 1,000 mcg,  1,000 mcg, Oral, Daily, Jack Blood, MD, 1,000 mcg at 11/12/20 2831  Pertinent Labs Vitamin B12 level 1916.   No new imaging.   EEG 11/10/20.  This study is suggestive of mild diffuse encephalopathy, nonspecific etiology. No seizures or epileptiform discharges were seen throughout the recording.  Multiple episodes of brief whole body jerks and bilateral lower extremity jerks were noted without concomitant EEG change. These episodes were NOT epileptic.   ADDENDUM: Overnight EEG as follows:  This study is suggestive of mild diffuse encephalopathy, nonspecific  etiology. No seizures or epileptiform discharges were seen throughout the recording.  Over 40 episodes of brief whole body jerks were noted without concomitant EEG change. These episodes were NOT epileptic.   Assessment: 75 yo male presenting with more frequent shaking spells that he presented with in July, 2022. These spells, do not appear to be seizure-like. Overnight EEG shows no EEG changes that correlate with his body jerks.    Impression: -shaking spells of unknown etiology.  -Negative rEEG and cEEG.   Recommendations/Plan:  -Continue to hold Gabapentin even on discharge. F/up with out patient neurology at [facility] 2 weeks to inquire about restart. If his episodes do not slow down of stop after off Gabapentin for 2-4 weeks, he can likely restart at the lowest working dose possible.  -Would continue increased frequency of Atarax on discharge, at least TID. He has enough medication at home.  -Suggest some type of mental health f/up at [facility] after discharge.   Pt seen by Jack Norman, MSN, APN-BC/Nurse Practitioner/Neuro and discussed with Jack Neal who agrees with the plan.   Pager: [phone]

## 2020-11-12 NOTE — Consult Note (Signed)
Psychiatry C/L Face-to-Face Progress Note  Reason for Consult:  hallucinations- possibly of psychogenic etiology Referring Physician:  Jetty Duhamel MD Patient Identification: Jack Neal MRN:  [id number] Principal Diagnosis: AMS (altered mental status) Diagnosis:  Principal Problem:   AMS (altered mental status) Active Problems:   AF (paroxysmal atrial fibrillation) ([name])   Major depressive disorder   OSA on CPAP   Tremor     Total Time spent with patient: 15 minutes   Subjective: Jack Neal is a 75 y.o. male patient admitted with worsening tremors and consulted to psychiatry for hallucinations. Overnight, he was placed on EEG, in which he had "Over 40 episodes of brief whole body jerks were noted. Concomitant EEG before, during and after the event did not show any EEG changes suggest seizure." This morning, he endorses poor sleep but is otherwise doing well. He says that he was told by neurology that his Gabapentin would continue to be held but they would restart his Cymbalta and Hydroxyzine.   Treatment Plan Summary: Jack Neal is a 75 year old male with a psychiatric history of MDD, PTSD, and anxiety and a medical history of gastric bypass surgery consulted to the psychiatric service for hallucination and possible psychogenic tremors.  On assessment, Jack Neal's timeline of taking gabapentin and the onset of mild tremors align.  Side effects of gabapentin  include tremors, headaches, hallucinations, dry mouth, and dizziness, all of which he is experiencing.  There seems to be no psychogenic cause of his tremors, as his anxiety has been well controlled, and he has had minimal stressors. Psychiatry is in agreement with the plan above from neurology.    Recommendations: -Discontinue Gabapentin (300 mg TID).  -Restart Cymbalta 30 mg po BID for anxiety/depression and neuropathy today. -Continue home Hydroxyzine for anxiety/insomnia. -Continue outpatient follow-up at the [facility]  Disposition: Defer  to primary team.  Patient is determined to be psychiatrically stable at this time. Psychiatry will sign off. Please do not hesitate to call back if questions arise. Thank you for this consult.    Lamar Sprinkles, MD PGY-1 11/12/2020 [facility] Department of Psychiatry

## 2020-11-12 NOTE — Progress Notes (Addendum)
PT Cancellation Note  Patient Details Name: Jack Neal MRN: [id number] DOB: Jan 26, 1946   Cancelled Treatment:    Reason Eval/Treat Not Completed: Other (comment) Still on EEG monitoring. Will follow-up for PT Evaluation as schedule permits.  5:25p - pt remains on prolonged EEG monitor, not supposed to get OOB. Will follow-up for PT Evaluation tomorrow as schedule permits.  Ina Homes, PT, DPT Acute Rehabilitation Services  Pager [phone] Office [phone]  Malachy Chamber 11/12/2020, 9:36 AM

## 2020-11-12 NOTE — Progress Notes (Signed)
EEG maintenance performed.  No skin breakdown observed at electrode sites Fp1,Fp2,F7, F8. Patient event button tested.

## 2020-11-12 NOTE — Procedures (Addendum)
Patient Name: Jack Neal  MRN: [id number]  Epilepsy Attending: Charlsie Quest  Referring Physician/Provider: Jimmye Norman, NP Duration: 11/11/2020 2028 to 11/12/2020 1816   Patient history: 74yo M with ams and worsening tremors. EEG to evaluate for seizure   Level of alertness: Awake, asleep   AEDs during EEG study: None   Technical aspects: This EEG study was done with scalp electrodes positioned according to the 10-20 International system of electrode placement. Electrical activity was acquired at a sampling rate of 500Hz  and reviewed with a high frequency filter of 70Hz  and a low frequency filter of 1Hz . EEG data were recorded continuously and digitally stored.    Description: The posterior dominant rhythm consists of 9Hz  activity of moderate voltage (25-35 uV) seen predominantly in posterior head regions, symmetric and reactive to eye opening and eye closing. Sleep was characterized by sleep spindles (12-14hz ), maximal frontocentral region. EEG showed intermittent generalized 3 to 6 Hz theta-delta slowing.   Over 40 episodes of brief whole body jerks were noted. Concomitant EEG before, during and after the event did not show any EEG changes suggest seizure.   ABNORMALITY - Intermittent slow, generalized   IMPRESSION: This study is suggestive of mild diffuse encephalopathy, nonspecific etiology. No seizures or epileptiform discharges were seen throughout the recording.   Over 40 episodes of brief whole body jerks were noted without concomitant EEG change. These episodes were NOT epileptic.    Jelissa Espiritu 

## 2020-11-13 ENCOUNTER — Encounter: Payer: Self-pay | Admitting: Student

## 2020-11-13 LAB — LEVETIRACETAM LEVEL: Levetiracetam Lvl: 7.4 ug/mL — ABNORMAL LOW (ref 10.0–40.0)

## 2020-11-13 NOTE — Progress Notes (Signed)
LTM was stopped by nurse for Pt discharge; all leads removed by nurse. On call tech advised nurse the EEG machine will be picked up in the morning.

## 2020-11-14 LAB — VITAMIN B1: Vitamin B1 (Thiamine): 222.8 nmol/L — ABNORMAL HIGH (ref 66.5–200.0)

## 2020-11-20 ENCOUNTER — Telehealth: Payer: Self-pay

## 2020-11-20 NOTE — Telephone Encounter (Signed)
Per Dr. Lalla Brothers, scheduled the patient for post-hospital evaluation with him 9/20. At that visit, Watchman scheduling will be discussed and A/C will be ordered if proceeding. The patient was grateful for call and agrees with plan.

## 2020-12-02 NOTE — Progress Notes (Signed)
Electrophysiology Office Follow up Visit Note:    Date:  12/03/2020   ID:  Jack Neal, DOB Oct 17, 1945, MRN [id number]  PCP:  [facility]  [facility] Cardiologist:  None  [facility] Electrophysiologist:  Vickie Epley, MD    Interval History:    Jack Neal is a 75 y.o. male who presents for a follow up visit. They were last seen in clinic October 29, 2020 for his atrial fibrillation.  He has a pacemaker in place and is previously undergone an AV junction ablation.  He has had prior falls.  I saw him for an opinion regarding his candidacy for a watchman device.  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  According to the [place] records he has frequent falls.  Since I last saw the patient he has been hospitalized for shaking.  This was August 27 through 30.  He has been doing well since his hospitalization.  He is with his wife today in clinic who I previously met.  He is planning to attend a class reunion in [place] in October.   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  Current Medications: Current Meds  Medication Sig   ascorbic acid (VITAMIN C) 1000 MG tablet Take 1 tablet by mouth daily.   buprenorphine-naloxone (SUBOXONE) 2-0.5 mg SUBL SL tablet Place 1 tablet under the tongue daily.   camphor-menthol (SARNA) lotion Apply 1 application topically daily as needed for itching.   cyanocobalamin 1000  MCG tablet Take 1,000 mcg by mouth daily.   diclofenac Sodium (VOLTAREN) 1 % GEL Apply 2 g topically daily. For feet pain   doxepin (SINEQUAN) 10 MG capsule Take 10 mg by mouth at bedtime.   DULoxetine (CYMBALTA) 30 MG capsule Take 30 mg by mouth 3 (three) times daily.   ferrous sulfate 325 (65 FE) MG tablet Take 325 mg by mouth See admin instructions. Take one tablet by mouth on Monday Wednesdays and Fridays per patient   hydrOXYzine (ATARAX/VISTARIL) 10 MG tablet Take 1 tablet (10 mg total) by mouth 3 (three) times daily as needed for anxiety (Severe jerking/tremors.).   lidocaine (LIDODERM) 5 % Place 1 patch onto the skin daily as needed (For feet pain per patient).   lipase/protease/amylase (CREON) 12000-38000 units CPEP capsule Take 36,000 Units by mouth 3 (three) times daily with meals.   Loperamide HCl (IMODIUM PO) Take 1 mg by mouth daily.   Multiple Vitamin (MULTIVITAMIN) capsule Take 1 capsule by mouth daily.   pyridoxine (B-6) 100 MG tablet Take 1 tablet by mouth daily.     Allergies:   Amphetamine, Diazepam, Zoloft [sertraline], Gabapentin, Gluten meal, and Penicillin g   Social History   Socioeconomic History   Marital status: Married    Spouse name: Not on file   Number of children: Not on file   Years of education: Not on file   Highest education level: Not on  file  Occupational History   Not on file  Tobacco Use   Smoking status: Former    Types: Cigarettes   Smokeless tobacco: Never  Substance and Sexual Activity   Alcohol use: Not Currently   Drug use: Never   Sexual activity: Yes  Other Topics Concern   Not on file  Social History Narrative   Not on file   Social Determinants of Health   Financial Resource Strain: Not on file  Food Insecurity: Not on file  Transportation Needs: Not on file  Physical Activity: Not on file  Stress: Not on file  Social Connections: Not on file     Family History: The patient's family history is negative for Heart  disease.  ROS:   Please see the history of present illness.    All other systems reviewed and are negative.  EKGs/Labs/Other Studies Reviewed:    The following studies were reviewed today:  November 12, 2020 CT appendage protocol IMPRESSION: 1. The left atrial appendage appears windsock in morphology. There is systolic filling defect in the left atrial appendage that is not present in delayed sequence. This is not suggestive of thrombus.  2. A 31 mm watchman FLX device is recommended based on the above landing zone measurements (25 mm maximum diameter; 20 % compression).  3. An mid-posterior IAS puncture site is recommended.  4. Optimal deployment angle: RAO 32 CRA 22  5. Normal coronary origin. Left dominance.  6. Coronary artery calcium score 761, 77th for age, sex, and race matched control.   EKG today shows ventricular pacing and atrial fibrillation.      Recent Labs: 10/04/2020: TSH 1.543 11/09/2020: Magnesium 1.9 11/11/2020: ALT 18; BUN 17; Creatinine, Ser 0.78; Potassium 3.9; Sodium 138 11/12/2020: Hemoglobin 12.0; Platelets 155  Recent Lipid Panel    Component Value Date/Time   CHOL 108 10/02/2020 0301   TRIG 27 10/02/2020 0301   HDL 48 10/02/2020 0301   CHOLHDL 2.3 10/02/2020 0301   VLDL 5 10/02/2020 0301   LDLCALC 55 10/02/2020 0301    Physical Exam:    VS:  BP 104/68   Pulse 84   Ht $R'5\' 11"'rm$  (1.803 m)   Wt 178 lb (80.7 kg)   SpO2 97%   BMI 24.83 kg/m     Wt Readings from Last 3 Encounters:  12/03/20 178 lb (80.7 kg)  11/12/20 172 lb 4.8 oz (78.2 kg)  10/29/20 186 lb 9.6 oz (84.6 kg)     GEN:  Well nourished, well developed in no acute distress HEENT: Normal NECK: No JVD; No carotid bruits LYMPHATICS: No lymphadenopathy CARDIAC: RRR, no murmurs, rubs, gallops RESPIRATORY:  Clear to auscultation without rales, wheezing or rhonchi  ABDOMEN: Soft, non-tender, non-distended MUSCULOSKELETAL:  No edema; No deformity  SKIN: Warm and dry NEUROLOGIC:   Alert and oriented x 3 PSYCHIATRIC:  Normal affect   ASSESSMENT:    1. Permanent atrial fibrillation ([place])   2. Fall, sequela   3. Presence of cardiac pacemaker    PLAN:    In order of problems listed above:  1. Permanent atrial fibrillation ([name]) Off anticoagulation because of frequent falls.  He is interested in pursuing the watchman device.  I think this is very reasonable given his history.  His CHA2DS2-VASc is at least 2.  During today's clinic appointment, we discussed the procedure again in detail including the risk.  I have recommended starting Eliquis 2.5 mg by mouth twice daily and aspirin 81 mg by mouth once daily.  He  will need to be on this regimen for at least 3 weeks prior to the [facility] implant.  I discussed the data supporting the half dose Eliquis in detail.  I would plan on using this for at least the first 45 days after the watchman implant.  We discussed changing to Plavix monotherapy at the 45-day mark after implant and continuing this for at least 6 months after the watchman implant.  Procedural risks for the Watchman implant have been reviewed with the patient including a 0.5% risk of stroke, <1% risk of perforation and <1% risk of device embolization.     2. Fall, sequela See #1.  3. Presence of cardiac pacemaker Stable function     Medication Adjustments/Labs and Tests Ordered: Current medicines are reviewed at length with the patient today.  Concerns regarding medicines are outlined above.  No orders of the defined types were placed in this encounter.  No orders of the defined types were placed in this encounter.    Signed, Lars Mage, MD, [facility], [facility] 12/03/2020 10:42 AM    Electrophysiology [facility]

## 2020-12-02 NOTE — H&P (View-Only) (Signed)
Electrophysiology Office Follow up Visit Note:    Date:  12/03/2020   ID:  Jack Neal, DOB 05-17-1945, MRN [id number]  PCP:  [facility]  [facility] Cardiologist:  None  [facility] Electrophysiologist:  Vickie Epley, MD    Interval History:    Jack Neal is a 75 y.o. male who presents for a follow up visit. They were last seen in clinic October 29, 2020 for his atrial fibrillation.  He has a pacemaker in place and is previously undergone an AV junction ablation.  He has had prior falls.  I saw him for an opinion regarding his candidacy for a watchman device.  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  According to the [place] records he has frequent falls.  Since I last saw the patient he has been hospitalized for shaking.  This was August 27 through 30.  He has been doing well since his hospitalization.  He is with his wife today in clinic who I previously met.  He is planning to attend a class reunion in [place] in October.   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  Current Medications: Current Meds  Medication Sig   ascorbic acid (VITAMIN C) 1000 MG tablet Take 1 tablet by mouth daily.   buprenorphine-naloxone (SUBOXONE) 2-0.5 mg SUBL SL tablet Place 1 tablet under the tongue daily.   camphor-menthol (SARNA) lotion Apply 1 application topically daily as needed for itching.   cyanocobalamin 1000  MCG tablet Take 1,000 mcg by mouth daily.   diclofenac Sodium (VOLTAREN) 1 % GEL Apply 2 g topically daily. For feet pain   doxepin (SINEQUAN) 10 MG capsule Take 10 mg by mouth at bedtime.   DULoxetine (CYMBALTA) 30 MG capsule Take 30 mg by mouth 3 (three) times daily.   ferrous sulfate 325 (65 FE) MG tablet Take 325 mg by mouth See admin instructions. Take one tablet by mouth on Monday Wednesdays and Fridays per patient   hydrOXYzine (ATARAX/VISTARIL) 10 MG tablet Take 1 tablet (10 mg total) by mouth 3 (three) times daily as needed for anxiety (Severe jerking/tremors.).   lidocaine (LIDODERM) 5 % Place 1 patch onto the skin daily as needed (For feet pain per patient).   lipase/protease/amylase (CREON) 12000-38000 units CPEP capsule Take 36,000 Units by mouth 3 (three) times daily with meals.   Loperamide HCl (IMODIUM PO) Take 1 mg by mouth daily.   Multiple Vitamin (MULTIVITAMIN) capsule Take 1 capsule by mouth daily.   pyridoxine (B-6) 100 MG tablet Take 1 tablet by mouth daily.     Allergies:   Amphetamine, Diazepam, Zoloft [sertraline], Gabapentin, Gluten meal, and Penicillin g   Social History   Socioeconomic History   Marital status: Married    Spouse name: Not on file   Number of children: Not on file   Years of education: Not on file   Highest education level: Not on  file  Occupational History   Not on file  Tobacco Use   Smoking status: Former    Types: Cigarettes   Smokeless tobacco: Never  Substance and Sexual Activity   Alcohol use: Not Currently   Drug use: Never   Sexual activity: Yes  Other Topics Concern   Not on file  Social History Narrative   Not on file   Social Determinants of Health   Financial Resource Strain: Not on file  Food Insecurity: Not on file  Transportation Needs: Not on file  Physical Activity: Not on file  Stress: Not on file  Social Connections: Not on file     Family History: The patient's family history is negative for Heart  disease.  ROS:   Please see the history of present illness.    All other systems reviewed and are negative.  EKGs/Labs/Other Studies Reviewed:    The following studies were reviewed today:  November 12, 2020 CT appendage protocol IMPRESSION: 1. The left atrial appendage appears windsock in morphology. There is systolic filling defect in the left atrial appendage that is not present in delayed sequence. This is not suggestive of thrombus.  2. A 31 mm watchman FLX device is recommended based on the above landing zone measurements (25 mm maximum diameter; 20 % compression).  3. An mid-posterior IAS puncture site is recommended.  4. Optimal deployment angle: RAO 32 CRA 22  5. Normal coronary origin. Left dominance.  6. Coronary artery calcium score 761, 77th for age, sex, and race matched control.   EKG today shows ventricular pacing and atrial fibrillation.      Recent Labs: 10/04/2020: TSH 1.543 11/09/2020: Magnesium 1.9 11/11/2020: ALT 18; BUN 17; Creatinine, Ser 0.78; Potassium 3.9; Sodium 138 11/12/2020: Hemoglobin 12.0; Platelets 155  Recent Lipid Panel    Component Value Date/Time   CHOL 108 10/02/2020 0301   TRIG 27 10/02/2020 0301   HDL 48 10/02/2020 0301   CHOLHDL 2.3 10/02/2020 0301   VLDL 5 10/02/2020 0301   LDLCALC 55 10/02/2020 0301    Physical Exam:    VS:  BP 104/68   Pulse 84   Ht $R'5\' 11"'mH$  (1.803 m)   Wt 178 lb (80.7 kg)   SpO2 97%   BMI 24.83 kg/m     Wt Readings from Last 3 Encounters:  12/03/20 178 lb (80.7 kg)  11/12/20 172 lb 4.8 oz (78.2 kg)  10/29/20 186 lb 9.6 oz (84.6 kg)     GEN:  Well nourished, well developed in no acute distress HEENT: Normal NECK: No JVD; No carotid bruits LYMPHATICS: No lymphadenopathy CARDIAC: RRR, no murmurs, rubs, gallops RESPIRATORY:  Clear to auscultation without rales, wheezing or rhonchi  ABDOMEN: Soft, non-tender, non-distended MUSCULOSKELETAL:  No edema; No deformity  SKIN: Warm and dry NEUROLOGIC:   Alert and oriented x 3 PSYCHIATRIC:  Normal affect   ASSESSMENT:    1. Permanent atrial fibrillation ([place])   2. Fall, sequela   3. Presence of cardiac pacemaker    PLAN:    In order of problems listed above:  1. Permanent atrial fibrillation ([name]) Off anticoagulation because of frequent falls.  He is interested in pursuing the watchman device.  I think this is very reasonable given his history.  His CHA2DS2-VASc is at least 2.  During today's clinic appointment, we discussed the procedure again in detail including the risk.  I have recommended starting Eliquis 2.5 mg by mouth twice daily and aspirin 81 mg by mouth once daily.  He  will need to be on this regimen for at least 3 weeks prior to the [facility] implant.  I discussed the data supporting the half dose Eliquis in detail.  I would plan on using this for at least the first 45 days after the watchman implant.  We discussed changing to Plavix monotherapy at the 45-day mark after implant and continuing this for at least 6 months after the watchman implant.  Procedural risks for the Watchman implant have been reviewed with the patient including a 0.5% risk of stroke, <1% risk of perforation and <1% risk of device embolization.     2. Fall, sequela See #1.  3. Presence of cardiac pacemaker Stable function     Medication Adjustments/Labs and Tests Ordered: Current medicines are reviewed at length with the patient today.  Concerns regarding medicines are outlined above.  No orders of the defined types were placed in this encounter.  No orders of the defined types were placed in this encounter.    Signed, Lars Mage, MD, [facility], [facility] 12/03/2020 10:42 AM    Electrophysiology [facility]

## 2020-12-03 ENCOUNTER — Ambulatory Visit (INDEPENDENT_AMBULATORY_CARE_PROVIDER_SITE_OTHER): Payer: No Typology Code available for payment source | Admitting: Cardiology

## 2020-12-03 ENCOUNTER — Other Ambulatory Visit: Payer: Self-pay

## 2020-12-03 VITALS — BP 104/68 | HR 84 | Ht 71.0 in | Wt 178.0 lb

## 2020-12-03 DIAGNOSIS — I4821 Permanent atrial fibrillation: Secondary | ICD-10-CM | POA: Diagnosis not present

## 2020-12-03 DIAGNOSIS — R296 Repeated falls: Secondary | ICD-10-CM | POA: Diagnosis not present

## 2020-12-03 DIAGNOSIS — W19XXXS Unspecified fall, sequela: Secondary | ICD-10-CM

## 2020-12-03 DIAGNOSIS — Z950 Presence of cardiac pacemaker: Secondary | ICD-10-CM

## 2020-12-03 LAB — BASIC METABOLIC PANEL
BUN/Creatinine Ratio: 38 — ABNORMAL HIGH (ref 10–24)
BUN: 27 mg/dL (ref 8–27)
CO2: 31 mmol/L — ABNORMAL HIGH (ref 20–29)
Calcium: 8.8 mg/dL (ref 8.6–10.2)
Chloride: 100 mmol/L (ref 96–106)
Creatinine, Ser: 0.72 mg/dL — ABNORMAL LOW (ref 0.76–1.27)
Glucose: 88 mg/dL (ref 65–99)
Potassium: 4.4 mmol/L (ref 3.5–5.2)
Sodium: 141 mmol/L (ref 134–144)
eGFR: 96 mL/min/{1.73_m2} (ref >59)

## 2020-12-03 LAB — CBC WITH DIFFERENTIAL/PLATELET
Basophils Absolute: 0 10*3/uL (ref 0.0–0.2)
Basos: 1 %
EOS (ABSOLUTE): 0.1 10*3/uL (ref 0.0–0.4)
Eos: 3 %
Hematocrit: 34.6 % — ABNORMAL LOW (ref 37.5–51.0)
Hemoglobin: 12.1 g/dL — ABNORMAL LOW (ref 13.0–17.7)
Immature Grans (Abs): 0 10*3/uL (ref 0.0–0.1)
Immature Granulocytes: 0 %
Lymphocytes Absolute: 1.3 10*3/uL (ref 0.7–3.1)
Lymphs: 34 %
MCH: 32.3 pg (ref 26.6–33.0)
MCHC: 35 g/dL (ref 31.5–35.7)
MCV: 92 fL (ref 79–97)
Monocytes Absolute: 0.5 10*3/uL (ref 0.1–0.9)
Monocytes: 14 %
Neutrophils Absolute: 1.9 10*3/uL (ref 1.4–7.0)
Neutrophils: 48 %
Platelets: 133 10*3/uL — ABNORMAL LOW (ref 150–450)
RBC: 3.75 x10E6/uL — ABNORMAL LOW (ref 4.14–5.80)
RDW: 12.5 % (ref 11.6–15.4)
WBC: 3.9 10*3/uL (ref 3.4–10.8)

## 2020-12-03 MED ORDER — APIXABAN 2.5 MG PO TABS: 2.5000 mg | ORAL_TABLET | Freq: Two times a day (BID) | ORAL | 11 refills | Status: DC

## 2020-12-03 MED ORDER — ASPIRIN EC 81 MG PO TBEC: 81.0000 mg | DELAYED_RELEASE_TABLET | Freq: Every day | ORAL | 3 refills | Status: AC

## 2020-12-03 NOTE — Patient Instructions (Addendum)
Medication Instructions:  Your physician has recommended you make the following change in your medication:    START taking aspirin 81 mg -  Take one tablet by mouth daily  2.   START taking ELIQUIS 2.5 mg-  Take one tablet by mouth twice a day   Lab Work: You will get lab work today:  CBC and BMP  If you have labs (blood work) drawn today and your tests are completely normal, you will receive your results only by: [employer] (if you have MyChart) OR A paper copy in the mail If you have any lab test that is abnormal or we need to change your treatment, we will call you to review the results.  Testing/Procedures:  You are scheduled for Watchman implant on January 02, 2021.  Follow-Up: At [facility], you and your health needs are our priority.  As part of our continuing mission to provide you with exceptional heart care, we have created designated Provider Care Teams.  These Care Teams include your primary Cardiologist (physician) and Advanced Practice Providers (APPs -  Physician Assistants and Nurse Practitioners) who all work together to provide you with the care you need, when you need it.  COVID SCREENING INFORMATION: Please proceed to have your Covid test on December 31, 2020. [facility] - Main Entrance A. Pull through to the valet parking stand and tell them you are scheduled for a 5 minute Covid test. They will direct you where to park.  Proceed directly to the Pre-Admission Testing department (bypass Admitting). You will need to go home and quarantine after your Covid test until your procedure.   LEFT ATRIAL APPENDAGE CLOSURE INSTRUCTIONS: You are scheduled for a Left Atrial Appendage Device Implantation on Thursday, October 20 with Dr. Lalla Brothers.  1. Please arrive at the [facility] (Main Entrance A) at [facility]: [address] [place], [place] [address] at 5:30 AM (This time is two hours before your procedure to ensure your preparation). Free valet  parking service is available. You are allowed ONE visitor in the waiting room during your procedure. Both you and your guest must wear masks. Special note: Every effort is made to have your procedure done on time. Please understand that emergencies sometimes delay scheduled procedures.  2.   Do not eat or drink after midnight prior to your procedure.     3.   Medication Instructions:  On the morning of your procedure DO NOT take ANY medication.  4. The day of surgery, please wear clean and comfortable clothes that are easy to get on and off and wear slip-on shoes. Do not wear lotions, powders, perfumes/colognes, or deodorant. Brush your teeth with your regular toothpaste. Do not wear jewelry, make up, or nail polish (including SNS, gel, acrylic, etc.). Do not bring valuables to the hospital - [facility] is not responsible for any belongings or valuables.  5. Plan for one night stay. When you are discharged, you will need someone to drive you home and stay with you for 24 hours.  6. Bring a current list of your medications and current insurance cards.  7. Jamelle Rushing Nurse, will arrange your post-procedure visits during your hospital stay. You will be discharged with dates and times of your follow-up appointments and 45-day TEE (TEE instructions will be reviewed at the follow-up visit). [facility]'s direct number is [phone] if you need assistance.  **If you have any questions, do not hesitate to call! You will also be contacted the week of  your procedure to confirm instructions.**

## 2020-12-17 ENCOUNTER — Telehealth: Payer: Self-pay

## 2020-12-17 NOTE — Telephone Encounter (Signed)
NOTES SCANNED TO REFERRAL RJ 

## 2020-12-19 ENCOUNTER — Telehealth: Payer: Self-pay

## 2020-12-19 NOTE — Telephone Encounter (Signed)
Scheduled patient for MyChart video visit for Watchman shared decision making on 12/23/2020 with Dr. Izora Ribas.  Confirmed with the patient he has a [employer] PPM. Will resend instructions to include Covid testing information via MyChart. The patient was grateful for call and agrees with plan.   Evisit consent obtained.     Patient Consent for Virtual Visit        Jack Neal has provided verbal consent on 12/19/2020 for a virtual visit (video or telephone).   CONSENT FOR VIRTUAL VISIT FOR:  Jack Neal  By participating in this virtual visit I agree to the following:  I hereby voluntarily request, consent and authorize [facility] and its employed or contracted physicians, physician assistants, nurse practitioners or other licensed health care professionals (the Practitioner), to provide me with telemedicine health care services (the "Services") as deemed necessary by the treating Practitioner. I acknowledge and consent to receive the Services by the Practitioner via telemedicine. I understand that the telemedicine visit will involve communicating with the Practitioner through live audiovisual communication technology and the disclosure of certain medical information by electronic transmission. I acknowledge that I have been given the opportunity to request an in-person assessment or other available alternative prior to the telemedicine visit and am voluntarily participating in the telemedicine visit.  I understand that I have the right to withhold or withdraw my consent to the use of telemedicine in the course of my care at any time, without affecting my right to future care or treatment, and that the Practitioner or I may terminate the telemedicine visit at any time. I understand that I have the right to inspect all information obtained and/or recorded in the course of the telemedicine visit and may receive copies of available information for a reasonable fee.  I understand that some of  the potential risks of receiving the Services via telemedicine include:  Delay or interruption in medical evaluation due to technological equipment failure or disruption; Information transmitted may not be sufficient (e.g. poor resolution of images) to allow for appropriate medical decision making by the Practitioner; and/or  In rare instances, security protocols could fail, causing a breach of personal health information.  Furthermore, I acknowledge that it is my responsibility to provide information about my medical history, conditions and care that is complete and accurate to the best of my ability. I acknowledge that Practitioner's advice, recommendations, and/or decision may be based on factors not within their control, such as incomplete or inaccurate data provided by me or distortions of diagnostic images or specimens that may result from electronic transmissions. I understand that the practice of medicine is not an exact science and that Practitioner makes no warranties or guarantees regarding treatment outcomes. I acknowledge that a copy of this consent can be made available to me via my patient portal [facility] MyChart), or I can request a printed copy by calling the office of [facility].    I understand that my insurance will be billed for this visit.   I have read or had this consent read to me. I understand the contents of this consent, which adequately explains the benefits and risks of the Services being provided via telemedicine.  I have been provided ample opportunity to ask questions regarding this consent and the Services and have had my questions answered to my satisfaction. I give my informed consent for the services to be provided through the use of telemedicine in my medical care

## 2020-12-23 ENCOUNTER — Other Ambulatory Visit: Payer: Self-pay

## 2020-12-23 ENCOUNTER — Encounter: Payer: Self-pay | Admitting: Internal Medicine

## 2020-12-23 ENCOUNTER — Telehealth (INDEPENDENT_AMBULATORY_CARE_PROVIDER_SITE_OTHER): Payer: No Typology Code available for payment source | Admitting: Internal Medicine

## 2020-12-23 VITALS — BP 123/60 | HR 83 | Ht 71.0 in | Wt 167.0 lb

## 2020-12-23 DIAGNOSIS — I341 Nonrheumatic mitral (valve) prolapse: Secondary | ICD-10-CM | POA: Diagnosis not present

## 2020-12-23 DIAGNOSIS — Z950 Presence of cardiac pacemaker: Secondary | ICD-10-CM

## 2020-12-23 DIAGNOSIS — I4821 Permanent atrial fibrillation: Secondary | ICD-10-CM | POA: Diagnosis not present

## 2020-12-23 NOTE — Progress Notes (Signed)
I connected with  Avyukt Cimo on 12/23/20 by a video enabled telemedicine application and verified that I am speaking with the correct person using two identifiers.   I discussed the limitations of evaluation and management by telemedicine. The patient expressed understanding and agreed to proceed.  Patient location :Home MD Location: Office Time:11 minutes Modality: Video for entire visit.   Cardiology Office Note:    Date:  12/23/2020   ID:  Jack Neal, DOB [birth year], MRN [id number]  PCP:  [facility]   [facility] Providers Cardiologist:  None Electrophysiologist:  Lanier Prude, MD     Referring MD: [facility]   CC:  Watchman Shared Decision Making  History of Present Illness:    Jack Neal is a 75 y.o. male with a hx of permanent atrial fibrillation s/p AVJ and PPM, per chart review history of HTN.  [facility] to have [facility].  Seen 12/23/20.  Patient notes that he is doing ok but that he is dizzy all the time.   Has 2-3 episodes of exercise related vertigo a day.  Last fall was one month ago  No chest pain or pressure.  No SOB/DOE and no PND/Orthopnea.  No weight gain or leg swelling.  No palpitations despite frequent falls.  Has had a serious fall in the past with three concussions, needing stitches.  Other falls notes separated shoulders and significant bruising.  No swallowing issues or dysphagia.  Notes that he has MVP.  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 No pertinent surgical history.  Current Medications: Current Meds  Medication Sig   apixaban (ELIQUIS) 2.5 MG TABS tablet Take 1 tablet (2.5 mg total) by mouth 2 (two) times daily.   ascorbic acid (VITAMIN C) 1000 MG tablet Take 1 tablet by mouth daily.   aspirin EC 81 MG tablet Take 1 tablet (81 mg total) by mouth daily. Swallow whole.   buprenorphine-naloxone (SUBOXONE) 2-0.5 mg SUBL SL tablet Place 1 tablet under the tongue daily.   camphor-menthol (SARNA) lotion Apply 1 application topically daily as needed for itching.   diclofenac Sodium (VOLTAREN) 1 % GEL Apply 2 g topically daily. For feet pain   doxepin (SINEQUAN) 10 MG capsule Take 10 mg by mouth at bedtime.   DULoxetine (CYMBALTA) 30 MG capsule Take 30 mg by mouth 3 (three) times daily.   ferrous sulfate 325 (65 FE) MG tablet Take 325 mg by mouth See admin instructions. Take one tablet by mouth on Monday Wednesdays and Fridays per patient   hydrOXYzine (ATARAX/VISTARIL) 10 MG tablet Take 1 tablet (10 mg total) by mouth 3 (three) times daily as needed for anxiety (Severe jerking/tremors.).   lidocaine (LIDODERM) 5 % Place 1 patch onto the skin daily as needed (For feet pain per patient).   lipase/protease/amylase (CREON) 12000-38000 units CPEP capsule Take 36,000 Units by mouth 3 (three) times daily with meals.   Loperamide HCl (IMODIUM PO) Take 1 mg  by mouth daily.   Multiple Vitamin (MULTIVITAMIN) capsule Take 1 capsule by mouth daily.   omeprazole (PRILOSEC) 40 MG capsule TAKE ONE CAPSULE BY MOUTH DAILY (TAKE ON AN EMPTY STOMACH 30 MINUTES PRIOR TO A MEAL) TO DECREASE ACID REFLUX     Allergies:   Amphetamine, Diazepam, Zoloft [sertraline], Gabapentin, Gluten meal, and Penicillin g   Social History   Socioeconomic History   Marital status: Married    Spouse name: Not on file   Number of children: 3   Years of education: Not on file   Highest education level: Not on file   Occupational History   Occupation: Retired  Tobacco Use   Smoking status: Former    Types: Cigarettes   Smokeless tobacco: Never  Substance and Sexual Activity   Alcohol use: Not Currently   Drug use: Never   Sexual activity: Yes  Other Topics Concern   Not on file  Social History Narrative   Not on file   Social Determinants of Health   Financial Resource Strain: Not on file  Food Insecurity: Not on file  Transportation Needs: Not on file  Physical Activity: Not on file  Stress: Not on file  Social Connections: Not on file     Family History: The patient's family history includes Brain cancer in his sister; Heart attack in his mother; Ulcers in his father. There is no history of Heart disease.  ROS:   Please see the history of present illness.    All other systems reviewed and are negative.  EKGs/Labs/Other Studies Reviewed:    Cardiac CT: Date:11/11/20 Results: IMPRESSION: 1. The left atrial appendage appears windsock in morphology. There is systolic filling defect in the left atrial appendage that is not present in delayed sequence. This is not suggestive of thrombus.   2. A 31 mm watchman FLX device is recommended based on the above landing zone measurements (25 mm maximum diameter; 20 % compression).   3. An mid-posterior IAS puncture site is recommended.   4. Optimal deployment angle: RAO 32 CRA 22   5. Normal coronary origin. Left dominance.   6. Coronary artery calcium score 761, 77th for age, sex, and race matched control.   Recent Labs: 10/04/2020: TSH 1.543 11/09/2020: Magnesium 1.9 11/11/2020: ALT 18 12/03/2020: BUN 27; Creatinine, Ser 0.72; Hemoglobin 12.1; Platelets 133; Potassium 4.4; Sodium 141  Recent Lipid Panel    Component Value Date/Time   CHOL 108 10/02/2020 0301   TRIG 27 10/02/2020 0301   HDL 48 10/02/2020 0301   CHOLHDL 2.3 10/02/2020 0301   VLDL 5 10/02/2020 0301   LDLCALC 55 10/02/2020 0301     Risk  Assessment/Calculations:    CHA2DS2-VASc Score = 2   This indicates a 2.2% annual risk of stroke. The patient's score is based upon: CHF History: 0 HTN History: 1 Diabetes History: 0 Stroke History: 0 Vascular Disease History: 0 Age Score: 1 Gender Score: 0         Physical Exam:    Today's Vitals   12/23/20 0815  BP: 123/60  Pulse: 83  SpO2: 93%  Weight: 167 lb (75.8 kg)  Height: 5\' 11"  (1.803 m)   Body mass index is 23.29 kg/m.  Gen: well no distress Lung: normal respiratory  Psych: Normal mood and affect Neuro: AOX4  ASSESSMENT:    1. Permanent atrial fibrillation ([name])   2. Status post biventricular pacemaker   3. MVP (mitral valve prolapse)    PLAN:    Permanent Atrial Fibrillation  MVP -  Discussed risks and benefits of left atrial appendage occlusion device both assuming a CHASVASC of 1 and 2 given distant need for BP meds (likely rate control related) - discussed imaging of MVP at time of TEE - All questions answered, patient is amenable for procedure - no changes in meds - no change in f/u  I have seen Jack Neal is a 75 y.o. male in the office today. The patient is felt to be a poor candidate for long-term anticoagulation because of of frequent falls.  Their CHADS-2-Vasc Score and unadjusted Ischemic Stroke Rate (% per year) is equal to 2.2 % stroke rate/year from a score of 2, necessitating a strategy of stroke prevention with either long-term oral anticoagulation or left atrial appendage occlusion therapy. We have discussed their bleeding risk in the context of their comorbid medical problems, as well as the rationale for referral for evaluation of Watchman left atrial appendage occlusion therapy. While the patient is at high long-term bleeding risk, they may be appropriate for short-term anticoagulation. Based on this individual patient's stroke and bleeding risk, a shared decision has been made to refer the patient for consideration of Watchman left  atrial appendage closure utilizing the [employer] of Cardiology shared decision tool.  Shared Decision Making/Informed Consent The risks [esophageal damage, perforation (1:10,000 risk), bleeding, pharyngeal hematoma as well as other potential complications associated with conscious sedation including aspiration, arrhythmia, respiratory failure and death], benefits (treatment guidance and diagnostic support) and alternatives of a Intervential transesophageal echocardiogram were discussed in detail with Mr. Cartmell and he is willing to proceed.     Medication Adjustments/Labs and Tests Ordered: Current medicines are reviewed at length with the patient today.  Concerns regarding medicines are outlined above.  No orders of the defined types were placed in this encounter.  No orders of the defined types were placed in this encounter.   Patient Instructions  Medication Instructions:  Your physician recommends that you continue on your current medications as directed. Please refer to the Current Medication list given to you today.  *If you need a refill on your cardiac medications before your next appointment, please call your pharmacy*   Lab Work: NONE If you have labs (blood work) drawn today and your tests are completely normal, you will receive your results only by: MyChart Message (if you have MyChart) OR A paper copy in the mail If you have any lab test that is abnormal or we need to change your treatment, we will call you to review the results.   Testing/Procedures: NONE   Follow-Up:  Watchman implant on 01/02/21  At [facility], you and your health needs are our priority.  As part of our continuing mission to provide you with exceptional heart care, we have created designated Provider Care Teams.  These Care Teams include your primary Cardiologist (physician) and Advanced Practice Providers (APPs -  Physician Assistants and Nurse Practitioners) who all work  together to provide you with the care you need, when you need it.    Signed, Christell Constant, MD  12/23/2020 9:05 AM    [facility]

## 2020-12-23 NOTE — Patient Instructions (Signed)
Medication Instructions:  Your physician recommends that you continue on your current medications as directed. Please refer to the Current Medication list given to you today.  *If you need a refill on your cardiac medications before your next appointment, please call your pharmacy*   Lab Work: NONE If you have labs (blood work) drawn today and your tests are completely normal, you will receive your results only by: MyChart Message (if you have MyChart) OR A paper copy in the mail If you have any lab test that is abnormal or we need to change your treatment, we will call you to review the results.   Testing/Procedures: NONE   Follow-Up:  Watchman implant on 01/02/21  At [facility], you and your health needs are our priority.  As part of our continuing mission to provide you with exceptional heart care, we have created designated Provider Care Teams.  These Care Teams include your primary Cardiologist (physician) and Advanced Practice Providers (APPs -  Physician Assistants and Nurse Practitioners) who all work together to provide you with the care you need, when you need it.

## 2020-12-30 ENCOUNTER — Telehealth: Payer: Self-pay

## 2020-12-30 NOTE — Telephone Encounter (Signed)
Reviewed Watchman instructions with patient and confirmed Covid testing tomorrow. He understands he will be called if anything needs to be rescheduled based on results. Confirmed with patient he has a Medtronic PPM. Rep and cath lab notified. He was grateful for call and agrees with plan.

## 2020-12-31 ENCOUNTER — Other Ambulatory Visit (HOSPITAL_COMMUNITY): Payer: No Typology Code available for payment source

## 2020-12-31 ENCOUNTER — Other Ambulatory Visit (HOSPITAL_COMMUNITY)
Admission: RE | Admit: 2020-12-31 | Discharge: 2020-12-31 | Disposition: A | Discharge status: Home / Self Care | Payer: No Typology Code available for payment source | Source: Ambulatory Visit | Attending: Cardiology | Admitting: Cardiology

## 2020-12-31 DIAGNOSIS — Z01812 Encounter for preprocedural laboratory examination: Secondary | ICD-10-CM | POA: Insufficient documentation

## 2020-12-31 DIAGNOSIS — Z20822 Contact with and (suspected) exposure to covid-19: Secondary | ICD-10-CM | POA: Diagnosis not present

## 2020-12-31 LAB — SARS CORONAVIRUS 2 (TAT 6-24 HRS): SARS Coronavirus 2: NEGATIVE

## 2021-01-01 ENCOUNTER — Other Ambulatory Visit: Payer: Self-pay

## 2021-01-01 NOTE — Progress Notes (Signed)
[facility] [place]  Cardiologist - Denies  EP-Dr. Jeanie Cooks  Endocrine- Denies  Pulm-Denies  Chest x-ray - 11/09/20 (E)  EKG - 01/02/21 (DOS)  Stress Test - Denies  ECHO - 10/02/20 (E)  Cardiac Cath - Denies  AICD-na PM- Medtronic LOOP-Denies Dialysis-Denies  Sleep Study - Yes- Positive CPAP - Yes- will bring day of surgery  LABS- 12/03/20: CBC w/D, BMP 12/31/20: COVID-Neg 01/02/21: T/S x2, PCR  ASA- LD- 10/19 Eliquis- LD- 10/19  ERAS- No  HA1C- Denies  Anesthesia- No  Pt denies having chest pain, sob, or fever during the pre-op phone call. All instructions explained to the pt, with a verbal understanding of the material including. Pt also instructed to wear a mask and social distance after being tested for COVID-19. The opportunity to ask questions was provided.    Coronavirus Screening  Have you experienced the following symptoms:  Cough yes/no: No Fever (>100.77F)  yes/no: No Runny nose yes/no: No Sore throat yes/no: No Difficulty breathing/shortness of breath  yes/no: No  Have you or a family member traveled in the last 14 days and where? yes/no: No   If the patient indicates "YES" to the above questions, their PAT will be rescheduled to limit the exposure to others and, the surgeon will be notified. THE PATIENT WILL NEED TO BE ASYMPTOMATIC FOR 14 DAYS.   If the patient is not experiencing any of these symptoms, the PAT nurse will instruct them to NOT bring anyone with them to their appointment since they may have these symptoms or traveled as well.   Please remind your patients and families that hospital visitation restrictions are in effect and the importance of the restrictions.

## 2021-01-01 NOTE — Anesthesia Preprocedure Evaluation (Addendum)
Anesthesia Evaluation  Patient identified by MRN, date of birth, ID band Patient awake    Reviewed: Allergy & Precautions, H&P , NPO status , Patient's Chart, lab work & pertinent test results  Airway Mallampati: II  TM Distance: >3 FB Neck ROM: Full    Dental no notable dental hx. (+) Teeth Intact, Dental Advisory Given   Pulmonary sleep apnea , former smoker,    Pulmonary exam normal breath sounds clear to auscultation       Cardiovascular + dysrhythmias Atrial Fibrillation + pacemaker  Rhythm:Irregular Rate:Normal     Neuro/Psych Anxiety Depression negative neurological ROS     GI/Hepatic Neg liver ROS, GERD  Medicated,  Endo/Other  negative endocrine ROS  Renal/GU negative Renal ROS  negative genitourinary   Musculoskeletal   Abdominal   Peds  Hematology negative hematology ROS (+)   Anesthesia Other Findings   Reproductive/Obstetrics negative OB ROS                            Anesthesia Physical Anesthesia Plan  ASA: 3  Anesthesia Plan: General   Post-op Pain Management:    Induction: Intravenous  PONV Risk Score and Plan: 3 and Ondansetron, Dexamethasone and Treatment may vary due to age or medical condition  Airway Management Planned: Oral ETT  Additional Equipment:   Intra-op Plan:   Post-operative Plan: Extubation in OR and Possible Post-op intubation/ventilation  Informed Consent: I have reviewed the patients History and Physical, chart, labs and discussed the procedure including the risks, benefits and alternatives for the proposed anesthesia with the patient or authorized representative who has indicated his/her understanding and acceptance.     Dental advisory given  Plan Discussed with: CRNA  Anesthesia Plan Comments:      Anesthesia Quick Evaluation

## 2021-01-01 NOTE — Progress Notes (Signed)
Spoke with pt regarding pre-op instructions given by Dr. Lovena Neighbours office. Pt states is to arrive on 01/02/21 at 0530. He is to take all medicines until today 01/01/21, and None the day of surgery. The pt has a Medtronic Pacemaker, and they have already been notified by Dr. Lovena Neighbours office based on the note from 12/30/20.

## 2021-01-02 ENCOUNTER — Inpatient Hospital Stay (HOSPITAL_COMMUNITY): Payer: No Typology Code available for payment source | Admitting: Anesthesiology

## 2021-01-02 ENCOUNTER — Inpatient Hospital Stay (HOSPITAL_COMMUNITY): Payer: No Typology Code available for payment source

## 2021-01-02 ENCOUNTER — Encounter (HOSPITAL_COMMUNITY): Payer: Self-pay | Admitting: Cardiology

## 2021-01-02 ENCOUNTER — Encounter (HOSPITAL_COMMUNITY)
Admission: RE | Disposition: A | Discharge status: Home / Self Care | Payer: No Typology Code available for payment source | Source: Ambulatory Visit | Attending: Cardiology

## 2021-01-02 ENCOUNTER — Inpatient Hospital Stay (HOSPITAL_COMMUNITY)
Admission: RE | Admit: 2021-01-02 | Discharge: 2021-01-03 | DRG: 274 | Disposition: A | Discharge status: Home / Self Care | Payer: No Typology Code available for payment source | Source: Ambulatory Visit | Attending: Cardiology | Admitting: Cardiology

## 2021-01-02 ENCOUNTER — Other Ambulatory Visit: Payer: Self-pay

## 2021-01-02 ENCOUNTER — Inpatient Hospital Stay (HOSPITAL_COMMUNITY)
Admission: RE | Admit: 2021-01-02 | Discharge: 2021-01-02 | Disposition: A | Discharge status: Home / Self Care | Payer: No Typology Code available for payment source | Source: Ambulatory Visit | Attending: Cardiology | Admitting: Cardiology

## 2021-01-02 DIAGNOSIS — I4891 Unspecified atrial fibrillation: Secondary | ICD-10-CM | POA: Diagnosis not present

## 2021-01-02 DIAGNOSIS — Z87820 Personal history of traumatic brain injury: Secondary | ICD-10-CM

## 2021-01-02 DIAGNOSIS — K219 Gastro-esophageal reflux disease without esophagitis: Secondary | ICD-10-CM | POA: Diagnosis present

## 2021-01-02 DIAGNOSIS — Z95818 Presence of other cardiac implants and grafts: Secondary | ICD-10-CM

## 2021-01-02 DIAGNOSIS — Z950 Presence of cardiac pacemaker: Secondary | ICD-10-CM | POA: Diagnosis not present

## 2021-01-02 DIAGNOSIS — Z006 Encounter for examination for normal comparison and control in clinical research program: Secondary | ICD-10-CM

## 2021-01-02 DIAGNOSIS — Z87891 Personal history of nicotine dependence: Secondary | ICD-10-CM

## 2021-01-02 DIAGNOSIS — F4310 Post-traumatic stress disorder, unspecified: Secondary | ICD-10-CM | POA: Diagnosis present

## 2021-01-02 DIAGNOSIS — I4821 Permanent atrial fibrillation: Secondary | ICD-10-CM | POA: Diagnosis not present

## 2021-01-02 DIAGNOSIS — Q2112 Patent foramen ovale: Secondary | ICD-10-CM | POA: Diagnosis not present

## 2021-01-02 DIAGNOSIS — I088 Other rheumatic multiple valve diseases: Secondary | ICD-10-CM

## 2021-01-02 DIAGNOSIS — I700 Atherosclerosis of aorta: Secondary | ICD-10-CM

## 2021-01-02 DIAGNOSIS — I480 Paroxysmal atrial fibrillation: Secondary | ICD-10-CM | POA: Diagnosis present

## 2021-01-02 DIAGNOSIS — Z9181 History of falling: Secondary | ICD-10-CM

## 2021-01-02 DIAGNOSIS — Z01818 Encounter for other preprocedural examination: Secondary | ICD-10-CM

## 2021-01-02 DIAGNOSIS — G4733 Obstructive sleep apnea (adult) (pediatric): Secondary | ICD-10-CM

## 2021-01-02 DIAGNOSIS — F329 Major depressive disorder, single episode, unspecified: Secondary | ICD-10-CM | POA: Diagnosis present

## 2021-01-02 DIAGNOSIS — Z880 Allergy status to penicillin: Secondary | ICD-10-CM | POA: Diagnosis not present

## 2021-01-02 DIAGNOSIS — Z9884 Bariatric surgery status: Secondary | ICD-10-CM

## 2021-01-02 DIAGNOSIS — R296 Repeated falls: Secondary | ICD-10-CM | POA: Diagnosis present

## 2021-01-02 HISTORY — PX: TEE WITHOUT CARDIOVERSION: SHX5443

## 2021-01-02 HISTORY — DX: Presence of other cardiac implants and grafts: Z95.818

## 2021-01-02 HISTORY — PX: LEFT ATRIAL APPENDAGE OCCLUSION: EP1229

## 2021-01-02 LAB — CBC
HCT: 40.6 % (ref 39.0–52.0)
Hemoglobin: 12.9 g/dL — ABNORMAL LOW (ref 13.0–17.0)
MCH: 31.5 pg (ref 26.0–34.0)
MCHC: 31.8 g/dL (ref 30.0–36.0)
MCV: 99 fL (ref 80.0–100.0)
Platelets: 160 10*3/uL (ref 150–400)
RBC: 4.1 MIL/uL — ABNORMAL LOW (ref 4.22–5.81)
RDW: 13.8 % (ref 11.5–15.5)
WBC: 4.2 10*3/uL (ref 4.0–10.5)
nRBC: 0 % (ref 0.0–0.2)

## 2021-01-02 LAB — TYPE AND SCREEN
ABO/RH(D): B POS
Antibody Screen: NEGATIVE

## 2021-01-02 LAB — POCT ACTIVATED CLOTTING TIME: Activated Clotting Time: 300 seconds

## 2021-01-02 LAB — ABO/RH: ABO/RH(D): B POS

## 2021-01-02 LAB — SURGICAL PCR SCREEN
MRSA, PCR: NEGATIVE
Staphylococcus aureus: NEGATIVE

## 2021-01-02 SURGERY — LEFT ATRIAL APPENDAGE OCCLUSION
Anesthesia: General

## 2021-01-02 MED ORDER — SUGAMMADEX SODIUM 200 MG/2ML IV SOLN
INTRAVENOUS | Status: DC | PRN
  Administered 2021-01-02: 200 mg via INTRAVENOUS

## 2021-01-02 MED ORDER — VANCOMYCIN HCL IN DEXTROSE 1-5 GM/200ML-% IV SOLN
1000.0000 mg | INTRAVENOUS | Status: AC
  Administered 2021-01-02: 1000 mg via INTRAVENOUS
  Filled 2021-01-02: qty 200

## 2021-01-02 MED ORDER — SODIUM CHLORIDE 0.9 % IV SOLN: INTRAVENOUS | Status: DC

## 2021-01-02 MED ORDER — PROTAMINE SULFATE 10 MG/ML IV SOLN
INTRAVENOUS | Status: DC | PRN
  Administered 2021-01-02: 20 mg via INTRAVENOUS
  Administered 2021-01-02: 10 mg via INTRAVENOUS

## 2021-01-02 MED ORDER — LACTATED RINGERS IV SOLN: INTRAVENOUS | Status: DC

## 2021-01-02 MED ORDER — IOHEXOL 350 MG/ML SOLN
INTRAVENOUS | Status: DC | PRN
  Administered 2021-01-02 (×3): 5 mL

## 2021-01-02 MED ORDER — LACTATED RINGERS IV SOLN: INTRAVENOUS | Status: DC | PRN

## 2021-01-02 MED ORDER — LIDOCAINE 2% (20 MG/ML) 5 ML SYRINGE
INTRAMUSCULAR | Status: DC | PRN
  Administered 2021-01-02: 60 mg via INTRAVENOUS

## 2021-01-02 MED ORDER — PANTOPRAZOLE SODIUM 40 MG PO TBEC
40.0000 mg | DELAYED_RELEASE_TABLET | Freq: Every day | ORAL | Status: DC
  Administered 2021-01-02 – 2021-01-03 (×2): 40 mg via ORAL
  Filled 2021-01-02 (×2): qty 1

## 2021-01-02 MED ORDER — SODIUM CHLORIDE 0.9% FLUSH
3.0000 mL | INTRAVENOUS | Status: DC | PRN
Start: 2021-01-02 — End: 2021-01-03

## 2021-01-02 MED ORDER — SODIUM CHLORIDE 0.9% FLUSH
3.0000 mL | Freq: Two times a day (BID) | INTRAVENOUS | Status: DC
  Administered 2021-01-02 – 2021-01-03 (×3): 3 mL via INTRAVENOUS

## 2021-01-02 MED ORDER — CHLORHEXIDINE GLUCONATE 0.12 % MT SOLN
OROMUCOSAL | Status: AC
  Administered 2021-01-02: 15 mL
  Filled 2021-01-02: qty 15

## 2021-01-02 MED ORDER — ROCURONIUM BROMIDE 10 MG/ML (PF) SYRINGE
PREFILLED_SYRINGE | INTRAVENOUS | Status: DC | PRN
  Administered 2021-01-02: 10 mg via INTRAVENOUS
  Administered 2021-01-02: 20 mg via INTRAVENOUS
  Administered 2021-01-02: 50 mg via INTRAVENOUS

## 2021-01-02 MED ORDER — APIXABAN 2.5 MG PO TABS
2.5000 mg | ORAL_TABLET | Freq: Two times a day (BID) | ORAL | Status: DC
  Administered 2021-01-02 – 2021-01-03 (×2): 2.5 mg via ORAL
  Filled 2021-01-02 (×3): qty 1

## 2021-01-02 MED ORDER — ACETAMINOPHEN 500 MG PO TABS
1000.0000 mg | ORAL_TABLET | Freq: Once | ORAL | Status: AC
  Administered 2021-01-02: 1000 mg via ORAL
  Filled 2021-01-02: qty 2

## 2021-01-02 MED ORDER — ASPIRIN EC 81 MG PO TBEC
81.0000 mg | DELAYED_RELEASE_TABLET | Freq: Every day | ORAL | Status: DC
  Administered 2021-01-02 – 2021-01-03 (×2): 81 mg via ORAL
  Filled 2021-01-02 (×2): qty 1

## 2021-01-02 MED ORDER — DOXEPIN HCL 10 MG PO CAPS
10.0000 mg | ORAL_CAPSULE | Freq: Every day | ORAL | Status: DC
  Administered 2021-01-02: 10 mg via ORAL
  Filled 2021-01-02: qty 1

## 2021-01-02 MED ORDER — ONDANSETRON HCL 4 MG/2ML IJ SOLN: 4.0000 mg | Freq: Four times a day (QID) | INTRAMUSCULAR | Status: DC | PRN

## 2021-01-02 MED ORDER — SODIUM CHLORIDE 0.9 % IV SOLN: 250.0000 mL | INTRAVENOUS | Status: DC | PRN

## 2021-01-02 MED ORDER — LIDOCAINE HCL (PF) 1 % IJ SOLN
INTRAMUSCULAR | Status: AC
  Filled 2021-01-02: qty 5

## 2021-01-02 MED ORDER — PANCRELIPASE (LIP-PROT-AMYL) 12000-38000 UNITS PO CPEP
36000.0000 [IU] | ORAL_CAPSULE | Freq: Three times a day (TID) | ORAL | Status: DC
  Administered 2021-01-02 – 2021-01-03 (×2): 36000 [IU] via ORAL
  Filled 2021-01-02 (×2): qty 3

## 2021-01-02 MED ORDER — HEPARIN (PORCINE) IN NACL 1000-0.9 UT/500ML-% IV SOLN
INTRAVENOUS | Status: DC | PRN
  Administered 2021-01-02: 500 mL

## 2021-01-02 MED ORDER — ACETAMINOPHEN 325 MG PO TABS
650.0000 mg | ORAL_TABLET | ORAL | Status: DC | PRN
  Administered 2021-01-02 – 2021-01-03 (×3): 650 mg via ORAL
  Filled 2021-01-02 (×3): qty 2

## 2021-01-02 MED ORDER — FENTANYL CITRATE (PF) 100 MCG/2ML IJ SOLN
INTRAMUSCULAR | Status: DC | PRN
  Administered 2021-01-02: 100 ug via INTRAVENOUS

## 2021-01-02 MED ORDER — DEXAMETHASONE SODIUM PHOSPHATE 10 MG/ML IJ SOLN
INTRAMUSCULAR | Status: DC | PRN
  Administered 2021-01-02: 10 mg via INTRAVENOUS

## 2021-01-02 MED ORDER — HEPARIN (PORCINE) IN NACL 2000-0.9 UNIT/L-% IV SOLN
INTRAVENOUS | Status: DC | PRN
  Administered 2021-01-02: 1000 mL

## 2021-01-02 MED ORDER — HEPARIN SODIUM (PORCINE) 1000 UNIT/ML IJ SOLN
INTRAMUSCULAR | Status: DC | PRN
  Administered 2021-01-02: 10000 [IU] via INTRAVENOUS

## 2021-01-02 MED ORDER — PROPOFOL 10 MG/ML IV BOLUS
INTRAVENOUS | Status: DC | PRN
  Administered 2021-01-02: 130 mg via INTRAVENOUS

## 2021-01-02 MED ORDER — BUPRENORPHINE HCL-NALOXONE HCL 2-0.5 MG SL SUBL
1.0000 | SUBLINGUAL_TABLET | Freq: Every day | SUBLINGUAL | Status: DC
  Administered 2021-01-02 – 2021-01-03 (×2): 1 via SUBLINGUAL
  Filled 2021-01-02 (×2): qty 1

## 2021-01-02 MED ORDER — DULOXETINE HCL 30 MG PO CPEP
30.0000 mg | ORAL_CAPSULE | Freq: Two times a day (BID) | ORAL | Status: DC
  Administered 2021-01-02 – 2021-01-03 (×2): 30 mg via ORAL
  Filled 2021-01-02 (×2): qty 1

## 2021-01-02 MED ORDER — ONDANSETRON HCL 4 MG/2ML IJ SOLN
INTRAMUSCULAR | Status: DC | PRN
  Administered 2021-01-02: 4 mg via INTRAVENOUS

## 2021-01-02 MED ORDER — PHENYLEPHRINE 40 MCG/ML (10ML) SYRINGE FOR IV PUSH (FOR BLOOD PRESSURE SUPPORT)
PREFILLED_SYRINGE | INTRAVENOUS | Status: DC | PRN
  Administered 2021-01-02 (×3): 40 ug via INTRAVENOUS

## 2021-01-02 SURGICAL SUPPLY — 23 items
CATH EXPO 5F MPA-1 (CATHETERS) ×1 IMPLANT
CATH INFINITI 5FR ANG PIGTAIL (CATHETERS) ×1 IMPLANT
CLOSURE PERCLOSE PROSTYLE (VASCULAR PRODUCTS) ×2 IMPLANT
DEVICE WATCHMAN FLX PROC (KITS) IMPLANT
DILATOR VESSEL 38 20CM 12FR (INTRODUCER) ×1 IMPLANT
DILATOR VESSEL 38 20CM 16FR (INTRODUCER) ×1 IMPLANT
KIT HEART LEFT (KITS) ×2 IMPLANT
KIT VERSACROSS LRG ACCESS (CATHETERS) ×1 IMPLANT
MAT PREVALON FULL STRYKER (MISCELLANEOUS) ×1 IMPLANT
PACK CARDIAC CATHETERIZATION (CUSTOM PROCEDURE TRAY) ×2 IMPLANT
PAD PRO RADIOLUCENT 2001M-C (PAD) ×2 IMPLANT
SHEATH PERFORMER 16FR 30 (SHEATH) ×1 IMPLANT
SHEATH PINNACLE 8F 10CM (SHEATH) ×1 IMPLANT
SHEATH PROBE COVER 6X72 (BAG) ×2 IMPLANT
SHIELD RADPAD SCOOP 12X17 (MISCELLANEOUS) ×2 IMPLANT
SYS WATCHMAN FXD DBL (SHEATH) ×2
SYSTEM WATCHMAN FXD DBL (SHEATH) IMPLANT
TRANSDUCER W/STOPCOCK (MISCELLANEOUS) ×2 IMPLANT
TUBING CIL FLEX 10 FLL-RA (TUBING) ×2 IMPLANT
WATCHMAN FLX 20 (Prosthesis & Implant Heart) ×1 IMPLANT
WATCHMAN FLX PROCEDURE DEVICE (KITS) ×2 IMPLANT
WATCHMAN PROCED TRUSEAL ACCESS (SHEATH) ×1 IMPLANT
WIRE HI TORQ VERSACORE-J 145CM (WIRE) ×1 IMPLANT

## 2021-01-02 NOTE — Interval H&P Note (Signed)
History and Physical Interval Note:  01/02/2021 7:21 AM  Jack Neal  has presented today for surgery, with the diagnosis of atrial fibrillation.  The various methods of treatment have been discussed with the patient and family. After consideration of risks, benefits and other options for treatment, the patient has consented to  Procedure(s): LEFT ATRIAL APPENDAGE OCCLUSION (N/A) TRANSESOPHAGEAL ECHOCARDIOGRAM (TEE) (N/A) as a surgical intervention.  The patient's history has been reviewed, patient examined, no change in status, stable for surgery.  I have reviewed the patient's chart and labs.  Questions were answered to the patient's satisfaction.     [name] T [name]

## 2021-01-02 NOTE — Op Note (Signed)
  [facility]   MULTIDISCIPLINARY HEART TEAM  Watchman LAAO Procedure   Surgeon: Steffanie Dunn, MD Co-Surgeon: Tonny Bollman, MD Anesthesia: Dr Sampson Goon Imager: Dr Royann Shivers   Procedure: 1) Transseptal Puncture 2) Watchman left atrial appendage occlusion 3) Perclose right femoral vein   Device Implant: 20 mm Watchman Flx Device   Background and Indication: 75 yo with permanent atrial fibrillation, prior intracranial bleeding (traumatic subdural), felt to be a poor candidate for long term anticoagulation. He is referred for the above procedure   Procedure description: US guidance is used for right femoral venous access. [place] images are stored digitally in the patient's chart. Double Preclose is performed at 10" and 2" positions using normal technique and a after progressively dilating the femoral vein over a Versacross wire, a 16 Fr sheath is inserted. Transseptal puncture is then performed after fully anticoagulating the patient with unfractionated heparin. A therapeutic ACT greater than 250 seconds is achieved. A Versacross system is used with RF energy to cross the interatrial septum under fluoroscopic and TEE guidance. Please see Dr Lovena Neighbours detailed report for further description of transseptal puncture and Watchman Access Sheath insertion.   Once the 14 Fr access sheath is in appropriate position in the left atrial appendage, a 20 mm Watchman Flex device is inserted and deployed. The appendage is shallow and with 3 deployments the mitral shoulder and device compression are not acceptable. The device is removed and the pigtail catheter is inserted through the sheath and repositioned deeper and more anterior in the appendage. Another 20 mm Watchman FLX device is inserted and deployed using normal technique. TEE images are reviewed and demonstrate appropriate position and compression in the left atrial appendage. Thorough TEE imaging is performed to evaluate all PASS  criteria prior to device release. After device release, the device remains in stable position with complete occlusion of the appendage. The sheath is removed from the body and the previously deployed perclose sutures are tightened for complete hemostasis.   Conclusion: Successful implantation of a 20 mm Watchman FLX left atrial appendage occlusion device using fluoroscopic and transesophageal echo guidance  Tonny Bollman 01/02/2021 9:46 AM

## 2021-01-02 NOTE — Discharge Summary (Signed)
[facility]   MULTIDISCIPLINARY HEART VALVE TEAM   STRUCTURAL HEART PROCEDURE DISCHARGE SUMMARY   Patient ID: Jack Neal,  MRN: [id number], DOB/AGE: 05/15/1945 75 y.o.  Admit date: 01/02/2021 Discharge date: 01/03/2021  Primary Care Physician: [facility]  Primary Cardiologist: Dr. Izora Ribas, MD  Electrophysiologist: Lanier Prude, MD  Primary Discharge Diagnosis:  Permanent Atrial Fibrillation Poor candidacy for long term anticoagulation due to h/o intracranial hemorrhage  Secondary Discharge Diagnosis:  -Depression -GERD -s/p gastric bypass -OSA on CPAP -PPM placement -PTSD  Procedures This Admission:  Transeptal Puncture Intra-procedural TEE which showed no LAA thrombus Left atrial appendage occlusive device placement on 01/02/21 by Dr. Lalla Brothers.  This study demonstrated:  Watchman LAAO Procedure 01/02/21:  Surgeon: Steffanie Dunn, MD Co-Surgeon: Tonny Bollman, MD Anesthesia: Dr Sampson Goon Imager: Dr Royann Shivers   Procedure: 1) Transseptal Puncture 2) Watchman left atrial appendage occlusion 3) Perclose right femoral vein   Device Implant: 20 mm Watchman Flx Device   Conclusion: Successful implantation of a 20 mm Watchman FLX left atrial appendage occlusion device using fluoroscopic and transesophageal echo guidance  Post Implant Anticoagulation Strategy: Eliquis 2.5mg  by mouth twice daily and Aspirin 81mg  by mouth daily for 45 days. If good seal and no device related thrombus on 45-day follow up TEE, transition to Aspirin 81mg  by mouth daily and Plavix 75mg  by mouth daily to complete 6 months of total therapy post implant.   Brief HPI: Jack Neal is a 75 y.o. male with a history of permanent atrial fibrillation with a CHA2DS2VASc of at least 2.  He was seen in outpatient setting by Dr. and was felt to be a poor candidate for prolonged anticoagulation due to history of intracranial hemorrhage. Risks, benefits, and  alternatives to left atrial appendage occlusive device placement device were reviewed with the patient who wished to proceed. The patient underwent intra-procedural TEE as above on 01/02/2021.  Hospital Course:  [name] was admitted and underwent left atrial appendage occlusive device placement with details as outlined above.  He were monitored on telemetry overnight which demonstrated AF with paced rhythm.  Groin was without complication on the day of discharge. The patient was examined and considered to be stable for discharge. Wound care and restrictions reviewed with the patient. He has been scheduled to see Lalla Brothers, NP in 1 month with plans for a repeat TEE in approximately 45 days post procedure to ensure proper seal of the device.  He will follow up with Dr. 01/04/2021 in 12 weeks for post closure follow up.   Post implant medication plan: Continue Eliquis 2.5mg  BID and ASA 81mg  QD for the first 45 days after implant. Will change to Plavix monotherapy at the 45 day mark and continue for at least 6 months. He will need SBE prophylaxis for 6 months after implant. Due to PCN allergy, will prescribe Azithromycin 500mg  PO as needed 1 hour prior to dental procedures or dental work.   Consult: None  The patient has been seen and examined by Dr. Pernell Dupre who feels that he is stable and ready for discharge today, 01/03/2021. Postprocedural follow-up has been made and instructions reviewed.  Vitals:   01/02/21 1935 01/02/21 2319 01/03/21 0440 01/03/21 0700  BP: 105/79 99/66 100/64 95/63  Pulse:  75 79 80  Resp:  16 20 19   Temp: 97.6 F (36.4 C) 98.1 F (36.7 C) (!) 97.5 F (36.4 C) 98.1 F (36.7 C)  TempSrc: Oral Oral Oral Oral  SpO2:  97% 95% 94%  Weight:      Height:       General: Well developed, well nourished, NAD Neck: Negative for carotid bruits. No JVD Lungs:Clear to ausculation bilaterally. No wheezes, rales, or rhonchi. Breathing is unlabored. Cardiovascular: Irregularly  irregular. No murmur Extremities: No edema. Neuro: Alert and oriented. No focal deficits. No facial asymmetry. MAE spontaneously. Psych: Responds to questions appropriately with normal affect.    Labs: Lab Results  Component Value Date   WBC 4.2 01/02/2021   HGB 12.9 (L) 01/02/2021   HCT 40.6 01/02/2021   MCV 99.0 01/02/2021   PLT 160 01/02/2021    Recent Labs  Lab 01/03/21 0258  NA 134*  K 4.7  CL 101  CO2 29  BUN 16  CREATININE 0.85  CALCIUM 8.6*  GLUCOSE 150*   Discharge Medications:  Allergies as of 01/03/2021       Reactions   Amphetamine Rash, Other (See Comments)   Altered mental status- amnesia (also)   Diazepam Rash   Other reaction(s): Amnesia, Mental Status Changes (intolerance), Other amnesia   Zoloft [sertraline] Anxiety, Rash, Other (See Comments)   Panic attacks and Cardiovascular Arrest (ALSO)   Gabapentin    Tremors/jerking, hallucinations, dry mouth   Gluten Meal Diarrhea   Penicillin G Rash        Medication List     STOP taking these medications    omeprazole 40 MG capsule Commonly known as: PRILOSEC Replaced by: pantoprazole 40 MG tablet       TAKE these medications    acetaminophen 500 MG tablet Commonly known as: TYLENOL Take 500 mg by mouth every 6 (six) hours as needed for moderate pain.   apixaban 2.5 MG Tabs tablet Commonly known as: ELIQUIS Take 1 tablet (2.5 mg total) by mouth 2 (two) times daily.   ARTIFICIAL TEARS OP Place 1 drop into both eyes 3 (three) times daily as needed (dry eyes).   aspirin EC 81 MG tablet Take 1 tablet (81 mg total) by mouth daily. Swallow whole.   azithromycin 500 MG tablet Commonly known as: Zithromax Take 1 tablet (500 mg total) by mouth as needed. Take 1 tablet by mouth 30-60 minutes prior to dental work/cleaning.   beta carotene 82423 UNIT capsule Take 25,000 Units by mouth daily.   bumetanide 2 MG tablet Commonly known as: BUMEX Take 4 mg by mouth daily as needed (edema).    buprenorphine-naloxone 2-0.5 mg Subl SL tablet Commonly known as: SUBOXONE Place 1 tablet under the tongue daily.   camphor-menthol lotion Commonly known as: SARNA Apply 1 application topically daily as needed for itching.   CREON PO Take 36,000 Units by mouth 3 (three) times daily with meals.   diclofenac Sodium 1 % Gel Commonly known as: VOLTAREN Apply 2 g topically 2 (two) times daily. For feet pain   doxepin 10 MG capsule Commonly known as: SINEQUAN Take 10 mg by mouth at bedtime.   DULoxetine 30 MG capsule Commonly known as: CYMBALTA Take 30 mg by mouth 2 (two) times daily.   ferrous sulfate 325 (65 FE) MG tablet Take 325 mg by mouth every Monday, Wednesday, and Friday.   guaifenesin 400 MG Tabs tablet Commonly known as: HUMIBID E Take 400 mg by mouth 2 (two) times daily.   hydrOXYzine 10 MG tablet Commonly known as: ATARAX/VISTARIL Take 1 tablet (10 mg total) by mouth 3 (three) times daily as needed for anxiety (Severe jerking/tremors.). What changed:  how much to take when to take this  lidocaine 5 % Commonly known as: LIDODERM Place 1 patch onto the skin daily as needed (For feet pain per patient).   Lidocaine HCl 4 % Crea Apply 1 g topically daily as needed (pain).   loperamide 2 MG tablet Commonly known as: IMODIUM A-D Take 2 mg by mouth 3 (three) times daily as needed for diarrhea or loose stools.   MAGNESIUM PO Take 375 mg by mouth daily.   multivitamin capsule Take 1 capsule by mouth daily.   pantoprazole 40 MG tablet Commonly known as: PROTONIX Take 1 tablet (40 mg total) by mouth daily. Start taking on: January 04, 2021 Replaces: omeprazole 40 MG capsule   Phenylephrine HCl 5 MG Tabs Take 5 mg by mouth 2 (two) times daily.   POTASSIUM PO Take 450 mg by mouth daily.   Probiotic Chew Chew 3 capsules by mouth daily.   Simethicone 180 MG Caps Take 180 mg by mouth daily as needed (gas).   sodium chloride 0.65 % Soln nasal  spray Commonly known as: OCEAN Place 1 spray into both nostrils as needed for congestion.   Sodium Fluoride 1.1 % Pste Place 1 application onto teeth 2 (two) times daily.   Tums Ultra 1000 400 MG chewable tablet Generic drug: calcium elemental as carbonate Chew 1,000 mg by mouth daily as needed for heartburn.   Vitamin D 50 MCG (2000 UT) tablet Take 2,000 Units by mouth 2 (two) times daily.       Follow up: Discharge Instructions     Call MD for:  difficulty breathing, headache or visual disturbances   Complete by: As directed    Call MD for:  extreme fatigue   Complete by: As directed    Call MD for:  hives   Complete by: As directed    Call MD for:  persistant dizziness or light-headedness   Complete by: As directed    Call MD for:  persistant nausea and vomiting   Complete by: As directed    Call MD for:  redness, tenderness, or signs of infection (pain, swelling, redness, odor or green/yellow discharge around incision site)   Complete by: As directed    Call MD for:  severe uncontrolled pain   Complete by: As directed    Call MD for:  temperature >100.4   Complete by: As directed    Diet - low sodium heart healthy   Complete by: As directed    Discharge instructions   Complete by: As directed    [facility] Procedure, Care After  Procedure MD: Dr. Isidoro Donning Clinical Coordinator: Karsten Fells RN   This sheet gives you information about how to care for yourself after your procedure. Your health care provider may also give you more specific instructions. If you have problems or questions, contact your health care provider.  What can I expect after the procedure? After the procedure, it is common to have: Bruising around your puncture site. Tenderness around your puncture site. Tiredness (fatigue).  Medication instructions It is very important to continue to take your blood thinner as directed by your doctor after the Watchman procedure. Call your procedure  doctor's office with question or concerns. If you are on Coumadin (warfarin), you will have your INR checked the week after your procedure, with a goal INR of 2.0 - 3.0. Please follow your medication instructions on your discharge summary. Only take the medications listed on your discharge paperwork.  Follow up You will be seen in 1 month after your procedure in  the [facility] You will have another TEE (Transesophageal Echocardiogram) approximately 6 weeks after your procedure mark to check your device You will follow up the MD/APP who performed your procedure 6 months after your procedure The Watchman Clinical Coordinator will check in with you from time to time, including 1 and 2 years after your procedure.    Follow these instructions at home: Puncture site care  Follow instructions from your health care provider about how to take care of your puncture site. Make sure you: If present, leave stitches (sutures), skin glue, or adhesive strips in place.  If a large square bandage is present, this may be removed 24 hours after surgery.  Check your puncture site every day for signs of infection. Check for: Redness, swelling, or pain. Fluid or blood. If your puncture site starts to bleed, lie down on your back, apply firm pressure to the area, and contact your health care provider. Warmth. Pus or a bad smell. Driving Do not drive yourself home if you received sedation Do not drive for at least 4 days after your procedure or however long your health care provider recommends. (Do not resume driving if you have previously been instructed not to drive for other health reasons.) Do not spend greater than 1 hour at a time in a car for the first 3 days. Stop and take a break with a 5 minute walk at least every hour.  Do not drive or use heavy machinery while taking prescription pain medicine.  Activity Avoid activities that take a lot of effort, including exercise, for at least 7  days after your procedure. For the first 3 days, avoid sitting for longer than one hour at a time.  Avoid alcoholic beverages, signing paperwork, or participating in legal proceedings for 24 hours after receiving sedation Do not lift anything that is heavier than 10 lb (4.5 kg) for one week.  No sexual activity for 1 week.  Return to your normal activities as told by your health care provider. Ask your health care provider what activities are safe for you. General instructions Take over-the-counter and prescription medicines only as told by your health care provider. Do not use any products that contain nicotine or tobacco, such as cigarettes and e-cigarettes. If you need help quitting, ask your health care provider. You may shower after 24 hours, but Do not take baths, swim, or use a hot tub for 1 week.  Do not drink alcohol for 24 hours after your procedure. Keep all follow-up visits as told by your health care provider. This is important. Dental Work: You will require antibiotics prior to any dental work, including cleanings, for 6 months after your Watchman implantation to help protect you from infection. After 6 months, antibiotics are no longer required. Contact a health care provider if: You have redness, mild swelling, or pain around your puncture site. You have soreness in your throat or at your puncture site that does not improve after several days You have fluid or blood coming from your puncture site that stops after applying firm pressure to the area. Your puncture site feels warm to the touch. You have pus or a bad smell coming from your puncture site. You have a fever. You have chest pain or discomfort that spreads to your neck, jaw, or arm. You are sweating a lot. You feel nauseous. You have a fast or irregular heartbeat. You have shortness of breath. You are dizzy or light-headed and feel the need to lie down.  You have pain or numbness in the arm or leg closest to your  puncture site. Get help right away if: Your puncture site suddenly swells. Your puncture site is bleeding and the bleeding does not stop after applying firm pressure to the area. These symptoms may represent a serious problem that is an emergency. Do not wait to see if the symptoms will go away. Get medical help right away. Call your local emergency services (911 in the [place] Do not drive yourself to the hospital. Summary After the procedure, it is normal to have bruising and tenderness at the puncture site in your groin, neck, or forearm. Check your puncture site every day for signs of infection. Get help right away if your puncture site is bleeding and the bleeding does not stop after applying firm pressure to the area. This is a medical emergency.  This information is not intended to replace advice given to you by your health care provider. Make sure you discuss any questions you have with your health care provider.  Discussed the use of prophylactic antibiotics before dental work and other surgeries. Prescription given for Clarithromycin or Azithromycin 500 mg orally one hour before procedure.   Increase activity slowly   Complete by: As directed        Follow-up Information     Filbert Schilder, NP. Go on 01/27/2021.   Specialty: Cardiology Why: at 10:30am. Please arrive at 10:15am. Contact information: [address] [place] [address] [phone]                Duration of Discharge Encounter: Greater than 30 minutes including physician time.  Signed, Georgie Chard, NP  01/03/2021 9:41 AM

## 2021-01-02 NOTE — Transfer of Care (Signed)
Immediate Anesthesia Transfer of Care Note  Patient: Jack Neal  Procedure(s) Performed: LEFT ATRIAL APPENDAGE OCCLUSION TRANSESOPHAGEAL ECHOCARDIOGRAM (TEE)  Patient Location: Cath Lab  Anesthesia Type:General  Level of Consciousness: awake, alert  and oriented  Airway & Oxygen Therapy: Patient Spontanous Breathing and Patient connected to nasal cannula oxygen  Post-op Assessment: Report given to RN, Post -op Vital signs reviewed and stable and Patient moving all extremities X 4  Post vital signs: Reviewed and stable  Last Vitals:  Vitals Value Taken Time  BP 137/79 01/02/21 0934  Temp    Pulse    Resp 22 01/02/21 0936  SpO2    Vitals shown include unvalidated device data.  Last Pain:  Vitals:   01/02/21 0606  TempSrc: Oral  PainSc: 3       Patients Stated Pain Goal: 1 (01/02/21 0606)  Complications: No notable events documented.

## 2021-01-02 NOTE — Anesthesia Postprocedure Evaluation (Signed)
Anesthesia Post Note  Patient: Jack Neal  Procedure(s) Performed: LEFT ATRIAL APPENDAGE OCCLUSION TRANSESOPHAGEAL ECHOCARDIOGRAM (TEE)     Patient location during evaluation: PACU Anesthesia Type: General Level of consciousness: awake and alert Pain management: pain level controlled Vital Signs Assessment: post-procedure vital signs reviewed and stable Respiratory status: spontaneous breathing, nonlabored ventilation and respiratory function stable Cardiovascular status: blood pressure returned to baseline and stable Postop Assessment: no apparent nausea or vomiting Anesthetic complications: no   No notable events documented.  Last Vitals:  Vitals:   01/02/21 1130 01/02/21 1200  BP: 115/70 110/65  Pulse: 80 81  Resp: 14 10  Temp:    SpO2: 95% 95%    Last Pain:  Vitals:   01/02/21 1043  TempSrc: Oral  PainSc: 0-No pain                 Renarda Mullinix,W. EDMOND

## 2021-01-02 NOTE — Progress Notes (Signed)
Echocardiogram Echocardiogram Transesophageal has been performed.  Warren Lacy Henok Heacock RDCS 01/02/2021, 9:26 AM

## 2021-01-02 NOTE — Anesthesia Procedure Notes (Signed)
Procedure Name: Intubation Date/Time: 01/02/2021 7:51 AM Performed by: Nils Pyle, CRNA Pre-anesthesia Checklist: Patient identified, Emergency Drugs available, Suction available and Patient being monitored Patient Re-evaluated:Patient Re-evaluated prior to induction Oxygen Delivery Method: Circle System Utilized Preoxygenation: Pre-oxygenation with 100% oxygen Induction Type: IV induction Ventilation: Mask ventilation without difficulty Laryngoscope Size: Miller and 2 Grade View: Grade I Tube type: Oral Tube size: 7.5 mm Number of attempts: 1 Airway Equipment and Method: Stylet and Oral airway Placement Confirmation: ETT inserted through vocal cords under direct vision, positive ETCO2 and breath sounds checked- equal and bilateral Secured at: 22 cm Tube secured with: Tape Dental Injury: Teeth and Oropharynx as per pre-operative assessment

## 2021-01-03 ENCOUNTER — Encounter (HOSPITAL_COMMUNITY): Payer: Self-pay | Admitting: Cardiology

## 2021-01-03 DIAGNOSIS — I4891 Unspecified atrial fibrillation: Secondary | ICD-10-CM | POA: Diagnosis not present

## 2021-01-03 DIAGNOSIS — Z95818 Presence of other cardiac implants and grafts: Secondary | ICD-10-CM | POA: Diagnosis not present

## 2021-01-03 LAB — BASIC METABOLIC PANEL
Anion gap: 4 — ABNORMAL LOW (ref 5–15)
BUN: 16 mg/dL (ref 8–23)
CO2: 29 mmol/L (ref 22–32)
Calcium: 8.6 mg/dL — ABNORMAL LOW (ref 8.9–10.3)
Chloride: 101 mmol/L (ref 98–111)
Creatinine, Ser: 0.85 mg/dL (ref 0.61–1.24)
GFR, Estimated: >60 mL/min (ref >60)
Glucose, Bld: 150 mg/dL — ABNORMAL HIGH (ref 70–99)
Potassium: 4.7 mmol/L (ref 3.5–5.1)
Sodium: 134 mmol/L — ABNORMAL LOW (ref 135–145)

## 2021-01-03 MED ORDER — AZITHROMYCIN 500 MG PO TABS: 500.0000 mg | ORAL_TABLET | ORAL | 0 refills | Status: DC | PRN

## 2021-01-03 MED ORDER — PANTOPRAZOLE SODIUM 40 MG PO TBEC: 40.0000 mg | DELAYED_RELEASE_TABLET | Freq: Every day | ORAL | 2 refills | Status: AC

## 2021-01-03 NOTE — Plan of Care (Signed)

## 2021-01-06 ENCOUNTER — Telehealth: Payer: Self-pay

## 2021-01-06 NOTE — Telephone Encounter (Signed)
  [facility]   Watchman Team  Contacted the patient regarding discharge from [facility] on 01/03/2021.  The patient understands to follow up with Georgie Chard, NP on 01/27/2021 at 10:30AM in preparation for TEE on 02/17/2021.  The patient understands discharge instructions? Yes  The patient understands medications and regimen? Yes. He is taking Eliquis and ASA as directed.  The patient reports groin sites look good with no S/S of infection or bleeding.  The patient reports he has some bruising on his hand that has appeared over the last couple days between his pointer finger and thumb that seems to be growing in size toward his wrist. He does not remember injuring the hand. He is going to have a nurse at Urgent Care look at his hand today and he will report back.   The patient understands to call with any questions or concerns prior to scheduled visit.

## 2021-01-06 NOTE — Telephone Encounter (Signed)
Attempted to contact patient for [facility] call.  Left message to call back.

## 2021-01-09 NOTE — Telephone Encounter (Signed)
The patient states he still has some slight bruising on his thumb but it is barely visible. He states he isn't worried about it.  He was grateful for follow-up.

## 2021-01-21 NOTE — Progress Notes (Signed)
[facility]                                      Cardiology Office Note:    Date:  01/27/2021   ID:  [name], DOB 10/19/1945, MRN [id number]  PCP:  [facility]  [facility] Cardiologist:  None  [facility] Electrophysiologist:  Vickie Epley, MD   Referring MD: [facility]   Chief Complaint  Patient presents with   Follow-up    Post Watchman implant    History of Present Illness:    Jack Neal is a 75 y.o. male with a hx of permanent atrial fibrillation s/p AVJ and PPM placement, hx of intracranial hemorrhage, HTN, GERD, hx of gastric bypass, depression, OSA on CPAP, PPM placement    Jack Neal was seen in outpatient setting by Dr. Quentin Ore 12/03/20 and felt to be a poor candidate for prolonged anticoagulation due to history of intracranial hemorrhage. Plan was to pursue Watchman implant, performed 01/02/21 with a 20 mm Watchman FLX left atrial appendage occlusion device using fluoroscopic and transesophageal echo guidance. Medication plan includes: Eliquis 2.5mg  BID and ASA 81mg  QD for the first 45 days after implant. Will change to Plavix monotherapy at the 45 day mark and continue for at least 6 months. He will need SBE prophylaxis for 6 months after implant. Due to PCN allergy, will prescribe Azithromycin 500mg  PO as needed 1 hour prior to dental procedures or dental work.   Today he presents with his family member and states that he has been doing well with no specific issues. He has tolerated the low dose Eliquis and ASA without any overt s/s of bleeding in stool or urine. TEE risks and benefits reviewed with the patient along with follow up medication plan. He denies chest pain, SOB, palpitations, LE edema, or orthopnea.   Past Medical History:  Diagnosis Date   AF (paroxysmal atrial fibrillation) ([name]) 10/01/2020   Atrial fibrillation ([name])    Celiac disease 10/01/2020   Chronic pain syndrome 10/01/2020   Depression    GERD  (gastroesophageal reflux disease)    History of gastric bypass 10/01/2020   Hypotension    Major depressive disorder 10/01/2020   Neuropathy    OSA on CPAP 10/01/2020   Pacemaker    Peripheral polyneuropathy 10/01/2020   Presence of cardiac pacemaker 10/01/2020   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   Watchman FLX 20   PTSD (post-traumatic stress disorder) 10/01/2020    Past Surgical History:  Procedure Laterality Date   LEFT ATRIAL APPENDAGE OCCLUSION N/A 01/02/2021   Procedure: LEFT ATRIAL APPENDAGE OCCLUSION;  Surgeon: Vickie Epley, MD;  Location: [place] [facility];  Service: Cardiovascular;  Laterality: N/A;   TEE WITHOUT CARDIOVERSION N/A 01/02/2021   Procedure: TRANSESOPHAGEAL ECHOCARDIOGRAM (TEE);  Surgeon: Vickie Epley, MD;  Location: [place] [facility];  Service: Cardiovascular;  Laterality: N/A;    Current Medications: Current Meds  Medication Sig   acetaminophen (TYLENOL) 500 MG tablet Take 500 mg by mouth every 6 (six) hours as needed for moderate pain.   apixaban (ELIQUIS) 2.5 MG TABS tablet Take 1 tablet (2.5 mg total) by mouth 2 (two) times daily.   aspirin EC 81 MG tablet Take 1 tablet (81 mg total) by mouth daily. Swallow whole.   azithromycin (ZITHROMAX) 500 MG tablet Take 1 tablet (500 mg total)  by mouth as needed. Take 1 tablet by mouth 30-60 minutes prior to dental work/cleaning.   bumetanide (BUMEX) 2 MG tablet Take 4 mg by mouth daily as needed (edema).   buprenorphine-naloxone (SUBOXONE) 2-0.5 mg SUBL SL tablet Place 1 tablet under the tongue daily.   calcium elemental as carbonate (TUMS ULTRA 1000) 400 MG chewable tablet Chew 1,000 mg by mouth daily as needed for heartburn.   camphor-menthol (SARNA) lotion Apply 1 application topically daily as needed for itching.   Carboxymethylcellulose Sodium (ARTIFICIAL TEARS OP) Place 1 drop into both eyes 3 (three) times daily as needed (dry eyes).   Cholecalciferol (VITAMIN  D) 50 MCG (2000 UT) tablet Take 2,000 Units by mouth 2 (two) times daily.   diclofenac Sodium (VOLTAREN) 1 % GEL Apply 2 g topically 2 (two) times daily. For feet pain   doxepin (SINEQUAN) 10 MG capsule Take 10 mg by mouth at bedtime.   DULoxetine (CYMBALTA) 30 MG capsule Take 30 mg by mouth 2 (two) times daily.   ferrous sulfate 325 (65 FE) MG tablet Take 325 mg by mouth every Monday, Wednesday, and Friday.   hydrOXYzine (ATARAX/VISTARIL) 10 MG tablet Take 1 tablet (10 mg total) by mouth 3 (three) times daily as needed for anxiety (Severe jerking/tremors.). (Patient taking differently: Take 20 mg by mouth at bedtime.)   lidocaine (LIDODERM) 5 % Place 1 patch onto the skin daily as needed (For feet pain per patient).   Lidocaine HCl 4 % CREA Apply 1 g topically daily as needed (pain).   loperamide (IMODIUM A-D) 2 MG tablet Take 2 mg by mouth 3 (three) times daily as needed for diarrhea or loose stools.   MAGNESIUM PO Take 375 mg by mouth daily.   Multiple Vitamin (MULTIVITAMIN) capsule Take 1 capsule by mouth daily.   Pancrelipase, Lip-Prot-Amyl, (CREON PO) Take 36,000 Units by mouth 3 (three) times daily with meals.   pantoprazole (PROTONIX) 40 MG tablet Take 1 tablet (40 mg total) by mouth daily.   POTASSIUM PO Take 450 mg by mouth daily.   Probiotic CHEW Chew 3 capsules by mouth daily.   Simethicone 180 MG CAPS Take 180 mg by mouth daily as needed (gas).   sodium chloride (OCEAN) 0.65 % SOLN nasal spray Place 1 spray into both nostrils as needed for congestion.   Sodium Fluoride 1.1 % PSTE Place 1 application onto teeth 2 (two) times daily.     Allergies:   Amphetamine, Diazepam, Zoloft [sertraline], Gabapentin, Gluten meal, and Penicillin g   Social History   Socioeconomic History   Marital status: Married    Spouse name: Not on file   Number of children: 3   Years of education: Not on file   Highest education level: Not on file  Occupational History   Occupation: Retired  Tobacco  Use   Smoking status: Former    Types: Cigarettes   Smokeless tobacco: Never  Substance and Sexual Activity   Alcohol use: Not Currently   Drug use: Never   Sexual activity: Yes  Other Topics Concern   Not on file  Social History Narrative   Not on file   Social Determinants of Health   Financial Resource Strain: Not on file  Food Insecurity: Not on file  Transportation Needs: Not on file  Physical Activity: Not on file  Stress: Not on file  Social Connections: Not on file     Family History: The patient's family history includes Brain cancer in his sister; Heart attack in  his mother; Ulcers in his father. There is no history of Heart disease.  ROS:   Please see the history of present illness.    All other systems reviewed and are negative.  EKGs/Labs/Other Studies Reviewed:    The following studies were reviewed today:  Watchman LAAO Procedure 01/02/21:   Surgeon: Steffanie Dunn, MD Co-Surgeon: Tonny Bollman, MD Anesthesia: Dr Sampson Goon Imager: Dr Royann Shivers   Procedure: 1) Transseptal Puncture 2) Watchman left atrial appendage occlusion 3) Perclose right femoral vein   Device Implant: 20 mm Watchman Flx Device   Conclusion: Successful implantation of a 20 mm Watchman FLX left atrial appendage occlusion device using fluoroscopic and transesophageal echo guidance  Post Implant Anticoagulation Strategy: Eliquis 2.5mg  by mouth twice daily and Aspirin 81mg  by mouth daily for 45 days. If good seal and no device related thrombus on 45-day follow up TEE, transition to Aspirin 81mg  by mouth daily and Plavix 75mg  by mouth daily to complete 6 months of total therapy post implant.   EKG:  EKG is ordered today.  The ekg ordered today demonstrates AF with V pacing, HR 83bpm.   Recent Labs: 10/04/2020: TSH 1.543 11/09/2020: Magnesium 1.9 11/11/2020: ALT 18 01/02/2021: Hemoglobin 12.9; Platelets 160 01/03/2021: BUN 16; Creatinine, Ser 0.85; Potassium 4.7; Sodium 134   Recent Lipid Panel    Component Value Date/Time   CHOL 108 10/02/2020 0301   TRIG 27 10/02/2020 0301   HDL 48 10/02/2020 0301   CHOLHDL 2.3 10/02/2020 0301   VLDL 5 10/02/2020 0301   LDLCALC 55 10/02/2020 0301    Physical Exam:    VS:  BP 116/62   Pulse 83   Ht 5\' 11"  (1.803 m)   Wt 175 lb (79.4 kg)   SpO2 99%   BMI 24.41 kg/m     Wt Readings from Last 3 Encounters:  01/27/21 175 lb (79.4 kg)  01/02/21 167 lb (75.8 kg)  12/23/20 167 lb (75.8 kg)    General: Well developed, well nourished, NAD Lungs:Clear to ausculation bilaterally. No wheezes, rales, or rhonchi. Breathing is unlabored. Cardiovascular: Irregularly irregular. + murmur Extremities: Trace edema. Neuro: Alert and oriented. No focal deficits. No facial asymmetry. MAE spontaneously. Psych: Responds to questions appropriately with normal affect.    ASSESSMENT/PLAN:    1. Atrial fibrillation: performed 01/02/21 with a 20 mm Watchman FLX left atrial appendage occlusion device using fluoroscopic and transesophageal echo guidance. Medication plan includes: Eliquis 2.5mg  BID and ASA 81mg  QD for the first 45 days after implant. Will change to Plavix monotherapy at the 45 day mark if TEE shows adequate device seal. Should continue Plavix and SBE until 06/2021. Rx previoulsy given for Azithromycin 500mg  PO as needed 1 hour prior to dental procedures or dental work due to PCN allergy.   2. Hx of PPM placement: Stable  3. MVP: Follow with seral imaging   Medication Adjustments/Labs and Tests Ordered: Current medicines are reviewed at length with the patient today.  Concerns regarding medicines are outlined above.  Orders Placed This Encounter  Procedures   Basic metabolic panel   CBC   EKG 12-Lead   No orders of the defined types were placed in this encounter.   Patient Instructions  Medication Instructions:  Your physician recommends that you continue on your current medications as directed. Please refer to the  Current Medication list given to you today.  *If you need a refill on your cardiac medications before your next appointment, please call your pharmacy*   Lab Work: TODAY: CBC,  BMET If you have labs (blood work) drawn today and your tests are completely normal, you will receive your results only by: [place] (if you have MyChart) OR A paper copy in the mail If you have any lab test that is abnormal or we need to change your treatment, we will call you to review the results.   Testing/Procedures: Your physician has requested that you have a TEE. During a TEE, sound waves are used to create images of your heart. It provides your doctor with information about the size and shape of your heart and how well your heart's chambers and valves are working. In this test, a transducer is attached to the end of a flexible tube that's guided down your throat and into your esophagus (the tube leading from you mouth to your stomach) to get a more detailed image of your heart. You are not awake for the procedure. Please see the instruction sheet given to you today. For further information please visit [email or web address].   Follow-Up: At [facility], you and your health needs are our priority.  As part of our continuing mission to provide you with exceptional heart care, we have created designated Provider Care Teams.  These Care Teams include your primary Cardiologist (physician) and Advanced Practice Providers (APPs -  Physician Assistants and Nurse Practitioners) who all work together to provide you with the care you need, when you need it.  We recommend signing up for the patient portal called "MyChart".  Sign up information is provided on this After Visit Summary.  MyChart is used to connect with patients for Virtual Visits (Telemedicine).  Patients are able to view lab/test results, encounter notes, upcoming appointments, etc.  Non-urgent messages can be sent to your provider as well.   To learn  more about what you can do with MyChart, go to [email or web address].    Your next appointment:   YOU WILL GET A CALL FROM THE STRUCTURAL TEAM TO SET UP A FOLLOW-UP APPOINTMENT.  Other Instructions  You are scheduled for A on 02/17/21 with Dr. Audie Box.  Please arrive at the [facility] (Main Entrance A) at [facility]: [address] [place], [place] [address] at 8 am. (1 hour prior to procedure unless lab work is needed; if lab work is needed arrive 1.5 hours ahead)  DIET: Nothing to eat or drink after midnight except a sip of water with medications (see medication instructions below)  FYI: For your safety, and to allow [place] to monitor your vital signs accurately during the surgery/procedure we request that   if you have artificial nails, gel coating, SNS etc. Please have those removed prior to your surgery/procedure. Not having the nail coverings /polish removed may result in cancellation or delay of your surgery/procedure.   Medication Instructions:   Continue your anticoagulant: ELIQUIS You will need to continue your anticoagulant after your procedure until you  are told by your  Provider that it is safe to stop   Come to: Come to the lab at [facility] between the hours of 8:00 am and 4:30 pm.You do not have to be fasting. APPOINTMENT: November 28TH   You must have a responsible person to drive you home and stay in the waiting area during your procedure. Failure to do so could result in cancellation.  Bring your insurance cards.  *Special Note: Every effort is made to have your procedure done on time. Occasionally there are emergencies that occur at the hospital that may cause delays.  Please be patient if a delay does occur.      Signed, Kathyrn Drown, NP  01/27/2021 10:54 AM    [place]

## 2021-01-21 NOTE — H&P (View-Only) (Signed)
[facility]                                      Cardiology Office Note:    Date:  01/27/2021   ID:  [name], DOB 1946-03-14, MRN [id number]  PCP:  [facility]  [facility] Cardiologist:  None  [facility] Electrophysiologist:  Vickie Epley, MD   Referring MD: [facility]   Chief Complaint  Patient presents with   Follow-up    Post Watchman implant    History of Present Illness:    Lyndle Virgen is a 75 y.o. male with a hx of permanent atrial fibrillation s/p AVJ and PPM placement, hx of intracranial hemorrhage, HTN, GERD, hx of gastric bypass, depression, OSA on CPAP, PPM placement    Mr. Bea was seen in outpatient setting by Dr. Quentin Ore 12/03/20 and felt to be a poor candidate for prolonged anticoagulation due to history of intracranial hemorrhage. Plan was to pursue Watchman implant, performed 01/02/21 with a 20 mm Watchman FLX left atrial appendage occlusion device using fluoroscopic and transesophageal echo guidance. Medication plan includes: Eliquis 2.5mg  BID and ASA 81mg  QD for the first 45 days after implant. Will change to Plavix monotherapy at the 45 day mark and continue for at least 6 months. He will need SBE prophylaxis for 6 months after implant. Due to PCN allergy, will prescribe Azithromycin 500mg  PO as needed 1 hour prior to dental procedures or dental work.   Today he presents with his family member and states that he has been doing well with no specific issues. He has tolerated the low dose Eliquis and ASA without any overt s/s of bleeding in stool or urine. TEE risks and benefits reviewed with the patient along with follow up medication plan. He denies chest pain, SOB, palpitations, LE edema, or orthopnea.   Past Medical History:  Diagnosis Date   AF (paroxysmal atrial fibrillation) ([name]) 10/01/2020   Atrial fibrillation ([name])    Celiac disease 10/01/2020   Chronic pain syndrome 10/01/2020   Depression    GERD  (gastroesophageal reflux disease)    History of gastric bypass 10/01/2020   Hypotension    Major depressive disorder 10/01/2020   Neuropathy    OSA on CPAP 10/01/2020   Pacemaker    Peripheral polyneuropathy 10/01/2020   Presence of cardiac pacemaker 10/01/2020   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   Watchman FLX 20   PTSD (post-traumatic stress disorder) 10/01/2020    Past Surgical History:  Procedure Laterality Date   LEFT ATRIAL APPENDAGE OCCLUSION N/A 01/02/2021   Procedure: LEFT ATRIAL APPENDAGE OCCLUSION;  Surgeon: Vickie Epley, MD;  Location: [place] [facility];  Service: Cardiovascular;  Laterality: N/A;   TEE WITHOUT CARDIOVERSION N/A 01/02/2021   Procedure: TRANSESOPHAGEAL ECHOCARDIOGRAM (TEE);  Surgeon: Vickie Epley, MD;  Location: [place] [facility];  Service: Cardiovascular;  Laterality: N/A;    Current Medications: Current Meds  Medication Sig   acetaminophen (TYLENOL) 500 MG tablet Take 500 mg by mouth every 6 (six) hours as needed for moderate pain.   apixaban (ELIQUIS) 2.5 MG TABS tablet Take 1 tablet (2.5 mg total) by mouth 2 (two) times daily.   aspirin EC 81 MG tablet Take 1 tablet (81 mg total) by mouth daily. Swallow whole.   azithromycin (ZITHROMAX) 500 MG tablet Take 1 tablet (500 mg total)  by mouth as needed. Take 1 tablet by mouth 30-60 minutes prior to dental work/cleaning.   bumetanide (BUMEX) 2 MG tablet Take 4 mg by mouth daily as needed (edema).   buprenorphine-naloxone (SUBOXONE) 2-0.5 mg SUBL SL tablet Place 1 tablet under the tongue daily.   calcium elemental as carbonate (TUMS ULTRA 1000) 400 MG chewable tablet Chew 1,000 mg by mouth daily as needed for heartburn.   camphor-menthol (SARNA) lotion Apply 1 application topically daily as needed for itching.   Carboxymethylcellulose Sodium (ARTIFICIAL TEARS OP) Place 1 drop into both eyes 3 (three) times daily as needed (dry eyes).   Cholecalciferol (VITAMIN  D) 50 MCG (2000 UT) tablet Take 2,000 Units by mouth 2 (two) times daily.   diclofenac Sodium (VOLTAREN) 1 % GEL Apply 2 g topically 2 (two) times daily. For feet pain   doxepin (SINEQUAN) 10 MG capsule Take 10 mg by mouth at bedtime.   DULoxetine (CYMBALTA) 30 MG capsule Take 30 mg by mouth 2 (two) times daily.   ferrous sulfate 325 (65 FE) MG tablet Take 325 mg by mouth every Monday, Wednesday, and Friday.   hydrOXYzine (ATARAX/VISTARIL) 10 MG tablet Take 1 tablet (10 mg total) by mouth 3 (three) times daily as needed for anxiety (Severe jerking/tremors.). (Patient taking differently: Take 20 mg by mouth at bedtime.)   lidocaine (LIDODERM) 5 % Place 1 patch onto the skin daily as needed (For feet pain per patient).   Lidocaine HCl 4 % CREA Apply 1 g topically daily as needed (pain).   loperamide (IMODIUM A-D) 2 MG tablet Take 2 mg by mouth 3 (three) times daily as needed for diarrhea or loose stools.   MAGNESIUM PO Take 375 mg by mouth daily.   Multiple Vitamin (MULTIVITAMIN) capsule Take 1 capsule by mouth daily.   Pancrelipase, Lip-Prot-Amyl, (CREON PO) Take 36,000 Units by mouth 3 (three) times daily with meals.   pantoprazole (PROTONIX) 40 MG tablet Take 1 tablet (40 mg total) by mouth daily.   POTASSIUM PO Take 450 mg by mouth daily.   Probiotic CHEW Chew 3 capsules by mouth daily.   Simethicone 180 MG CAPS Take 180 mg by mouth daily as needed (gas).   sodium chloride (OCEAN) 0.65 % SOLN nasal spray Place 1 spray into both nostrils as needed for congestion.   Sodium Fluoride 1.1 % PSTE Place 1 application onto teeth 2 (two) times daily.     Allergies:   Amphetamine, Diazepam, Zoloft [sertraline], Gabapentin, Gluten meal, and Penicillin g   Social History   Socioeconomic History   Marital status: Married    Spouse name: Not on file   Number of children: 3   Years of education: Not on file   Highest education level: Not on file  Occupational History   Occupation: Retired  Tobacco  Use   Smoking status: Former    Types: Cigarettes   Smokeless tobacco: Never  Substance and Sexual Activity   Alcohol use: Not Currently   Drug use: Never   Sexual activity: Yes  Other Topics Concern   Not on file  Social History Narrative   Not on file   Social Determinants of Health   Financial Resource Strain: Not on file  Food Insecurity: Not on file  Transportation Needs: Not on file  Physical Activity: Not on file  Stress: Not on file  Social Connections: Not on file     Family History: The patient's family history includes Brain cancer in his sister; Heart attack in  his mother; Ulcers in his father. There is no history of Heart disease.  ROS:   Please see the history of present illness.    All other systems reviewed and are negative.  EKGs/Labs/Other Studies Reviewed:    The following studies were reviewed today:  Watchman LAAO Procedure 01/02/21:   Surgeon: Steffanie Dunn, MD Co-Surgeon: Tonny Bollman, MD Anesthesia: Dr Sampson Goon Imager: Dr Royann Shivers   Procedure: 1) Transseptal Puncture 2) Watchman left atrial appendage occlusion 3) Perclose right femoral vein   Device Implant: 20 mm Watchman Flx Device   Conclusion: Successful implantation of a 20 mm Watchman FLX left atrial appendage occlusion device using fluoroscopic and transesophageal echo guidance  Post Implant Anticoagulation Strategy: Eliquis 2.5mg  by mouth twice daily and Aspirin 81mg  by mouth daily for 45 days. If good seal and no device related thrombus on 45-day follow up TEE, transition to Aspirin 81mg  by mouth daily and Plavix 75mg  by mouth daily to complete 6 months of total therapy post implant.   EKG:  EKG is ordered today.  The ekg ordered today demonstrates AF with V pacing, HR 83bpm.   Recent Labs: 10/04/2020: TSH 1.543 11/09/2020: Magnesium 1.9 11/11/2020: ALT 18 01/02/2021: Hemoglobin 12.9; Platelets 160 01/03/2021: BUN 16; Creatinine, Ser 0.85; Potassium 4.7; Sodium 134   Recent Lipid Panel    Component Value Date/Time   CHOL 108 10/02/2020 0301   TRIG 27 10/02/2020 0301   HDL 48 10/02/2020 0301   CHOLHDL 2.3 10/02/2020 0301   VLDL 5 10/02/2020 0301   LDLCALC 55 10/02/2020 0301    Physical Exam:    VS:  BP 116/62   Pulse 83   Ht 5\' 11"  (1.803 m)   Wt 175 lb (79.4 kg)   SpO2 99%   BMI 24.41 kg/m     Wt Readings from Last 3 Encounters:  01/27/21 175 lb (79.4 kg)  01/02/21 167 lb (75.8 kg)  12/23/20 167 lb (75.8 kg)    General: Well developed, well nourished, NAD Lungs:Clear to ausculation bilaterally. No wheezes, rales, or rhonchi. Breathing is unlabored. Cardiovascular: Irregularly irregular. + murmur Extremities: Trace edema. Neuro: Alert and oriented. No focal deficits. No facial asymmetry. MAE spontaneously. Psych: Responds to questions appropriately with normal affect.    ASSESSMENT/PLAN:    1. Atrial fibrillation: performed 01/02/21 with a 20 mm Watchman FLX left atrial appendage occlusion device using fluoroscopic and transesophageal echo guidance. Medication plan includes: Eliquis 2.5mg  BID and ASA 81mg  QD for the first 45 days after implant. Will change to Plavix monotherapy at the 45 day mark if TEE shows adequate device seal. Should continue Plavix and SBE until 06/2021. Rx previoulsy given for Azithromycin 500mg  PO as needed 1 hour prior to dental procedures or dental work due to PCN allergy.   2. Hx of PPM placement: Stable  3. MVP: Follow with seral imaging   Medication Adjustments/Labs and Tests Ordered: Current medicines are reviewed at length with the patient today.  Concerns regarding medicines are outlined above.  Orders Placed This Encounter  Procedures   Basic metabolic panel   CBC   EKG 12-Lead   No orders of the defined types were placed in this encounter.   Patient Instructions  Medication Instructions:  Your physician recommends that you continue on your current medications as directed. Please refer to the  Current Medication list given to you today.  *If you need a refill on your cardiac medications before your next appointment, please call your pharmacy*   Lab Work: TODAY: CBC,  BMET If you have labs (blood work) drawn today and your tests are completely normal, you will receive your results only by: [place] (if you have MyChart) OR A paper copy in the mail If you have any lab test that is abnormal or we need to change your treatment, we will call you to review the results.   Testing/Procedures: Your physician has requested that you have a TEE. During a TEE, sound waves are used to create images of your heart. It provides your doctor with information about the size and shape of your heart and how well your heart's chambers and valves are working. In this test, a transducer is attached to the end of a flexible tube that's guided down your throat and into your esophagus (the tube leading from you mouth to your stomach) to get a more detailed image of your heart. You are not awake for the procedure. Please see the instruction sheet given to you today. For further information please visit [email or web address].   Follow-Up: At [facility], you and your health needs are our priority.  As part of our continuing mission to provide you with exceptional heart care, we have created designated Provider Care Teams.  These Care Teams include your primary Cardiologist (physician) and Advanced Practice Providers (APPs -  Physician Assistants and Nurse Practitioners) who all work together to provide you with the care you need, when you need it.  We recommend signing up for the patient portal called "MyChart".  Sign up information is provided on this After Visit Summary.  MyChart is used to connect with patients for Virtual Visits (Telemedicine).  Patients are able to view lab/test results, encounter notes, upcoming appointments, etc.  Non-urgent messages can be sent to your provider as well.   To learn  more about what you can do with MyChart, go to [email or web address].    Your next appointment:   YOU WILL GET A CALL FROM THE STRUCTURAL TEAM TO SET UP A FOLLOW-UP APPOINTMENT.  Other Instructions  You are scheduled for A on 02/17/21 with Dr. Audie Box.  Please arrive at the [facility] (Main Entrance A) at [facility]: [address] [place], [place] [address] at 8 am. (1 hour prior to procedure unless lab work is needed; if lab work is needed arrive 1.5 hours ahead)  DIET: Nothing to eat or drink after midnight except a sip of water with medications (see medication instructions below)  FYI: For your safety, and to allow [place] to monitor your vital signs accurately during the surgery/procedure we request that   if you have artificial nails, gel coating, SNS etc. Please have those removed prior to your surgery/procedure. Not having the nail coverings /polish removed may result in cancellation or delay of your surgery/procedure.   Medication Instructions:   Continue your anticoagulant: ELIQUIS You will need to continue your anticoagulant after your procedure until you  are told by your  Provider that it is safe to stop   Come to: Come to the lab at [facility] between the hours of 8:00 am and 4:30 pm.You do not have to be fasting. APPOINTMENT: November 28TH   You must have a responsible person to drive you home and stay in the waiting area during your procedure. Failure to do so could result in cancellation.  Bring your insurance cards.  *Special Note: Every effort is made to have your procedure done on time. Occasionally there are emergencies that occur at the hospital that may cause delays.  Please be patient if a delay does occur.      Signed, Kathyrn Drown, NP  01/27/2021 10:54 AM    [place]

## 2021-01-27 ENCOUNTER — Other Ambulatory Visit: Payer: Self-pay

## 2021-01-27 ENCOUNTER — Ambulatory Visit (INDEPENDENT_AMBULATORY_CARE_PROVIDER_SITE_OTHER): Payer: No Typology Code available for payment source | Admitting: Cardiology

## 2021-01-27 ENCOUNTER — Encounter: Payer: Self-pay | Admitting: Cardiology

## 2021-01-27 VITALS — BP 116/62 | HR 83 | Ht 71.0 in | Wt 175.0 lb

## 2021-01-27 DIAGNOSIS — I341 Nonrheumatic mitral (valve) prolapse: Secondary | ICD-10-CM

## 2021-01-27 DIAGNOSIS — Z95818 Presence of other cardiac implants and grafts: Secondary | ICD-10-CM | POA: Diagnosis not present

## 2021-01-27 DIAGNOSIS — I4821 Permanent atrial fibrillation: Secondary | ICD-10-CM

## 2021-01-27 DIAGNOSIS — Z950 Presence of cardiac pacemaker: Secondary | ICD-10-CM

## 2021-01-27 NOTE — Patient Instructions (Addendum)
Medication Instructions:  Your physician recommends that you continue on your current medications as directed. Please refer to the Current Medication list given to you today.  *If you need a refill on your cardiac medications before your next appointment, please call your pharmacy*   Lab Work: TO BE DONE ON NOV 28TH: CBC, BMET If you have labs (blood work) drawn today and your tests are completely normal, you will receive your results only by: [place] (if you have MyChart) OR A paper copy in the mail If you have any lab test that is abnormal or we need to change your treatment, we will call you to review the results.   Testing/Procedures: Your physician has requested that you have a TEE. During a TEE, sound waves are used to create images of your heart. It provides your doctor with information about the size and shape of your heart and how well your heart's chambers and valves are working. In this test, a transducer is attached to the end of a flexible tube that's guided down your throat and into your esophagus (the tube leading from you mouth to your stomach) to get a more detailed image of your heart. You are not awake for the procedure. Please see the instruction sheet given to you today. For further information please visit [email or web address].   Follow-Up: At [facility], you and your health needs are our priority.  As part of our continuing mission to provide you with exceptional heart care, we have created designated Provider Care Teams.  These Care Teams include your primary Cardiologist (physician) and Advanced Practice Providers (APPs -  Physician Assistants and Nurse Practitioners) who all work together to provide you with the care you need, when you need it.  We recommend signing up for the patient portal called "MyChart".  Sign up information is provided on this After Visit Summary.  MyChart is used to connect with patients for Virtual Visits (Telemedicine).  Patients are  able to view lab/test results, encounter notes, upcoming appointments, etc.  Non-urgent messages can be sent to your provider as well.   To learn more about what you can do with MyChart, go to [email or web address].    Your next appointment:   YOU WILL GET A CALL FROM THE STRUCTURAL TEAM TO SET UP A FOLLOW-UP APPOINTMENT.  Other Instructions  You are scheduled for A on 02/17/21 with Dr. Audie Box.  Please arrive at the [facility] (Main Entrance A) at [facility]: [address] [place], [place] [address] at 8 am. (1 hour prior to procedure unless lab work is needed; if lab work is needed arrive 1.5 hours ahead)  DIET: Nothing to eat or drink after midnight except a sip of water with medications (see medication instructions below)  FYI: For your safety, and to allow [place] to monitor your vital signs accurately during the surgery/procedure we request that   if you have artificial nails, gel coating, SNS etc. Please have those removed prior to your surgery/procedure. Not having the nail coverings /polish removed may result in cancellation or delay of your surgery/procedure.   Medication Instructions:   Continue your anticoagulant: ELIQUIS You will need to continue your anticoagulant after your procedure until you  are told by your  Provider that it is safe to stop   Come to: Come to the lab at [facility] between the hours of 8:00 am and 4:30 pm.You do not have to be fasting. APPOINTMENT: November 28TH   You must have a  responsible person to drive you home and stay in the waiting area during your procedure. Failure to do so could result in cancellation.  Bring your insurance cards.  *Special Note: Every effort is made to have your procedure done on time. Occasionally there are emergencies that occur at the hospital that may cause delays. Please be patient if a delay does occur.

## 2021-02-03 ENCOUNTER — Encounter (HOSPITAL_COMMUNITY): Payer: Self-pay | Admitting: Cardiovascular Disease

## 2021-02-04 NOTE — Progress Notes (Signed)
Attempted to obtain medical history via telephone, unable to reach at this time. I left a voicemail to return pre surgical testing department's phone call.  

## 2021-02-10 ENCOUNTER — Other Ambulatory Visit: Payer: No Typology Code available for payment source | Admitting: *Deleted

## 2021-02-10 ENCOUNTER — Other Ambulatory Visit: Payer: Self-pay

## 2021-02-10 DIAGNOSIS — Z95818 Presence of other cardiac implants and grafts: Secondary | ICD-10-CM

## 2021-02-10 DIAGNOSIS — Z950 Presence of cardiac pacemaker: Secondary | ICD-10-CM

## 2021-02-10 DIAGNOSIS — I341 Nonrheumatic mitral (valve) prolapse: Secondary | ICD-10-CM

## 2021-02-10 DIAGNOSIS — I4821 Permanent atrial fibrillation: Secondary | ICD-10-CM

## 2021-02-11 LAB — CBC
Hematocrit: 36.1 % — ABNORMAL LOW (ref 37.5–51.0)
Hemoglobin: 12 g/dL — ABNORMAL LOW (ref 13.0–17.7)
MCH: 32.1 pg (ref 26.6–33.0)
MCHC: 33.2 g/dL (ref 31.5–35.7)
MCV: 97 fL (ref 79–97)
Platelets: 182 10*3/uL (ref 150–450)
RBC: 3.74 x10E6/uL — ABNORMAL LOW (ref 4.14–5.80)
RDW: 13.3 % (ref 11.6–15.4)
WBC: 4.3 10*3/uL (ref 3.4–10.8)

## 2021-02-11 LAB — BASIC METABOLIC PANEL
BUN/Creatinine Ratio: 23 (ref 10–24)
BUN: 17 mg/dL (ref 8–27)
CO2: 26 mmol/L (ref 20–29)
Calcium: 9 mg/dL (ref 8.6–10.2)
Chloride: 103 mmol/L (ref 96–106)
Creatinine, Ser: 0.73 mg/dL — ABNORMAL LOW (ref 0.76–1.27)
Glucose: 64 mg/dL — ABNORMAL LOW (ref 70–99)
Potassium: 4.8 mmol/L (ref 3.5–5.2)
Sodium: 142 mmol/L (ref 134–144)
eGFR: 95 mL/min/{1.73_m2} (ref >59)

## 2021-02-17 ENCOUNTER — Ambulatory Visit (HOSPITAL_COMMUNITY)
Admission: RE | Admit: 2021-02-17 | Discharge: 2021-02-17 | Disposition: A | Discharge status: Home / Self Care | Payer: No Typology Code available for payment source | Attending: Cardiovascular Disease | Admitting: Cardiovascular Disease

## 2021-02-17 ENCOUNTER — Telehealth: Payer: Self-pay

## 2021-02-17 ENCOUNTER — Ambulatory Visit (HOSPITAL_BASED_OUTPATIENT_CLINIC_OR_DEPARTMENT_OTHER)
Admission: RE | Admit: 2021-02-17 | Discharge: 2021-02-17 | Disposition: A | Discharge status: Home / Self Care | Payer: No Typology Code available for payment source | Source: Ambulatory Visit | Attending: Cardiology | Admitting: Cardiology

## 2021-02-17 ENCOUNTER — Ambulatory Visit (HOSPITAL_COMMUNITY): Payer: No Typology Code available for payment source | Admitting: Certified Registered Nurse Anesthetist

## 2021-02-17 ENCOUNTER — Encounter (HOSPITAL_COMMUNITY)
Admission: RE | Disposition: A | Discharge status: Home / Self Care | Payer: Self-pay | Source: Home / Self Care | Attending: Cardiovascular Disease

## 2021-02-17 ENCOUNTER — Other Ambulatory Visit: Payer: Self-pay

## 2021-02-17 ENCOUNTER — Encounter (HOSPITAL_COMMUNITY): Payer: Self-pay | Admitting: Cardiovascular Disease

## 2021-02-17 DIAGNOSIS — Z950 Presence of cardiac pacemaker: Secondary | ICD-10-CM | POA: Insufficient documentation

## 2021-02-17 DIAGNOSIS — Z9884 Bariatric surgery status: Secondary | ICD-10-CM | POA: Diagnosis not present

## 2021-02-17 DIAGNOSIS — I083 Combined rheumatic disorders of mitral, aortic and tricuspid valves: Secondary | ICD-10-CM | POA: Diagnosis not present

## 2021-02-17 DIAGNOSIS — Z7982 Long term (current) use of aspirin: Secondary | ICD-10-CM | POA: Diagnosis not present

## 2021-02-17 DIAGNOSIS — K219 Gastro-esophageal reflux disease without esophagitis: Secondary | ICD-10-CM | POA: Diagnosis not present

## 2021-02-17 DIAGNOSIS — Z7901 Long term (current) use of anticoagulants: Secondary | ICD-10-CM | POA: Insufficient documentation

## 2021-02-17 DIAGNOSIS — I700 Atherosclerosis of aorta: Secondary | ICD-10-CM | POA: Diagnosis not present

## 2021-02-17 DIAGNOSIS — I4821 Permanent atrial fibrillation: Secondary | ICD-10-CM | POA: Diagnosis present

## 2021-02-17 DIAGNOSIS — I10 Essential (primary) hypertension: Secondary | ICD-10-CM | POA: Insufficient documentation

## 2021-02-17 DIAGNOSIS — Z95818 Presence of other cardiac implants and grafts: Secondary | ICD-10-CM

## 2021-02-17 DIAGNOSIS — G4733 Obstructive sleep apnea (adult) (pediatric): Secondary | ICD-10-CM | POA: Diagnosis not present

## 2021-02-17 DIAGNOSIS — Q2112 Patent foramen ovale: Secondary | ICD-10-CM | POA: Insufficient documentation

## 2021-02-17 HISTORY — PX: TEE WITHOUT CARDIOVERSION: SHX5443

## 2021-02-17 SURGERY — ECHOCARDIOGRAM, TRANSESOPHAGEAL
Anesthesia: General

## 2021-02-17 MED ORDER — AZITHROMYCIN 500 MG PO TABS: ORAL_TABLET | ORAL | 4 refills | Status: DC

## 2021-02-17 MED ORDER — CLOPIDOGREL BISULFATE 75 MG PO TABS: 75.0000 mg | ORAL_TABLET | Freq: Every day | ORAL | 1 refills | Status: DC

## 2021-02-17 MED ORDER — LIDOCAINE 2% (20 MG/ML) 5 ML SYRINGE
INTRAMUSCULAR | Status: DC | PRN
  Administered 2021-02-17: 40 mg via INTRAVENOUS

## 2021-02-17 MED ORDER — PROPOFOL 500 MG/50ML IV EMUL
INTRAVENOUS | Status: DC | PRN
  Administered 2021-02-17: 100 ug/kg/min via INTRAVENOUS

## 2021-02-17 MED ORDER — SODIUM CHLORIDE 0.9 % IV SOLN: INTRAVENOUS | Status: DC | PRN

## 2021-02-17 MED ORDER — PROPOFOL 10 MG/ML IV BOLUS
INTRAVENOUS | Status: DC | PRN
  Administered 2021-02-17: 30 mg via INTRAVENOUS
  Administered 2021-02-17: 20 mg via INTRAVENOUS

## 2021-02-17 MED ORDER — SODIUM CHLORIDE 0.9 % IV SOLN: INTRAVENOUS | Status: DC

## 2021-02-17 NOTE — Anesthesia Procedure Notes (Signed)
Procedure Name: MAC Date/Time: 02/17/2021 8:50 AM Performed by: Reece Agar, CRNA Pre-anesthesia Checklist: Patient identified, Emergency Drugs available, Suction available, Patient being monitored and Timeout performed Patient Re-evaluated:Patient Re-evaluated prior to induction Oxygen Delivery Method: Nasal cannula

## 2021-02-17 NOTE — Anesthesia Postprocedure Evaluation (Signed)
Anesthesia Post Note  Patient: Jack Neal  Procedure(s) Performed: TRANSESOPHAGEAL ECHOCARDIOGRAM (TEE)     Patient location during evaluation: Endoscopy Anesthesia Type: General Level of consciousness: awake Pain management: pain level controlled Vital Signs Assessment: post-procedure vital signs reviewed and stable Respiratory status: spontaneous breathing Cardiovascular status: stable Postop Assessment: no apparent nausea or vomiting Anesthetic complications: no   No notable events documented.  Last Vitals:  Vitals:   02/17/21 0952 02/17/21 1003  BP: 127/76 128/74  Pulse: 80 80  Resp: 13 13  Temp:    SpO2: 99% 99%    Last Pain:  Vitals:   02/17/21 1003  TempSrc:   PainSc: 0-No pain                 Giordana Weinheimer

## 2021-02-17 NOTE — Discharge Instructions (Signed)

## 2021-02-17 NOTE — CV Procedure (Signed)
    TRANSESOPHAGEAL ECHOCARDIOGRAM   NAME:  Jack Neal    MRN: [id number] DOB:  Jun 11, 1945    ADMIT DATE: 02/17/2021  INDICATIONS: Post Watchman FLX  PROCEDURE:   Informed consent was obtained prior to the procedure. The risks, benefits and alternatives for the procedure were discussed and the patient comprehended these risks.  Risks include, but are not limited to, cough, sore throat, vomiting, nausea, somnolence, esophageal and stomach trauma or perforation, bleeding, low blood pressure, aspiration, pneumonia, infection, trauma to the teeth and death.    Procedural time out performed. The oropharynx was anesthetized with topical 1% benzocaine.    Anesthesia was administered by Dr. Neva Seat.  The patient was administered 236 mg of propofol and 40 mg of lidocaine to achieve and maintain moderate conscious sedation.  The patient's heart rate, blood pressure, and oxygen saturation are monitored continuously during the procedure. The period of conscious sedation is 20 minutes, of which I was present face-to-face 100% of this time.   The transesophageal probe was inserted in the esophagus and stomach without difficulty and multiple views were obtained.   COMPLICATIONS:    There were no immediate complications.  KEY FINDINGS:  2-3 mm leak of the 20 mm Watchman FLX device.  Normal LV/RV function.  Moderate MR Mod to severe TR.  Full report to follow. Further management per primary team.   Gerri Spore T. Flora Lipps, MD, [facility]  [facility]  [address] [place], [place] [address] [phone]  9:16 AM

## 2021-02-17 NOTE — Transfer of Care (Signed)
Immediate Anesthesia Transfer of Care Note  Patient: Jack Neal  Procedure(s) Performed: TRANSESOPHAGEAL ECHOCARDIOGRAM (TEE)  Patient Location: Endoscopy Unit  Anesthesia Type:MAC  Level of Consciousness: drowsy  Airway & Oxygen Therapy: Patient Spontanous Breathing and Patient connected to nasal cannula oxygen  Post-op Assessment: Report given to RN and Post -op Vital signs reviewed and stable  Post vital signs: Reviewed and stable  Last Vitals:  Vitals Value Taken Time  BP 111/65 02/17/21 0922  Temp    Pulse 80 02/17/21 0923  Resp 14 02/17/21 0923  SpO2 100 % 02/17/21 0923  Vitals shown include unvalidated device data.  Last Pain:  Vitals:   02/17/21 0822  TempSrc: Temporal  PainSc: 0-No pain         Complications: No notable events documented.

## 2021-02-17 NOTE — Progress Notes (Signed)
Echocardiogram Echocardiogram Transesophageal has been performed.  Jack Neal RDCS 02/17/2021, 9:30 AM

## 2021-02-17 NOTE — Telephone Encounter (Signed)
Per Dr. Flora Lipps, called in SBE refills for patient.

## 2021-02-17 NOTE — Anesthesia Preprocedure Evaluation (Addendum)
Anesthesia Evaluation  Patient identified by MRN, date of birth, ID band Patient awake    Reviewed: Allergy & Precautions, NPO status , Patient's Chart, lab work & pertinent test results  Airway Mallampati: II       Dental   Pulmonary sleep apnea , former smoker,    breath sounds clear to auscultation       Cardiovascular + pacemaker  Rhythm:Regular Rate:Normal     Neuro/Psych  Neuromuscular disease    GI/Hepatic Neg liver ROS, GERD  ,  Endo/Other  negative endocrine ROS  Renal/GU negative Renal ROS     Musculoskeletal   Abdominal   Peds  Hematology   Anesthesia Other Findings   Reproductive/Obstetrics                             Anesthesia Physical Anesthesia Plan  ASA: 3  Anesthesia Plan: General   Post-op Pain Management:    Induction: Intravenous  PONV Risk Score and Plan: Propofol infusion  Airway Management Planned: Mask, Simple Face Mask and Nasal Cannula  Additional Equipment:   Intra-op Plan:   Post-operative Plan:   Informed Consent: I have reviewed the patients History and Physical, chart, labs and discussed the procedure including the risks, benefits and alternatives for the proposed anesthesia with the patient or authorized representative who has indicated his/her understanding and acceptance.     Dental advisory given  Plan Discussed with: CRNA and Anesthesiologist  Anesthesia Plan Comments:         Anesthesia Quick Evaluation

## 2021-02-17 NOTE — Telephone Encounter (Signed)
Reviewed results with patient who verbalized understanding.   Instructed patient to STOP ELIQUIS and START PLAVIX 75 mg daily. He understands to take Plavix and ASA at least until 6 month mark when instructed to change. He was grateful for call and agrees with plan.

## 2021-02-17 NOTE — Interval H&P Note (Signed)
History and Physical Interval Note:  02/17/2021 8:21 AM  Jack Neal  has presented today for surgery, with the diagnosis of POST WATCHMAN IMPLANT.  The various methods of treatment have been discussed with the patient and family. After consideration of risks, benefits and other options for treatment, the patient has consented to  Procedure(s): TRANSESOPHAGEAL ECHOCARDIOGRAM (TEE) (N/A) as a surgical intervention.  The patient's history has been reviewed, patient examined, no change in status, stable for surgery.  I have reviewed the patient's chart and labs.  Questions were answered to the patient's satisfaction.    NPO for TEE for Watchman post procedure evaluation.   Shared Decision Making/Informed Consent The risks [esophageal damage, perforation (1:10,000 risk), bleeding, pharyngeal hematoma as well as other potential complications associated with conscious sedation including aspiration, arrhythmia, respiratory failure and death], benefits (treatment guidance and diagnostic support) and alternatives of a transesophageal echocardiogram were discussed in detail with Jack Neal and he is willing to proceed.   Gerri Spore T. Flora Lipps, MD, [facility]  [facility]  [address], [address] [place], [place] [address] [phone]  8:21 AM

## 2021-02-19 ENCOUNTER — Encounter (HOSPITAL_COMMUNITY): Payer: Self-pay | Admitting: Cardiovascular Disease

## 2021-05-14 ENCOUNTER — Emergency Department (HOSPITAL_COMMUNITY): Payer: No Typology Code available for payment source

## 2021-05-14 ENCOUNTER — Inpatient Hospital Stay (HOSPITAL_COMMUNITY)
Admission: EM | Admit: 2021-05-14 | Discharge: 2021-05-20 | DRG: 481 | Disposition: A | Discharge status: Rehabilitation Facility | Payer: No Typology Code available for payment source | Attending: Internal Medicine | Admitting: Internal Medicine

## 2021-05-14 DIAGNOSIS — D696 Thrombocytopenia, unspecified: Secondary | ICD-10-CM | POA: Diagnosis present

## 2021-05-14 DIAGNOSIS — R300 Dysuria: Secondary | ICD-10-CM | POA: Clinically undetermined

## 2021-05-14 DIAGNOSIS — Z87891 Personal history of nicotine dependence: Secondary | ICD-10-CM

## 2021-05-14 DIAGNOSIS — Z9884 Bariatric surgery status: Secondary | ICD-10-CM

## 2021-05-14 DIAGNOSIS — I4821 Permanent atrial fibrillation: Secondary | ICD-10-CM | POA: Diagnosis present

## 2021-05-14 DIAGNOSIS — Y92019 Unspecified place in single-family (private) house as the place of occurrence of the external cause: Secondary | ICD-10-CM

## 2021-05-14 DIAGNOSIS — G629 Polyneuropathy, unspecified: Secondary | ICD-10-CM | POA: Diagnosis present

## 2021-05-14 DIAGNOSIS — E440 Moderate protein-calorie malnutrition: Secondary | ICD-10-CM | POA: Diagnosis present

## 2021-05-14 DIAGNOSIS — Z808 Family history of malignant neoplasm of other organs or systems: Secondary | ICD-10-CM

## 2021-05-14 DIAGNOSIS — Z880 Allergy status to penicillin: Secondary | ICD-10-CM

## 2021-05-14 DIAGNOSIS — W010XXA Fall on same level from slipping, tripping and stumbling without subsequent striking against object, initial encounter: Secondary | ICD-10-CM | POA: Diagnosis present

## 2021-05-14 DIAGNOSIS — K8689 Other specified diseases of pancreas: Secondary | ICD-10-CM | POA: Diagnosis present

## 2021-05-14 DIAGNOSIS — Z6823 Body mass index (BMI) 23.0-23.9, adult: Secondary | ICD-10-CM

## 2021-05-14 DIAGNOSIS — Z95818 Presence of other cardiac implants and grafts: Secondary | ICD-10-CM

## 2021-05-14 DIAGNOSIS — S72001A Fracture of unspecified part of neck of right femur, initial encounter for closed fracture: Principal | ICD-10-CM

## 2021-05-14 DIAGNOSIS — Z8249 Family history of ischemic heart disease and other diseases of the circulatory system: Secondary | ICD-10-CM

## 2021-05-14 DIAGNOSIS — D62 Acute posthemorrhagic anemia: Secondary | ICD-10-CM | POA: Diagnosis not present

## 2021-05-14 DIAGNOSIS — K5900 Constipation, unspecified: Secondary | ICD-10-CM | POA: Diagnosis not present

## 2021-05-14 DIAGNOSIS — Z20822 Contact with and (suspected) exposure to covid-19: Secondary | ICD-10-CM | POA: Diagnosis present

## 2021-05-14 DIAGNOSIS — Z419 Encounter for procedure for purposes other than remedying health state, unspecified: Secondary | ICD-10-CM

## 2021-05-14 DIAGNOSIS — F1120 Opioid dependence, uncomplicated: Secondary | ICD-10-CM | POA: Diagnosis present

## 2021-05-14 DIAGNOSIS — I959 Hypotension, unspecified: Secondary | ICD-10-CM | POA: Diagnosis present

## 2021-05-14 DIAGNOSIS — E871 Hypo-osmolality and hyponatremia: Secondary | ICD-10-CM | POA: Diagnosis not present

## 2021-05-14 DIAGNOSIS — G4733 Obstructive sleep apnea (adult) (pediatric): Secondary | ICD-10-CM | POA: Diagnosis present

## 2021-05-14 DIAGNOSIS — Z7982 Long term (current) use of aspirin: Secondary | ICD-10-CM

## 2021-05-14 DIAGNOSIS — Z950 Presence of cardiac pacemaker: Secondary | ICD-10-CM

## 2021-05-14 DIAGNOSIS — E0781 Sick-euthyroid syndrome: Secondary | ICD-10-CM | POA: Diagnosis present

## 2021-05-14 DIAGNOSIS — M84451A Pathological fracture, right femur, initial encounter for fracture: Secondary | ICD-10-CM | POA: Diagnosis not present

## 2021-05-14 DIAGNOSIS — D539 Nutritional anemia, unspecified: Secondary | ICD-10-CM | POA: Diagnosis present

## 2021-05-14 DIAGNOSIS — Z9989 Dependence on other enabling machines and devices: Secondary | ICD-10-CM

## 2021-05-14 DIAGNOSIS — Z09 Encounter for follow-up examination after completed treatment for conditions other than malignant neoplasm: Secondary | ICD-10-CM

## 2021-05-14 DIAGNOSIS — T148XXA Other injury of unspecified body region, initial encounter: Secondary | ICD-10-CM | POA: Diagnosis not present

## 2021-05-14 DIAGNOSIS — I10 Essential (primary) hypertension: Secondary | ICD-10-CM | POA: Diagnosis present

## 2021-05-14 DIAGNOSIS — F4310 Post-traumatic stress disorder, unspecified: Secondary | ICD-10-CM | POA: Diagnosis present

## 2021-05-14 DIAGNOSIS — G894 Chronic pain syndrome: Secondary | ICD-10-CM | POA: Diagnosis present

## 2021-05-14 DIAGNOSIS — Z7902 Long term (current) use of antithrombotics/antiplatelets: Secondary | ICD-10-CM

## 2021-05-14 DIAGNOSIS — Z79899 Other long term (current) drug therapy: Secondary | ICD-10-CM

## 2021-05-14 DIAGNOSIS — Z888 Allergy status to other drugs, medicaments and biological substances status: Secondary | ICD-10-CM

## 2021-05-14 DIAGNOSIS — R946 Abnormal results of thyroid function studies: Secondary | ICD-10-CM | POA: Clinically undetermined

## 2021-05-14 DIAGNOSIS — Z66 Do not resuscitate: Secondary | ICD-10-CM | POA: Diagnosis present

## 2021-05-14 DIAGNOSIS — K219 Gastro-esophageal reflux disease without esophagitis: Secondary | ICD-10-CM | POA: Diagnosis present

## 2021-05-14 DIAGNOSIS — S42123A Displaced fracture of acromial process, unspecified shoulder, initial encounter for closed fracture: Secondary | ICD-10-CM

## 2021-05-14 DIAGNOSIS — Z91018 Allergy to other foods: Secondary | ICD-10-CM

## 2021-05-14 DIAGNOSIS — F0390 Unspecified dementia without behavioral disturbance: Secondary | ICD-10-CM

## 2021-05-14 LAB — COMPREHENSIVE METABOLIC PANEL
ALT: 12 U/L (ref 0–44)
AST: 20 U/L (ref 15–41)
Albumin: 3.4 g/dL — ABNORMAL LOW (ref 3.5–5.0)
Alkaline Phosphatase: 143 U/L — ABNORMAL HIGH (ref 38–126)
Anion gap: 7 (ref 5–15)
BUN: 13 mg/dL (ref 8–23)
CO2: 29 mmol/L (ref 22–32)
Calcium: 8.5 mg/dL — ABNORMAL LOW (ref 8.9–10.3)
Chloride: 103 mmol/L (ref 98–111)
Creatinine, Ser: 0.82 mg/dL (ref 0.61–1.24)
GFR, Estimated: >60 mL/min (ref >60)
Glucose, Bld: 98 mg/dL (ref 70–99)
Potassium: 4.2 mmol/L (ref 3.5–5.1)
Sodium: 139 mmol/L (ref 135–145)
Total Bilirubin: 1.2 mg/dL (ref 0.3–1.2)
Total Protein: 6 g/dL — ABNORMAL LOW (ref 6.5–8.1)

## 2021-05-14 LAB — SAMPLE TO BLOOD BANK

## 2021-05-14 LAB — CBC
HCT: 37 % — ABNORMAL LOW (ref 39.0–52.0)
Hemoglobin: 11.8 g/dL — ABNORMAL LOW (ref 13.0–17.0)
MCH: 32.9 pg (ref 26.0–34.0)
MCHC: 31.9 g/dL (ref 30.0–36.0)
MCV: 103.1 fL — ABNORMAL HIGH (ref 80.0–100.0)
Platelets: 124 10*3/uL — ABNORMAL LOW (ref 150–400)
RBC: 3.59 MIL/uL — ABNORMAL LOW (ref 4.22–5.81)
RDW: 13.9 % (ref 11.5–15.5)
WBC: 5.2 10*3/uL (ref 4.0–10.5)
nRBC: 0 % (ref 0.0–0.2)

## 2021-05-14 LAB — PROTIME-INR
INR: 1.2 (ref 0.8–1.2)
Prothrombin Time: 15.2 seconds (ref 11.4–15.2)

## 2021-05-14 LAB — ETHANOL: Alcohol, Ethyl (B): <10 mg/dL (ref <10)

## 2021-05-14 MED ORDER — SODIUM CHLORIDE 0.9 % IV BOLUS
500.0000 mL | Freq: Once | INTRAVENOUS | Status: AC
  Administered 2021-05-15: 500 mL via INTRAVENOUS

## 2021-05-14 MED ORDER — METHOCARBAMOL 1000 MG/10ML IJ SOLN
500.0000 mg | Freq: Once | INTRAVENOUS | Status: AC
  Administered 2021-05-15: 500 mg via INTRAVENOUS
  Filled 2021-05-14: qty 5

## 2021-05-14 MED ORDER — FENTANYL CITRATE PF 50 MCG/ML IJ SOSY
50.0000 ug | PREFILLED_SYRINGE | Freq: Once | INTRAMUSCULAR | Status: AC
  Administered 2021-05-14: 50 ug via INTRAVENOUS
  Filled 2021-05-14: qty 1

## 2021-05-14 MED ORDER — SODIUM CHLORIDE 0.9 % IV SOLN
INTRAVENOUS | Status: DC
Start: 2021-05-14 — End: 2021-05-18

## 2021-05-14 MED ORDER — METHOCARBAMOL 1000 MG/10ML IJ SOLN
500.0000 mg | Freq: Once | INTRAMUSCULAR | Status: DC
Start: 2021-05-14 — End: 2021-05-14

## 2021-05-14 MED ORDER — IOHEXOL 300 MG/ML  SOLN
100.0000 mL | Freq: Once | INTRAMUSCULAR | Status: AC | PRN
  Administered 2021-05-14: 100 mL via INTRAVENOUS

## 2021-05-14 NOTE — Progress Notes (Signed)
?   05/14/21 2240  ?Clinical Encounter Type  ?Visited With Patient and family together  ?Visit Type Initial;Spiritual support;Social support;Trauma  ?Referral From Nurse  ?Consult/Referral To None  ? ?Chaplain responded to a level two trauma, medical team was assessing patient's injuries. Family member was present and I was able with speak with her as her husband was cared for.  We had a small window before radiology began working and the patient's wife asked for prayer.  ?Patient headed to CT and wife asked for some quiet time. I advised her if she wanted a chaplain to return to let a nurse know.  ? ?Danice Goltz ?Chaplain Resident  ?[facility]  ?[phone] ?

## 2021-05-14 NOTE — ED Triage Notes (Signed)
BIB [facility]. Aox4. Fall from standing backward. Hit head. On plavix. Right hip and shoulder pain.  ?

## 2021-05-14 NOTE — ED Notes (Addendum)
Trauma Response Nurse Documentation ? ? ?Jack Neal is a 76 y.o. male arriving to [facility] ED via EMS ? ?On clopidogrel 75 mg daily. Trauma was activated as a Level 2 by EDP after arrival based on the following trauma criteria Elderly patients > 65 with head trauma on anti-coagulation (excluding ASA). Trauma team at the bedside on patient arrival. Patient cleared for CT by [facility]. Patient to CT with team. GCS 15. ? ?History  ? Past Medical History:  ?Diagnosis Date  ? AF (paroxysmal atrial fibrillation) ([place]) 10/01/2020  ? Atrial fibrillation ([place])   ? Celiac disease 10/01/2020  ? Chronic pain syndrome 10/01/2020  ? Depression   ? GERD (gastroesophageal reflux disease)   ? 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 Hypotension   ? Major depressive disorder 10/01/2020  ? Neuropathy   ? OSA on CPAP 10/01/2020  ? Pacemaker   ? Peripheral polyneuropathy 10/01/2020  ? Presence of cardiac pacemaker 10/01/2020  ? 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 Watchman FLX 20  ? PTSD (post-traumatic stress disorder) 10/01/2020  ?  ? Past Surgical History:  ?Procedure Laterality Date  ? LEFT ATRIAL APPENDAGE OCCLUSION N/A 01/02/2021  ? Procedure: LEFT ATRIAL APPENDAGE OCCLUSION;  Surgeon: Vickie Epley, MD;  Location: [place] [facility];  Service: Cardiovascular;  Laterality: N/A;  ? TEE WITHOUT CARDIOVERSION N/A 01/02/2021  ? Procedure: TRANSESOPHAGEAL ECHOCARDIOGRAM (TEE);  Surgeon: Vickie Epley, MD;  Location: [place] [facility];  Service: Cardiovascular;  Laterality: N/A;  ? TEE WITHOUT CARDIOVERSION N/A 02/17/2021  ? Procedure: TRANSESOPHAGEAL ECHOCARDIOGRAM (TEE);  Surgeon: Geralynn Rile, MD;  Location: [place];  Service: Cardiovascular;  Laterality: N/A;  ?  ? ? ? ?Initial Focused Assessment (If applicable, or please see trauma documentation): ?Alert x4 male arrives via EMS, laceration posterior head, pain right hip and right shoulder ? ?CT's Completed:   ?CT Head  and CT C-Spine  ? ?Interventions:  ?[place] c-collar ?IV start and trauma lab draw ?Portable chest, pelvis and shoulder XRAY ?CT head and cervical spine ? ?Plan for disposition:  ?Anticipate Admission to floor  ?Pending laceration repair ? ?Consults completed:  ?Anticipate Orthopaedic Surgeon consult ? ?Event Summary: ?Patient arrives via EMS from home after a ground level fall backwards, striking the back of his head. Right shoulder and right hip pain. Takes plavix, no LOC. Level two trauma activated after arrival d/t inaccurate EMS report. TRN to bedside, obtained manual BP 86/46 after two attempts - notified Dr. Darl Householder and Marlon Pel PA-C to upgrade, per [facility] DO NOT upgrade. Auto BP 113/74. Portable XRAYS completed, patient in significant pain with moving/transfers. Escorted to CT by TRN. Pending imaging results for ortho consult, admission. ? ?MTP Summary (If applicable): NA ? ?Bedside handoff with ED RN Kae Heller.   ? ?[place]  ?Trauma Response RN ? ?Please call TRN at [phone] for further assistance. ?  ?

## 2021-05-14 NOTE — Progress Notes (Signed)
Orthopedic Tech Progress Note ?Patient Details:  ?Aiven Kampe ?23-Jul-1945 ?[id number] ? ?Level 2 trauma, not needed at this second  ? ?Patient ID: Demian Maisel, male   DOB: 12/12/1945, 76 y.o.   MRN: [id number] ? ?[name] ?05/14/2021, 10:54 PM ? ?

## 2021-05-14 NOTE — ED Provider Notes (Signed)
?[place] DEPT ?[facility] Emergency Department ?Provider Note ?MRN:  [id number]  ?Arrival date & time: 05/15/21    ? ?Chief Complaint   ?Trauma (Fall) ?  ?History of Present Illness   ?Jack Neal is a 76 y.o. year-old male presents to the ED with chief complaint of fall from standing.  He takes plavix.  He fell backward from standing while trying to put on his night slippers.  He hit his right hip, right shoulder, and the back of his head.  He denies LOC.  He reports severe pain with movement.  He states that he has not been able to walk since the fall. ? ?History provided by patient. ? ?Review of Systems  ?Pertinent review of systems noted in HPI.  ? ? ?Physical Exam  ? ?Vitals:  ? 05/15/21 0007 05/15/21 0015  ?BP: 124/79 103/73  ?Pulse: 94 73  ?Resp: (!) 21 18  ?Temp:    ?SpO2: 98% 99%  ?  ?CONSTITUTIONAL:  uncomfortable-appearing, NAD ?NEURO:  Alert and oriented x 3, CN 3-12 grossly intact ?EYES:  eyes equal and reactive ?ENT/NECK:  Supple, no stridor  ?CARDIO:  normal rate, regular rhythm, appears well-perfused, intact distal pulses ?PULM:  No respiratory distress, CTAB ?GI/GU:  non-distended, non-tender ?MSK/SPINE:  Right shoulder and right hip TTP, ROM deferred due to pain ?SKIN:  no rash, atraumatic ? ? ?*Additional and/or pertinent findings included in MDM below ? ?Diagnostic and Interventional Summary  ? ? EKG Interpretation ? ?Date/Time:    ?Ventricular Rate:    ?PR Interval:    ?QRS Duration:   ?QT Interval:    ?QTC Calculation:   ?R Axis:     ?Text Interpretation:   ?  ? ?  ? ?Labs Reviewed  ?COMPREHENSIVE METABOLIC PANEL - Abnormal; Notable for the following components:  ?    Result Value  ? Calcium 8.5 (*)   ? Total Protein 6.0 (*)   ? Albumin 3.4 (*)   ? Alkaline Phosphatase 143 (*)   ? All other components within normal limits  ?CBC - Abnormal; Notable for the following components:  ? RBC 3.59 (*)   ? Hemoglobin 11.8 (*)   ? HCT 37.0 (*)   ? MCV 103.1 (*)   ? Platelets 124 (*)   ? All  other components within normal limits  ?RESP PANEL BY RT-PCR (FLU A&B, COVID) ARPGX2  ?ETHANOL  ?PROTIME-INR  ?SAMPLE TO BLOOD BANK  ?TYPE AND SCREEN  ?  ?CT CHEST ABDOMEN PELVIS W CONTRAST  ?Final Result  ?  ?CT HEAD WO CONTRAST (5MM)  ?Final Result  ?  ?CT Cervical Spine Wo Contrast  ?Final Result  ?  ?DG Chest Portable 1 View  ?Final Result  ?Addendum (preliminary) 1 of 1  ?ADDENDUM REPORT: 05/14/2021 23:27  ?  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  ?  ?  ?Electronically Signed  ?  By: Iven Finn M.D.  ?  On: 05/14/2021 23:27  ?  ?  ?Final  ?  ?DG Pelvis Portable  ?Final Result  ?  ?DG Shoulder Right  ?Final Result  ?  ?DG FEMUR, MIN 2 VIEWS RIGHT    (Results Pending)  ?  ?Medications  ?0.9 %  sodium chloride infusion ( Intravenous New Bag/Given 05/15/21 0013)  ?fentaNYL (SUBLIMAZE) injection 50 mcg (50 mcg Intravenous Given 05/14/21 2255)  ?fentaNYL (SUBLIMAZE) injection 50 mcg (50 mcg Intravenous Given 05/14/21 2332)  ?fentaNYL (SUBLIMAZE) injection 50 mcg (50  mcg Intravenous Given 05/14/21 2337)  ?sodium chloride 0.9 % bolus 500 mL (0 mLs Intravenous Stopped 05/15/21 0014)  ?methocarbamol (ROBAXIN) 500 mg in dextrose 5 % 50 mL IVPB (500 mg Intravenous New Bag/Given 05/15/21 0012)  ?iohexol (OMNIPAQUE) 300 MG/ML solution 100 mL (100 mLs Intravenous Contrast Given 05/14/21 2356)  ?fentaNYL (SUBLIMAZE) injection 50 mcg (50 mcg Intravenous Given 05/15/21 0017)  ?  ? ?Procedures  /  Critical Care ?Procedures ? ?ED Course and Medical Decision Making  ?I have reviewed the triage vital signs, the nursing notes, and pertinent available records from the EMR. ? ?Complexity of Problems Addressed: ?High Complexity: Acute illness/injury posing a threat to life or bodily function, requiring emergent diagnostic workup, evaluation, and treatment as below. ? ?ED Course: ?After considering the following differential, polytrauma, I ordered CT head and cervical spine, CXR, pelvis  XR, and right shoulder. ?I personally interpreted the labs which are notable for HGB of 11.8, slightly lower than baseline for patient.  ?I visualized the pelvis x-ray which is notable for right intertrochanteric fx and agree with the radiologist interpretation.. ? ?Clinical Course as of 05/14/21 2356  ?Wed May 14, 2021  ?2240 ? ? ? ? ? ?2256 Patient upgraded to level 2 trauma by me due to fall on plavix per department policy.  GCS 15.  Complaining of right hip pain, right shoulder pain, and head pain. ? ? ?RN informs me that manual side-lying BP was 86/46 and asked about upgrading to level 1.  Patient was repositioned and BP was checked again and was 113/74 with normal HR, GCS of 15 and normal O2 sat. ?Will keep patient as a level 2 unless BP drops. [RB]  ?2338 DG Pelvis Portable [AP]  ? ?2333 BP is now 99/64.  Dr. Randal Buba to see patient.  Patient upgraded to level 1 due to worsening BP and CT chest/abd/pel ordered. ? ?0010 Patient has been seen and cleared from trauma by Dr. Bobbye Morton, who recommends medicine admit and ortho consult. ? ?  ?Clinical Course User Index ?[AP] Palumbo, April, MD ?[RB] Montine Circle, PA-C  ? ? ?Social Determinants Affecting Care: ? ? ? ? ?Consultants: ?After patient was upgraded to a level 1 trauma, I discussed case briefly with Dr. Jamas Lav, who has seen the patient and cleared him from trauma standpoint. ? ?I consulted with with Dr. Mable Fill, orthopedics, who is agreeable with plan for medicine admission and will see the patient. ? ?I consulted with Dr. Vallarie Mare, who is appreciated for admitting. ? ?Treatment and Plan: ?Admit to medicine. ? ? ? ?Patient seen by and discussed with attending physician, Dr. Randal Buba, who agrees with plan and was instrumental in workup and evaluation of this patient. ? ?Final Clinical Impressions(s) / ED Diagnoses  ? ?  ICD-10-CM   ?1. Closed fracture of right hip, initial encounter ([place])  S72.001A   ?  ?2. Fracture  T14.8XXA DG FEMUR, MIN 2 VIEWS RIGHT  ?  DG  FEMUR, MIN 2 VIEWS RIGHT  ?  ?  ?ED Discharge Orders   ? ? None  ? ?  ?  ? ? ?Discharge Instructions Discussed with and Provided to Patient:  ? ?Discharge Instructions   ?None ?  ? ?  ?Montine Circle, PA-C ?05/15/21 [id number] ? ?  ?Palumbo, April, MD ?05/15/21 [id number] ? ?

## 2021-05-15 ENCOUNTER — Inpatient Hospital Stay (HOSPITAL_COMMUNITY): Payer: No Typology Code available for payment source

## 2021-05-15 ENCOUNTER — Inpatient Hospital Stay (HOSPITAL_COMMUNITY): Payer: No Typology Code available for payment source | Admitting: Anesthesiology

## 2021-05-15 ENCOUNTER — Other Ambulatory Visit: Payer: Self-pay

## 2021-05-15 ENCOUNTER — Encounter (HOSPITAL_COMMUNITY)
Admission: EM | Disposition: A | Discharge status: Rehabilitation Facility | Payer: Self-pay | Source: Home / Self Care | Attending: Internal Medicine

## 2021-05-15 ENCOUNTER — Encounter (HOSPITAL_COMMUNITY): Payer: Self-pay | Admitting: Family Medicine

## 2021-05-15 DIAGNOSIS — E440 Moderate protein-calorie malnutrition: Secondary | ICD-10-CM | POA: Diagnosis present

## 2021-05-15 DIAGNOSIS — F4001 Agoraphobia with panic disorder: Secondary | ICD-10-CM | POA: Diagnosis not present

## 2021-05-15 DIAGNOSIS — F4310 Post-traumatic stress disorder, unspecified: Secondary | ICD-10-CM | POA: Diagnosis present

## 2021-05-15 DIAGNOSIS — F4312 Post-traumatic stress disorder, chronic: Secondary | ICD-10-CM | POA: Diagnosis not present

## 2021-05-15 DIAGNOSIS — Z7902 Long term (current) use of antithrombotics/antiplatelets: Secondary | ICD-10-CM | POA: Diagnosis not present

## 2021-05-15 DIAGNOSIS — K8689 Other specified diseases of pancreas: Secondary | ICD-10-CM | POA: Diagnosis present

## 2021-05-15 DIAGNOSIS — I959 Hypotension, unspecified: Secondary | ICD-10-CM | POA: Diagnosis present

## 2021-05-15 DIAGNOSIS — G3184 Mild cognitive impairment, so stated: Secondary | ICD-10-CM | POA: Diagnosis not present

## 2021-05-15 DIAGNOSIS — S72001A Fracture of unspecified part of neck of right femur, initial encounter for closed fracture: Secondary | ICD-10-CM

## 2021-05-15 DIAGNOSIS — I4891 Unspecified atrial fibrillation: Secondary | ICD-10-CM

## 2021-05-15 DIAGNOSIS — S72141D Displaced intertrochanteric fracture of right femur, subsequent encounter for closed fracture with routine healing: Secondary | ICD-10-CM | POA: Diagnosis not present

## 2021-05-15 DIAGNOSIS — F1120 Opioid dependence, uncomplicated: Secondary | ICD-10-CM | POA: Diagnosis present

## 2021-05-15 DIAGNOSIS — M84451A Pathological fracture, right femur, initial encounter for fracture: Secondary | ICD-10-CM | POA: Diagnosis present

## 2021-05-15 DIAGNOSIS — K5900 Constipation, unspecified: Secondary | ICD-10-CM | POA: Diagnosis not present

## 2021-05-15 DIAGNOSIS — K219 Gastro-esophageal reflux disease without esophagitis: Secondary | ICD-10-CM | POA: Diagnosis present

## 2021-05-15 DIAGNOSIS — Z9989 Dependence on other enabling machines and devices: Secondary | ICD-10-CM | POA: Diagnosis not present

## 2021-05-15 DIAGNOSIS — Y92019 Unspecified place in single-family (private) house as the place of occurrence of the external cause: Secondary | ICD-10-CM | POA: Diagnosis not present

## 2021-05-15 DIAGNOSIS — I10 Essential (primary) hypertension: Secondary | ICD-10-CM | POA: Diagnosis present

## 2021-05-15 DIAGNOSIS — Z95818 Presence of other cardiac implants and grafts: Secondary | ICD-10-CM | POA: Diagnosis not present

## 2021-05-15 DIAGNOSIS — M7989 Other specified soft tissue disorders: Secondary | ICD-10-CM | POA: Diagnosis not present

## 2021-05-15 DIAGNOSIS — K5901 Slow transit constipation: Secondary | ICD-10-CM | POA: Diagnosis not present

## 2021-05-15 DIAGNOSIS — R946 Abnormal results of thyroid function studies: Secondary | ICD-10-CM | POA: Diagnosis not present

## 2021-05-15 DIAGNOSIS — Z808 Family history of malignant neoplasm of other organs or systems: Secondary | ICD-10-CM | POA: Diagnosis not present

## 2021-05-15 DIAGNOSIS — Z9884 Bariatric surgery status: Secondary | ICD-10-CM | POA: Diagnosis not present

## 2021-05-15 DIAGNOSIS — I4821 Permanent atrial fibrillation: Secondary | ICD-10-CM

## 2021-05-15 DIAGNOSIS — S72141A Displaced intertrochanteric fracture of right femur, initial encounter for closed fracture: Secondary | ICD-10-CM | POA: Diagnosis not present

## 2021-05-15 DIAGNOSIS — Z66 Do not resuscitate: Secondary | ICD-10-CM | POA: Diagnosis present

## 2021-05-15 DIAGNOSIS — G4733 Obstructive sleep apnea (adult) (pediatric): Secondary | ICD-10-CM

## 2021-05-15 DIAGNOSIS — G629 Polyneuropathy, unspecified: Secondary | ICD-10-CM | POA: Diagnosis present

## 2021-05-15 DIAGNOSIS — W010XXA Fall on same level from slipping, tripping and stumbling without subsequent striking against object, initial encounter: Secondary | ICD-10-CM | POA: Diagnosis present

## 2021-05-15 DIAGNOSIS — S42123A Displaced fracture of acromial process, unspecified shoulder, initial encounter for closed fracture: Secondary | ICD-10-CM

## 2021-05-15 DIAGNOSIS — F0390 Unspecified dementia without behavioral disturbance: Secondary | ICD-10-CM

## 2021-05-15 DIAGNOSIS — Z8249 Family history of ischemic heart disease and other diseases of the circulatory system: Secondary | ICD-10-CM | POA: Diagnosis not present

## 2021-05-15 DIAGNOSIS — Z87891 Personal history of nicotine dependence: Secondary | ICD-10-CM | POA: Diagnosis not present

## 2021-05-15 DIAGNOSIS — E871 Hypo-osmolality and hyponatremia: Secondary | ICD-10-CM | POA: Diagnosis not present

## 2021-05-15 DIAGNOSIS — D539 Nutritional anemia, unspecified: Secondary | ICD-10-CM | POA: Diagnosis not present

## 2021-05-15 DIAGNOSIS — Z950 Presence of cardiac pacemaker: Secondary | ICD-10-CM | POA: Diagnosis not present

## 2021-05-15 DIAGNOSIS — D62 Acute posthemorrhagic anemia: Secondary | ICD-10-CM | POA: Diagnosis not present

## 2021-05-15 DIAGNOSIS — R300 Dysuria: Secondary | ICD-10-CM | POA: Diagnosis not present

## 2021-05-15 DIAGNOSIS — G894 Chronic pain syndrome: Secondary | ICD-10-CM | POA: Diagnosis present

## 2021-05-15 DIAGNOSIS — D696 Thrombocytopenia, unspecified: Secondary | ICD-10-CM | POA: Diagnosis present

## 2021-05-15 DIAGNOSIS — Z20822 Contact with and (suspected) exposure to covid-19: Secondary | ICD-10-CM | POA: Diagnosis present

## 2021-05-15 DIAGNOSIS — T148XXA Other injury of unspecified body region, initial encounter: Secondary | ICD-10-CM | POA: Diagnosis present

## 2021-05-15 HISTORY — PX: INTRAMEDULLARY (IM) NAIL INTERTROCHANTERIC: SHX5875

## 2021-05-15 LAB — RESP PANEL BY RT-PCR (FLU A&B, COVID) ARPGX2
Influenza A by PCR: NEGATIVE
Influenza B by PCR: NEGATIVE
SARS Coronavirus 2 by RT PCR: NEGATIVE

## 2021-05-15 LAB — BASIC METABOLIC PANEL
Anion gap: 7 (ref 5–15)
BUN: 14 mg/dL (ref 8–23)
CO2: 27 mmol/L (ref 22–32)
Calcium: 8.2 mg/dL — ABNORMAL LOW (ref 8.9–10.3)
Chloride: 105 mmol/L (ref 98–111)
Creatinine, Ser: 0.6 mg/dL — ABNORMAL LOW (ref 0.61–1.24)
GFR, Estimated: >60 mL/min (ref >60)
Glucose, Bld: 114 mg/dL — ABNORMAL HIGH (ref 70–99)
Potassium: 3.8 mmol/L (ref 3.5–5.1)
Sodium: 139 mmol/L (ref 135–145)

## 2021-05-15 LAB — CBC
HCT: 32.3 % — ABNORMAL LOW (ref 39.0–52.0)
Hemoglobin: 10.1 g/dL — ABNORMAL LOW (ref 13.0–17.0)
MCH: 31.3 pg (ref 26.0–34.0)
MCHC: 31.3 g/dL (ref 30.0–36.0)
MCV: 100 fL (ref 80.0–100.0)
Platelets: 110 10*3/uL — ABNORMAL LOW (ref 150–400)
RBC: 3.23 MIL/uL — ABNORMAL LOW (ref 4.22–5.81)
RDW: 14.6 % (ref 11.5–15.5)
WBC: 6.2 10*3/uL (ref 4.0–10.5)
nRBC: 0 % (ref 0.0–0.2)

## 2021-05-15 LAB — BLOOD PRODUCT ORDER (VERBAL) VERIFICATION

## 2021-05-15 LAB — SURGICAL PCR SCREEN
MRSA, PCR: NEGATIVE
Staphylococcus aureus: NEGATIVE

## 2021-05-15 SURGERY — FIXATION, FRACTURE, INTERTROCHANTERIC, WITH INTRAMEDULLARY ROD
Anesthesia: General | Laterality: Right

## 2021-05-15 MED ORDER — VANCOMYCIN HCL 1000 MG IV SOLR
INTRAVENOUS | Status: DC | PRN
  Administered 2021-05-15: 1000 mg

## 2021-05-15 MED ORDER — PROBIOTIC PO CHEW: 3.0000 | CHEWABLE_TABLET | Freq: Every day | ORAL | Status: DC

## 2021-05-15 MED ORDER — ONDANSETRON HCL 4 MG PO TABS: 4.0000 mg | ORAL_TABLET | Freq: Four times a day (QID) | ORAL | Status: DC | PRN

## 2021-05-15 MED ORDER — FERROUS SULFATE 325 (65 FE) MG PO TABS
325.0000 mg | ORAL_TABLET | ORAL | Status: DC
Start: 2021-05-16 — End: 2021-05-20
  Administered 2021-05-16 – 2021-05-19 (×2): 325 mg via ORAL
  Filled 2021-05-15 (×5): qty 1

## 2021-05-15 MED ORDER — DULOXETINE HCL 30 MG PO CPEP: 30.0000 mg | ORAL_CAPSULE | ORAL | Status: DC

## 2021-05-15 MED ORDER — MORPHINE SULFATE (PF) 2 MG/ML IV SOLN
2.0000 mg | INTRAVENOUS | Status: DC | PRN
  Administered 2021-05-15 (×3): 2 mg via INTRAVENOUS
  Filled 2021-05-15 (×3): qty 1

## 2021-05-15 MED ORDER — METHOCARBAMOL 1000 MG/10ML IJ SOLN
500.0000 mg | Freq: Four times a day (QID) | INTRAVENOUS | Status: DC | PRN
  Filled 2021-05-15: qty 5

## 2021-05-15 MED ORDER — ROCURONIUM BROMIDE 10 MG/ML (PF) SYRINGE
PREFILLED_SYRINGE | INTRAVENOUS | Status: DC | PRN
  Administered 2021-05-15: 20 mg via INTRAVENOUS
  Administered 2021-05-15: 60 mg via INTRAVENOUS

## 2021-05-15 MED ORDER — EPHEDRINE SULFATE-NACL 50-0.9 MG/10ML-% IV SOSY
PREFILLED_SYRINGE | INTRAVENOUS | Status: DC | PRN
Start: 2021-05-15 — End: 2021-05-15
  Administered 2021-05-15 (×2): 10 mg via INTRAVENOUS

## 2021-05-15 MED ORDER — DULOXETINE HCL 60 MG PO CPEP
60.0000 mg | ORAL_CAPSULE | Freq: Every day | ORAL | Status: DC
  Administered 2021-05-16 – 2021-05-20 (×5): 60 mg via ORAL
  Filled 2021-05-15 (×5): qty 1

## 2021-05-15 MED ORDER — METHOCARBAMOL 500 MG PO TABS
500.0000 mg | ORAL_TABLET | Freq: Four times a day (QID) | ORAL | Status: DC | PRN
  Administered 2021-05-20: 500 mg via ORAL
  Filled 2021-05-15: qty 1

## 2021-05-15 MED ORDER — FENTANYL CITRATE (PF) 100 MCG/2ML IJ SOLN
25.0000 ug | INTRAMUSCULAR | Status: DC | PRN
  Administered 2021-05-15: 25 ug via INTRAVENOUS

## 2021-05-15 MED ORDER — SIMETHICONE 80 MG PO CHEW: 160.0000 mg | CHEWABLE_TABLET | Freq: Four times a day (QID) | ORAL | Status: DC | PRN

## 2021-05-15 MED ORDER — RISAQUAD PO CAPS
1.0000 | ORAL_CAPSULE | Freq: Every day | ORAL | Status: DC
  Administered 2021-05-16 – 2021-05-20 (×5): 1 via ORAL
  Filled 2021-05-15 (×6): qty 1

## 2021-05-15 MED ORDER — ONDANSETRON HCL 4 MG/2ML IJ SOLN
INTRAMUSCULAR | Status: DC | PRN
  Administered 2021-05-15: 4 mg via INTRAVENOUS

## 2021-05-15 MED ORDER — METOCLOPRAMIDE HCL 5 MG/ML IJ SOLN: 5.0000 mg | Freq: Three times a day (TID) | INTRAMUSCULAR | Status: DC | PRN

## 2021-05-15 MED ORDER — FENTANYL CITRATE (PF) 250 MCG/5ML IJ SOLN
INTRAMUSCULAR | Status: AC
  Filled 2021-05-15: qty 5

## 2021-05-15 MED ORDER — HYDROCODONE-ACETAMINOPHEN 5-325 MG PO TABS
1.0000 | ORAL_TABLET | Freq: Four times a day (QID) | ORAL | Status: DC | PRN
  Administered 2021-05-15: 1 via ORAL
  Filled 2021-05-15: qty 2

## 2021-05-15 MED ORDER — MENTHOL 3 MG MT LOZG: 1.0000 | LOZENGE | OROMUCOSAL | Status: DC | PRN

## 2021-05-15 MED ORDER — CEFAZOLIN SODIUM-DEXTROSE 2-4 GM/100ML-% IV SOLN
INTRAVENOUS | Status: AC
  Filled 2021-05-15: qty 100

## 2021-05-15 MED ORDER — VANCOMYCIN HCL 1000 MG IV SOLR
INTRAVENOUS | Status: AC
  Filled 2021-05-15: qty 20

## 2021-05-15 MED ORDER — FLUTICASONE PROPIONATE 50 MCG/ACT NA SUSP: 2.0000 | Freq: Every day | NASAL | Status: DC | PRN

## 2021-05-15 MED ORDER — PHENYLEPHRINE 40 MCG/ML (10ML) SYRINGE FOR IV PUSH (FOR BLOOD PRESSURE SUPPORT)
PREFILLED_SYRINGE | INTRAVENOUS | Status: AC
  Filled 2021-05-15: qty 10

## 2021-05-15 MED ORDER — EPHEDRINE 5 MG/ML INJ
INTRAVENOUS | Status: AC
  Filled 2021-05-15: qty 5

## 2021-05-15 MED ORDER — CALCIUM CARBONATE ANTACID 500 MG PO CHEW
1000.0000 mg | CHEWABLE_TABLET | Freq: Every day | ORAL | Status: DC | PRN
  Administered 2021-05-16 – 2021-05-20 (×3): 1000 mg via ORAL
  Filled 2021-05-15 (×3): qty 5

## 2021-05-15 MED ORDER — MEMANTINE HCL 10 MG PO TABS
10.0000 mg | ORAL_TABLET | Freq: Every day | ORAL | Status: DC
Start: 2021-05-15 — End: 2021-05-20
  Administered 2021-05-16 – 2021-05-20 (×5): 10 mg via ORAL
  Filled 2021-05-15 (×6): qty 1

## 2021-05-15 MED ORDER — ZINC SULFATE 220 (50 ZN) MG PO CAPS
220.0000 mg | ORAL_CAPSULE | Freq: Every day | ORAL | Status: DC
  Administered 2021-05-16 – 2021-05-20 (×5): 220 mg via ORAL
  Filled 2021-05-15 (×5): qty 1

## 2021-05-15 MED ORDER — POLYVINYL ALCOHOL 1.4 % OP SOLN
1.0000 [drp] | Freq: Three times a day (TID) | OPHTHALMIC | Status: DC | PRN
Start: 2021-05-15 — End: 2021-05-20

## 2021-05-15 MED ORDER — ACETAMINOPHEN 500 MG PO TABS
500.0000 mg | ORAL_TABLET | Freq: Four times a day (QID) | ORAL | Status: AC
  Administered 2021-05-15 – 2021-05-16 (×3): 500 mg via ORAL
  Filled 2021-05-15 (×5): qty 1

## 2021-05-15 MED ORDER — SODIUM CHLORIDE 0.9 % IV SOLN: INTRAVENOUS | Status: DC

## 2021-05-15 MED ORDER — PHENYLEPHRINE HCL-NACL 20-0.9 MG/250ML-% IV SOLN
INTRAVENOUS | Status: DC | PRN
  Administered 2021-05-15: 30 ug/min via INTRAVENOUS

## 2021-05-15 MED ORDER — ACETAMINOPHEN 10 MG/ML IV SOLN
1000.0000 mg | Freq: Once | INTRAVENOUS | Status: DC | PRN
  Administered 2021-05-15: 1000 mg via INTRAVENOUS

## 2021-05-15 MED ORDER — KRILL OIL 1000 MG PO CAPS: 1000.0000 mg | ORAL_CAPSULE | Freq: Every day | ORAL | Status: DC

## 2021-05-15 MED ORDER — HYDROCODONE-ACETAMINOPHEN 7.5-325 MG PO TABS
1.0000 | ORAL_TABLET | ORAL | Status: DC | PRN
  Administered 2021-05-18 – 2021-05-19 (×3): 2 via ORAL
  Administered 2021-05-20: 1 via ORAL
  Filled 2021-05-15 (×3): qty 2
  Filled 2021-05-15: qty 1

## 2021-05-15 MED ORDER — HYDROXYZINE HCL 10 MG PO TABS
20.0000 mg | ORAL_TABLET | Freq: Every day | ORAL | Status: DC
  Administered 2021-05-16 – 2021-05-19 (×5): 20 mg via ORAL
  Filled 2021-05-15 (×7): qty 2

## 2021-05-15 MED ORDER — HYDROCODONE-ACETAMINOPHEN 5-325 MG PO TABS
1.0000 | ORAL_TABLET | ORAL | Status: DC | PRN
  Administered 2021-05-16 – 2021-05-19 (×5): 2 via ORAL
  Administered 2021-05-19: 1 via ORAL
  Administered 2021-05-19 – 2021-05-20 (×2): 2 via ORAL
  Filled 2021-05-15 (×4): qty 2
  Filled 2021-05-15: qty 1
  Filled 2021-05-15: qty 2
  Filled 2021-05-15 (×2): qty 1
  Filled 2021-05-15 (×2): qty 2

## 2021-05-15 MED ORDER — DEXAMETHASONE SODIUM PHOSPHATE 10 MG/ML IJ SOLN
INTRAMUSCULAR | Status: DC | PRN
  Administered 2021-05-15: 5 mg via INTRAVENOUS

## 2021-05-15 MED ORDER — DICLOFENAC SODIUM 1 % EX GEL: 2.0000 g | Freq: Three times a day (TID) | CUTANEOUS | Status: DC | PRN

## 2021-05-15 MED ORDER — TRANEXAMIC ACID-NACL 1000-0.7 MG/100ML-% IV SOLN
1000.0000 mg | Freq: Once | INTRAVENOUS | Status: AC
  Administered 2021-05-15: 1000 mg via INTRAVENOUS
  Filled 2021-05-15: qty 100

## 2021-05-15 MED ORDER — BUMETANIDE 2 MG PO TABS
4.0000 mg | ORAL_TABLET | Freq: Every day | ORAL | Status: DC | PRN
  Filled 2021-05-15: qty 2

## 2021-05-15 MED ORDER — ONDANSETRON HCL 4 MG/2ML IJ SOLN
INTRAMUSCULAR | Status: AC
  Filled 2021-05-15: qty 2

## 2021-05-15 MED ORDER — PRAZOSIN HCL 2 MG PO CAPS
2.0000 mg | ORAL_CAPSULE | Freq: Every day | ORAL | Status: DC
  Administered 2021-05-16: 2 mg via ORAL
  Filled 2021-05-15 (×2): qty 1

## 2021-05-15 MED ORDER — POLYETHYLENE GLYCOL 3350 17 G PO PACK
17.0000 g | PACK | Freq: Every day | ORAL | Status: DC
Start: 2021-05-15 — End: 2021-05-17
  Administered 2021-05-16 – 2021-05-17 (×2): 17 g via ORAL
  Filled 2021-05-15 (×2): qty 1

## 2021-05-15 MED ORDER — MEMANTINE HCL 10 MG PO TABS
20.0000 mg | ORAL_TABLET | Freq: Every day | ORAL | Status: DC
  Administered 2021-05-15 – 2021-05-19 (×5): 20 mg via ORAL
  Filled 2021-05-15 (×6): qty 2

## 2021-05-15 MED ORDER — VITAMIN D 25 MCG (1000 UNIT) PO TABS
2000.0000 [IU] | ORAL_TABLET | Freq: Two times a day (BID) | ORAL | Status: DC
  Administered 2021-05-15 – 2021-05-20 (×10): 2000 [IU] via ORAL
  Filled 2021-05-15 (×10): qty 2

## 2021-05-15 MED ORDER — MAGNESIUM HYDROXIDE 400 MG/5ML PO SUSP: 30.0000 mL | Freq: Every day | ORAL | Status: DC | PRN

## 2021-05-15 MED ORDER — LIDOCAINE 2% (20 MG/ML) 5 ML SYRINGE
INTRAMUSCULAR | Status: AC
  Filled 2021-05-15: qty 5

## 2021-05-15 MED ORDER — ROCURONIUM BROMIDE 10 MG/ML (PF) SYRINGE
PREFILLED_SYRINGE | INTRAVENOUS | Status: AC
  Filled 2021-05-15: qty 10

## 2021-05-15 MED ORDER — ONDANSETRON HCL 4 MG/2ML IJ SOLN: 4.0000 mg | Freq: Once | INTRAMUSCULAR | Status: DC | PRN

## 2021-05-15 MED ORDER — CHLORHEXIDINE GLUCONATE 0.12 % MT SOLN
OROMUCOSAL | Status: AC
  Administered 2021-05-15: 15 mL
  Filled 2021-05-15: qty 15

## 2021-05-15 MED ORDER — PROPOFOL 10 MG/ML IV BOLUS
INTRAVENOUS | Status: AC
  Filled 2021-05-15: qty 20

## 2021-05-15 MED ORDER — BUPRENORPHINE HCL-NALOXONE HCL 2-0.5 MG SL SUBL
1.0000 | SUBLINGUAL_TABLET | Freq: Every day | SUBLINGUAL | Status: AC
  Administered 2021-05-16 – 2021-05-18 (×3): 1 via SUBLINGUAL
  Filled 2021-05-15 (×4): qty 1

## 2021-05-15 MED ORDER — MORPHINE SULFATE (PF) 2 MG/ML IV SOLN: 0.5000 mg | INTRAVENOUS | Status: DC | PRN

## 2021-05-15 MED ORDER — MORPHINE SULFATE (PF) 2 MG/ML IV SOLN
0.5000 mg | INTRAVENOUS | Status: DC | PRN
  Administered 2021-05-15 – 2021-05-19 (×3): 1 mg via INTRAVENOUS
  Administered 2021-05-20 (×2): 0.5 mg via INTRAVENOUS
  Filled 2021-05-15 (×5): qty 1

## 2021-05-15 MED ORDER — MEMANTINE HCL 10 MG PO TABS: 10.0000 mg | ORAL_TABLET | ORAL | Status: DC

## 2021-05-15 MED ORDER — FENTANYL CITRATE (PF) 250 MCG/5ML IJ SOLN
INTRAMUSCULAR | Status: DC | PRN
  Administered 2021-05-15 (×2): 25 ug via INTRAVENOUS
  Administered 2021-05-15: 50 ug via INTRAVENOUS

## 2021-05-15 MED ORDER — METOCLOPRAMIDE HCL 5 MG PO TABS: 5.0000 mg | ORAL_TABLET | Freq: Three times a day (TID) | ORAL | Status: DC | PRN

## 2021-05-15 MED ORDER — ADULT MULTIVITAMIN W/MINERALS CH
1.0000 | ORAL_TABLET | Freq: Every day | ORAL | Status: DC
  Administered 2021-05-16 – 2021-05-20 (×5): 1 via ORAL
  Filled 2021-05-15 (×5): qty 1

## 2021-05-15 MED ORDER — DEXAMETHASONE SODIUM PHOSPHATE 10 MG/ML IJ SOLN
INTRAMUSCULAR | Status: AC
  Filled 2021-05-15: qty 1

## 2021-05-15 MED ORDER — ASPIRIN EC 81 MG PO TBEC
81.0000 mg | DELAYED_RELEASE_TABLET | Freq: Every day | ORAL | Status: DC
  Administered 2021-05-16 – 2021-05-20 (×5): 81 mg via ORAL
  Filled 2021-05-15 (×5): qty 1

## 2021-05-15 MED ORDER — CEFAZOLIN SODIUM-DEXTROSE 2-3 GM-%(50ML) IV SOLR
INTRAVENOUS | Status: DC | PRN
  Administered 2021-05-15: 2 g via INTRAVENOUS

## 2021-05-15 MED ORDER — ASCORBIC ACID 500 MG PO TABS
250.0000 mg | ORAL_TABLET | Freq: Two times a day (BID) | ORAL | Status: DC
  Administered 2021-05-15 – 2021-05-19 (×6): 250 mg via ORAL
  Filled 2021-05-15 (×10): qty 1

## 2021-05-15 MED ORDER — SUGAMMADEX SODIUM 200 MG/2ML IV SOLN
INTRAVENOUS | Status: DC | PRN
  Administered 2021-05-15: 200 mg via INTRAVENOUS

## 2021-05-15 MED ORDER — PANCRELIPASE (LIP-PROT-AMYL) 36000-114000 UNITS PO CPEP
36000.0000 [IU] | ORAL_CAPSULE | Freq: Three times a day (TID) | ORAL | Status: DC
  Administered 2021-05-16 – 2021-05-20 (×12): 36000 [IU] via ORAL
  Filled 2021-05-15 (×14): qty 1

## 2021-05-15 MED ORDER — DOXEPIN HCL 10 MG PO CAPS
10.0000 mg | ORAL_CAPSULE | Freq: Every day | ORAL | Status: DC
  Administered 2021-05-16 – 2021-05-19 (×5): 10 mg via ORAL
  Filled 2021-05-15 (×7): qty 1

## 2021-05-15 MED ORDER — PHENOL 1.4 % MT LIQD: 1.0000 | OROMUCOSAL | Status: DC | PRN

## 2021-05-15 MED ORDER — SENNOSIDES-DOCUSATE SODIUM 8.6-50 MG PO TABS
2.0000 | ORAL_TABLET | Freq: Two times a day (BID) | ORAL | Status: DC
  Administered 2021-05-15 – 2021-05-19 (×8): 2 via ORAL
  Filled 2021-05-15 (×8): qty 2

## 2021-05-15 MED ORDER — DOCUSATE SODIUM 100 MG PO CAPS
100.0000 mg | ORAL_CAPSULE | Freq: Two times a day (BID) | ORAL | Status: DC
  Administered 2021-05-15: 100 mg via ORAL
  Filled 2021-05-15 (×2): qty 1

## 2021-05-15 MED ORDER — FENTANYL CITRATE (PF) 100 MCG/2ML IJ SOLN
INTRAMUSCULAR | Status: AC
  Filled 2021-05-15: qty 2

## 2021-05-15 MED ORDER — LIDOCAINE 2% (20 MG/ML) 5 ML SYRINGE
INTRAMUSCULAR | Status: DC | PRN
Start: 2021-05-15 — End: 2021-05-15
  Administered 2021-05-15: 80 mg via INTRAVENOUS

## 2021-05-15 MED ORDER — ENOXAPARIN SODIUM 30 MG/0.3ML IJ SOSY
30.0000 mg | PREFILLED_SYRINGE | Freq: Two times a day (BID) | INTRAMUSCULAR | Status: DC
  Administered 2021-05-16 – 2021-05-20 (×9): 30 mg via SUBCUTANEOUS
  Filled 2021-05-15 (×9): qty 0.3

## 2021-05-15 MED ORDER — FOLIC ACID 1 MG PO TABS
1.0000 mg | ORAL_TABLET | Freq: Every day | ORAL | Status: DC
  Administered 2021-05-16 – 2021-05-20 (×5): 1 mg via ORAL
  Filled 2021-05-15 (×5): qty 1

## 2021-05-15 MED ORDER — TRANEXAMIC ACID-NACL 1000-0.7 MG/100ML-% IV SOLN
INTRAVENOUS | Status: AC
Start: 2021-05-15 — End: 2021-05-15
  Filled 2021-05-15: qty 100

## 2021-05-15 MED ORDER — VASOPRESSIN 20 UNIT/ML IV SOLN
INTRAVENOUS | Status: DC | PRN
  Administered 2021-05-15: 1 [IU] via INTRAVENOUS
  Administered 2021-05-15 (×2): 2 [IU] via INTRAVENOUS

## 2021-05-15 MED ORDER — LOPERAMIDE HCL 2 MG PO CAPS: 2.0000 mg | ORAL_CAPSULE | Freq: Three times a day (TID) | ORAL | Status: DC | PRN

## 2021-05-15 MED ORDER — SALINE SPRAY 0.65 % NA SOLN
1.0000 | Freq: Every day | NASAL | Status: DC
  Administered 2021-05-15 – 2021-05-19 (×5): 1 via NASAL
  Filled 2021-05-15: qty 44

## 2021-05-15 MED ORDER — CEFAZOLIN SODIUM-DEXTROSE 2-4 GM/100ML-% IV SOLN
2.0000 g | Freq: Four times a day (QID) | INTRAVENOUS | Status: AC
  Administered 2021-05-15 – 2021-05-16 (×2): 2 g via INTRAVENOUS
  Filled 2021-05-15 (×2): qty 100

## 2021-05-15 MED ORDER — VASOPRESSIN 20 UNIT/ML IV SOLN
INTRAVENOUS | Status: AC
  Filled 2021-05-15: qty 1

## 2021-05-15 MED ORDER — DULOXETINE HCL 30 MG PO CPEP
30.0000 mg | ORAL_CAPSULE | Freq: Every day | ORAL | Status: DC
  Administered 2021-05-15 – 2021-05-19 (×5): 30 mg via ORAL
  Filled 2021-05-15 (×6): qty 1

## 2021-05-15 MED ORDER — ALBUMIN HUMAN 5 % IV SOLN: INTRAVENOUS | Status: DC | PRN

## 2021-05-15 MED ORDER — FENTANYL CITRATE PF 50 MCG/ML IJ SOSY
50.0000 ug | PREFILLED_SYRINGE | Freq: Once | INTRAMUSCULAR | Status: AC
  Administered 2021-05-15: 50 ug via INTRAVENOUS
  Filled 2021-05-15: qty 1

## 2021-05-15 MED ORDER — ONDANSETRON HCL 4 MG/2ML IJ SOLN
4.0000 mg | Freq: Four times a day (QID) | INTRAMUSCULAR | Status: DC | PRN
  Filled 2021-05-15: qty 2

## 2021-05-15 MED ORDER — TRANEXAMIC ACID-NACL 1000-0.7 MG/100ML-% IV SOLN
INTRAVENOUS | Status: DC | PRN
  Administered 2021-05-15: 1000 mg via INTRAVENOUS

## 2021-05-15 MED ORDER — ACETAMINOPHEN 325 MG PO TABS
325.0000 mg | ORAL_TABLET | Freq: Four times a day (QID) | ORAL | Status: DC | PRN
  Administered 2021-05-16 – 2021-05-18 (×3): 650 mg via ORAL
  Filled 2021-05-15 (×3): qty 2
  Filled 2021-05-15: qty 1

## 2021-05-15 MED ORDER — 0.9 % SODIUM CHLORIDE (POUR BTL) OPTIME
TOPICAL | Status: DC | PRN
Start: 2021-05-15 — End: 2021-05-15
  Administered 2021-05-15: 1000 mL

## 2021-05-15 MED ORDER — PROPOFOL 10 MG/ML IV BOLUS
INTRAVENOUS | Status: DC | PRN
  Administered 2021-05-15: 80 mg via INTRAVENOUS

## 2021-05-15 MED ORDER — VITAMIN B-12 100 MCG PO TABS
50.0000 ug | ORAL_TABLET | Freq: Every day | ORAL | Status: DC
  Administered 2021-05-16 – 2021-05-20 (×5): 50 ug via ORAL
  Filled 2021-05-15 (×6): qty 1

## 2021-05-15 MED ORDER — PHENYLEPHRINE 40 MCG/ML (10ML) SYRINGE FOR IV PUSH (FOR BLOOD PRESSURE SUPPORT)
PREFILLED_SYRINGE | INTRAVENOUS | Status: DC | PRN
  Administered 2021-05-15: 120 ug via INTRAVENOUS
  Administered 2021-05-15: 80 ug via INTRAVENOUS
  Administered 2021-05-15: 120 ug via INTRAVENOUS
  Administered 2021-05-15: 80 ug via INTRAVENOUS

## 2021-05-15 MED ORDER — ACETAMINOPHEN 10 MG/ML IV SOLN
INTRAVENOUS | Status: AC
  Filled 2021-05-15: qty 100

## 2021-05-15 MED ORDER — GALANTAMINE HYDROBROMIDE ER 8 MG PO CP24
16.0000 mg | ORAL_CAPSULE | Freq: Every day | ORAL | Status: DC
  Administered 2021-05-15 – 2021-05-19 (×5): 16 mg via ORAL
  Filled 2021-05-15 (×6): qty 2

## 2021-05-15 MED ORDER — LACTATED RINGERS IV SOLN
INTRAVENOUS | Status: DC | PRN
Start: 2021-05-15 — End: 2021-05-15

## 2021-05-15 SURGICAL SUPPLY — 45 items
ADH SKN CLS APL DERMABOND .7 (GAUZE/BANDAGES/DRESSINGS) ×1
APL PRP STRL LF DISP 70% ISPRP (MISCELLANEOUS) ×1
BAG COUNTER SPONGE SURGICOUNT (BAG) ×2 IMPLANT
BAG SPNG CNTER NS LX DISP (BAG) ×1
BIT DRILL CALIBRATED 4.2 (BIT) IMPLANT
BIT DRILL CANN 16 HIP (BIT) ×1 IMPLANT
BNDG COHESIVE 6X5 TAN NS LF (GAUZE/BANDAGES/DRESSINGS) ×2 IMPLANT
CHLORAPREP W/TINT 26 (MISCELLANEOUS) ×2 IMPLANT
COVER PERINEAL POST (MISCELLANEOUS) ×2 IMPLANT
COVER SURGICAL LIGHT HANDLE (MISCELLANEOUS) ×2 IMPLANT
DERMABOND ADVANCED (GAUZE/BANDAGES/DRESSINGS) ×1
DERMABOND ADVANCED .7 DNX12 (GAUZE/BANDAGES/DRESSINGS) ×2 IMPLANT
DRAPE C-ARM 42X72 X-RAY (DRAPES) ×2 IMPLANT
DRAPE C-ARMOR (DRAPES) ×2 IMPLANT
DRAPE STERI IOBAN 125X83 (DRAPES) ×2 IMPLANT
DRILL BIT CALIBRATED 4.2 (BIT) ×2
DRSG AQUACEL AG ADV 3.5X 4 (GAUZE/BANDAGES/DRESSINGS) ×1 IMPLANT
DRSG AQUACEL AG ADV 3.5X 6 (GAUZE/BANDAGES/DRESSINGS) ×1 IMPLANT
DRSG MEPILEX BORDER 4X8 (GAUZE/BANDAGES/DRESSINGS) ×4 IMPLANT
ELECT REM PT RETURN 9FT ADLT (ELECTROSURGICAL) ×2
ELECTRODE REM PT RTRN 9FT ADLT (ELECTROSURGICAL) ×1 IMPLANT
GLOVE SRG 8 PF TXTR STRL LF DI (GLOVE) ×1 IMPLANT
GLOVE SURG ORTHO LTX SZ7.5 (GLOVE) ×4 IMPLANT
GLOVE SURG UNDER POLY LF SZ8 (GLOVE) ×2
GOWN STRL REUS W/ TWL LRG LVL3 (GOWN DISPOSABLE) ×1 IMPLANT
GOWN STRL REUS W/ TWL XL LVL3 (GOWN DISPOSABLE) ×1 IMPLANT
GOWN STRL REUS W/TWL LRG LVL3 (GOWN DISPOSABLE) ×2
GOWN STRL REUS W/TWL XL LVL3 (GOWN DISPOSABLE) ×2
GUIDEWIRE 3.2X400 (WIRE) ×1 IMPLANT
KIT BASIN OR (CUSTOM PROCEDURE TRAY) ×2 IMPLANT
MANIFOLD NEPTUNE II (INSTRUMENTS) ×2 IMPLANT
NAIL TROCH FIX 10X235 RT 130 (Nail) ×1 IMPLANT
NS IRRIG 1000ML POUR BTL (IV SOLUTION) ×2 IMPLANT
PACK GENERAL/GYN (CUSTOM PROCEDURE TRAY) ×2 IMPLANT
PAD ARMBOARD 7.5X6 YLW CONV (MISCELLANEOUS) ×4 IMPLANT
SCREW LOCK IM TI 5X40 STRL (Screw) ×1 IMPLANT
SCREW TFNA FENS 10.35X125 NS (Screw) ×1 IMPLANT
SUT MNCRL+ AB 3-0 CT1 36 (SUTURE) ×2 IMPLANT
SUT MON AB 2-0 CT1 36 (SUTURE) ×4 IMPLANT
SUT MONOCRYL AB 3-0 CT1 36IN (SUTURE) ×2
SUT PDS AB 1 CT  36 (SUTURE) ×2
SUT PDS AB 1 CT 36 (SUTURE) ×2 IMPLANT
SUT VIC AB 0 CT1 27 (SUTURE) ×4
SUT VIC AB 0 CT1 27XBRD ANBCTR (SUTURE) ×2 IMPLANT
TOWEL GREEN STERILE (TOWEL DISPOSABLE) ×4 IMPLANT

## 2021-05-15 NOTE — ED Notes (Signed)
Trauma Event Note ? ? ? ?Upgraded to a level one trauma by Dr. Nicanor Alcon for hypotension. Dr. Bedelia Person to bedside, verbal order for one unit PRBCs emergency release. Escorted back to CT for chest abdomen pelvis. BP improved after NS bolus and blood products. Plan of care to admit to medicine, ortho consult.  ? ?Last imported Vital Signs ?BP 103/73   Pulse 73   Temp 98.7 ?F (37.1 ?C) (Oral)   Resp 18   Ht 5\' 11"  (1.803 m)   Wt 170 lb (77.1 kg)   SpO2 99%   BMI 23.71 kg/m?  ? ?Trending CBC ?Recent Labs  ?  05/14/21 ?2239  ?WBC 5.2  ?HGB 11.8*  ?HCT 37.0*  ?PLT 124*  ? ? ?Trending Coag's ?Recent Labs  ?  05/14/21 ?2239  ?INR 1.2  ? ? ?Trending BMET ?Recent Labs  ?  05/14/21 ?2239  ?NA 139  ?K 4.2  ?CL 103  ?CO2 29  ?BUN 13  ?CREATININE 0.82  ?GLUCOSE 98  ? ? ? ? ?Jack Neal  ?Trauma Response RN ? ?Please call TRN at [phone] for further assistance. ? ? ?  ?

## 2021-05-15 NOTE — Discharge Instructions (Signed)
Discharge instructions for Dr. Georgeanna Harrison, M.D., Orthopaedic Surgeon, [place]: ? ?Diet: As you were doing prior to hospitalization, unless instructed otherwise by medical team, dietary/nutrition team, etc. ?Dressing:  Keep AQUACEL on and dry for up to 14 days.  Do not allow surgical area to get wet before that.  Remove AQUACEL dressing after 14 days and allow area to get wet in shower but DO NOT SUBMERGE until wound is evaluated in clinic.  In most cases skin glue is used and no additional dressing is necessary.  ?Shower:  Unless otherwise specified, may shower but keep the wounds dry, use an occlusive plastic wrap, NO SOAKING IN TUB.  If the bandage gets wet, change with a clean dry gauze.  Unless otherwise specified, after 5 days dressing(s) should be removed (7 days for PREVENA dressings) and wound(s) may get wet in the shower by allowing water to gently run over.  Again, no soaking in tub, and do NOT submerge for at least 2 weeks!!! ?Activity:  Increase activity slowly as tolerated.  If you right leg is injured or immobilized, no driving for 6 weeks or until discussed with your surgeon.  If you have an injury or immobilization of the left lower extremity you may not operate a clutch. Please note that driving with any kind of immobilization for the upper extremity (sling, shoulder brace, splint, cast, etc.) may also be considered impaired driving and should not be attempted. ?Weight Bearing: WEIGHTBEARING AS TOLERATED (WBAT) on the RIGHT UPPER EXTREMITY and RIGHT LOWER EXTREMITY.  No abduction or forward flexion over 90 degrees in right upper extremity due to acromion fracture. ?To prevent constipation: You may use over-the-counter stool softener(s) such as Colace (over the counter) 100 mg by mouth twice a day and/or Miralax (over the counter) for constipation as needed.  Drink plenty of fluids (prune juice may be helpful) and high fiber foods.  ?Itching:  If you  experience itching with your medications, try taking only a single pain pill, or even half a pain pill at a time.  You can also use benadryl over the counter for itching or also to help with sleep.  ?Precautions:  If you experience chest pain or shortness of breath - call 911 immediately for transfer to the hospital emergency department!! ?Medications: Please contact the clinic during office hours (Monday through Friday, 0800 to 1600) if you need a refill on any medications.  Please monitor medications and allow 24 to 48 hours to process refill request!!!!  Please note that only medications directly related to the surgery can be prescribed.  For other medications (e.g. blood pressure medicines, sleeping medicines, etc.), please contact the prescribing physician or your primary care provider. ?DVT Prophylaxis: Lovenox 30 mg BID x6 weeks ? ?If you develop a fever greater that 101.1 deg F, purulent drainage from wound, increased redness or drainage from wound, or calf pain -- Call the office at [phone].  ?

## 2021-05-15 NOTE — Assessment & Plan Note (Addendum)
Continue pancrelipase

## 2021-05-15 NOTE — Consult Note (Signed)
[facility] H&P  05/15/2021, 12:14 AM   Chief Complaint: Level 1 trauma activation for hypotension   The patient is an 76 y.o. male.   HPI: 26M s/p mechanical GLF. Denies LOC Takes plavix. L2A, found to have R acromion fx and R intertrochanteric femure fracture on XR. Sent to CT where he underwent imaging of head and cervical spine. After return from imaging, became hypotensive and was upgraded to level 1 activation. I ordered for 1u pRBC to be transfused and stat CT CAP.   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   Watchman FLX 20   PTSD (post-traumatic stress disorder) 10/01/2020    Past Surgical History:  Procedure Laterality Date   LEFT ATRIAL APPENDAGE OCCLUSION N/A 01/02/2021   Procedure: LEFT ATRIAL APPENDAGE OCCLUSION;  Surgeon: Vickie Epley, MD;  Location: [facility];  Service: Cardiovascular;  Laterality: N/A;   TEE WITHOUT CARDIOVERSION N/A 01/02/2021   Procedure: TRANSESOPHAGEAL ECHOCARDIOGRAM (TEE);  Surgeon: Vickie Epley, MD;  Location: [facility];  Service: Cardiovascular;  Laterality: N/A;   TEE WITHOUT CARDIOVERSION N/A 02/17/2021   Procedure: TRANSESOPHAGEAL ECHOCARDIOGRAM (TEE);  Surgeon: Geralynn Rile, MD;  Location: [facility];  Service: Cardiovascular;  Laterality: N/A;    No pertinent family history.  Social History:  reports that he has quit smoking. His smoking use included cigarettes. He has never used smokeless tobacco. He  reports that he does not currently use alcohol. He reports that he does not use drugs.     Allergies:  Allergies  Allergen Reactions   Amphetamine Rash and Other (See Comments)    Altered mental status- amnesia (also)   Diazepam Rash    Other reaction(s): Amnesia, Mental Status Changes (intolerance), Other amnesia    Zoloft [Sertraline] Anxiety, Rash and Other (See Comments)    Panic attacks and Cardiovascular Arrest (ALSO)    Gabapentin     Tremors/jerking, hallucinations, dry mouth   Gluten Meal Diarrhea   Penicillin G Rash    Medications: reviewed  Results for orders placed or performed during the hospital encounter of 05/14/21 (from the past 48 hour(s))  Comprehensive metabolic panel     Status: Abnormal   Collection Time: 05/14/21 10:39 PM  Result Value Ref Range   Sodium 139 135 - 145 mmol/L   Potassium 4.2 3.5 - 5.1 mmol/L   Chloride 103 98 - 111 mmol/L   CO2 29 22 - 32 mmol/L   Glucose, Bld 98 70 - 99 mg/dL    Comment: Glucose reference range applies only to samples taken after fasting for at least 8 hours.   BUN 13 8 - 23 mg/dL   Creatinine, Ser 0.82 0.61 - 1.24 mg/dL   Calcium 8.5 (L) 8.9 - 10.3 mg/dL   Total Protein 6.0 (L) 6.5 - 8.1 g/dL   Albumin 3.4 (L) 3.5 - 5.0 g/dL   AST 20 15 - 41 U/L   ALT 12 0 - 44 U/L   Alkaline  Phosphatase 143 (H) 38 - 126 U/L   Total Bilirubin 1.2 0.3 - 1.2 mg/dL   GFR, Estimated >60 >60 mL/min    Comment: (NOTE) Calculated using the CKD-EPI Creatinine Equation (2021)    Anion gap 7 5 - 15    Comment: Performed at [facility] [address], [place], [place] [address]  CBC     Status: Abnormal   Collection Time: 05/14/21 10:39 PM  Result Value Ref Range   WBC 5.2 4.0 - 10.5 K/uL   RBC 3.59 (L) 4.22 - 5.81 MIL/uL   Hemoglobin 11.8 (L) 13.0 - 17.0 g/dL   HCT 37.0 (L) 39.0 - 52.0 %   MCV 103.1 (H) 80.0 - 100.0 fL   MCH 32.9 26.0 - 34.0 pg   MCHC 31.9 30.0 - 36.0 g/dL   RDW 13.9 11.5 - 15.5 %   Platelets 124 (L) 150  - 400 K/uL    Comment: Immature Platelet Fraction may be clinically indicated, consider ordering this additional test [id number] REPEATED TO VERIFY    nRBC 0.0 0.0 - 0.2 %    Comment: Performed at [facility], [place] [address], [place], [place] [address]  Ethanol     Status: None   Collection Time: 05/14/21 10:39 PM  Result Value Ref Range   Alcohol, Ethyl (B) <10 <10 mg/dL    Comment: (NOTE) Lowest detectable limit for serum alcohol is 10 mg/dL.  For medical purposes only. Performed at [facility], [place] [address] [place], [place] [address]   Protime-INR     Status: None   Collection Time: 05/14/21 10:39 PM  Result Value Ref Range   Prothrombin Time 15.2 11.4 - 15.2 seconds   INR 1.2 0.8 - 1.2    Comment: (NOTE) INR goal varies based on device and disease states. Performed at [facility], [place] [address] [place], [place] [address]   Sample to Blood Bank     Status: None   Collection Time: 05/14/21 10:50 PM  Result Value Ref Range   Blood Bank Specimen SAMPLE AVAILABLE FOR TESTING    Sample Expiration      05/15/2021,2359 Performed at [facility], [place] [address], [place], [place] [address]   Type and screen Ordered by PROVIDER DEFAULT     Status: None (Preliminary result)   Collection Time: 05/14/21 10:50 PM  Result Value Ref Range   ABO/RH(D) B POS    Antibody Screen NEG    Sample Expiration      05/17/2021,2359 Performed at [facility], [place] [address], [place], [place] [address]    Unit Number [id number]    Blood Component Type RED CELLS,LR    Unit division 00    Status of Unit ISSUED    Transfusion Status OK TO TRANSFUSE    Crossmatch Result COMPATIBLE     DG Shoulder Right  Result Date: 05/14/2021 CLINICAL DATA:  fall EXAM: RIGHT SHOULDER - 2+ VIEW COMPARISON:  Chest x-ray 01/02/2021 FINDINGS: Acute minimally displaced fracture of the acromion. No other acute fracture identified. No shoulder dislocation. There is no  evidence of arthropathy or other focal bone abnormality. Soft tissues are unremarkable. IMPRESSION: Acute minimally displaced fracture of the acromion. Electronically Signed   By: Iven Finn M.D.   On: 05/14/2021 23:23   CT HEAD WO CONTRAST (5MM)  Result Date: 05/14/2021 CLINICAL DATA:  Polytrauma, blunt; Head trauma, minor (Age >= 65y) EXAM: CT HEAD WITHOUT CONTRAST CT CERVICAL  SPINE WITHOUT CONTRAST TECHNIQUE: Multidetector CT imaging of the head and cervical spine was performed following the standard protocol without intravenous contrast. Multiplanar CT image reconstructions of the cervical spine were also generated. RADIATION DOSE REDUCTION: This exam was performed according to the departmental dose-optimization program which includes automated exposure control, adjustment of the mA and/or kV according to patient size and/or use of iterative reconstruction technique. COMPARISON:  None. FINDINGS: CT HEAD FINDINGS BRAIN: BRAIN Cerebral ventricle sizes are concordant with the degree of cerebral volume loss. Patchy and confluent areas of decreased attenuation are noted throughout the deep and periventricular white matter of the cerebral hemispheres bilaterally, compatible with chronic microvascular ischemic disease. No evidence of large-territorial acute infarction. No parenchymal hemorrhage. No mass lesion. No extra-axial collection. No mass effect or midline shift. No hydrocephalus. Basilar cisterns are patent. Vascular: No hyperdense vessel. Atherosclerotic calcifications are present within the cavernous internal carotid arteries. Skull: No acute fracture or focal lesion. Sinuses/Orbits: Paranasal sinuses and mastoid air cells are clear. Bilateral lens replacement. Otherwise the orbits are unremarkable. Other: None. CT CERVICAL SPINE FINDINGS Alignment: Grade anterolisthesis C2 on C3. Skull base and vertebrae: Diffusely decreased bone density. Multilevel severe degenerative changes of the spine with C4-C5  vertebral body fusion. No acute fracture. No aggressive appearing focal osseous lesion or focal pathologic process. Soft tissues and spinal canal: No prevertebral fluid or swelling. No visible canal hematoma. Upper chest: Unremarkable. Other: None. IMPRESSION: 1. No acute intracranial abnormality. 2. No acute displaced fracture or traumatic listhesis of the cervical spine. 3. Diffusely decreased bone density. Electronically Signed   By: Iven Finn M.D.   On: 05/14/2021 23:32   CT Cervical Spine Wo Contrast  Result Date: 05/14/2021 CLINICAL DATA:  Polytrauma, blunt; Head trauma, minor (Age >= 65y) EXAM: CT HEAD WITHOUT CONTRAST CT CERVICAL SPINE WITHOUT CONTRAST TECHNIQUE: Multidetector CT imaging of the head and cervical spine was performed following the standard protocol without intravenous contrast. Multiplanar CT image reconstructions of the cervical spine were also generated. RADIATION DOSE REDUCTION: This exam was performed according to the departmental dose-optimization program which includes automated exposure control, adjustment of the mA and/or kV according to patient size and/or use of iterative reconstruction technique. COMPARISON:  None. FINDINGS: CT HEAD FINDINGS BRAIN: BRAIN Cerebral ventricle sizes are concordant with the degree of cerebral volume loss. Patchy and confluent areas of decreased attenuation are noted throughout the deep and periventricular white matter of the cerebral hemispheres bilaterally, compatible with chronic microvascular ischemic disease. No evidence of large-territorial acute infarction. No parenchymal hemorrhage. No mass lesion. No extra-axial collection. No mass effect or midline shift. No hydrocephalus. Basilar cisterns are patent. Vascular: No hyperdense vessel. Atherosclerotic calcifications are present within the cavernous internal carotid arteries. Skull: No acute fracture or focal lesion. Sinuses/Orbits: Paranasal sinuses and mastoid air cells are clear.  Bilateral lens replacement. Otherwise the orbits are unremarkable. Other: None. CT CERVICAL SPINE FINDINGS Alignment: Grade anterolisthesis C2 on C3. Skull base and vertebrae: Diffusely decreased bone density. Multilevel severe degenerative changes of the spine with C4-C5 vertebral body fusion. No acute fracture. No aggressive appearing focal osseous lesion or focal pathologic process. Soft tissues and spinal canal: No prevertebral fluid or swelling. No visible canal hematoma. Upper chest: Unremarkable. Other: None. IMPRESSION: 1. No acute intracranial abnormality. 2. No acute displaced fracture or traumatic listhesis of the cervical spine. 3. Diffusely decreased bone density. Electronically Signed   By: Iven Finn M.D.   On: 05/14/2021 23:32   DG Pelvis Portable  Result  Date: 05/14/2021 CLINICAL DATA:  trauma EXAM: PORTABLE PELVIS 1-2 VIEWS COMPARISON:  None. FINDINGS: Acute comminuted displaced intertrochanteric fracture of the right femur. Cortical irregularity versus overlying bowel of the right iliac bone. Query slight asymmetry of the right sacroiliac joint compared to the left. Partially visualized left hip grossly unremarkable on frontal view. No right hip dislocation. No pelvic bone lesions are seen. IMPRESSION: 1. Acute comminuted displaced intertrochanteric fracture of the right femur. 2. Cortical irregularity versus overlying bowel of the right iliac bone. 3. Query slight asymmetry of the right sacroiliac joint compared to the left. Electronically Signed   By: Iven Finn M.D.   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 Electronically Signed   By: Iven Finn M.D.   On: 05/14/2021 23:27   Result Date: 05/14/2021 CLINICAL DATA:  trauma EXAM: PORTABLE CHEST 1 VIEW.  Patient is rotated. COMPARISON:  CT heart 11/11/2020  FINDINGS: Left chest wall 2 lead cardiac pacemaker in grossly appropriate position. Cardiomegaly. The heart and mediastinal contours are unchanged. Query Amplatzer device overlying the heart. Slightly elevated left hemidiaphragm. Left base atelectasis. No focal consolidation. No pulmonary edema. No pleural effusion. No pneumothorax. No acute osseous abnormality. IMPRESSION: No active disease. Electronically Signed: By: Iven Finn M.D. On: 05/14/2021 23:14    ROS 10 point review of systems is negative except as listed above in HPI.  Blood pressure 124/79, pulse 94, temperature 98.7 F (37.1 C), temperature source Oral, resp. rate (!) 21, height 5\' 11"  (1.803 m), weight 77.1 kg, SpO2 98 %.  Secondary Survey:  GCS: E(4)//V(5)//M(6) Constitutional: well-developed, well-nourished Skull: normocephalic, atraumatic Eyes: pupils equal, round, reactive to light, 29mm b/l, moist conjunctiva Face/ENT: midface stable without deformity, poor  dentition, external inspection of ears and nose normal, hearing diminished  Oropharynx: normal oropharyngeal mucosa, no blood,   Neck: no thyromegaly, trachea midline, c-collar in place on arrival, unable to assess midline cervical tenderness to palpation, no C-spine stepoffs Chest: breath sounds equal bilaterally, normal  respiratory effort, no midline or lateral chest wall tenderness to palpation/deformity Abdomen: soft, NT, no bruising, no hepatosplenomegaly FAST: not performed Pelvis: stable GU: no blood at urethral meatus of penis, no scrotal masses or abnormality Back: no wounds, unable to assess T/L spine TTP,  UTA  T/L spine stepoffs Rectal: deferred Extremities: 2+  radial and pedal pulses bilaterally, intact motor and sensation of bilateral UE and LE, no peripheral edema MSK: unable to assess gait/station, no clubbing/cyanosis of fingers/toes, limited ROM of all RUE/RLE Skin: warm, dry, no rashes  CXR in TB: negative Pelvis XR in TB: R  intertrochanteric femur fx    Assessment/Plan: Problem List Mechanical GLF  Plan R intertrochanteric femur fx - ortho c/s R acromion fx - ortho c/s Hypotension - resolved with 1u pRBC, no active extrav on CT FEN - NPO until ortho c/s DVT - SCDs, hold chemical ppx  Dispo -  recommend admission by internal medicine service  Critical care time: 78min  Family update: at bedside  Jesusita Oka, MD General and [facility]

## 2021-05-15 NOTE — Assessment & Plan Note (Addendum)
Continue [facility] and [facility]..  Stable. ?

## 2021-05-15 NOTE — Op Note (Signed)
OPERATIVE NOTE  Merton Osler male 76 y.o. 05/15/2021  PREOPERATIVE DIAGNOSIS: Right intertrochanteric hip fracture Osteoporosis with current right femur insufficiency fracture Right acromion fracture  POSTOPERATIVE DIAGNOSIS: Right intertrochanteric hip fracture Osteoporosis with current right femur insufficiency fracture Right acromion fracture  PROCEDURE(S): Open treatment of right intertrochanteric femoral fracture; with intramedullary implant, with interlocking screws [id number]) Operative use of fluoroscopy for above procedure(s) [id number])   SURGEON: Georgeanna Harrison, M.D.  ASSISTANT(S): None  ANESTHESIA: Choice  FINDINGS: Preoperative Examination: RUE: Ecchymosis over posterior lateral corner of acromion.  Tender to palpation in this region the site of the fracture.  No pain with shoulder range of motion.  Normal EDC, FDP index and small finger, APB, and dorsal interossei function.  Sensation tact light touch in the median, radial, and ulnar distributions.  Warm well-perfused distally. LLE: Severe hip pain with any motion of the lower extremity.  Tender palpation over the fracture site.  Normal dorsiflexion, plantarflexion, and EHL function.  Sensation tact light touch in the superficial peroneal, deep peroneal, and tibial distributions.  Warm and well-perfused distally.  Operative Findings: Right hip intertrochanteric fracture.  Reduction and restoration of alignment and neck-shaft angle restored through ligamentotaxis, confirmed fluoroscopically.  Appropriate placement of intramedullary fixation with stable maintenance of reduction.  IMPLANTS: Implant Name Type Inv. Item Serial No. Manufacturer Lot No. LRB No. Used Action  NAIL TROCH FIX [id number] RT 130 - [id number] Nail NAIL TROCH FIX [id number] RT 130  [employer]PU[employer]TH[employer] [id number] Right 1 Implanted  TFN-ADVANCED SYSTEM (TFNA)SCREW    Synthes  Right 1 Implanted  SCREW LOCK IM TI 5X40 STRL - [id number] Screw SCREW LOCK IM TI 5X40 STRL  DEPUY  ORTHOPAEDICS [id number] Right 1 Implanted    INDICATIONS:  The patient is a 76 y.o. male who sustained a mechanical fall in his home, resulting in a right intertrochanteric hip fracture as well as a fracture of the posterolateral corner of the right acromion.  Previously was ambulatory without assistive devices.  After the injury was unable to bear weight.  He understood the risks, benefits and alternatives to surgery which include but are not limited to bleeding, wound healing complications, infection, damage to surrounding structures, persistent pain, stiffness, lack of improvement, potential for subsequent arthritis or worsening of pre-existing arthritis, nonunion, malunion, and need for further surgery, as well as complications related to anesthesia, cardiovascular complications, and death.  He also understood the potential for continued pain in that there were no guarantees of acceptable outcome.  After weighing these risks the patient opted to proceed with surgery.  TECHNIQUE: Patient was identified in the preoperative holding area.  The right hip and thigh was marked by myself.  Consent was signed by myself and the patient.  No block was performed by anesthesia in the preoperative holding area.  Patient was taken to the operative suite and placed supine on the operative table.  Anesthesia was induced by the anesthesia team.  The patient was positioned appropriately for the procedure and all bony prominences were well padded.  A tourniquet was not used.  Preoperative antibiotics were given. The extremity was prepped and draped in the usual sterile fashion and surgical timeout was performed.  Close reduction was performed using ligamentotaxis on the Hana table.  Reduction of the fracture was confirmed on orthogonal AP and lateral fluoroscopic views.  Bony anatomy was marked out on the lateral thigh, and a curvilinear incision extending proximally from the tip of the trochanter was marked out on the skin.   Skin was  incised sharply.  Underlying fat subcutaneous tissues dissected with Bovie electrocautery down to fascia.  Fascia was split up from the trochanteric starting point sharply in line with fibers, and the underlying muscle was divided bluntly in line with the fibers.  The starting wire was positioned on the trochanteric starting point, and advanced centrally down the femoral canal into position under fluoroscopic guidance.  Intramedullary position of guidewire was confirmed on orthogonal AP and lateral fluoroscopic views.  The proximal femur was cannulated the trochanteric starting point with the entry reamer.  Starting wire and entry reamer were withdrawn, and an intermediate length 10 mm nail was impacted into position, with appropriate intramedullary placement confirmed on orthogonal AP and lateral fluoroscopic views.  Position for the head screw and distal interlock screw was noted on the skin, and skin, underlying subcutaneous fat, and IT band were sharply divided, exposing the lateral cortex of the femur.  The triple sleeve trocar for the head screw was advanced and positioned on the lateral femoral cortex.  Guidewire for the head screw was advanced into position along the calcar, and a center position AP, avoiding subchondral penetration.  Measurement was obtained of the wire, and the path for the head screw was drilled.  Screw was placed over the guidewire.  Guidewire and is recently withdrawn, the triple sleeve trocar for the distal interlock screw was placed through the same incision advanced onto the lateral femoral cortex.  Distal interlock screw was drilled and placed the nail.  The jig and assembly were removed, and final fluoroscopic images were obtained.  These demonstrated maintenance the fracture reduction with appropriate placement of the cephalomedullary nail maintaining a reduction.  Wounds were copiously irrigated and hemostasis was obtained.  The deep fascial layers were closed with  interrupted figure-of-eight #1 PDS, followed by simple inverted interrupted 0 Vicryl in the deep fat layer, followed by simple inverted interrupted 2-0 Monocryl in the deep dermal layer, followed by running 3-0 Monocryl subcuticular.  Skin was sealed with Dermabond and wounds were dressed with Aquacel dressings.  Patient was awakened from anesthesia and transferred to PACU in stable condition.  He tolerated procedure well.  There were no complications.  POST OPERATIVE INSTRUCTIONS: Mobility: Out of bed with PT/OT Pain control: Continue to wean/titrate to appropriate oral regimen DVT Prophylaxis: Lovenox 30 mg twice daily x6 weeks Further surgical plans: None RUE: Weightbearing as tolerated, limited abduction and forward flexion to 90 degrees due to acromial fracture LUE: Weightbearing as tolerated, no restrictions RLE: Weightbearing as tolerated, no restrictions LLE: Weightbearing as tolerated, no restrictions Disposition: Per primary team, as medically appropriate Dressing care: Keep AQUACEL on and dry for up to 14 days.  Do not allow surgical area to get wet before that.  Remove AQUACEL dressing after 14 days and allow area to get wet in shower but DO NOT SUBMERGE until wound is evaluated in clinic.  In most cases skin glue is used and no additional dressing is necessary.  Follow-up: Please call [facility] [phone]) to schedule follow-op appointment for 2 weeks after surgery.  TOURNIQUET TIME: N/A  BLOOD LOSS: 200 mL         DRAINS: none         SPECIMEN: none       COMPLICATIONS:  * No complications entered in OR log *         DISPOSITION: PACU - hemodynamically stable.         CONDITION: stable   VirgilinaD.  Orthopaedic Surgery [facility]   Portions of the record have been created with voice recognition software.  Grammatical and punctuation errors, random word insertions, wrong-word or "sound-a-like"  substitutions, pronoun errors (inaccuracies and/or substitutions), and/or incomplete sentences may have occurred due to the inherent limitations of voice recognition software.  Not all errors are caught or corrected.  Although every attempt is made to root out erroneous and incomplete transcription, the note may still not fully represent the intent or opinion of the author.  Read the chart carefully and recognize, using context, where errors/substitutions have occurred.  Any questions or concerns about the content of this note or information contained within the body of this dictation should be addressed directly with the author for clarification.

## 2021-05-15 NOTE — Assessment & Plan Note (Addendum)
Patient underwent ORIF on 3/2.  Pain seems to be reasonably well controlled.  Osteopenia suggested on imaging studies.  Vitamin D level is 51.15.   ?Seen by physical therapy.  Inpatient rehabilitation was recommended.  Waiting on bed availability.  Medically stable for discharge. ?On Lovenox for DVT prophylaxis per orthopedics.  Needs to be on it for 6 weeks. ?

## 2021-05-15 NOTE — H&P (Signed)
PREOPERATIVE H&P  HPI: Jack Neal is a 76 y.o. male who has presented today for surgery, with the diagnosis of right intertrochanteric hip fracture and right posterior acromial process fracture.  The various methods of treatment have been discussed with the patient and family.  After consideration of risks, benefits, and other options for treatment, the patient has consented to INTRAMEDULLARY (IM) NAIL INTERTROCHANTRIC as a surgical intervention.  The patient's history has been reviewed, patient examined, no change in status, stable for surgery.  I have reviewed the patient's chart and labs.  Questions were answered to the patient's satisfaction.    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   Current Outpatient Medications on File Prior to Encounter  Medication Sig Dispense Refill   acetaminophen (TYLENOL) 500 MG tablet Take 500 mg by mouth every 6 (six) hours as needed for moderate pain.     aspirin EC 81 MG tablet Take 1 tablet (81 mg total) by mouth daily. Swallow whole. 90 tablet 3   azithromycin (ZITHROMAX) 500 MG tablet Take 1 tablet (500mg ) one hour prior to all dental visits until 07/03/2021. 3 tablet 4   bumetanide (BUMEX) 2 MG tablet Take 4 mg by mouth daily as needed (edema).     buprenorphine-naloxone  (SUBOXONE) 2-0.5 mg SUBL SL tablet Place 1 tablet under the tongue daily.     calcium elemental as carbonate (TUMS ULTRA 1000) 400 MG chewable tablet Chew 1,000 mg by mouth daily as needed for heartburn.     camphor-menthol (SARNA) lotion Apply 1 application topically daily as needed for itching.     Carboxymethylcellulose Sodium (ARTIFICIAL TEARS OP) Place 1 drop into both eyes 3 (three) times daily as needed (dry eyes).     Cholecalciferol (VITAMIN D) 50 MCG (2000 UT) tablet Take 2,000 Units by mouth 2 (two) times daily.     clopidogrel (PLAVIX) 75 MG tablet Take 1 tablet (75 mg total) by mouth daily. 90 tablet 1   Cyanocobalamin (VITAMIN B-12 PO) Take 6 mcg by mouth in the morning and at bedtime.     diclofenac Sodium (VOLTAREN) 1 % GEL Apply 2 g topically daily. For feet pain     doxepin (SINEQUAN) 10 MG capsule Take 10 mg by mouth at bedtime.     DULoxetine (CYMBALTA) 30 MG capsule Take 30-60 mg by mouth See admin instructions. 60 mg in the morning  30 mg at bedtime     ferrous sulfate 325 (65 FE) MG tablet Take 325 mg by mouth every Monday, Wednesday, and Friday.     fluticasone (FLONASE) 50 MCG/ACT nasal spray Place 2 sprays into both nostrils daily as needed for allergies or rhinitis.     galantamine (RAZADYNE ER) 8 MG 24 hr capsule Take 16 mg by mouth at bedtime.     hydrOXYzine (ATARAX/VISTARIL)  10 MG tablet Take 1 tablet (10 mg total) by mouth 3 (three) times daily as needed for anxiety (Severe jerking/tremors.). (Patient taking differently: Take 20 mg by mouth at bedtime.) 30 tablet 0   ketoconazole (NIZORAL) 2 % shampoo Apply 1 application topically every Monday, Wednesday, and Friday.     Krill Oil 1000 MG CAPS Take 1,000 mg by mouth daily.     lidocaine (LIDODERM) 5 % Place 1 patch onto the skin daily. feet     Lidocaine HCl 4 % CREA Apply 1 g topically daily as needed (pain). Icy Hot     loperamide (IMODIUM A-D) 2 MG tablet Take 2 mg by mouth 3 (three) times daily as needed for  diarrhea or loose stools.     MAGNESIUM PO Take 375 mg by mouth in the morning and at bedtime.     memantine (NAMENDA) 10 MG tablet Take 10-20 mg by mouth See admin instructions. 10 mg in the morning  20 mg at bedtime     Multiple Vitamin (MULTIVITAMIN) capsule Take 1 capsule by mouth daily.     NON FORMULARY CPAP at bedtime     Pancrelipase, Lip-Prot-Amyl, (CREON PO) Take 36,000 Units by mouth 3 (three) times daily with meals.     POTASSIUM PO Take 450 mg by mouth in the morning and at bedtime.     prazosin (MINIPRESS) 1 MG capsule Take 2 mg by mouth at bedtime.     Probiotic CHEW Chew 3 capsules by mouth daily.     Simethicone 180 MG CAPS Take 180 mg by mouth daily as needed (gas).     sodium chloride (OCEAN) 0.65 % SOLN nasal spray Place 1 spray into both nostrils at bedtime.     Sodium Fluoride 1.1 % PSTE Place 1 application onto teeth 2 (two) times daily.     VITAMIN A PO Take 4,000 Units by mouth daily.     vitamin C (ASCORBIC ACID) 250 MG tablet Take 250 mg by mouth 2 (two) times daily.     zinc sulfate 220 (50 Zn) MG capsule Take 220 mg by mouth daily.     pantoprazole (PROTONIX) 40 MG tablet Take 1 tablet (40 mg total) by mouth daily. (Patient not taking: Reported on 05/15/2021) 60 tablet 2   Allergies  Allergen Reactions   Amphetamine Rash and Other (See Comments)    Altered mental status- amnesia (also)   Diazepam Rash    Other reaction(s): Amnesia, Mental Status Changes (intolerance), Other amnesia    Zoloft [Sertraline] Anxiety, Rash and Other (See Comments)    Panic attacks and Cardiovascular Arrest (ALSO)    Gabapentin     Tremors/jerking, hallucinations, dry mouth   Gluten Meal Diarrhea   Penicillin G Rash     PSH: Past Surgical History:  Procedure Laterality Date   LEFT ATRIAL APPENDAGE OCCLUSION N/A 01/02/2021   Procedure: LEFT ATRIAL APPENDAGE OCCLUSION;  Surgeon: Lanier Prude, MD;  Location: [facility] INVASIVE CV LAB;  Service: Cardiovascular;  Laterality: N/A;    TEE WITHOUT CARDIOVERSION N/A 01/02/2021   Procedure: TRANSESOPHAGEAL ECHOCARDIOGRAM (TEE);  Surgeon: Lanier Prude, MD;  Location: [facility] INVASIVE CV LAB;  Service: Cardiovascular;  Laterality: N/A;   TEE WITHOUT CARDIOVERSION N/A 02/17/2021   Procedure: TRANSESOPHAGEAL ECHOCARDIOGRAM (TEE);  Surgeon: Sande Rives, MD;  Location: [facility] ENDOSCOPY;  Service: Cardiovascular;  Laterality: N/A;     Family History Social History  Family History  Problem Relation Age of Onset   Heart attack Mother  Ulcers Father    Brain cancer Sister    Heart disease Neg Hx     Social History   Socioeconomic History   Marital status: Married    Spouse name: Not on file   Number of children: 3   Years of education: Not on file   Highest education level: Not on file  Occupational History   Occupation: Retired  Tobacco Use   Smoking status: Former    Types: Cigarettes   Smokeless tobacco: Never  Substance and Sexual Activity   Alcohol use: Not Currently   Drug use: Never   Sexual activity: Yes  Other Topics Concern   Not on file  Social History Narrative   Not on file   Social Determinants of Health   Financial Resource Strain: Not on file  Food Insecurity: Not on file  Transportation Needs: Not on file  Physical Activity: Not on file  Stress: Not on file  Social Connections: Not on file     Review of Systems: MSK: As noted per HPI above.  Peripheral neuropathy affecting bilateral lower extremities at baseline. GI: No current Nausea/vomiting ENT: Denies sore throat, epistaxis CV: Denies chest pain Resp: No current shortness of breath  Other than mentioned above, there are no Constitutional, Neurological, Psychiatric, ENT, Ophthalmological, Cardiovascular, Respiratory, GI, GU, Musculoskeletal, Integumentary, Lymphatic, Endocrine or Allergic issues.   Physical Examination: CV: Normal distal pulses Lungs: Unlabored respirations RUE: Ecchymosis over posterior lateral corner of  acromion.  Tender to palpation in this region the site of the fracture.  No pain with shoulder range of motion.  Normal EDC, FDP index and small finger, APB, and dorsal interossei function.  Sensation tact light touch in the median, radial, and ulnar distributions.  Warm well-perfused distally. LLE: Severe hip pain with any motion of the lower extremity.  Tender palpation over the fracture site.  Normal dorsiflexion, plantarflexion, and EHL function.  Sensation tact light touch in the superficial peroneal, deep peroneal, and tibial distributions.  Warm and well-perfused distally.  Assessment/Plan: INTRAMEDULLARY (IM) NAIL INTERTROCHANTRIC    Ernestina Columbia M.D. Orthopaedic Surgery [facility]  Review of this patient's medications prescribed by other providers does not in any way constitute an endorsement by this clinician of their use, indications, dosage, route, efficacy, interactions, or other clinical parameters.  Portions of the record have been created with voice recognition software.  Grammatical and punctuation errors, random word insertions, wrong-word or "sound-a-like" substitutions, pronoun errors (inaccuracies and/or substitutions), and/or incomplete sentences may have occurred due to the inherent limitations of voice recognition software.  Not all errors are caught or corrected.  Although every attempt is made to root out erroneous and incomplete transcription, the note may still not fully represent the intent or opinion of the author.  Read the chart carefully and recognize, using context, where errors/substitutions have occurred.  Any questions or concerns about the content of this note or information contained within the body of this dictation should be addressed directly with the author for clarification.

## 2021-05-15 NOTE — Assessment & Plan Note (Addendum)
Moderate stool burden noted on CT scan.  Started on laxatives and stool softeners.  Has had multiple bowel movements.  We will cut back on the dose of his laxatives. ?

## 2021-05-15 NOTE — Hospital Course (Addendum)
76 y.o. Caucasian male with medical history significant for atrial fibrillation, depression, GERD, PTSD, peripheral neuropathy, obstructive sleep apnea on CPAP, status post pacemaker, status post Watchman device, who presented to the ER after sustaining a mechanical fall.  He fell on his right side.  Came in with the right hip and shoulder pain.  Found to have right hip fracture along with a fracture of the right acromion.  He was hospitalized for further management.  Patient underwent surgery on 3/2.  Had drop in hemoglobin requiring blood transfusion.  Transfused another unit of PRBC on 3/4.  Hemoglobin stable.  Now waiting on CIR. ?

## 2021-05-15 NOTE — ED Notes (Signed)
Pt wanting more pain med nothing else can be given for 4 more hours ?

## 2021-05-15 NOTE — Assessment & Plan Note (Addendum)
Seen by orthopedics.This is being treated conservatively.  Weightbearing as tolerated.  Outpatient follow-up with orthopedics. ?

## 2021-05-15 NOTE — Anesthesia Preprocedure Evaluation (Addendum)
Anesthesia Evaluation  ? ?Patient awake ? ? ? ?Reviewed: ?Allergy & Precautions, NPO status , Patient's Chart, lab work & pertinent test results ? ?Airway ?Mallampati: II ? ?TM Distance: >3 FB ?Neck ROM: Full ? ? ? Dental ?no notable dental hx. ?(+) Teeth Intact, Dental Advisory Given ?  ?Pulmonary ?sleep apnea and Continuous Positive Airway Pressure Ventilation , former smoker,  ?  ?Pulmonary exam normal ?breath sounds clear to auscultation ? ? ? ? ? ? Cardiovascular ?Exercise Tolerance: Good ?Normal cardiovascular examAtrial Fibrillation + pacemaker  ?Rhythm:Regular Rate:Normal ? ?Vpaced ? ?12/5/222 TEE ??1. 20 mm Watchman FLX is present in the LAA. Max diameter 18 mm (10%  ?compression). There is a small leak 2-3 mm in the inferior aspect of the  ?device. There is also considerable shoulder in this region of the device.  ?Otherwise, the device is well-seated.  ??2. Left ventricular ejection fraction, by estimation, is 60 to 65%. The  ?left ventricle has normal function.  ??3. A device lead is seen in the RA/RV. There appears to be a small mobile  ?mass attached to the lead. This either represents a small thrombus or is  ?the eustachian valve coming in and out of plane. Right ventricular  ?systolic function is mildly reduced.  ?The right ventricular size is severely enlarged.  ??4. 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 3D VCA  ?0.38 cm2. The septal leaflet appears impinged by the pacer lead. The  ?tricuspid valve is abnormal. Tricuspid valve regurgitation is moderate to  ?severe.  ??5. Left atrial size was severely dilated. No left atrial/left atrial  ?appendage thrombus was detected.  ??6. Right atrial size was severely dilated.  ??7. The mitral valve is grossly normal. Mild to moderate mitral valve  ?regurgitation. No evidence of mitral stenosis.  ??8. The aortic valve is tricuspid. Aortic valve regurgitation is trivial.  ?No aortic stenosis is present.   ??9. There is mild (Grade II) layered plaque involving the descending  ?aorta.  ?10. Evidence of atrial level shunting detected by color flow Doppler.  ?There is a small patent foramen ovale with predominantly left to right  ?shunting across the atrial septum.  ?  ?Neuro/Psych ?  ? GI/Hepatic ?Neg liver ROS, GERD  ,  ?Endo/Other  ?negative endocrine ROS ? Renal/GU ?negative Renal ROS  ? ?  ?Musculoskeletal ?negative musculoskeletal ROS ?(+)  ? Abdominal ?  ?Peds ? Hematology ? ?(+) Blood dyscrasia, anemia , Lab Results ?     Component                Value               Date                 ?     WBC                      6.2                 05/15/2021           ?     HGB                      10.1 (L)            05/15/2021           ?     HCT  32.3 (L)            05/15/2021           ?     MCV                      100.0               05/15/2021           ?     PLT                      110 (L)             05/15/2021           ?   ?Anesthesia Other Findings ?All: Diazepam  ? Reproductive/Obstetrics ? ?  ? ? ? ? ? ? ? ? ? ? ? ? ? ?  ?  ? ? ? ? ? ? ? ?Anesthesia Physical ?Anesthesia Plan ? ?ASA: 3 and emergent ? ?Anesthesia Plan: General  ? ?Post-op Pain Management:   ? ?Induction: Intravenous ? ?PONV Risk Score and Plan: 3 and Treatment may vary due to age or medical condition and Ondansetron ? ?Airway Management Planned: Oral ETT ? ?Additional Equipment: None ? ?Intra-op Plan:  ? ?Post-operative Plan: Extubation in OR ? ?Informed Consent: I have reviewed the patients History and Physical, chart, labs and discussed the procedure including the risks, benefits and alternatives for the proposed anesthesia with the patient or authorized representative who has indicated his/her understanding and acceptance.  ? ? ? ?Dental advisory given ? ?Plan Discussed with: CRNA and Anesthesiologist ? ?Anesthesia Plan Comments: (Pt on Plavix will have a GA)  ? ? ? ? ? ?Anesthesia Quick Evaluation ? ?

## 2021-05-15 NOTE — Assessment & Plan Note (Addendum)
Acute blood loss anemia ? ?Drop in hemoglobin most likely due to surgery and fracture.  He required 2 units of PRBC.  Hemoglobin stable for the last 72 hours.   ?Anemia panel was reviewed.  No clear-cut deficiencies identified. ?

## 2021-05-15 NOTE — Progress Notes (Signed)
[facility] PROGRESS NOTE   Mohamad Westgate [id number] DOB: Mar 22, 1945 DOA: 05/14/2021  0 DOS: the patient was seen and examined on 05/15/2021  PCP: [facility]  Brief History and Hospital Course:  76 y.o. Caucasian male with medical history significant for atrial fibrillation, depression, GERD, PTSD, peripheral neuropathy, obstructive sleep apnea on CPAP, status post pacemaker, status post Watchman device, who presented to the ER after sustaining a mechanical fall.  He fell on his right side.  Came in with the right hip and shoulder pain.  Found to have right hip fracture along with a fracture of the right acromion.  He was hospitalized for further management.    Consultants: Orthopedics.  Trauma surgery.  Procedures: None yet    Subjective: Patient complains of 5 out of 10 pain in his right hip area.  Denies any chest pain or shortness of breath.  No nausea vomiting.    Assessment/Plan:   * Closed right hip fracture ([place])- (present on admission) Orthopedics has been consulted.  We will await their input.  Pain control.  Holding aspirin and Plavix.  Patient denies any chest pain or shortness of breath currently.  See H&P for preop assessment. Osteopenia suggested on imaging studies.  Check vitamin B12 level.  Closed fracture of acromion Management per orthopedics.  Permanent atrial fibrillation ([place])- (present on admission) He is status post PPM and Watchman left atrial appendage occlusive device.  Watchman device was placed on October 2022.  He was on Eliquis and aspirin for 45 days following which he has been on aspirin and Plavix.  To continue until April of this year.  We will reach out to cardiology to see if it is okay to hold these medications for his upcoming surgery.  Dementia without behavioral disturbance ([place]) We will continue [place] ER and Namenda..  Stable.  Opioid dependence ([place]) The patient is on Suboxone chronically.  Since he is requiring  IV narcotics Suboxone is currently on hold.  OSA on CPAP We will continue CPAP nightly.  Pancreatic insufficiency We will continue pancrelipase.  Macrocytic anemia- (present on admission) Hemoglobin seems to be close to baseline.  We will check anemia panel in the morning     DVT Prophylaxis: To be determined postoperatively. Code Status: DNR Family Communication: Discussed with patient Disposition Plan: To be determined  Status is: Inpatient Remains inpatient appropriate because: Right hip fracture      Medications: Scheduled:  vitamin C  250 mg Oral BID   buprenorphine-naloxone  1 tablet Sublingual Daily   cholecalciferol  2,000 Units Oral BID   doxepin  10 mg Oral QHS   DULoxetine  30-60 mg Oral See admin instructions   [START ON 05/16/2021] ferrous sulfate  325 mg Oral Q M,W,F   folic acid  1 mg Oral Daily   galantamine  16 mg Oral QHS   hydrOXYzine  20 mg Oral QHS   lipase/protease/amylase  36,000 Units Oral TID WC   memantine  10-20 mg Oral See admin instructions   multivitamin with minerals  1 tablet Oral Daily   prazosin  2 mg Oral QHS   Probiotic  3 capsule Oral Daily   sodium chloride  1 spray Each Nare QHS   vitamin B-12  50 mcg Oral Daily   zinc sulfate  220 mg Oral Daily   Continuous:  sodium chloride 125 mL/hr at 05/15/21 0849   methocarbamol (ROBAXIN) IV     [id number], calcium carbonate, fluticasone, HYDROcodone-acetaminophen, loperamide, magnesium hydroxide, methocarbamol **OR**  methocarbamol (ROBAXIN) IV, morphine injection, morphine injection, simethicone  Antibiotics: Anti-infectives (From admission, onward)    None       Objective:  Vital Signs  Vitals:   05/15/21 0330 05/15/21 0709 05/15/21 0745 05/15/21 0830  BP: 108/62 108/68  103/67  Pulse: 69 80 80 78  Resp: 10 12 14    Temp:      TempSrc:      SpO2: 97% 97% 94% 97%  Weight:      Height:        Intake/Output Summary (Last 24 hours) at 05/15/2021 0909 Last data filed  at 05/15/2021 0115 Gross per 24 hour  Intake 315 ml  Output 400 ml  Net -85 ml   Filed Weights   05/14/21 2339  Weight: 77.1 kg    General appearance: Awake alert.  In no distress Resp: Clear to auscultation bilaterally.  Normal effort Cardio: S1-S2 is normal regular.  No S3-S4.  No rubs murmurs or bruit GI: Abdomen is soft.  Nontender nondistended.  Bowel sounds are present normal.  No masses organomegaly Extremities: No edema. Neurologic:   No focal neurological deficits.    Lab Results:  Data Reviewed: I have personally reviewed labs and imaging study reports  CBC: Recent Labs  Lab 05/14/21 2239  WBC 5.2  HGB 11.8*  HCT 37.0*  MCV 103.1*  PLT 124*    Basic Metabolic Panel: Recent Labs  Lab 05/14/21 2239  NA 139  K 4.2  CL 103  CO2 29  GLUCOSE 98  BUN 13  CREATININE 0.82  CALCIUM 8.5*    GFR: Estimated Creatinine Clearance: 84.2 mL/min (by C-G formula based on SCr of 0.82 mg/dL).  Liver Function Tests: Recent Labs  Lab 05/14/21 2239  AST 20  ALT 12  ALKPHOS 143*  BILITOT 1.2  PROT 6.0*  ALBUMIN 3.4*     Coagulation Profile: Recent Labs  Lab 05/14/21 2239  INR 1.2     Recent Results (from the past 240 hour(s))  Resp Panel by RT-PCR (Flu A&B, Covid) Nasopharyngeal Swab     Status: None   Collection Time: 05/14/21 11:39 PM   Specimen: Nasopharyngeal Swab; Nasopharyngeal(NP) swabs in vial transport medium  Result Value Ref Range Status   SARS Coronavirus 2 by RT PCR NEGATIVE NEGATIVE Final    Comment: (NOTE) SARS-CoV-2 target nucleic acids are NOT DETECTED.  The SARS-CoV-2 RNA is generally detectable in upper respiratory specimens during the acute phase of infection. The lowest concentration of SARS-CoV-2 viral copies this assay can detect is 138 copies/mL. A negative result does not preclude SARS-Cov-2 infection and should not be used as the sole basis for treatment or other patient management decisions. A negative result may occur  with  improper specimen collection/handling, submission of specimen other than nasopharyngeal swab, presence of viral mutation(s) within the areas targeted by this assay, and inadequate number of viral copies(<138 copies/mL). A negative result must be combined with clinical observations, patient history, and epidemiological information. The expected result is Negative.  Fact Sheet for Patients:  EntrepreneurPulse.com.au  Fact Sheet for Healthcare Providers:  IncredibleEmployment.be  This test is no t yet approved or cleared by the [place] FDA and  has been authorized for detection and/or diagnosis of SARS-CoV-2 by FDA under an Emergency Use Authorization (EUA). This EUA will remain  in effect (meaning this test can be used) for the duration of the COVID-19 declaration under Section 564(b)(1) of the Act, 21 U.S.C.section 360bbb-3(b)(1), unless the authorization is terminated  or  revoked sooner.       Influenza A by PCR NEGATIVE NEGATIVE Final   Influenza B by PCR NEGATIVE NEGATIVE Final    Comment: (NOTE) The Xpert Xpress SARS-CoV-2/FLU/RSV plus assay is intended as an aid in the diagnosis of influenza from Nasopharyngeal swab specimens and should not be used as a sole basis for treatment. Nasal washings and aspirates are unacceptable for Xpert Xpress SARS-CoV-2/FLU/RSV testing.  Fact Sheet for Patients: EntrepreneurPulse.com.au  Fact Sheet for Healthcare Providers: IncredibleEmployment.be  This test is not yet approved or cleared by the [place] FDA and has been authorized for detection and/or diagnosis of SARS-CoV-2 by FDA under an Emergency Use Authorization (EUA). This EUA will remain in effect (meaning this test can be used) for the duration of the COVID-19 declaration under Section 564(b)(1) of the Act, 21 U.S.C. section 360bbb-3(b)(1), unless the authorization is terminated  or revoked.  Performed at [facility], [place] [address], [place], [place] [address]       Radiology Studies: DG Shoulder Right  Result Date: 05/14/2021 CLINICAL DATA:  fall EXAM: RIGHT SHOULDER - 2+ VIEW COMPARISON:  Chest x-ray 01/02/2021 FINDINGS: Acute minimally displaced fracture of the acromion. No other acute fracture identified. No shoulder dislocation. There is no evidence of arthropathy or other focal bone abnormality. Soft tissues are unremarkable. IMPRESSION: Acute minimally displaced fracture of the acromion. Electronically Signed   By: Iven Finn M.D.   On: 05/14/2021 23:23   CT HEAD WO CONTRAST (5MM)  Result Date: 05/14/2021 CLINICAL DATA:  Polytrauma, blunt; Head trauma, minor (Age >= 65y) EXAM: CT HEAD WITHOUT CONTRAST CT CERVICAL SPINE WITHOUT CONTRAST TECHNIQUE: Multidetector CT imaging of the head and cervical spine was performed following the standard protocol without intravenous contrast. Multiplanar CT image reconstructions of the cervical spine were also generated. RADIATION DOSE REDUCTION: This exam was performed according to the departmental dose-optimization program which includes automated exposure control, adjustment of the mA and/or kV according to patient size and/or use of iterative reconstruction technique. COMPARISON:  None. FINDINGS: CT HEAD FINDINGS BRAIN: BRAIN Cerebral ventricle sizes are concordant with the degree of cerebral volume loss. Patchy and confluent areas of decreased attenuation are noted throughout the deep and periventricular white matter of the cerebral hemispheres bilaterally, compatible with chronic microvascular ischemic disease. No evidence of large-territorial acute infarction. No parenchymal hemorrhage. No mass lesion. No extra-axial collection. No mass effect or midline shift. No hydrocephalus. Basilar cisterns are patent. Vascular: No hyperdense vessel. Atherosclerotic calcifications are present within the cavernous internal carotid  arteries. Skull: No acute fracture or focal lesion. Sinuses/Orbits: Paranasal sinuses and mastoid air cells are clear. Bilateral lens replacement. Otherwise the orbits are unremarkable. Other: None. CT CERVICAL SPINE FINDINGS Alignment: Grade anterolisthesis C2 on C3. Skull base and vertebrae: Diffusely decreased bone density. Multilevel severe degenerative changes of the spine with C4-C5 vertebral body fusion. No acute fracture. No aggressive appearing focal osseous lesion or focal pathologic process. Soft tissues and spinal canal: No prevertebral fluid or swelling. No visible canal hematoma. Upper chest: Unremarkable. Other: None. IMPRESSION: 1. No acute intracranial abnormality. 2. No acute displaced fracture or traumatic listhesis of the cervical spine. 3. Diffusely decreased bone density. Electronically Signed   By: Iven Finn M.D.   On: 05/14/2021 23:32   CT Cervical Spine Wo Contrast  Result Date: 05/14/2021 CLINICAL DATA:  Polytrauma, blunt; Head trauma, minor (Age >= 65y) EXAM: CT HEAD WITHOUT CONTRAST CT CERVICAL SPINE WITHOUT CONTRAST TECHNIQUE: Multidetector CT imaging of the head  and cervical spine was performed following the standard protocol without intravenous contrast. Multiplanar CT image reconstructions of the cervical spine were also generated. RADIATION DOSE REDUCTION: This exam was performed according to the departmental dose-optimization program which includes automated exposure control, adjustment of the mA and/or kV according to patient size and/or use of iterative reconstruction technique. COMPARISON:  None. FINDINGS: CT HEAD FINDINGS BRAIN: BRAIN Cerebral ventricle sizes are concordant with the degree of cerebral volume loss. Patchy and confluent areas of decreased attenuation are noted throughout the deep and periventricular white matter of the cerebral hemispheres bilaterally, compatible with chronic microvascular ischemic disease. No evidence of large-territorial acute  infarction. No parenchymal hemorrhage. No mass lesion. No extra-axial collection. No mass effect or midline shift. No hydrocephalus. Basilar cisterns are patent. Vascular: No hyperdense vessel. Atherosclerotic calcifications are present within the cavernous internal carotid arteries. Skull: No acute fracture or focal lesion. Sinuses/Orbits: Paranasal sinuses and mastoid air cells are clear. Bilateral lens replacement. Otherwise the orbits are unremarkable. Other: None. CT CERVICAL SPINE FINDINGS Alignment: Grade anterolisthesis C2 on C3. Skull base and vertebrae: Diffusely decreased bone density. Multilevel severe degenerative changes of the spine with C4-C5 vertebral body fusion. No acute fracture. No aggressive appearing focal osseous lesion or focal pathologic process. Soft tissues and spinal canal: No prevertebral fluid or swelling. No visible canal hematoma. Upper chest: Unremarkable. Other: None. IMPRESSION: 1. No acute intracranial abnormality. 2. No acute displaced fracture or traumatic listhesis of the cervical spine. 3. Diffusely decreased bone density. Electronically Signed   By: Tish Frederickson M.D.   On: 05/14/2021 23:32   DG Pelvis Portable  Result Date: 05/14/2021 CLINICAL DATA:  trauma EXAM: PORTABLE PELVIS 1-2 VIEWS COMPARISON:  None. FINDINGS: Acute comminuted displaced intertrochanteric fracture of the right femur. Cortical irregularity versus overlying bowel of the right iliac bone. Query slight asymmetry of the right sacroiliac joint compared to the left. Partially visualized left hip grossly unremarkable on frontal view. No right hip dislocation. No pelvic bone lesions are seen. IMPRESSION: 1. Acute comminuted displaced intertrochanteric fracture of the right femur. 2. Cortical irregularity versus overlying bowel of the right iliac bone. 3. Query slight asymmetry of the right sacroiliac joint compared to the left. Electronically Signed   By: Tish Frederickson M.D.   On: 05/14/2021 23:18   CT  CHEST ABDOMEN PELVIS W CONTRAST  Result Date: 05/15/2021 CLINICAL DATA:  Blunt poly trauma, fall EXAM: CT CHEST, ABDOMEN, AND PELVIS WITH CONTRAST TECHNIQUE: Multidetector CT imaging of the chest, abdomen and pelvis was performed following the standard protocol during bolus administration of intravenous contrast. RADIATION DOSE REDUCTION: This exam was performed according to the departmental dose-optimization program which includes automated exposure control, adjustment of the mA and/or kV according to patient size and/or use of iterative reconstruction technique. CONTRAST:  OMNIPAQUE IOHEXOL 300 MG/ML  SOLN COMPARISON:  None. FINDINGS: CT CHEST FINDINGS Cardiovascular: Extensive multi-vessel coronary artery calcification. Mild global cardiomegaly. Left subclavian dual lead pacemaker is in place with leads within the right atrium and right ventricle toward the apex. No pericardial effusion. Central pulmonary arteries are of normal caliber. The thoracic aorta is of normal caliber. Mild atherosclerotic calcification. Mediastinum/Nodes: No pathologic thoracic adenopathy. Esophagus unremarkable. No pneumomediastinum. No mediastinal hematoma. Lungs/Pleura: Scattered areas of parenchymal scarring are noted within the left upper lung zone and right lung base. No confluent pulmonary infiltrate. No pneumothorax or pleural effusion. Central airways are widely patent. Musculoskeletal: Osseous structures are diffusely osteopenic. Acute minimally displaced right acromial fracture noted. Healed  right clavicle fracture noted. 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 Status post cholecystectomy. No biliary dilatation. Pancreas: Unremarkable Spleen: Unremarkable Adrenals/Urinary Tract: Adrenal glands are unremarkable. 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 Congenital absence of  the left kidney with compensatory hypertrophy of the right kidney. No hydronephrosis. No enhancing cortical mass. No intrarenal or ureteral calculi. The bladder is unremarkable. Stomach/Bowel: Surgical changes of Roux-en-Y gastric bypass are identified. Moderate stool throughout the colon without evidence of obstruction. The stomach, small bowel, and large bowel are otherwise unremarkable without evidence of obstruction or focal inflammation. Appendix normal. No free intraperitoneal gas or fluid. Vascular/Lymphatic: Aortic atherosclerosis. No enlarged abdominal or pelvic lymph nodes. Reproductive: Prostate is unremarkable. Other: No abdominal wall hernia. Musculoskeletal: Comminuted, overriding intratrochanteric fracture of the right hip with varus angulation noted. Muscular thickening in intramuscular infiltration in keeping with intramuscular and interstitial hemorrhage surrounding the fracture. Subcutaneous infiltration within the posterolateral soft tissues of the proximal right thigh is in keeping with a subcutaneous hematoma at this level. The osseous structures are diffusely osteopenic. Grade 1 anterolisthesis L5-S1. Degenerative changes are noted within the hips bilaterally. A 3.1 cm lytic lesion is seen within the left femoral neck demonstrating a small sinus tract anteriorly, unchanged from prior examination of 05/31/2019. No associated pathologic fracture. IMPRESSION: Acute minimally displaced fracture of the right acromion. Acute comminuted angulated intratrochanteric fracture of the right hip with moderate surrounding intramuscular and interstitial hemorrhage. Extensive multi-vessel coronary artery calcification. Mild global cardiomegaly. 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 No obstruction. Moderate stool throughout the colon without evidence of obstruction. Status post Roux-en-Y gastric bypass. Marked diffuse osteopenia. 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 No associated pathologic fracture. These results were called by telephone at the time of interpretation on 05/15/2021 at 12:00 am to provider Reather Laurence , who verbally acknowledged these results. Electronically Signed   By: Fidela Salisbury M.D.   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 Electronically Signed   By: Iven Finn M.D.   On: 05/14/2021 23:27   Result Date: 05/14/2021 CLINICAL DATA:  trauma EXAM: PORTABLE CHEST 1 VIEW.  Patient is rotated. COMPARISON:  CT heart 11/11/2020 FINDINGS: Left chest wall 2 lead cardiac pacemaker in grossly appropriate position. Cardiomegaly. The heart and mediastinal contours are unchanged. Query Amplatzer device overlying the heart. Slightly elevated left hemidiaphragm. Left base atelectasis. No focal consolidation. No pulmonary edema. No pleural effusion. No pneumothorax. No acute osseous abnormality. IMPRESSION: No active disease. Electronically Signed: By: Iven Finn M.D. On: 05/14/2021 23:14   DG FEMUR, MIN 2 VIEWS RIGHT  Result Date: 05/15/2021 CLINICAL DATA:  Fracture. EXAM: RIGHT FEMUR 2 VIEWS COMPARISON:  None. FINDINGS: There is a minimally displaced comminuted intertrochanteric fracture of the proximal right femur. The remaining bony structures are intact. No dislocation. Mild degenerative changes are present at the knee. The soft tissues are unremarkable. IMPRESSION: Minimally displaced comminuted intertrochanteric fracture of the right hip. Electronically Signed   By: Brett Fairy M.D.   On: 05/15/2021 01:22       LOS: 0 days   Karcyn Menn  [place]  [facility] [employer], [email or web address]  05/15/2021, 9:09 AM

## 2021-05-15 NOTE — Assessment & Plan Note (Addendum)
Continue CPAP nightly. °

## 2021-05-15 NOTE — Transfer of Care (Signed)
Immediate Anesthesia Transfer of Care Note ? ?Patient: Jack Neal ? ?Procedure(s) Performed: INTRAMEDULLARY (IM) NAIL INTERTROCHANTRIC (Right) ? ?Patient Location: PACU ? ?Anesthesia Type:General ? ?Level of Consciousness: drowsy ? ?Airway & Oxygen Therapy: Patient Spontanous Breathing and Patient connected to face mask oxygen ? ?Post-op Assessment: Report given to RN and Post -op Vital signs reviewed and stable ? ?Post vital signs: Reviewed and stable ? ?Last Vitals:  ?Vitals Value Taken Time  ?BP 90/61 05/15/21 1650  ?Temp    ?Pulse 80   ?Resp 11 05/15/21 1653  ?SpO2 100   ?Vitals shown include unvalidated device data. ? ?Last Pain:  ?Vitals:  ? 05/15/21 1253  ?TempSrc:   ?PainSc: 7   ?   ? ?  ? ?Complications: No notable events documented. ?

## 2021-05-15 NOTE — Plan of Care (Signed)
  Problem: Coping: Goal: Level of anxiety will decrease Outcome: Progressing   Problem: Elimination: Goal: Will not experience complications related to bowel motility Outcome: Progressing   Problem: Pain Managment: Goal: General experience of comfort will improve Outcome: Progressing   Problem: Safety: Goal: Ability to remain free from injury will improve Outcome: Progressing   

## 2021-05-15 NOTE — Anesthesia Procedure Notes (Signed)
Procedure Name: Intubation ?Date/Time: 05/15/2021 3:00 PM ?Performed by: Erick Colace, CRNA ?Pre-anesthesia Checklist: Patient identified, Emergency Drugs available, Suction available and Patient being monitored ?Patient Re-evaluated:Patient Re-evaluated prior to induction ?Oxygen Delivery Method: Circle system utilized ?Preoxygenation: Pre-oxygenation with 100% oxygen ?Induction Type: IV induction ?Ventilation: Mask ventilation without difficulty ?Laryngoscope Size: Mac and 4 ?Grade View: Grade I ?Tube type: Oral ?Tube size: 7.5 mm ?Number of attempts: 1 ?Airway Equipment and Method: Stylet and Oral airway ?Placement Confirmation: ETT inserted through vocal cords under direct vision, positive ETCO2 and breath sounds checked- equal and bilateral ?Secured at: 23 cm ?Tube secured with: Tape ?Dental Injury: Teeth and Oropharynx as per pre-operative assessment  ?Comments: Performed by EMT student. ? ? ? ? ?

## 2021-05-15 NOTE — Progress Notes (Signed)
Progress Note     Subjective: He has pain in his right hip, right shoulder, and due to his head wound. Otherwise no new pains. He denies vision changes but does have a mild headache. NPO at time of my evaluation awaiting ortho but no abdominal pain, nausea, emesis   Objective: Vital signs in last 24 hours: Temp:  [98.7 F (37.1 C)] 98.7 F (37.1 C) (03/01 2224) Pulse Rate:  [69-106] 80 (03/02 0709) Resp:  [10-31] 12 (03/02 0709) BP: (86-124)/(46-92) 108/68 (03/02 0709) SpO2:  [92 %-100 %] 97 % (03/02 0709) Weight:  [77.1 kg] 77.1 kg (03/01 2339)    Intake/Output from previous day: 03/01 0701 - 03/02 0700 In: 315 [Blood:315] Out: 400 [Urine:400] Intake/Output this shift: No intake/output data recorded.  PE: General: pleasant, WD, male who is laying in bed in NAD HEENT: head is normocephalic. Pupils equal and round. EOMs intact.  Ears and nose without any masses or lesions.  Mouth is pink and moist. Small abrasion to posterior scalp with dried blood. No active bleeding Heart: regular, rate, and rhythm.  Normal s1,s2. No obvious murmurs, gallops, or rubs noted.  Palpable radial and pedal pulses bilaterally Lungs: CTAB, no wheezes, rhonchi, or rales noted.  Respiratory effort nonlabored Abd: soft, NT, ND, +BS, no masses, hernias, or organomegaly MSK: all 4 extremities are symmetrical with no cyanosis, clubbing, or edema.  RUE: NVI. Shoulder ROM limited secondary to pain LLE: NVI. Hip ROM limited secondary to pain Skin: warm and dry Psych: A&Ox3 with an appropriate affect.    Lab Results:  Recent Labs    05/14/21 2239  WBC 5.2  HGB 11.8*  HCT 37.0*  PLT 124*   BMET Recent Labs    05/14/21 2239  NA 139  K 4.2  CL 103  CO2 29  GLUCOSE 98  BUN 13  CREATININE 0.82  CALCIUM 8.5*   PT/INR Recent Labs    05/14/21 2239  LABPROT 15.2  INR 1.2   CMP     Component Value Date/Time   NA 139 05/14/2021 2239   NA 142 02/10/2021 1335   K 4.2 05/14/2021 2239    CL 103 05/14/2021 2239   CO2 29 05/14/2021 2239   GLUCOSE 98 05/14/2021 2239   BUN 13 05/14/2021 2239   BUN 17 02/10/2021 1335   CREATININE 0.82 05/14/2021 2239   CALCIUM 8.5 (L) 05/14/2021 2239   PROT 6.0 (L) 05/14/2021 2239   ALBUMIN 3.4 (L) 05/14/2021 2239   AST 20 05/14/2021 2239   ALT 12 05/14/2021 2239   ALKPHOS 143 (H) 05/14/2021 2239   BILITOT 1.2 05/14/2021 2239   GFRNONAA >60 05/14/2021 2239   Lipase  No results found for: LIPASE     Studies/Results: DG Shoulder Right  Result Date: 05/14/2021 CLINICAL DATA:  fall EXAM: RIGHT SHOULDER - 2+ VIEW COMPARISON:  Chest x-ray 01/02/2021 FINDINGS: Acute minimally displaced fracture of the acromion. No other acute fracture identified. No shoulder dislocation. There is no evidence of arthropathy or other focal bone abnormality. Soft tissues are unremarkable. IMPRESSION: Acute minimally displaced fracture of the acromion. Electronically Signed   By: Iven Finn M.D.   On: 05/14/2021 23:23   CT HEAD WO CONTRAST (5MM)  Result Date: 05/14/2021 CLINICAL DATA:  Polytrauma, blunt; Head trauma, minor (Age >= 65y) EXAM: CT HEAD WITHOUT CONTRAST CT CERVICAL SPINE WITHOUT CONTRAST TECHNIQUE: Multidetector CT imaging of the head and cervical spine was performed following the standard protocol without intravenous contrast. Multiplanar CT image reconstructions  of the cervical spine were also generated. RADIATION DOSE REDUCTION: This exam was performed according to the departmental dose-optimization program which includes automated exposure control, adjustment of the mA and/or kV according to patient size and/or use of iterative reconstruction technique. COMPARISON:  None. FINDINGS: CT HEAD FINDINGS BRAIN: BRAIN Cerebral ventricle sizes are concordant with the degree of cerebral volume loss. Patchy and confluent areas of decreased attenuation are noted throughout the deep and periventricular white matter of the cerebral hemispheres bilaterally,  compatible with chronic microvascular ischemic disease. No evidence of large-territorial acute infarction. No parenchymal hemorrhage. No mass lesion. No extra-axial collection. No mass effect or midline shift. No hydrocephalus. Basilar cisterns are patent. Vascular: No hyperdense vessel. Atherosclerotic calcifications are present within the cavernous internal carotid arteries. Skull: No acute fracture or focal lesion. Sinuses/Orbits: Paranasal sinuses and mastoid air cells are clear. Bilateral lens replacement. Otherwise the orbits are unremarkable. Other: None. CT CERVICAL SPINE FINDINGS Alignment: Grade anterolisthesis C2 on C3. Skull base and vertebrae: Diffusely decreased bone density. Multilevel severe degenerative changes of the spine with C4-C5 vertebral body fusion. No acute fracture. No aggressive appearing focal osseous lesion or focal pathologic process. Soft tissues and spinal canal: No prevertebral fluid or swelling. No visible canal hematoma. Upper chest: Unremarkable. Other: None. IMPRESSION: 1. No acute intracranial abnormality. 2. No acute displaced fracture or traumatic listhesis of the cervical spine. 3. Diffusely decreased bone density. Electronically Signed   By: Iven Finn M.D.   On: 05/14/2021 23:32   CT Cervical Spine Wo Contrast  Result Date: 05/14/2021 CLINICAL DATA:  Polytrauma, blunt; Head trauma, minor (Age >= 65y) EXAM: CT HEAD WITHOUT CONTRAST CT CERVICAL SPINE WITHOUT CONTRAST TECHNIQUE: Multidetector CT imaging of the head and cervical spine was performed following the standard protocol without intravenous contrast. Multiplanar CT image reconstructions of the cervical spine were also generated. RADIATION DOSE REDUCTION: This exam was performed according to the departmental dose-optimization program which includes automated exposure control, adjustment of the mA and/or kV according to patient size and/or use of iterative reconstruction technique. COMPARISON:  None. FINDINGS:  CT HEAD FINDINGS BRAIN: BRAIN Cerebral ventricle sizes are concordant with the degree of cerebral volume loss. Patchy and confluent areas of decreased attenuation are noted throughout the deep and periventricular white matter of the cerebral hemispheres bilaterally, compatible with chronic microvascular ischemic disease. No evidence of large-territorial acute infarction. No parenchymal hemorrhage. No mass lesion. No extra-axial collection. No mass effect or midline shift. No hydrocephalus. Basilar cisterns are patent. Vascular: No hyperdense vessel. Atherosclerotic calcifications are present within the cavernous internal carotid arteries. Skull: No acute fracture or focal lesion. Sinuses/Orbits: Paranasal sinuses and mastoid air cells are clear. Bilateral lens replacement. Otherwise the orbits are unremarkable. Other: None. CT CERVICAL SPINE FINDINGS Alignment: Grade anterolisthesis C2 on C3. Skull base and vertebrae: Diffusely decreased bone density. Multilevel severe degenerative changes of the spine with C4-C5 vertebral body fusion. No acute fracture. No aggressive appearing focal osseous lesion or focal pathologic process. Soft tissues and spinal canal: No prevertebral fluid or swelling. No visible canal hematoma. Upper chest: Unremarkable. Other: None. IMPRESSION: 1. No acute intracranial abnormality. 2. No acute displaced fracture or traumatic listhesis of the cervical spine. 3. Diffusely decreased bone density. Electronically Signed   By: Iven Finn M.D.   On: 05/14/2021 23:32   DG Pelvis Portable  Result Date: 05/14/2021 CLINICAL DATA:  trauma EXAM: PORTABLE PELVIS 1-2 VIEWS COMPARISON:  None. FINDINGS: Acute comminuted displaced intertrochanteric fracture of the right femur. Cortical irregularity  versus overlying bowel of the right iliac bone. Query slight asymmetry of the right sacroiliac joint compared to the left. Partially visualized left hip grossly unremarkable on frontal view. No right hip  dislocation. No pelvic bone lesions are seen. IMPRESSION: 1. Acute comminuted displaced intertrochanteric fracture of the right femur. 2. Cortical irregularity versus overlying bowel of the right iliac bone. 3. Query slight asymmetry of the right sacroiliac joint compared to the left. Electronically Signed   By: Iven Finn M.D.   On: 05/14/2021 23:18   CT CHEST ABDOMEN PELVIS W CONTRAST  Result Date: 05/15/2021 CLINICAL DATA:  Blunt poly trauma, fall EXAM: CT CHEST, ABDOMEN, AND PELVIS WITH CONTRAST TECHNIQUE: Multidetector CT imaging of the chest, abdomen and pelvis was performed following the standard protocol during bolus administration of intravenous contrast. RADIATION DOSE REDUCTION: This exam was performed according to the departmental dose-optimization program which includes automated exposure control, adjustment of the mA and/or kV according to patient size and/or use of iterative reconstruction technique. CONTRAST:  134mL OMNIPAQUE IOHEXOL 300 MG/ML  SOLN COMPARISON:  None. FINDINGS: CT CHEST FINDINGS Cardiovascular: Extensive multi-vessel coronary artery calcification. Mild global cardiomegaly. Left subclavian dual lead pacemaker is in place with leads within the right atrium and right ventricle toward the apex. No pericardial effusion. Central pulmonary arteries are of normal caliber. The thoracic aorta is of normal caliber. Mild atherosclerotic calcification. Mediastinum/Nodes: No pathologic thoracic adenopathy. Esophagus unremarkable. No pneumomediastinum. No mediastinal hematoma. Lungs/Pleura: Scattered areas of parenchymal scarring are noted within the left upper lung zone and right lung base. No confluent pulmonary infiltrate. No pneumothorax or pleural effusion. Central airways are widely patent. Musculoskeletal: Osseous structures are diffusely osteopenic. Acute minimally displaced right acromial fracture noted. Healed right clavicle fracture noted. 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 Status post cholecystectomy. No biliary dilatation. Pancreas: Unremarkable Spleen: Unremarkable Adrenals/Urinary Tract: Adrenal glands are unremarkable. 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 No hydronephrosis. No enhancing cortical mass. No intrarenal or ureteral calculi. The bladder is unremarkable. Stomach/Bowel: Surgical changes of Roux-en-Y gastric bypass are identified. Moderate stool throughout the colon without evidence of obstruction. The stomach, small bowel, and large bowel are otherwise unremarkable without evidence of obstruction or focal inflammation. Appendix normal. No free intraperitoneal gas or fluid. Vascular/Lymphatic: Aortic atherosclerosis. No enlarged abdominal or pelvic lymph nodes. Reproductive: Prostate is unremarkable. Other: No abdominal wall hernia. Musculoskeletal: Comminuted, overriding intratrochanteric fracture of the right hip with varus angulation noted. Muscular thickening in intramuscular infiltration in keeping with intramuscular and interstitial hemorrhage surrounding the fracture. Subcutaneous infiltration within the posterolateral soft tissues of the proximal right thigh is in keeping with a subcutaneous hematoma at this level. The osseous structures are diffusely osteopenic. Grade 1 anterolisthesis L5-S1. Degenerative changes are noted within the hips bilaterally. A 3.1 cm lytic lesion is seen within the left femoral neck demonstrating a small sinus tract anteriorly, unchanged from prior examination of 05/31/2019. No associated pathologic fracture. IMPRESSION: Acute minimally displaced fracture of the right acromion. Acute comminuted angulated intratrochanteric fracture of the right hip with moderate surrounding intramuscular and  interstitial hemorrhage. Extensive multi-vessel coronary artery calcification. Mild global cardiomegaly. 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 No obstruction. Moderate stool throughout the colon without evidence of obstruction. Status post Roux-en-Y gastric bypass.  Marked diffuse osteopenia. 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 No associated pathologic fracture. These results were called by telephone at the time of interpretation on 05/15/2021 at 12:00 am to provider Reather Laurence , who verbally acknowledged these results. Electronically Signed   By: Fidela Salisbury M.D.   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 Electronically Signed   By: Iven Finn M.D.   On: 05/14/2021 23:27   Result Date: 05/14/2021 CLINICAL DATA:  trauma EXAM: PORTABLE CHEST 1 VIEW.  Patient is rotated. COMPARISON:  CT heart 11/11/2020 FINDINGS: Left chest wall 2 lead cardiac pacemaker in grossly appropriate position. Cardiomegaly. The heart and mediastinal contours are unchanged. Query Amplatzer device overlying the heart. Slightly elevated left hemidiaphragm. Left base atelectasis. No focal consolidation. No pulmonary edema. No pleural effusion. No pneumothorax. No acute osseous abnormality. IMPRESSION: No active disease. Electronically Signed: By: Iven Finn M.D. On: 05/14/2021 23:14   DG FEMUR, MIN 2 VIEWS RIGHT  Result Date: 05/15/2021 CLINICAL DATA:  Fracture. EXAM: RIGHT FEMUR 2 VIEWS COMPARISON:  None. FINDINGS: There is a minimally displaced comminuted intertrochanteric fracture of the proximal right femur. The remaining bony structures are intact. No dislocation. Mild degenerative  changes are present at the knee. The soft tissues are unremarkable. IMPRESSION: Minimally displaced comminuted intertrochanteric fracture of the right hip. Electronically Signed   By: Brett Fairy M.D.   On: 05/15/2021 01:22    Anti-infectives: Anti-infectives (From admission, onward)    None        Assessment/Plan Mechanical GLF   Plan R intertrochanteric femur fx - ortho c/s R acromion fx - ortho c/s Hypotension - resolved with 1u pRBC, no active extrav on CT FEN - NPO until ortho c/s DVT - SCDs, hold chemical ppx  Dispo -  no new complaints on repeat evaluation and now intrabdominal injuries. CT head negative. recommend orthopedic surgery consult as above. We will sign off but please do not hesitate to contact us with any questions or concerns or reconsult if needed.     LOS: 0 days   [facility] 05/15/2021, 8:16 AM Please see Amion for pager number during day hours 7:00am-4:30pm

## 2021-05-15 NOTE — Assessment & Plan Note (Addendum)
The patient is on Suboxone chronically.  This is being continued. ?

## 2021-05-15 NOTE — H&P (Addendum)
Jack Neal   PATIENT NAME: Jack Neal    MR#:  [id number]  DATE OF BIRTH:  1945/09/04  DATE OF ADMISSION:  05/14/2021  PRIMARY CARE PHYSICIAN: [facility]   Patient is coming from: Home  REQUESTING/REFERRING PHYSICIAN: Montine Circle, PA-C  CHIEF COMPLAINT:   Chief Complaint  Patient presents with   Trauma    Fall    HISTORY OF PRESENT ILLNESS:  Jack Neal is a 76 y.o. Caucasian male with medical history significant for atrial fibrillation, depression, GERD, PTSD, peripheral neuropathy, obstructive sleep apnea on CPAP, status post pacemaker, status post Watchman device, who presented to the ER with acute onset of accidental mechanical fall.  The patient was standing near his bed and was trying to pull his slippers when he reached down and unfortunately lost balance and fell backwards into the right side hitting the right parietal area as well as his right hip and shoulder.  He had subsequent pain in the right shoulder and hip.  He has chronic dizziness without any recent worsening.  No presyncope or syncope.  No chest pain or palpitations.  No paresthesias or focal muscle weakness.  No cough or wheezing or hemoptysis.  No recent travels or surgeries.  He denied any fever or chills.  No dysuria, oliguria or hematuria or flank pain.  ED Course: Upon presentation to the emergency room vital signs were within normal.  Labs revealed calcium of 8.5, albumin 3.4 and total protein of 6 with alk phos of 143, otherwise unremarkable CMP. EKG as reviewed by me : EKG showed paced rhythm with rate of 70. Imaging: Portable chest ray showed no acute cardiopulmonary disease. Po pelvic x-ray showed acute comminuted displaced intertrochanteric fracture of the right femur and cortical irregularity versus overlying bowel of the right iliac bone and query slight asymmetry of the right sacroiliac joint compared to the left.  Right shoulder x-ray showed acute minimally fracture of the  acromion.  2 view femur x-ray showed minimally displaced computed intratrochanteric fracture of the right hip.  The patient was given 50 mcg of IV fentanyl X4, Robaxin 500 g IV and normal saline 500 mL bolus.  He will be admitted to a surgical telemetry bed for further evaluation and management.   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   Hypotension    Major depressive disorder 10/01/2020   Neuropathy    OSA on CPAP 10/01/2020   Pacemaker    Peripheral polyneuropathy 10/01/2020   Presence of cardiac pacemaker 10/01/2020   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   Watchman FLX 20   PTSD (post-traumatic stress disorder) 10/01/2020    PAST SURGICAL HISTORY:   Past Surgical History:  Procedure Laterality Date   LEFT ATRIAL APPENDAGE OCCLUSION N/A 01/02/2021   Procedure: LEFT ATRIAL APPENDAGE OCCLUSION;  Surgeon: Vickie Epley, MD;  Location: [facility];  Service: Cardiovascular;  Laterality: N/A;   TEE WITHOUT CARDIOVERSION N/A 01/02/2021   Procedure: TRANSESOPHAGEAL ECHOCARDIOGRAM (TEE);  Surgeon: Vickie Epley, MD;  Location: [facility];  Service: Cardiovascular;  Laterality: N/A;   TEE WITHOUT CARDIOVERSION N/A 02/17/2021   Procedure: TRANSESOPHAGEAL ECHOCARDIOGRAM (TEE);  Surgeon: Geralynn Rile,  MD;  Location: [facility];  Service: Cardiovascular;  Laterality: N/A;    SOCIAL HISTORY:   Social History   Tobacco Use   Smoking status: Former    Types: Cigarettes   Smokeless tobacco: Never  Substance Use Topics   Alcohol use: Not Currently    FAMILY HISTORY:   Family History  Problem Relation Age of Onset   Heart attack  Mother    Ulcers Father    Brain cancer Sister    Heart disease Neg Hx     DRUG ALLERGIES:   Allergies  Allergen Reactions   Amphetamine Rash and Other (See Comments)    Altered mental status- amnesia (also)   Diazepam Rash    Other reaction(s): Amnesia, Mental Status Changes (intolerance), Other amnesia    Zoloft [Sertraline] Anxiety, Rash and Other (See Comments)    Panic attacks and Cardiovascular Arrest (ALSO)    Gabapentin     Tremors/jerking, hallucinations, dry mouth   Gluten Meal Diarrhea   Penicillin G Rash    REVIEW OF SYSTEMS:   ROS As per history of present illness. All pertinent systems were reviewed above. Constitutional, HEENT, cardiovascular, respiratory, GI, GU, musculoskeletal, neuro, psychiatric, endocrine, integumentary and hematologic systems were reviewed and are otherwise negative/unremarkable except for positive findings mentioned above in the HPI.   MEDICATIONS AT HOME:   Prior to Admission medications   Medication Sig Start Date End Date Taking? Authorizing Provider  acetaminophen (TYLENOL) 500 MG tablet Take 500 mg by mouth every 6 (six) hours as needed for moderate pain.   Yes [provider]  aspirin EC 81 MG tablet Take 1 tablet (81 mg total) by mouth daily. Swallow whole. 12/03/20  Yes Vickie Epley, MD  azithromycin (ZITHROMAX) 500 MG tablet Take 1 tablet ($RemoveB'500mg'hdKGGsZP$ ) one hour prior to all dental visits until 07/03/2021. 02/17/21  Yes Kathyrn Drown D, NP  bumetanide (BUMEX) 2 MG tablet Take 4 mg by mouth daily as needed (edema).   Yes [provider]  buprenorphine-naloxone (SUBOXONE) 2-0.5 mg SUBL SL tablet Place 1 tablet under the tongue daily. 01/11/20  Yes [provider]  calcium elemental as carbonate (TUMS ULTRA 1000) 400 MG chewable tablet Chew 1,000 mg by mouth daily as needed for heartburn.   Yes [provider]  camphor-menthol Timoteo Ace) lotion Apply 1 application topically daily as needed for  itching.   Yes [provider]  Carboxymethylcellulose Sodium (ARTIFICIAL TEARS OP) Place 1 drop into both eyes 3 (three) times daily as needed (dry eyes).   Yes [provider]  Cholecalciferol (VITAMIN D) 50 MCG (2000 UT) tablet Take 2,000 Units by mouth 2 (two) times daily.   Yes [provider]  clopidogrel (PLAVIX) 75 MG tablet Take 1 tablet (75 mg total) by mouth daily. 02/17/21 08/16/21 Yes Vickie Epley, MD  Cyanocobalamin (VITAMIN B-12 PO) Take 6 mcg by mouth in the morning and at bedtime.   Yes [provider]  diclofenac Sodium (VOLTAREN) 1 % GEL Apply 2 g topically daily. For feet pain 07/03/20  Yes [provider]  doxepin (SINEQUAN) 10 MG capsule Take 10 mg by mouth at bedtime. 09/05/20  Yes [provider]  DULoxetine (CYMBALTA) 30 MG capsule Take 30-60 mg by mouth See admin instructions. 60 mg in the morning  30 mg at bedtime   Yes [provider]  ferrous sulfate 325 (65 FE) MG tablet Take 325 mg by mouth every Monday, Wednesday, and Friday. 07/18/19  Yes [provider]  fluticasone (FLONASE) 50 MCG/ACT nasal spray Place 2 sprays into both nostrils daily as needed for allergies or rhinitis.   Yes [provider]  galantamine (RAZADYNE ER) 8 MG 24 hr capsule Take 16 mg by mouth at bedtime. 03/28/21  Yes [provider]  hydrOXYzine (ATARAX/VISTARIL) 10 MG tablet Take 1 tablet (10 mg total) by mouth 3 (three) times daily as needed for anxiety (Severe jerking/tremors.). Patient taking differently: Take 20 mg by mouth at bedtime. 11/12/20  Yes Modena Jansky, MD  ketoconazole (NIZORAL) 2 % shampoo Apply 1 application topically every Monday, Wednesday, and Friday. 02/03/21  Yes [provider]  Javier Docker Oil 1000 MG CAPS Take 1,000 mg by mouth daily.   Yes [provider]  lidocaine (LIDODERM) 5 % Place 1 patch onto the skin daily. feet 07/03/20  Yes [provider]  Lidocaine  HCl 4 % CREA Apply 1 g topically daily as needed (pain). [place]   Yes [provider]  loperamide (IMODIUM A-D) 2 MG tablet Take 2 mg by mouth 3 (three) times daily as needed for diarrhea or loose stools.   Yes [provider]  MAGNESIUM PO Take 375 mg by mouth in the morning and at bedtime.   Yes [provider]  memantine (NAMENDA) 10 MG tablet Take 10-20 mg by mouth See admin instructions. 10 mg in the morning  20 mg at bedtime 03/28/21  Yes [provider]  Multiple Vitamin (MULTIVITAMIN) capsule Take 1 capsule by mouth daily.   Yes [provider]  Pancrelipase, Lip-Prot-Amyl, (CREON PO) Take 36,000 Units by mouth 3 (three) times daily with meals.   Yes [provider]  POTASSIUM PO Take 450 mg by mouth in the morning and at bedtime.   Yes [provider]  prazosin (MINIPRESS) 1 MG capsule Take 2 mg by mouth at bedtime. 04/08/21  Yes [provider]  Probiotic CHEW Chew 3 capsules by mouth daily.   Yes [provider]  Simethicone 180 MG CAPS Take 180 mg by mouth daily as needed (gas).   Yes [provider]  sodium chloride (OCEAN) 0.65 % SOLN nasal spray Place 1 spray into both nostrils at bedtime.   Yes [provider]  Sodium Fluoride 1.1 % PSTE Place 1 application onto teeth 2 (two) times daily.   Yes [provider]  VITAMIN A PO Take 4,000 Units by mouth daily.   Yes [provider]  vitamin C (ASCORBIC ACID) 250 MG tablet Take 250 mg by mouth 2 (two) times daily.   Yes [provider]  zinc sulfate 220 (50 Zn) MG capsule Take 220 mg by mouth daily. 04/10/21  Yes [provider]  pantoprazole (PROTONIX) 40 MG tablet Take 1 tablet (40 mg total) by mouth daily. Patient not taking: Reported on 05/15/2021 01/04/21   Kathyrn Drown D, NP      VITAL SIGNS:  Blood pressure (!) 97/57, pulse 71, temperature 98.7 F (37.1 C), temperature source Oral, resp. rate 11,  height $Remov'5\' 11"'tsyMBy$  (1.803 m), weight 77.1 kg, SpO2 96 %.  PHYSICAL EXAMINATION:  Physical Exam  GENERAL:  76 y.o.-year-old Caucasian male patient lying in the bed with no acute distress.  EYES: Pupils equal, round, reactive to light and accommodation. No scleral icterus. Extraocular muscles intact.  HEENT: Head atraumatic, normocephalic. Oropharynx and nasopharynx clear.  NECK:  Supple, no jugular venous distention. No thyroid enlargement, no tenderness.  LUNGS: Normal breath sounds bilaterally, no wheezing, rales,rhonchi or crepitation.  No use of accessory muscles of respiration.  CARDIOVASCULAR: Regular rate and rhythm, S1, S2 normal. No murmurs, rubs, or gallops.  ABDOMEN: Soft, nondistended, nontender. Bowel sounds present. No organomegaly or mass.  EXTREMITIES: No pedal edema, cyanosis, or clubbing. Musculoskeletal: Right shoulder tenderness over his acromion. Right lateral hip tenderness. NEUROLOGIC: Cranial nerves II through XII are intact. Muscle strength 5/5 in all extremities. Sensation intact. Gait not checked.  PSYCHIATRIC: The patient is alert and oriented x 3.  Normal affect and good eye contact. SKIN: No obvious rash, lesion, or ulcer.   LABORATORY PANEL:   CBC Recent Labs  Lab 05/14/21 2239  WBC 5.2  HGB 11.8*  HCT 37.0*  PLT 124*   ------------------------------------------------------------------------------------------------------------------  Chemistries  Recent Labs  Lab 05/14/21 2239  NA 139  K 4.2  CL 103  CO2 29  GLUCOSE 98  BUN 13  CREATININE 0.82  CALCIUM 8.5*  AST 20  ALT 12  ALKPHOS 143*  BILITOT 1.2   ------------------------------------------------------------------------------------------------------------------  Cardiac Enzymes No results for input(s): TROPONINI in the last 168 hours. ------------------------------------------------------------------------------------------------------------------  RADIOLOGY:  DG Shoulder  Right  Result Date: 05/14/2021 CLINICAL DATA:  fall EXAM: RIGHT SHOULDER - 2+ VIEW COMPARISON:  Chest x-ray 01/02/2021 FINDINGS: Acute minimally displaced fracture of the acromion. No other acute fracture identified. No shoulder dislocation. There is no evidence of arthropathy or other focal bone abnormality. Soft tissues are unremarkable. IMPRESSION: Acute minimally displaced fracture of the acromion. Electronically Signed   By: Iven Finn M.D.   On: 05/14/2021 23:23   CT HEAD WO CONTRAST (5MM)  Result Date: 05/14/2021 CLINICAL DATA:  Polytrauma, blunt; Head trauma, minor (Age >= 65y) EXAM: CT HEAD WITHOUT CONTRAST CT CERVICAL SPINE WITHOUT CONTRAST TECHNIQUE: Multidetector CT imaging of the head and cervical spine was performed following the standard protocol without intravenous contrast. Multiplanar CT image reconstructions of the cervical spine were also generated. RADIATION DOSE REDUCTION: This exam was performed according to the departmental dose-optimization program which includes automated exposure control, adjustment of the mA and/or kV according to patient size and/or use of iterative reconstruction technique. COMPARISON:  None. FINDINGS: CT HEAD FINDINGS BRAIN: BRAIN Cerebral ventricle sizes are concordant with the degree of cerebral volume loss. Patchy and confluent areas of decreased attenuation are noted throughout the deep and periventricular white matter of the cerebral hemispheres bilaterally, compatible with chronic microvascular ischemic disease. No evidence of large-territorial acute infarction. No parenchymal hemorrhage. No mass lesion. No extra-axial collection. No mass effect or midline shift. No hydrocephalus. Basilar cisterns are patent. Vascular: No hyperdense vessel. Atherosclerotic calcifications are present within the cavernous internal carotid arteries. Skull: No acute fracture or focal lesion. Sinuses/Orbits: Paranasal sinuses and mastoid air cells are clear. Bilateral lens  replacement. Otherwise the orbits are unremarkable. Other: None. CT CERVICAL SPINE FINDINGS Alignment: Grade anterolisthesis C2 on C3. Skull base and vertebrae: Diffusely decreased bone density. Multilevel severe degenerative changes of the spine with C4-C5 vertebral body fusion. No acute fracture. No aggressive appearing focal osseous lesion or focal pathologic process. Soft tissues and spinal canal: No prevertebral fluid or swelling. No visible canal hematoma. Upper chest: Unremarkable. Other: None. IMPRESSION: 1. No acute intracranial abnormality. 2. No acute displaced fracture or traumatic listhesis of the cervical spine. 3. Diffusely decreased bone density. Electronically Signed   By: Iven Finn M.D.   On: 05/14/2021 23:32   CT Cervical Spine Wo Contrast  Result Date: 05/14/2021 CLINICAL DATA:  Polytrauma, blunt; Head trauma, minor (Age >= 65y) EXAM: CT HEAD  WITHOUT CONTRAST CT CERVICAL SPINE WITHOUT CONTRAST TECHNIQUE: Multidetector CT imaging of the head and cervical spine was performed following the standard protocol without intravenous contrast. Multiplanar CT image reconstructions of the cervical spine were also generated. RADIATION DOSE REDUCTION: This exam was performed according to the departmental dose-optimization program which includes automated exposure control, adjustment of the mA and/or kV according to patient size and/or use of iterative reconstruction technique. COMPARISON:  None. FINDINGS: CT HEAD FINDINGS BRAIN: BRAIN Cerebral ventricle sizes are concordant with the degree of cerebral volume loss. Patchy and confluent areas of decreased attenuation are noted throughout the deep and periventricular white matter of the cerebral hemispheres bilaterally, compatible with chronic microvascular ischemic disease. No evidence of large-territorial acute infarction. No parenchymal hemorrhage. No mass lesion. No extra-axial collection. No mass effect or midline shift. No hydrocephalus. Basilar  cisterns are patent. Vascular: No hyperdense vessel. Atherosclerotic calcifications are present within the cavernous internal carotid arteries. Skull: No acute fracture or focal lesion. Sinuses/Orbits: Paranasal sinuses and mastoid air cells are clear. Bilateral lens replacement. Otherwise the orbits are unremarkable. Other: None. CT CERVICAL SPINE FINDINGS Alignment: Grade anterolisthesis C2 on C3. Skull base and vertebrae: Diffusely decreased bone density. Multilevel severe degenerative changes of the spine with C4-C5 vertebral body fusion. No acute fracture. No aggressive appearing focal osseous lesion or focal pathologic process. Soft tissues and spinal canal: No prevertebral fluid or swelling. No visible canal hematoma. Upper chest: Unremarkable. Other: None. IMPRESSION: 1. No acute intracranial abnormality. 2. No acute displaced fracture or traumatic listhesis of the cervical spine. 3. Diffusely decreased bone density. Electronically Signed   By: Iven Finn M.D.   On: 05/14/2021 23:32   DG Pelvis Portable  Result Date: 05/14/2021 CLINICAL DATA:  trauma EXAM: PORTABLE PELVIS 1-2 VIEWS COMPARISON:  None. FINDINGS: Acute comminuted displaced intertrochanteric fracture of the right femur. Cortical irregularity versus overlying bowel of the right iliac bone. Query slight asymmetry of the right sacroiliac joint compared to the left. Partially visualized left hip grossly unremarkable on frontal view. No right hip dislocation. No pelvic bone lesions are seen. IMPRESSION: 1. Acute comminuted displaced intertrochanteric fracture of the right femur. 2. Cortical irregularity versus overlying bowel of the right iliac bone. 3. Query slight asymmetry of the right sacroiliac joint compared to the left. Electronically Signed   By: Iven Finn M.D.   On: 05/14/2021 23:18   CT CHEST ABDOMEN PELVIS W CONTRAST  Result Date: 05/15/2021 CLINICAL DATA:  Blunt poly trauma, fall EXAM: CT CHEST, ABDOMEN, AND PELVIS WITH  CONTRAST TECHNIQUE: Multidetector CT imaging of the chest, abdomen and pelvis was performed following the standard protocol during bolus administration of intravenous contrast. RADIATION DOSE REDUCTION: This exam was performed according to the departmental dose-optimization program which includes automated exposure control, adjustment of the mA and/or kV according to patient size and/or use of iterative reconstruction technique. CONTRAST:  140mL OMNIPAQUE IOHEXOL 300 MG/ML  SOLN COMPARISON:  None. FINDINGS: CT CHEST FINDINGS Cardiovascular: Extensive multi-vessel coronary artery calcification. Mild global cardiomegaly. Left subclavian dual lead pacemaker is in place with leads within the right atrium and right ventricle toward the apex. No pericardial effusion. Central pulmonary arteries are of normal caliber. The thoracic aorta is of normal caliber. Mild atherosclerotic calcification. Mediastinum/Nodes: No pathologic thoracic adenopathy. Esophagus unremarkable. No pneumomediastinum. No mediastinal hematoma. Lungs/Pleura: Scattered areas of parenchymal scarring are noted within the left upper lung zone and right lung base. No confluent pulmonary infiltrate. No pneumothorax or pleural effusion. Central airways are widely patent.  Musculoskeletal: Osseous structures are diffusely osteopenic. Acute minimally displaced right acromial fracture noted. Healed right clavicle fracture noted. 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 Status post cholecystectomy. No biliary dilatation. Pancreas: Unremarkable Spleen: Unremarkable Adrenals/Urinary Tract: Adrenal glands are unremarkable. 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 No hydronephrosis. No enhancing cortical mass. No intrarenal or ureteral  calculi. The bladder is unremarkable. Stomach/Bowel: Surgical changes of Roux-en-Y gastric bypass are identified. Moderate stool throughout the colon without evidence of obstruction. The stomach, small bowel, and large bowel are otherwise unremarkable without evidence of obstruction or focal inflammation. Appendix normal. No free intraperitoneal gas or fluid. Vascular/Lymphatic: Aortic atherosclerosis. No enlarged abdominal or pelvic lymph nodes. Reproductive: Prostate is unremarkable. Other: No abdominal wall hernia. Musculoskeletal: Comminuted, overriding intratrochanteric fracture of the right hip with varus angulation noted. Muscular thickening in intramuscular infiltration in keeping with intramuscular and interstitial hemorrhage surrounding the fracture. Subcutaneous infiltration within the posterolateral soft tissues of the proximal right thigh is in keeping with a subcutaneous hematoma at this level. The osseous structures are diffusely osteopenic. Grade 1 anterolisthesis L5-S1. Degenerative changes are noted within the hips bilaterally. A 3.1 cm lytic lesion is seen within the left femoral neck demonstrating a small sinus tract anteriorly, unchanged from prior examination of 05/31/2019. No associated pathologic fracture. IMPRESSION: Acute minimally displaced fracture of the right acromion. Acute comminuted angulated intratrochanteric fracture of the right hip with moderate surrounding intramuscular and interstitial hemorrhage. Extensive multi-vessel coronary artery calcification. Mild global cardiomegaly. 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 No obstruction. Moderate stool throughout the colon without evidence of obstruction. Status post Roux-en-Y gastric bypass. Marked diffuse osteopenia. 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 No  associated pathologic fracture. These results were called by telephone at the time of interpretation on 05/15/2021 at 12:00 am to provider Reather Laurence , who verbally acknowledged these results. Electronically Signed   By: Fidela Salisbury M.D.   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 Electronically Signed   By: Iven Finn M.D.   On: 05/14/2021 23:27   Result Date: 05/14/2021 CLINICAL DATA:  trauma EXAM: PORTABLE CHEST 1 VIEW.  Patient is rotated. COMPARISON:  CT heart 11/11/2020 FINDINGS: Left chest wall 2 lead cardiac pacemaker in grossly appropriate position. Cardiomegaly. The heart and mediastinal contours are unchanged. Query Amplatzer device overlying the heart. Slightly elevated left hemidiaphragm. Left base atelectasis. No focal consolidation. No pulmonary edema. No pleural effusion. No pneumothorax. No acute osseous abnormality. IMPRESSION: No active disease. Electronically Signed: By: Iven Finn M.D. On: 05/14/2021 23:14   DG FEMUR, MIN 2 VIEWS RIGHT  Result Date: 05/15/2021 CLINICAL DATA:  Fracture. EXAM: RIGHT FEMUR 2 VIEWS COMPARISON:  None. FINDINGS: There is a minimally displaced comminuted intertrochanteric fracture of the proximal right femur. The remaining bony structures are intact. No dislocation. Mild degenerative changes are present at the knee. The soft tissues are unremarkable. IMPRESSION: Minimally displaced comminuted intertrochanteric fracture of the right hip. Electronically Signed   By: Brett Fairy M.D.   On:  05/15/2021 01:22      IMPRESSION AND PLAN:  Assessment and Plan: * Closed right hip fracture ([place])- (present on admission) - The patient will be admitted to a surgical telemetry bed. - This fracture is secondary to accidental mechanical fall. - Pain management to be provided with as needed IV  morphine sulfate and if ineffective IV fentanyl. - We will hold off Suboxone. - Orthopedic consultation will be obtained. - Dr. Mable Fill was notified about the patient and is aware. - We will keep him n.p.o. - Aspirin and Plavix will be held off. - He has a history of CHF but denies any history of coronary artery disease, CVA, diabetes mellitus on insulin or renal failure.  Is considered above average risk for perioperative cardiovascular events per the revised cardiac risk index.  He has no current pulmonary issues.  Closed fracture of acromion - Orthopedic consultation will be obtained as mentioned above. - Pain management as above.  OSA on CPAP We will continue CPAP nightly.  Permanent atrial fibrillation ([place])- (present on admission) He is status post PPM and Watchman left atrial appendage occlusive device.  Dementia without behavioral disturbance ([place]) We will continue Razadyne ER and Namenda.  Opioid dependence ([place]) - The patient is on chronic Suboxone. - We will hold off Suboxone and place him on as needed IV morphine sulfate and fentanyl as needed.  Pancreatic insufficiency - We will continue pancrelipase.       DVT prophylaxis: SCDs. Advanced Care Planning:  Code Status: The patient is DNR/DNI. Family Communication:  The plan of care was discussed in details with the patient (and wife). I answered all questions. The patient agreed to proceed with the above mentioned plan. Further management will depend upon hospital course. Disposition Plan: Back to previous home environment Consults called: Orthopedic consult. All the records are reviewed and case discussed with ED provider.  Status is: Inpatient  At the time of the admission, it appears that the appropriate admission status for this patient is inpatient.  This is judged to be reasonable and necessary in order to provide the required intensity of service to ensure the patient's safety given the presenting symptoms,  physical exam findings and initial radiographic and laboratory data in the context of comorbid conditions.  The patient requires inpatient status due to high intensity of service, high risk of further deterioration and high frequency of surveillance required.  I certify that at the time of admission, it is my clinical judgment that the patient will require inpatient hospital care extending more than 2 midnights.                            Dispo: The patient is from: Home              Anticipated d/c is to: Home              Patient currently is not medically stable to d/c.              Difficult to place patient: No  Christel Mormon M.D on 05/15/2021 at 3:03 AM  [facility]   From 7 PM-7 AM, contact night-coverage [email or web address]  CC: Primary care physician; Clinic, [facility]

## 2021-05-15 NOTE — TOC CAGE-AID Note (Signed)
Transition of Care (TOC) - CAGE-AID Screening ? ? ?Patient Details  ?Name: Jack Neal ?MRN: [id number] ?Date of Birth: 08/05/1945 ? ?Transition of Care (TOC) CM/SW Contact:    ?Katha Hamming, RN ?Phone Number: ?05/15/2021, 12:25 AM ? ? ?Clinical Narrative: ? ?Patient arrives after a ground level fall resulting in right intertrochanteric fracture. Denies drug and alcohol use.  ? ?CAGE-AID Screening: ?  ? ?Have You Ever Felt You Ought to Cut Down on Your Drinking or Drug Use?: No ?Have People Annoyed You By Critizing Your Drinking Or Drug Use?: No ?Have You Felt Bad Or Guilty About Your Drinking Or Drug Use?: No ?Have You Ever Had a Drink or Used Drugs First Thing In The Morning to Steady Your Nerves or to Get Rid of a Hangover?: No ?CAGE-AID Score: 0 ? ?Substance Abuse Education Offered: No ? ?  ? ? ? ? ? ? ?

## 2021-05-15 NOTE — Consult Note (Signed)
Reason for Consult:Right hip/acromion fxs Referring Physician: Osvaldo Shipper Time called: 0730 Time at bedside: 0905   Jack Neal is an 76 y.o. male.  HPI: Jack Neal was in his bedroom yesterday standing to put on his shoes when he lost his balance and fell. He had immediate pain in his right hip and shoulder and could not get up. He was brought to the ED where x-rays showed a right hip and right acromion fx and orthopedic surgery was consulted. He lives at home with his wife and usually ambulates with the aid of a Rollator or 2 canes.  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   Watchman FLX 20   PTSD (post-traumatic stress disorder) 10/01/2020    Past Surgical History:  Procedure Laterality Date   LEFT ATRIAL APPENDAGE OCCLUSION N/A 01/02/2021   Procedure: LEFT ATRIAL APPENDAGE OCCLUSION;  Surgeon: Lanier Prude, MD;  Location: [facility] INVASIVE CV LAB;  Service: Cardiovascular;  Laterality: N/A;   TEE WITHOUT CARDIOVERSION N/A 01/02/2021   Procedure: TRANSESOPHAGEAL ECHOCARDIOGRAM (TEE);  Surgeon: Lanier Prude, MD;  Location: [facility] INVASIVE CV LAB;  Service: Cardiovascular;  Laterality: N/A;   TEE WITHOUT CARDIOVERSION N/A 02/17/2021   Procedure: TRANSESOPHAGEAL ECHOCARDIOGRAM (TEE);  Surgeon: Sande Rives, MD;  Location: [facility] ENDOSCOPY;  Service: Cardiovascular;  Laterality: N/A;    Family History  Problem Relation Age of Onset   Heart attack Mother    Ulcers Father    Brain  cancer Sister    Heart disease Neg Hx     Social History:  reports that he has quit smoking. His smoking use included cigarettes. He has never used smokeless tobacco. He reports that he does not currently use alcohol. He reports that he does not use drugs.  Allergies:  Allergies  Allergen Reactions   Amphetamine Rash and Other (See Comments)    Altered mental status- amnesia (also)   Diazepam Rash    Other reaction(s): Amnesia, Mental Status Changes (intolerance), Other amnesia    Zoloft [Sertraline] Anxiety, Rash and Other (See Comments)    Panic attacks and Cardiovascular Arrest (ALSO)    Gabapentin     Tremors/jerking, hallucinations, dry mouth   Gluten Meal Diarrhea   Penicillin G Rash    Medications: I have reviewed the patient's current medications.  Results for orders placed or performed during the hospital encounter of 05/14/21 (from the past 48 hour(s))  Comprehensive metabolic panel     Status: Abnormal   Collection Time: 05/14/21 10:39 PM  Result Value Ref Range   Sodium 139 135 - 145 mmol/L   Potassium 4.2 3.5 - 5.1 mmol/L   Chloride 103 98 - 111 mmol/L   CO2 29 22 - 32 mmol/L   Glucose, Bld 98 70 - 99 mg/dL    Comment: Glucose reference range applies only to samples taken after fasting for at least 8 hours.   BUN 13 8 - 23 mg/dL   Creatinine, Ser 8.03 0.61 - 1.24 mg/dL  Calcium 8.5 (L) 8.9 - 10.3 mg/dL   Total Protein 6.0 (L) 6.5 - 8.1 g/dL   Albumin 3.4 (L) 3.5 - 5.0 g/dL   AST 20 15 - 41 U/L   ALT 12 0 - 44 U/L   Alkaline Phosphatase 143 (H) 38 - 126 U/L   Total Bilirubin 1.2 0.3 - 1.2 mg/dL   GFR, Estimated >45 >80 mL/min    Comment: (NOTE) Calculated using the CKD-EPI Creatinine Equation (2021)    Anion gap 7 5 - 15    Comment: Performed at [facility], [address], [place], [place] [address]  CBC     Status: Abnormal   Collection Time: 05/14/21 10:39 PM  Result Value Ref Range   WBC 5.2 4.0 - 10.5 K/uL   RBC 3.59 (L) 4.22 - 5.81  MIL/uL   Hemoglobin 11.8 (L) 13.0 - 17.0 g/dL   HCT 82.5 (L) 05.3 - 97.6 %   MCV 103.1 (H) 80.0 - 100.0 fL   MCH 32.9 26.0 - 34.0 pg   MCHC 31.9 30.0 - 36.0 g/dL   RDW 73.4 19.3 - 79.0 %   Platelets 124 (L) 150 - 400 K/uL    Comment: Immature Platelet Fraction may be clinically indicated, consider ordering this additional test [id number] REPEATED TO VERIFY    nRBC 0.0 0.0 - 0.2 %    Comment: Performed at [facility], [address] [place], [place] [address]  Ethanol     Status: None   Collection Time: 05/14/21 10:39 PM  Result Value Ref Range   Alcohol, Ethyl (B) <10 <10 mg/dL    Comment: (NOTE) Lowest detectable limit for serum alcohol is 10 mg/dL.  For medical purposes only. Performed at [facility], [address], [place], [place] [address]   Protime-INR     Status: None   Collection Time: 05/14/21 10:39 PM  Result Value Ref Range   Prothrombin Time 15.2 11.4 - 15.2 seconds   INR 1.2 0.8 - 1.2    Comment: (NOTE) INR goal varies based on device and disease states. Performed at [facility], [address], [place], [place] [address]   Sample to Blood Bank     Status: None   Collection Time: 05/14/21 10:50 PM  Result Value Ref Range   Blood Bank Specimen SAMPLE AVAILABLE FOR TESTING    Sample Expiration      05/15/2021,2359 Performed at [facility], [address], [place], [place] [address]   Type and screen Ordered by PROVIDER DEFAULT     Status: None   Collection Time: 05/14/21 10:50 PM  Result Value Ref Range   ABO/RH(D) B POS    Antibody Screen NEG    Sample Expiration 05/17/2021,2359    Unit Number [id number]    Blood Component Type RED CELLS,LR    Unit division 00    Status of Unit ISSUED,FINAL    Transfusion Status OK TO TRANSFUSE    Crossmatch Result COMPATIBLE   Resp Panel by RT-PCR (Flu A&B, Covid) Nasopharyngeal Swab     Status: None   Collection Time: 05/14/21 11:39 PM   Specimen: Nasopharyngeal Swab;  Nasopharyngeal(NP) swabs in vial transport medium  Result Value Ref Range   SARS Coronavirus 2 by RT PCR NEGATIVE NEGATIVE    Comment: (NOTE) SARS-CoV-2 target nucleic acids are NOT DETECTED.  The SARS-CoV-2 RNA is generally detectable in upper respiratory specimens during the acute phase of infection. The lowest concentration of  SARS-CoV-2 viral copies this assay can detect is 138 copies/mL. A negative result does not preclude SARS-Cov-2 infection and should not be used as the sole basis for treatment or other patient management decisions. A negative result may occur with  improper specimen collection/handling, submission of specimen other than nasopharyngeal swab, presence of viral mutation(s) within the areas targeted by this assay, and inadequate number of viral copies(<138 copies/mL). A negative result must be combined with clinical observations, patient history, and epidemiological information. The expected result is Negative.  Fact Sheet for Patients:  EntrepreneurPulse.com.au  Fact Sheet for Healthcare Providers:  IncredibleEmployment.be  This test is no t yet approved or cleared by the [place] FDA and  has been authorized for detection and/or diagnosis of SARS-CoV-2 by FDA under an Emergency Use Authorization (EUA). This EUA will remain  in effect (meaning this test can be used) for the duration of the COVID-19 declaration under Section 564(b)(1) of the Act, 21 U.S.C.section 360bbb-3(b)(1), unless the authorization is terminated  or revoked sooner.       Influenza A by PCR NEGATIVE NEGATIVE   Influenza B by PCR NEGATIVE NEGATIVE    Comment: (NOTE) The Xpert Xpress SARS-CoV-2/FLU/RSV plus assay is intended as an aid in the diagnosis of influenza from Nasopharyngeal swab specimens and should not be used as a sole basis for treatment. Nasal washings and aspirates are unacceptable for Xpert Xpress  SARS-CoV-2/FLU/RSV testing.  Fact Sheet for Patients: EntrepreneurPulse.com.au  Fact Sheet for Healthcare Providers: IncredibleEmployment.be  This test is not yet approved or cleared by the [place] FDA and has been authorized for detection and/or diagnosis of SARS-CoV-2 by FDA under an Emergency Use Authorization (EUA). This EUA will remain in effect (meaning this test can be used) for the duration of the COVID-19 declaration under Section 564(b)(1) of the Act, 21 U.S.C. section 360bbb-3(b)(1), unless the authorization is terminated or revoked.  Performed at [facility], [place] [address] [place], [place] [address]   Provider-confirm verbal Blood Bank order - RBC, Type & Screen; 1 Unit; Order taken: 05/14/2021; 11:46 PM; Level 1 Trauma, Emergency Release; NO units ahead     Status: None   Collection Time: 05/15/21  5:41 AM  Result Value Ref Range   Blood product order confirm      MD AUTHORIZATION REQUESTED Performed at [facility] [address] [place], [place] [address]     DG Shoulder Right  Result Date: 05/14/2021 CLINICAL DATA:  fall EXAM: RIGHT SHOULDER - 2+ VIEW COMPARISON:  Chest x-ray 01/02/2021 FINDINGS: Acute minimally displaced fracture of the acromion. No other acute fracture identified. No shoulder dislocation. There is no evidence of arthropathy or other focal bone abnormality. Soft tissues are unremarkable. IMPRESSION: Acute minimally displaced fracture of the acromion. Electronically Signed   By: Iven Finn M.D.   On: 05/14/2021 23:23   CT HEAD WO CONTRAST (5MM)  Result Date: 05/14/2021 CLINICAL DATA:  Polytrauma, blunt; Head trauma, minor (Age >= 65y) EXAM: CT HEAD WITHOUT CONTRAST CT CERVICAL SPINE WITHOUT CONTRAST TECHNIQUE: Multidetector CT imaging of the head and cervical spine was performed following the standard protocol without intravenous contrast. Multiplanar CT image reconstructions of the cervical  spine were also generated. RADIATION DOSE REDUCTION: This exam was performed according to the departmental dose-optimization program which includes automated exposure control, adjustment of the mA and/or kV according to patient size and/or use of iterative reconstruction technique. COMPARISON:  None. FINDINGS: CT HEAD FINDINGS BRAIN: BRAIN Cerebral ventricle sizes are  concordant with the degree of cerebral volume loss. Patchy and confluent areas of decreased attenuation are noted throughout the deep and periventricular white matter of the cerebral hemispheres bilaterally, compatible with chronic microvascular ischemic disease. No evidence of large-territorial acute infarction. No parenchymal hemorrhage. No mass lesion. No extra-axial collection. No mass effect or midline shift. No hydrocephalus. Basilar cisterns are patent. Vascular: No hyperdense vessel. Atherosclerotic calcifications are present within the cavernous internal carotid arteries. Skull: No acute fracture or focal lesion. Sinuses/Orbits: Paranasal sinuses and mastoid air cells are clear. Bilateral lens replacement. Otherwise the orbits are unremarkable. Other: None. CT CERVICAL SPINE FINDINGS Alignment: Grade anterolisthesis C2 on C3. Skull base and vertebrae: Diffusely decreased bone density. Multilevel severe degenerative changes of the spine with C4-C5 vertebral body fusion. No acute fracture. No aggressive appearing focal osseous lesion or focal pathologic process. Soft tissues and spinal canal: No prevertebral fluid or swelling. No visible canal hematoma. Upper chest: Unremarkable. Other: None. IMPRESSION: 1. No acute intracranial abnormality. 2. No acute displaced fracture or traumatic listhesis of the cervical spine. 3. Diffusely decreased bone density. Electronically Signed   By: Iven Finn M.D.   On: 05/14/2021 23:32   CT Cervical Spine Wo Contrast  Result Date: 05/14/2021 CLINICAL DATA:  Polytrauma, blunt; Head trauma, minor (Age >=  65y) EXAM: CT HEAD WITHOUT CONTRAST CT CERVICAL SPINE WITHOUT CONTRAST TECHNIQUE: Multidetector CT imaging of the head and cervical spine was performed following the standard protocol without intravenous contrast. Multiplanar CT image reconstructions of the cervical spine were also generated. RADIATION DOSE REDUCTION: This exam was performed according to the departmental dose-optimization program which includes automated exposure control, adjustment of the mA and/or kV according to patient size and/or use of iterative reconstruction technique. COMPARISON:  None. FINDINGS: CT HEAD FINDINGS BRAIN: BRAIN Cerebral ventricle sizes are concordant with the degree of cerebral volume loss. Patchy and confluent areas of decreased attenuation are noted throughout the deep and periventricular white matter of the cerebral hemispheres bilaterally, compatible with chronic microvascular ischemic disease. No evidence of large-territorial acute infarction. No parenchymal hemorrhage. No mass lesion. No extra-axial collection. No mass effect or midline shift. No hydrocephalus. Basilar cisterns are patent. Vascular: No hyperdense vessel. Atherosclerotic calcifications are present within the cavernous internal carotid arteries. Skull: No acute fracture or focal lesion. Sinuses/Orbits: Paranasal sinuses and mastoid air cells are clear. Bilateral lens replacement. Otherwise the orbits are unremarkable. Other: None. CT CERVICAL SPINE FINDINGS Alignment: Grade anterolisthesis C2 on C3. Skull base and vertebrae: Diffusely decreased bone density. Multilevel severe degenerative changes of the spine with C4-C5 vertebral body fusion. No acute fracture. No aggressive appearing focal osseous lesion or focal pathologic process. Soft tissues and spinal canal: No prevertebral fluid or swelling. No visible canal hematoma. Upper chest: Unremarkable. Other: None. IMPRESSION: 1. No acute intracranial abnormality. 2. No acute displaced fracture or  traumatic listhesis of the cervical spine. 3. Diffusely decreased bone density. Electronically Signed   By: Iven Finn M.D.   On: 05/14/2021 23:32   DG Pelvis Portable  Result Date: 05/14/2021 CLINICAL DATA:  trauma EXAM: PORTABLE PELVIS 1-2 VIEWS COMPARISON:  None. FINDINGS: Acute comminuted displaced intertrochanteric fracture of the right femur. Cortical irregularity versus overlying bowel of the right iliac bone. Query slight asymmetry of the right sacroiliac joint compared to the left. Partially visualized left hip grossly unremarkable on frontal view. No right hip dislocation. No pelvic bone lesions are seen. IMPRESSION: 1. Acute comminuted displaced intertrochanteric fracture of the right femur. 2. Cortical irregularity versus  overlying bowel of the right iliac bone. 3. Query slight asymmetry of the right sacroiliac joint compared to the left. Electronically Signed   By: Iven Finn M.D.   On: 05/14/2021 23:18   CT CHEST ABDOMEN PELVIS W CONTRAST  Result Date: 05/15/2021 CLINICAL DATA:  Blunt poly trauma, fall EXAM: CT CHEST, ABDOMEN, AND PELVIS WITH CONTRAST TECHNIQUE: Multidetector CT imaging of the chest, abdomen and pelvis was performed following the standard protocol during bolus administration of intravenous contrast. RADIATION DOSE REDUCTION: This exam was performed according to the departmental dose-optimization program which includes automated exposure control, adjustment of the mA and/or kV according to patient size and/or use of iterative reconstruction technique. CONTRAST:  133mL OMNIPAQUE IOHEXOL 300 MG/ML  SOLN COMPARISON:  None. FINDINGS: CT CHEST FINDINGS Cardiovascular: Extensive multi-vessel coronary artery calcification. Mild global cardiomegaly. Left subclavian dual lead pacemaker is in place with leads within the right atrium and right ventricle toward the apex. No pericardial effusion. Central pulmonary arteries are of normal caliber. The thoracic aorta is of normal  caliber. Mild atherosclerotic calcification. Mediastinum/Nodes: No pathologic thoracic adenopathy. Esophagus unremarkable. No pneumomediastinum. No mediastinal hematoma. Lungs/Pleura: Scattered areas of parenchymal scarring are noted within the left upper lung zone and right lung base. No confluent pulmonary infiltrate. No pneumothorax or pleural effusion. Central airways are widely patent. Musculoskeletal: Osseous structures are diffusely osteopenic. Acute minimally displaced right acromial fracture noted. Healed right clavicle fracture noted. 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 Status post cholecystectomy. No biliary dilatation. Pancreas: Unremarkable Spleen: Unremarkable Adrenals/Urinary Tract: Adrenal glands are unremarkable. 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 No hydronephrosis. No enhancing cortical mass. No intrarenal or ureteral calculi. The bladder is unremarkable. Stomach/Bowel: Surgical changes of Roux-en-Y gastric bypass are identified. Moderate stool throughout the colon without evidence of obstruction. The stomach, small bowel, and large bowel are otherwise unremarkable without evidence of obstruction or focal inflammation. Appendix normal. No free intraperitoneal gas or fluid. Vascular/Lymphatic: Aortic atherosclerosis. No enlarged abdominal or pelvic lymph nodes. Reproductive: Prostate is unremarkable. Other: No abdominal wall hernia. Musculoskeletal: Comminuted, overriding intratrochanteric fracture of the right hip with varus angulation noted. Muscular thickening in intramuscular infiltration in keeping with intramuscular and interstitial hemorrhage surrounding the fracture. Subcutaneous infiltration within the posterolateral soft tissues of the proximal  right thigh is in keeping with a subcutaneous hematoma at this level. The osseous structures are diffusely osteopenic. Grade 1 anterolisthesis L5-S1. Degenerative changes are noted within the hips bilaterally. A 3.1 cm lytic lesion is seen within the left femoral neck demonstrating a small sinus tract anteriorly, unchanged from prior examination of 05/31/2019. No associated pathologic fracture. IMPRESSION: Acute minimally displaced fracture of the right acromion. Acute comminuted angulated intratrochanteric fracture of the right hip with moderate surrounding intramuscular and interstitial hemorrhage. Extensive multi-vessel coronary artery calcification. Mild global cardiomegaly. 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 No obstruction. Moderate stool throughout the colon without evidence of obstruction. Status post Roux-en-Y gastric bypass. Marked diffuse osteopenia. 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 No associated pathologic fracture. These results were called by telephone at the time of interpretation on 05/15/2021 at 12:00 am to provider Reather Laurence , who verbally  acknowledged these results. Electronically Signed   By: Fidela Salisbury M.D.   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 Electronically Signed   By: Iven Finn M.D.   On: 05/14/2021 23:27   Result Date: 05/14/2021 CLINICAL DATA:  trauma EXAM: PORTABLE CHEST 1 VIEW.  Patient is rotated. COMPARISON:  CT heart 11/11/2020 FINDINGS: Left chest wall 2 lead cardiac pacemaker in grossly appropriate position. Cardiomegaly. The heart and mediastinal contours are unchanged. Query Amplatzer device overlying the heart. Slightly  elevated left hemidiaphragm. Left base atelectasis. No focal consolidation. No pulmonary edema. No pleural effusion. No pneumothorax. No acute osseous abnormality. IMPRESSION: No active disease. Electronically Signed: By: Iven Finn M.D. On: 05/14/2021 23:14   DG FEMUR, MIN 2 VIEWS RIGHT  Result Date: 05/15/2021 CLINICAL DATA:  Fracture. EXAM: RIGHT FEMUR 2 VIEWS COMPARISON:  None. FINDINGS: There is a minimally displaced comminuted intertrochanteric fracture of the proximal right femur. The remaining bony structures are intact. No dislocation. Mild degenerative changes are present at the knee. The soft tissues are unremarkable. IMPRESSION: Minimally displaced comminuted intertrochanteric fracture of the right hip. Electronically Signed   By: Brett Fairy M.D.   On: 05/15/2021 01:22    Review of Systems  HENT:  Negative for ear discharge, ear pain, hearing loss and tinnitus.   Eyes:  Negative for photophobia and pain.  Respiratory:  Negative for cough and shortness of breath.   Cardiovascular:  Negative for chest pain.  Gastrointestinal:  Negative for abdominal pain, nausea and vomiting.  Genitourinary:  Negative for dysuria, flank pain, frequency and urgency.  Musculoskeletal:  Positive for arthralgias (Right hip/shoulder). Negative for back pain, myalgias and neck pain.  Neurological:  Negative for dizziness and headaches.  Hematological:  Does not bruise/bleed easily.  Psychiatric/Behavioral:  The patient is not nervous/anxious.   Blood pressure 103/67, pulse 78, temperature 98.7 F (37.1 C), temperature source Oral, resp. rate 14, height 5\' 11"  (1.803 m), weight 77.1 kg, SpO2 97 %. Physical Exam Constitutional:      General: He is not in acute distress.    Appearance: He is well-developed. He is not diaphoretic.  HENT:     Head: Normocephalic and atraumatic.  Eyes:     General: No scleral icterus.       Right eye: No discharge.        Left eye: No discharge.      Conjunctiva/sclera: Conjunctivae normal.  Cardiovascular:     Rate and Rhythm: Normal rate and regular rhythm.  Pulmonary:     Effort: Pulmonary effort is normal. No respiratory distress.  Musculoskeletal:     Cervical back: Normal range of motion.     Comments: RLE No traumatic wounds, ecchymosis, or rash  Nontender  No knee or ankle effusion  Knee stable to varus/ valgus and anterior/posterior stress  Sens DPN, SPN, TN intact  Motor EHL, ext, flex, evers 5/5  DP 2+, PT 1+, No significant edema  Skin:    General: Skin is warm and dry.  Neurological:     Mental Status: He is alert.  Psychiatric:        Mood and Affect: Mood normal.        Behavior: Behavior normal.    Assessment/Plan: Right hip fx -- Plan IMN today with Dr. Mable Fill. Please keep NPO. Right acromion fx -- Plan non-operative management with  Valentina Gu, PA-C Orthopedic Surgery [phone] 05/15/2021, 9:10 AM

## 2021-05-15 NOTE — Assessment & Plan Note (Addendum)
He is status post PPM and Watchman left atrial appendage occlusive device.  Watchman device was placed on October 2022.  He was on Eliquis and aspirin for 45 days following which he has been on aspirin and Plavix.  To continue until April of this year.   ?After discussions with cardiology Plavix was held for surgery.  Due to transfusion requirements Plavix for was held for a few more days.  Plavix subsequently resumed yesterday.  Continue dual antiplatelet treatment with aspirin and Plavix until at least April of this year.  He will need follow-up with cardiology to determine the end date. ?

## 2021-05-15 NOTE — ED Notes (Signed)
The pt wants something for pain  he has been given all that he can have until 515  he called up to the main ed desk asking for some one to come into the room  he moans and groans until someone comes into the room then he stops ?

## 2021-05-16 ENCOUNTER — Encounter (HOSPITAL_COMMUNITY): Payer: Self-pay | Admitting: Orthopedic Surgery

## 2021-05-16 DIAGNOSIS — E871 Hypo-osmolality and hyponatremia: Secondary | ICD-10-CM | POA: Diagnosis not present

## 2021-05-16 DIAGNOSIS — R946 Abnormal results of thyroid function studies: Secondary | ICD-10-CM

## 2021-05-16 DIAGNOSIS — K5900 Constipation, unspecified: Secondary | ICD-10-CM

## 2021-05-16 DIAGNOSIS — R300 Dysuria: Secondary | ICD-10-CM | POA: Clinically undetermined

## 2021-05-16 DIAGNOSIS — D539 Nutritional anemia, unspecified: Secondary | ICD-10-CM

## 2021-05-16 DIAGNOSIS — E440 Moderate protein-calorie malnutrition: Secondary | ICD-10-CM | POA: Insufficient documentation

## 2021-05-16 DIAGNOSIS — D696 Thrombocytopenia, unspecified: Secondary | ICD-10-CM

## 2021-05-16 LAB — CBC
HCT: 22 % — ABNORMAL LOW (ref 39.0–52.0)
Hemoglobin: 7.3 g/dL — ABNORMAL LOW (ref 13.0–17.0)
MCH: 32.9 pg (ref 26.0–34.0)
MCHC: 33.2 g/dL (ref 30.0–36.0)
MCV: 99.1 fL (ref 80.0–100.0)
Platelets: 98 10*3/uL — ABNORMAL LOW (ref 150–400)
RBC: 2.22 MIL/uL — ABNORMAL LOW (ref 4.22–5.81)
RDW: 14.6 % (ref 11.5–15.5)
WBC: 7.2 10*3/uL (ref 4.0–10.5)
nRBC: 0 % (ref 0.0–0.2)

## 2021-05-16 LAB — BASIC METABOLIC PANEL
Anion gap: 7 (ref 5–15)
BUN: 18 mg/dL (ref 8–23)
CO2: 23 mmol/L (ref 22–32)
Calcium: 7.6 mg/dL — ABNORMAL LOW (ref 8.9–10.3)
Chloride: 100 mmol/L (ref 98–111)
Creatinine, Ser: 0.82 mg/dL (ref 0.61–1.24)
GFR, Estimated: >60 mL/min (ref >60)
Glucose, Bld: 178 mg/dL — ABNORMAL HIGH (ref 70–99)
Potassium: 4.1 mmol/L (ref 3.5–5.1)
Sodium: 130 mmol/L — ABNORMAL LOW (ref 135–145)

## 2021-05-16 LAB — IRON AND TIBC
Iron: 20 ug/dL — ABNORMAL LOW (ref 45–182)
Saturation Ratios: 9 % — ABNORMAL LOW (ref 17.9–39.5)
TIBC: 217 ug/dL — ABNORMAL LOW (ref 250–450)
UIBC: 197 ug/dL

## 2021-05-16 LAB — RETICULOCYTES
Immature Retic Fract: 20.1 % — ABNORMAL HIGH (ref 2.3–15.9)
RBC.: 2.19 MIL/uL — ABNORMAL LOW (ref 4.22–5.81)
Retic Count, Absolute: 48.6 10*3/uL (ref 19.0–186.0)
Retic Ct Pct: 2.2 % (ref 0.4–3.1)

## 2021-05-16 LAB — VITAMIN D 25 HYDROXY (VIT D DEFICIENCY, FRACTURES): Vit D, 25-Hydroxy: 51.15 ng/mL (ref 30–100)

## 2021-05-16 LAB — TSH: TSH: 0.313 u[IU]/mL — ABNORMAL LOW (ref 0.350–4.500)

## 2021-05-16 LAB — URINALYSIS, ROUTINE W REFLEX MICROSCOPIC
Bilirubin Urine: NEGATIVE
Glucose, UA: NEGATIVE mg/dL
Hgb urine dipstick: NEGATIVE
Ketones, ur: NEGATIVE mg/dL
Leukocytes,Ua: NEGATIVE
Nitrite: NEGATIVE
Protein, ur: NEGATIVE mg/dL
Specific Gravity, Urine: 1.012 (ref 1.005–1.030)
pH: 5 (ref 5.0–8.0)

## 2021-05-16 LAB — VITAMIN B12: Vitamin B-12: 598 pg/mL (ref 180–914)

## 2021-05-16 LAB — HEMOGLOBIN AND HEMATOCRIT, BLOOD
HCT: 22.3 % — ABNORMAL LOW (ref 39.0–52.0)
Hemoglobin: 7.3 g/dL — ABNORMAL LOW (ref 13.0–17.0)

## 2021-05-16 LAB — FOLATE: Folate: 11 ng/mL (ref >5.9)

## 2021-05-16 LAB — PREPARE RBC (CROSSMATCH)

## 2021-05-16 LAB — FERRITIN: Ferritin: 52 ng/mL (ref 24–336)

## 2021-05-16 MED ORDER — SODIUM CHLORIDE 0.9 % IV BOLUS
500.0000 mL | Freq: Once | INTRAVENOUS | Status: AC
  Administered 2021-05-16: 500 mL via INTRAVENOUS

## 2021-05-16 MED ORDER — SODIUM CHLORIDE 0.9% IV SOLUTION: Freq: Once | INTRAVENOUS | Status: AC

## 2021-05-16 MED ORDER — CALCIUM CARBONATE 1250 (500 CA) MG PO TABS
1.0000 | ORAL_TABLET | Freq: Three times a day (TID) | ORAL | Status: DC
  Administered 2021-05-16 – 2021-05-19 (×8): 500 mg via ORAL
  Filled 2021-05-16 (×9): qty 1

## 2021-05-16 MED ORDER — JUVEN PO PACK
1.0000 | PACK | Freq: Two times a day (BID) | ORAL | Status: DC
  Administered 2021-05-17 – 2021-05-20 (×7): 1 via ORAL
  Filled 2021-05-16 (×7): qty 1

## 2021-05-16 MED ORDER — BISACODYL 10 MG RE SUPP: 10.0000 mg | Freq: Every day | RECTAL | Status: DC | PRN

## 2021-05-16 NOTE — Assessment & Plan Note (Addendum)
UA noted to be normal.  There was no indication for antibacterials. ?

## 2021-05-16 NOTE — Progress Notes (Addendum)
Orthopaedics Daily Progress Note ? ? ?05/16/2021   4:49 PM ? ?Akari Konkel is a 76 y.o. male 1 Day Post-Op s/p INTRAMEDULLARY (IM) NAIL INTERTROCHANTRIC ? ?Subjective ?Pain controlled.  Denies nausea, vomiting, or fevers. No PT/OOB yet. ? ?Objective ?Vitals:  ? 05/16/21 1229 05/16/21 1506  ?BP: 96/60 103/60  ?Pulse: 82 87  ?Resp: 16   ?Temp: 98.5 ?F (36.9 ?C)   ?SpO2:  100%  ? ? ?Intake/Output Summary (Last 24 hours) at 05/16/2021 1649 ?Last data filed at 05/16/2021 1605 ?Gross per 24 hour  ?Intake 4322.27 ml  ?Output 1150 ml  ?Net 3172.27 ml  ? ? ?Physical Exam ?RLE: ?Dressing clean, dry, and intact ?+DF/PF/EHL ?SILT SP/DP/T ?WWP distally ? ?Assessment ?76 y.o. male s/p Procedure(s) (LRB): ?INTRAMEDULLARY (IM) NAIL INTERTROCHANTRIC (Right) ? ?Plan ?Mobility: Out of bed with PT/OT ?Pain control: Continue to wean/titrate to appropriate oral regimen ?DVT Prophylaxis: Lovenox 30 mg twice daily x6 weeks ?Further surgical plans: None ?RUE: Weightbearing as tolerated, limited abduction and forward flexion to 90 degrees due to acromial fracture ?LUE: Weightbearing as tolerated, no restrictions ?RLE: Weightbearing as tolerated, no restrictions ?LLE: Weightbearing as tolerated, no restrictions ?Disposition: Per primary team, as medically appropriate ?Dressing care: Keep AQUACEL on and dry for up to 14 days.  Do not allow surgical area to get wet before that.  Remove AQUACEL dressing after 14 days and allow area to get wet in shower but DO NOT SUBMERGE until wound is evaluated in clinic.  In most cases skin glue is used and no additional dressing is necessary.  ?Follow-up: Please call [facility] [phone]) to schedule follow-op appointment for 2 weeks after surgery. ? ?I have verified that my discharge instructions and follow-up information have been entered in the Discharge Navigator in Epic.  These should automatically populate in the AVS.  Please print the AVS in its entirety and ensure that the  patient or a responsible party has a complete copy of the AVS before they are discharged.  If there are questions regarding discharge instructions or follow-up before the AVS is generated, please check the Discharge Navigator before attempting to contact the surgeon/office.  If unsure how to access the Discharge Navigator or the information contained in the Discharge Navigator, or how to generate/print the AVS, please contact the appropriate Nurse, learning disability.  ? ?Georgeanna Harrison M.D. ?Orthopaedic Surgery ?[facility] ? ?

## 2021-05-16 NOTE — Evaluation (Signed)
Physical Therapy Evaluation Patient Details Name: Jack Neal MRN: [id number] DOB: [birth year] Today's Date: 05/16/2021  History of Present Illness  Pt is a 76 y.o. male admitted 05/14/21 after falling backwards while trying to pull on his slippers, denies LOC; became hypotensive after imaging requiring transfusion. Pt sustained R intertrochanteric hip fx, R posterior acromial process fx. S/p R hip IMN 3/2. PMH includes PAF, neuropathy, pacemaker, OSA on CPAP, PTSD, depression, chronic pain syndrome.   Clinical Impression  Pt presents with an overall decrease in functional mobility secondary to above. PTA, pt mod indep with rollator, lives with wife, enjoys being active in community. Educ on precautions, positioning, activity recommendations and importance of mobility. Today, pt able to initiate transfer and pre-gait activity with RW and up to modA. Pt motivated to participate and regain mod indep PLOF; pt is an excellent candidate for intensive CIR-level therapies to maximize functional mobility and independence prior to d/c home.      Recommendations for follow up therapy are one component of a multi-disciplinary discharge planning process, led by the attending physician.  Recommendations may be updated based on patient status, additional functional criteria and insurance authorization.  Follow Up Recommendations Acute inpatient rehab (3hours/day)    Assistance Recommended at Discharge Intermittent Supervision/Assistance  Patient can return home with the following  A little help with walking and/or transfers;A little help with bathing/dressing/bathroom;Assistance with cooking/housework;Assist for transportation;Help with stairs or ramp for entrance    Equipment Recommendations None recommended by PT  Recommendations for Other Services   Occupational Therapy   Functional Status Assessment Patient has had a recent decline in their functional status and demonstrates the ability to make significant  improvements in function in a reasonable and predictable amount of time.     Precautions / Restrictions Precautions Precautions: Fall Restrictions Weight Bearing Restrictions: Yes RUE Weight Bearing: Weight bearing as tolerated RLE Weight Bearing: Weight bearing as tolerated Other Position/Activity Restrictions: RUE shoulder </ 90' flexion and abduction      Mobility  Bed Mobility Overal bed mobility: Needs Assistance Bed Mobility: Supine to Sit     Supine to sit: Mod assist     General bed mobility comments: Pt very adamant to perform as independently as possible, ultimately needing modA for RLE management to EOB    Transfers Overall transfer level: Needs assistance Equipment used: Rolling walker (2 wheels) Transfers: Sit to/from Stand, Bed to chair/wheelchair/BSC Sit to Stand: Min assist, From elevated surface, Mod assist   Step pivot transfers: Mod assist       General transfer comment: Increased time and effort with min-modA for trunk elevation and stability, modA for eccentric control to sit, pt with good awareness of sequencing from prior PT experience. Performing 2' of step pivot from bed to recliner with RW and up to modA; difficulty taking complete step with RLE, but able to scoot it    Ambulation/Gait               General Gait Details: deferred further distance secondary to pain, headache and lightheaded; pt currently receiving blood transfusion for hypotension  Stairs            Wheelchair Mobility    Modified Rankin (Stroke Patients Only)       Balance Overall balance assessment: Needs assistance Sitting-balance support: No upper extremity supported, Feet supported Sitting balance-Leahy Scale: Fair     Standing balance support: Reliant on assistive device for balance Standing balance-Leahy Scale: Poor  Pertinent Vitals/Pain Pain Assessment Pain Assessment: Faces Faces Pain Scale: Hurts  even more Pain Location: RLE Pain Descriptors / Indicators: Discomfort Pain Intervention(s): Limited activity within patient's tolerance, Monitored during session, Premedicated before session    Home Living Family/patient expects to be discharged to:: Private residence Living Arrangements: Spouse/significant other Available Help at Discharge: Family;Available 24 hours/day Type of Home: House Home Access: Level entry       Home Layout: One level Home Equipment: Rollator (4 wheels);Tub bench;Transport chair;Cane - single point Additional Comments: reports wife not able to physically assist with mobility, but could help some with ADLs    Prior Function Prior Level of Function : Independent/Modified Independent             Mobility Comments: Mod indep ambulating "with my rollator 98% of the time, 1% with two canes and the other 1% to my bathroom just holding onto the wall" - reports need for DME is not a strength issue, but a balance issue. Currently working with HHPT services. Retired [employer] ADLs Comments: Typically sponge bathes at sink, will use tub transfer bench 1x/wk for shower     Hand Dominance   Dominant Hand: Right    Extremity/Trunk Assessment   Upper Extremity Assessment Upper Extremity Assessment: Overall WFL for tasks assessed    Lower Extremity Assessment Lower Extremity Assessment: RLE deficits/detail RLE Deficits / Details: s/p R hip IMN; knee ext <3/5 but able to achieve partial range LAQ, hip <3/5 with expected post-op pain and weakness RLE Sensation: history of peripheral neuropathy RLE Coordination: decreased gross motor       Communication   Communication: No difficulties  Cognition Arousal/Alertness: Awake/alert Behavior During Therapy: WFL for tasks assessed/performed Overall Cognitive Status: Within Functional Limits for tasks assessed                                 General Comments: verbose, but pleasant, motivated and  redirectable        General Comments General comments (skin integrity, edema, etc.): Post-transfer BP 117/63, HR 80s, SpO2 98% on RA    Exercises     Assessment/Plan    PT Assessment Patient needs continued PT services  PT Problem List Decreased strength;Decreased range of motion;Decreased activity tolerance;Decreased balance;Decreased mobility;Decreased knowledge of use of DME;Decreased knowledge of precautions;Cardiopulmonary status limiting activity;Pain       PT Treatment Interventions DME instruction;Gait training;Stair training;Functional mobility training;Therapeutic activities;Therapeutic exercise;Balance training;Patient/family education    PT Goals (Current goals can be found in the Care Plan section)  Acute Rehab PT Goals Patient Stated Goal: return home PT Goal Formulation: With patient Time For Goal Achievement: 05/30/21 Potential to Achieve Goals: Good    Frequency Min 5X/week     Co-evaluation               AM-PAC PT "6 Clicks" Mobility  Outcome Measure Help needed turning from your back to your side while in a flat bed without using bedrails?: A Lot Help needed moving from lying on your back to sitting on the side of a flat bed without using bedrails?: A Lot Help needed moving to and from a bed to a chair (including a wheelchair)?: A Little Help needed standing up from a chair using your arms (e.g., wheelchair or bedside chair)?: A Lot Help needed to walk in hospital room?: Total Help needed climbing 3-5 steps with a railing? : Total 6 Click Score: 11  End of Session Equipment Utilized During Treatment: Gait belt Activity Tolerance: Patient tolerated treatment well Patient left: in chair;with call bell/phone within reach;with chair alarm set Nurse Communication: Mobility status PT Visit Diagnosis: Other abnormalities of gait and mobility (R26.89);Muscle weakness (generalized) (M62.81);Pain Pain - Right/Left: Right Pain - part of body: Leg     Time: 1111-1140 PT Time Calculation (min) (ACUTE ONLY): 29 min   Charges:   PT Evaluation $PT Eval Moderate Complexity: 1 Mod        Ina Homes, PT, DPT Acute Rehabilitation Services  Pager [phone] Office [phone]  Malachy Chamber 05/16/2021, 12:30 PM

## 2021-05-16 NOTE — Progress Notes (Signed)
[facility] PROGRESS NOTE   Jack Neal [id number] DOB: 1945-04-20 DOA: 05/14/2021  1 DOS: the patient was seen and examined on 05/16/2021  PCP: [facility]  Brief History and Hospital Course:  76 y.o. Caucasian male with medical history significant for atrial fibrillation, depression, GERD, PTSD, peripheral neuropathy, obstructive sleep apnea on CPAP, status post pacemaker, status post Watchman device, who presented to the ER after sustaining a mechanical fall.  He fell on his right side.  Came in with the right hip and shoulder pain.  Found to have right hip fracture along with a fracture of the right acromion.  He was hospitalized for further management.  Patient underwent surgery on 3/2.  Consultants: Orthopedics.  Trauma surgery.  Procedures: None yet    Subjective: Patient sitting on the bed eating breakfast.  Denies any dizziness lightheadedness chest pain shortness of breath nausea vomiting.  Denies any significant pain.  Does complain of burning sensation with urinating.     Assessment/Plan:   * Closed right hip fracture [facility]) Patient underwent ORIF on 3/2.  Pain seems to be reasonably well controlled.  Osteopenia suggested on imaging studies.  Vitamin D level is pending.   PT and OT evaluation.  Closed fracture of acromion Management per orthopedics.  Looks like this is being treated conservatively.  Weightbearing as tolerated.  Permanent atrial fibrillation ([place]) He is status post PPM and Watchman left atrial appendage occlusive device.  Watchman device was placed on October 2022.  He was on Eliquis and aspirin for 45 days following which he has been on aspirin and Plavix.  To continue until April of this year.   Discussed with cardiology who are okay with holding the antiplatelet agents if necessary and to resume as soon as it safe to do so.  Patient was continued on aspirin.   Plavix held.  We will continue to hold today since he has significant  anemia.    Dementia without behavioral disturbance ([place]) We will continue [place] [facility] and Namenda..  Stable.  Thrombocytopenia ([place]) Noted to have low platelet counts.  Noted to be 98,000 today.  He is on Lovenox for DVT prophylaxis.  Continue to trend for now.  If it continues to drop may have to discontinue Lovenox.  Macrocytic anemia Acute blood loss anemia  Drop in hemoglobin noted.  He is noted to be mildly hypotensive though asymptomatic.  He will be given normal saline bolus and will be ordered 1 unit of PRBC.  Anemia panel reviewed.  No clear-cut deficiencies identified.  Hyponatremia Continue to monitor for now.  Pancreatic insufficiency We will continue pancrelipase.  Opioid dependence ([place]) The patient is on Suboxone chronically.  This is being continued as of today.  Constipation Moderate stool burden noted on CT scan.  Laxatives and stool softeners.  May need suppository/enema.    OSA on CPAP We will continue CPAP nightly.  Abnormal thyroid function test TSH was checked due to constipation but noted to be 0.313.  Likely sick euthyroid.  Will recommend that this be rechecked in a few weeks.  No signs or symptoms of hyperthyroidism.  Dysuria Check UA.     DVT Prophylaxis: On Lovenox twice a day per orthopedics Code Status: DNR Family Communication: Discussed with patient Disposition Plan: To be determined  Status is: Inpatient Remains inpatient appropriate because: Right hip fracture      Medications: Scheduled:  sodium chloride   Intravenous Once   acetaminophen  500 mg Oral Q6H   acidophilus  1 capsule Oral Daily   vitamin C  250 mg Oral BID   aspirin EC  81 mg Oral Daily   buprenorphine-naloxone  1 tablet Sublingual Daily   cholecalciferol  2,000 Units Oral BID   doxepin  10 mg Oral QHS   DULoxetine  60 mg Oral Daily   And   DULoxetine  30 mg Oral QHS   enoxaparin (LOVENOX) injection  30 mg Subcutaneous Q12H   ferrous sulfate  325 mg Oral Q  M,W,F   folic acid  1 mg Oral Daily   galantamine  16 mg Oral QHS   hydrOXYzine  20 mg Oral QHS   lipase/protease/amylase  36,000 Units Oral TID WC   memantine  10 mg Oral Daily   And   memantine  20 mg Oral QHS   multivitamin with minerals  1 tablet Oral Daily   polyethylene glycol  17 g Oral Daily   senna-docusate  2 tablet Oral BID   sodium chloride  1 spray Each Nare QHS   vitamin B-12  50 mcg Oral Daily   zinc sulfate  220 mg Oral Daily   Continuous:  sodium chloride 75 mL/hr at 05/16/21 0648   methocarbamol (ROBAXIN) IV     [id number], calcium carbonate, diclofenac Sodium, fluticasone, HYDROcodone-acetaminophen, HYDROcodone-acetaminophen, magnesium hydroxide, menthol-cetylpyridinium **OR** phenol, methocarbamol **OR** methocarbamol (ROBAXIN) IV, metoCLOPramide **OR** metoCLOPramide (REGLAN) injection, morphine injection, ondansetron **OR** ondansetron (ZOFRAN) IV, polyvinyl alcohol, simethicone  Antibiotics: Anti-infectives (From admission, onward)    Start     Dose/Rate Route Frequency Ordered Stop   05/15/21 2000  ceFAZolin (ANCEF) IVPB 2g/100 mL premix        2 g 200 mL/hr over 30 Minutes Intravenous Every 6 hours 05/15/21 1814 05/16/21 0846   05/15/21 1534  vancomycin (VANCOCIN) powder  Status:  Discontinued          As needed 05/15/21 1535 05/15/21 1646   05/15/21 1350  ceFAZolin (ANCEF) 2-4 GM/100ML-% IVPB       Note to Pharmacy: Roosvelt Maser N: cabinet override      05/15/21 1350 05/16/21 0159       Objective:  Vital Signs  Vitals:   05/16/21 0326 05/16/21 0742 05/16/21 0832 05/16/21 0833  BP: (!) 82/48 (!) 99/55 (!) 87/52 (!) 81/52  Pulse: 70     Resp: 19     Temp: 97.8 F (36.6 C) 98.3 F (36.8 C)    TempSrc: Axillary Oral    SpO2: 92% 95%    Weight:      Height:        Intake/Output Summary (Last 24 hours) at 05/16/2021 0853 Last data filed at 05/16/2021 0743 Gross per 24 hour  Intake 4748.46 ml  Output 1050 ml  Net 3698.46 ml    Filed Weights   05/14/21 2339  Weight: 77.1 kg    General appearance: Awake alert.  In no distress Resp: Clear to auscultation bilaterally.  Normal effort Cardio: S1-S2 is normal regular.  No S3-S4.  No rubs murmurs or bruit GI: Abdomen is soft.  Nontender nondistended.  Bowel sounds are present normal.  No masses organomegaly Extremities: No edema.   Neurologic: Alert and oriented x3.  No focal neurological deficits.     Lab Results:  Data Reviewed: I have personally reviewed labs and imaging study reports  CBC: Recent Labs  Lab 05/14/21 2239 05/15/21 0822 05/16/21 0211  WBC 5.2 6.2 7.2  HGB 11.8* 10.1* 7.3*  HCT 37.0* 32.3* 22.0*  MCV 103.1* 100.0 99.1  PLT 124* 110* 98*    Basic Metabolic Panel: Recent Labs  Lab 05/14/21 2239 05/15/21 0822 05/16/21 0211  NA 139 139 130*  K 4.2 3.8 4.1  CL 103 105 100  CO2 29 27 23   GLUCOSE 98 114* 178*  BUN 13 14 18   CREATININE 0.82 0.60* 0.82  CALCIUM 8.5* 8.2* 7.6*    GFR: Estimated Creatinine Clearance: 84.2 mL/min (by C-G formula based on SCr of 0.82 mg/dL).  Liver Function Tests: Recent Labs  Lab 05/14/21 2239  AST 20  ALT 12  ALKPHOS 143*  BILITOT 1.2  PROT 6.0*  ALBUMIN 3.4*     Coagulation Profile: Recent Labs  Lab 05/14/21 2239  INR 1.2     Recent Results (from the past 240 hour(s))  Resp Panel by RT-PCR (Flu A&B, Covid) Nasopharyngeal Swab     Status: None   Collection Time: 05/14/21 11:39 PM   Specimen: Nasopharyngeal Swab; Nasopharyngeal(NP) swabs in vial transport medium  Result Value Ref Range Status   SARS Coronavirus 2 by RT PCR NEGATIVE NEGATIVE Final    Comment: (NOTE) SARS-CoV-2 target nucleic acids are NOT DETECTED.  The SARS-CoV-2 RNA is generally detectable in upper respiratory specimens during the acute phase of infection. The lowest concentration of SARS-CoV-2 viral copies this assay can detect is 138 copies/mL. A negative result does not preclude SARS-Cov-2 infection  and should not be used as the sole basis for treatment or other patient management decisions. A negative result may occur with  improper specimen collection/handling, submission of specimen other than nasopharyngeal swab, presence of viral mutation(s) within the areas targeted by this assay, and inadequate number of viral copies(<138 copies/mL). A negative result must be combined with clinical observations, patient history, and epidemiological information. The expected result is Negative.  Fact Sheet for Patients:  EntrepreneurPulse.com.au  Fact Sheet for Healthcare Providers:  IncredibleEmployment.be  This test is no t yet approved or cleared by the [place] FDA and  has been authorized for detection and/or diagnosis of SARS-CoV-2 by FDA under an Emergency Use Authorization (EUA). This EUA will remain  in effect (meaning this test can be used) for the duration of the COVID-19 declaration under Section 564(b)(1) of the Act, 21 U.S.C.section 360bbb-3(b)(1), unless the authorization is terminated  or revoked sooner.       Influenza A by PCR NEGATIVE NEGATIVE Final   Influenza B by PCR NEGATIVE NEGATIVE Final    Comment: (NOTE) The Xpert Xpress SARS-CoV-2/FLU/RSV plus assay is intended as an aid in the diagnosis of influenza from Nasopharyngeal swab specimens and should not be used as a sole basis for treatment. Nasal washings and aspirates are unacceptable for Xpert Xpress SARS-CoV-2/FLU/RSV testing.  Fact Sheet for Patients: EntrepreneurPulse.com.au  Fact Sheet for Healthcare Providers: IncredibleEmployment.be  This test is not yet approved or cleared by the [place] FDA and has been authorized for detection and/or diagnosis of SARS-CoV-2 by FDA under an Emergency Use Authorization (EUA). This EUA will remain in effect (meaning this test can be used) for the duration of the COVID-19 declaration  under Section 564(b)(1) of the Act, 21 U.S.C. section 360bbb-3(b)(1), unless the authorization is terminated or revoked.  Performed at [facility], [place] [address], [place], [place] [address]   Surgical pcr screen     Status: None   Collection Time: 05/15/21  1:48 PM   Specimen: Nasal Mucosa; Nasal Swab  Result Value Ref Range Status   MRSA, PCR NEGATIVE NEGATIVE Final   Staphylococcus aureus  NEGATIVE NEGATIVE Final    Comment: (NOTE) The Xpert SA Assay (FDA approved for NASAL specimens in patients 19 years of age and older), is one component of a comprehensive surveillance program. It is not intended to diagnose infection nor to guide or monitor treatment. Performed at [facility], [place] [address], [place], [place] [address]       Radiology Studies: DG Shoulder Right  Result Date: 05/14/2021 CLINICAL DATA:  fall EXAM: RIGHT SHOULDER - 2+ VIEW COMPARISON:  Chest x-ray 01/02/2021 FINDINGS: Acute minimally displaced fracture of the acromion. No other acute fracture identified. No shoulder dislocation. There is no evidence of arthropathy or other focal bone abnormality. Soft tissues are unremarkable. IMPRESSION: Acute minimally displaced fracture of the acromion. Electronically Signed   By: Iven Finn M.D.   On: 05/14/2021 23:23   CT HEAD WO CONTRAST (5MM)  Result Date: 05/14/2021 CLINICAL DATA:  Polytrauma, blunt; Head trauma, minor (Age >= 65y) EXAM: CT HEAD WITHOUT CONTRAST CT CERVICAL SPINE WITHOUT CONTRAST TECHNIQUE: Multidetector CT imaging of the head and cervical spine was performed following the standard protocol without intravenous contrast. Multiplanar CT image reconstructions of the cervical spine were also generated. RADIATION DOSE REDUCTION: This exam was performed according to the departmental dose-optimization program which includes automated exposure control, adjustment of the mA and/or kV according to patient size and/or use of iterative reconstruction  technique. COMPARISON:  None. FINDINGS: CT HEAD FINDINGS BRAIN: BRAIN Cerebral ventricle sizes are concordant with the degree of cerebral volume loss. Patchy and confluent areas of decreased attenuation are noted throughout the deep and periventricular white matter of the cerebral hemispheres bilaterally, compatible with chronic microvascular ischemic disease. No evidence of large-territorial acute infarction. No parenchymal hemorrhage. No mass lesion. No extra-axial collection. No mass effect or midline shift. No hydrocephalus. Basilar cisterns are patent. Vascular: No hyperdense vessel. Atherosclerotic calcifications are present within the cavernous internal carotid arteries. Skull: No acute fracture or focal lesion. Sinuses/Orbits: Paranasal sinuses and mastoid air cells are clear. Bilateral lens replacement. Otherwise the orbits are unremarkable. Other: None. CT CERVICAL SPINE FINDINGS Alignment: Grade anterolisthesis C2 on C3. Skull base and vertebrae: Diffusely decreased bone density. Multilevel severe degenerative changes of the spine with C4-C5 vertebral body fusion. No acute fracture. No aggressive appearing focal osseous lesion or focal pathologic process. Soft tissues and spinal canal: No prevertebral fluid or swelling. No visible canal hematoma. Upper chest: Unremarkable. Other: None. IMPRESSION: 1. No acute intracranial abnormality. 2. No acute displaced fracture or traumatic listhesis of the cervical spine. 3. Diffusely decreased bone density. Electronically Signed   By: Iven Finn M.D.   On: 05/14/2021 23:32   CT Cervical Spine Wo Contrast  Result Date: 05/14/2021 CLINICAL DATA:  Polytrauma, blunt; Head trauma, minor (Age >= 65y) EXAM: CT HEAD WITHOUT CONTRAST CT CERVICAL SPINE WITHOUT CONTRAST TECHNIQUE: Multidetector CT imaging of the head and cervical spine was performed following the standard protocol without intravenous contrast. Multiplanar CT image reconstructions of the cervical spine  were also generated. RADIATION DOSE REDUCTION: This exam was performed according to the departmental dose-optimization program which includes automated exposure control, adjustment of the mA and/or kV according to patient size and/or use of iterative reconstruction technique. COMPARISON:  None. FINDINGS: CT HEAD FINDINGS BRAIN: BRAIN Cerebral ventricle sizes are concordant with the degree of cerebral volume loss. Patchy and confluent areas of decreased attenuation are noted throughout the deep and periventricular white matter of the cerebral hemispheres bilaterally, compatible with chronic microvascular ischemic disease. No evidence  of large-territorial acute infarction. No parenchymal hemorrhage. No mass lesion. No extra-axial collection. No mass effect or midline shift. No hydrocephalus. Basilar cisterns are patent. Vascular: No hyperdense vessel. Atherosclerotic calcifications are present within the cavernous internal carotid arteries. Skull: No acute fracture or focal lesion. Sinuses/Orbits: Paranasal sinuses and mastoid air cells are clear. Bilateral lens replacement. Otherwise the orbits are unremarkable. Other: None. CT CERVICAL SPINE FINDINGS Alignment: Grade anterolisthesis C2 on C3. Skull base and vertebrae: Diffusely decreased bone density. Multilevel severe degenerative changes of the spine with C4-C5 vertebral body fusion. No acute fracture. No aggressive appearing focal osseous lesion or focal pathologic process. Soft tissues and spinal canal: No prevertebral fluid or swelling. No visible canal hematoma. Upper chest: Unremarkable. Other: None. IMPRESSION: 1. No acute intracranial abnormality. 2. No acute displaced fracture or traumatic listhesis of the cervical spine. 3. Diffusely decreased bone density. Electronically Signed   By: Tish FredericksonMorgane  Naveau M.D.   On: 05/14/2021 23:32   DG Pelvis Portable  Result Date: 05/14/2021 CLINICAL DATA:  trauma EXAM: PORTABLE PELVIS 1-2 VIEWS COMPARISON:  None.  FINDINGS: Acute comminuted displaced intertrochanteric fracture of the right femur. Cortical irregularity versus overlying bowel of the right iliac bone. Query slight asymmetry of the right sacroiliac joint compared to the left. Partially visualized left hip grossly unremarkable on frontal view. No right hip dislocation. No pelvic bone lesions are seen. IMPRESSION: 1. Acute comminuted displaced intertrochanteric fracture of the right femur. 2. Cortical irregularity versus overlying bowel of the right iliac bone. 3. Query slight asymmetry of the right sacroiliac joint compared to the left. Electronically Signed   By: Tish FredericksonMorgane  Naveau M.D.   On: 05/14/2021 23:18   CT CHEST ABDOMEN PELVIS W CONTRAST  Result Date: 05/15/2021 CLINICAL DATA:  Blunt poly trauma, fall EXAM: CT CHEST, ABDOMEN, AND PELVIS WITH CONTRAST TECHNIQUE: Multidetector CT imaging of the chest, abdomen and pelvis was performed following the standard protocol during bolus administration of intravenous contrast. RADIATION DOSE REDUCTION: This exam was performed according to the departmental dose-optimization program which includes automated exposure control, adjustment of the mA and/or kV according to patient size and/or use of iterative reconstruction technique. CONTRAST:  100mL OMNIPAQUE IOHEXOL 300 MG/ML  SOLN COMPARISON:  None. FINDINGS: CT CHEST FINDINGS Cardiovascular: Extensive multi-vessel coronary artery calcification. Mild global cardiomegaly. Left subclavian dual lead pacemaker is in place with leads within the right atrium and right ventricle toward the apex. No pericardial effusion. Central pulmonary arteries are of normal caliber. The thoracic aorta is of normal caliber. Mild atherosclerotic calcification. Mediastinum/Nodes: No pathologic thoracic adenopathy. Esophagus unremarkable. No pneumomediastinum. No mediastinal hematoma. Lungs/Pleura: Scattered areas of parenchymal scarring are noted within the left upper lung zone and right lung  base. No confluent pulmonary infiltrate. No pneumothorax or pleural effusion. Central airways are widely patent. Musculoskeletal: Osseous structures are diffusely osteopenic. Acute minimally displaced right acromial fracture noted. Healed right clavicle fracture noted. 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 Status post cholecystectomy. No biliary dilatation. Pancreas: Unremarkable Spleen: Unremarkable Adrenals/Urinary Tract: Adrenal glands are unremarkable. 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 No hydronephrosis. No enhancing cortical mass. No intrarenal or ureteral calculi. The bladder is unremarkable. Stomach/Bowel: Surgical changes of Roux-en-Y gastric bypass are identified. Moderate stool throughout the colon without evidence of obstruction. The stomach, small bowel, and large bowel  are otherwise unremarkable without evidence of obstruction or focal inflammation. Appendix normal. No free intraperitoneal gas or fluid. Vascular/Lymphatic: Aortic atherosclerosis. No enlarged abdominal or pelvic lymph nodes. Reproductive: Prostate is unremarkable. Other: No abdominal wall hernia. Musculoskeletal: Comminuted, overriding intratrochanteric fracture of the right hip with varus angulation noted. Muscular thickening in intramuscular infiltration in keeping with intramuscular and interstitial hemorrhage surrounding the fracture. Subcutaneous infiltration within the posterolateral soft tissues of the proximal right thigh is in keeping with a subcutaneous hematoma at this level. The osseous structures are diffusely osteopenic. Grade 1 anterolisthesis L5-S1. Degenerative changes are noted within the hips bilaterally. A 3.1 cm lytic lesion is seen within the left femoral neck demonstrating  a small sinus tract anteriorly, unchanged from prior examination of 05/31/2019. No associated pathologic fracture. IMPRESSION: Acute minimally displaced fracture of the right acromion. Acute comminuted angulated intratrochanteric fracture of the right hip with moderate surrounding intramuscular and interstitial hemorrhage. Extensive multi-vessel coronary artery calcification. Mild global cardiomegaly. 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 No obstruction. Moderate stool throughout the colon without evidence of obstruction. Status post Roux-en-Y gastric bypass. Marked diffuse osteopenia. 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 No associated pathologic fracture. These results were called by telephone at the time of interpretation on 05/15/2021 at 12:00 am to provider Reather Laurence , who verbally acknowledged these results. Electronically Signed   By: Fidela Salisbury M.D.   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 Electronically Signed   By: Iven Finn M.D.   On: 05/14/2021 23:27   Result Date: 05/14/2021 CLINICAL DATA:  trauma EXAM: PORTABLE CHEST 1 VIEW.  Patient is rotated. COMPARISON:  CT heart 11/11/2020 FINDINGS: Left chest wall 2 lead cardiac pacemaker in grossly appropriate position. Cardiomegaly. The heart and mediastinal contours are unchanged. Query Amplatzer device overlying the heart. Slightly elevated left hemidiaphragm. Left base atelectasis. No focal consolidation. No pulmonary edema. No pleural effusion. No pneumothorax. No acute osseous abnormality. IMPRESSION: No active disease. Electronically Signed: By: Iven Finn M.D. On: 05/14/2021 23:14   DG C-Arm 1-60  Min-No Report  Result Date: 05/15/2021 Fluoroscopy was utilized by the requesting physician.  No radiographic interpretation.   DG C-Arm 1-60 Min-No Report  Result Date: 05/15/2021 Fluoroscopy was utilized by the requesting physician.  No radiographic interpretation.   DG FEMUR, MIN 2 VIEWS RIGHT  Result Date: 05/15/2021 CLINICAL DATA:  Elective surgery. EXAM: RIGHT FEMUR 2 VIEWS COMPARISON:  Right femur radiographs earlier today FLUOROSCOPY: Fluoroscopy Time: 84 seconds Radiation Exposure Index: 11.52 mGy FINDINGS: Four intraoperative spot fluoroscopic images are provided and demonstrate placement of an intramedullary nail and proximal and distal interlocking screws for treatment of the previously shown intertrochanteric fracture which now demonstrates improved alignment. IMPRESSION: Intraoperative images during ORIF of a right femoral intertrochanteric fracture. Electronically Signed   By: Logan Bores M.D.   On: 05/15/2021 16:31   DG FEMUR, MIN 2 VIEWS RIGHT  Result Date: 05/15/2021 CLINICAL DATA:  Fracture. EXAM: RIGHT FEMUR 2 VIEWS COMPARISON:  None. FINDINGS: There is a minimally displaced comminuted intertrochanteric fracture of the proximal right femur. The remaining bony structures are intact. No dislocation. Mild degenerative changes are present at the knee. The soft tissues are unremarkable. IMPRESSION:  Minimally displaced comminuted intertrochanteric fracture of the right hip. Electronically Signed   By: Brett Fairy M.D.   On: 05/15/2021 01:22   DG FEMUR PORT, MIN 2 VIEWS RIGHT  Result Date: 05/15/2021 CLINICAL DATA:  Postop femur fracture ORIF EXAM: RIGHT FEMUR PORTABLE 2 VIEW COMPARISON:  05/15/2021 FINDINGS: Intertrochanteric fracture on the right in satisfactory alignment. Compression screw and locking rod in the right femur in good position. Right hip joint normal. IMPRESSION: Satisfactory ORIF right intertrochanteric fracture. No immediate complication. Electronically Signed   By:  Franchot Gallo M.D.   On: 05/15/2021 19:05       LOS: 1 day   [facility] Pager on [email or web address]  05/16/2021, 8:53 AM

## 2021-05-16 NOTE — Progress Notes (Signed)
? ?  Inpatient Rehab Admissions Coordinator : ? ?Per therapy recommendations, patient was screened for CIR candidacy by Taisa Deloria RN MSN.  At this time patient appears to be a potential candidate for CIR. I will place a rehab consult per protocol for full assessment. Please call me with any questions. ? ?Stokes Rattigan RN MSN ?Admissions Coordinator ?[phone] ?  ?

## 2021-05-16 NOTE — Progress Notes (Deleted)
Pt is resting comfortably on RA. BiPAP is not needed at this time. Will continue to monitor. ?

## 2021-05-16 NOTE — TOC Initial Note (Signed)
Transition of Care (TOC) - Initial/Assessment Note  ? ? ?Patient Details  ?Name: Jack Neal ?MRN: [id number] ?Date of Birth: 1945-11-18 ? ?Transition of Care [facility]) CM/SW Contact:    ?Sharin Mons, RN ?Phone Number: ?05/16/2021, 2:04 PM ? ?Clinical Narrative:   ? Admitted after a fall. Suffered R intertrochanteric hip fx . ?     -    S/p R hip IMN 3/2        ?From home with wife. Pt is a retiree.PTA required min. assist with ADL's. Active with [facility] ( PT). Pt  pleased with their services and would like to continue usage when needed. ? ?Per PT's recommendation:  Acute inpatient rehab (3hours/day). Pt screened CIR. Rehab consult placed. ? ?TOC team following and w3ill assist with needs... ? ?Expected Discharge Plan: [place] ?Barriers to Discharge: Continued Medical Work up ? ? ?Patient Goals and CMS Choice ?  ?  ?Choice offered to / list presented to : Patient ? ?Expected Discharge Plan and Services ?Expected Discharge Plan: [place] ?  ?Discharge Planning Services: CM Consult ?Post Acute Care Choice: Durable Medical Equipment, Home Health [facility]) ?Living arrangements for the past 2 months: [place] ?                ?  ?  ?  ?  ?  ?  ?  ?  ?  ?  ? ?Prior Living Arrangements/Services ?Living arrangements for the past 2 months: [place] ?Lives with:: Spouse ?Patient language and need for interpreter reviewed:: Yes ?Do you feel safe going back to the place where you live?: Yes      ?Need for Family Participation in Patient Care: Yes (Comment) ?Care giver support system in place?: Yes (comment) ?Current home services: DME (CPAP, shower bench , rollator, cane) ?Criminal Activity/Legal Involvement Pertinent to Current Situation/Hospitalization: No - Comment as needed ? ?Activities of Daily Living ?Home Assistive Devices/Equipment: Blood pressure cuff, Walker (specify type), Wheelchair, [employer], Hearing aid, Teacher, adult education, Radio producer (specify quad or  straight) ?ADL Screening (condition at time of admission) ?Patient's cognitive ability adequate to safely complete daily activities?: Yes ?Is the patient deaf or have difficulty hearing?: No ?Does the patient have difficulty seeing, even when wearing glasses/contacts?: No ?Does the patient have difficulty concentrating, remembering, or making decisions?: Yes ?Patient able to express need for assistance with ADLs?: Yes ?Does the patient have difficulty dressing or bathing?: No ?Independently performs ADLs?: Yes (appropriate for developmental age) ?Does the patient have difficulty walking or climbing stairs?: No ?Weakness of Legs: Both ?Weakness of Arms/Hands: None ? ?Permission Sought/Granted ?  ?Permission granted to share information with : Yes, Verbal Permission Granted ?   ?   ?   ?   ? ?Emotional Assessment ?Appearance:: Appears stated age ?Attitude/Demeanor/Rapport: Engaged ?Affect (typically observed): Accepting ?Orientation: : Oriented to Self, Oriented to Place, Oriented to  Time, Oriented to Situation ?Alcohol / Substance Use: Not Applicable ?Psych Involvement: No (comment) ? ?Admission diagnosis:  Fracture [T14.8XXA] ?Closed right hip fracture ([place]) [S72.001A] ?Closed fracture of right hip, initial encounter ([place]) [S72.001A] ?Patient Active Problem List  ? Diagnosis Date Noted  ? Thrombocytopenia ([place]) 05/16/2021  ? Hyponatremia 05/16/2021  ? Abnormal thyroid function test 05/16/2021  ? Dysuria 05/16/2021  ? Closed right hip fracture ([place]) 05/15/2021  ? Opioid dependence ([place]) 05/15/2021  ? Dementia without behavioral disturbance ([place]) 05/15/2021  ? Pancreatic insufficiency 05/15/2021  ? Closed fracture of acromion 05/15/2021  ?  Macrocytic anemia 05/15/2021  ? Constipation 05/15/2021  ? Atrial fibrillation ([place]) 01/02/2021  ? 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 Permanent atrial fibrillation ([place]) 12/23/2020  ? Hallucination   ? Spell of abnormal behavior   ? AMS (altered  mental status) 11/09/2020  ? Fall 10/05/2020  ? Episode of shaking   ? Abnormal head CT   ? Generalized weakness 10/01/2020  ? Unsteady gait when walking 10/01/2020  ? AF (paroxysmal atrial fibrillation) ([place]) 10/01/2020  ? 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 Peripheral polyneuropathy 10/01/2020  ? Major depressive disorder 10/01/2020  ? OSA on CPAP 10/01/2020  ? Chronic pain syndrome 10/01/2020  ? Celiac disease 10/01/2020  ? 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 ?PCP:  [facility] ?Pharmacy:   ?[facility], [address] ?[phone] ?[place] [address] ?Phone: [phone] Fax: [phone] ? ? ? ? ?Social Determinants of Health (SDOH) Interventions ?  ? ?Readmission Risk Interventions ?No flowsheet data found. ? ? ?

## 2021-05-16 NOTE — Assessment & Plan Note (Addendum)
Appears to have resolved

## 2021-05-16 NOTE — Progress Notes (Signed)
Physical Therapy Treatment ?Patient Details ?Name: Jack Neal ?MRN: [id number] ?DOB: 04-22-1945 ?Today's Date: 05/16/2021 ? ? ?History of Present Illness Pt is a 76 y.o. male admitted 05/14/21 after falling backwards while trying to pull on his slippers, denies LOC; became hypotensive after imaging requiring transfusion. Pt sustained R intertrochanteric hip fx, R posterior acromial process fx. S/p R hip IMN 3/2. PMH includes PAF, neuropathy, pacemaker, OSA on CPAP, PTSD, depression, chronic pain syndrome. ?  ?PT Comments  ? ? Session focused on RLE therex/ROM. Pt reports after further discussion with his wife, does not feel comfortable returning straight home, is hopeful for intensive rehab before home. Pt motivated and receptive to education. Will continue to follow acutely to address established goals. ?   ?Recommendations for follow up therapy are one component of a multi-disciplinary discharge planning process, led by the attending physician.  Recommendations may be updated based on patient status, additional functional criteria and insurance authorization. ? ?Follow Up Recommendations ? Acute inpatient rehab (3hours/day) ?  ?  ?Assistance Recommended at Discharge Intermittent Supervision/Assistance  ?Patient can return home with the following A little help with walking and/or transfers;A little help with bathing/dressing/bathroom;Assistance with cooking/housework;Assist for transportation;Help with stairs or ramp for entrance ?  ?Equipment Recommendations ? None recommended by PT  ?  ?Recommendations for Other Services   ? ? ?  ?Precautions / Restrictions Precautions ?Precautions: Fall ?Restrictions ?Weight Bearing Restrictions: Yes ?RUE Weight Bearing: Weight bearing as tolerated ?RLE Weight Bearing: Weight bearing as tolerated ?Other Position/Activity Restrictions: RUE shoulder </ 90' flexion and abduction  ?  ? ?Mobility ? Bed Mobility ?Overal bed mobility: Needs Assistance ?Bed Mobility: Supine to Sit ?  ?   ?Supine to sit: Mod assist ?  ?  ?General bed mobility comments: received sitting in recliner ?  ? ?Transfers ?Overall transfer level: Needs assistance ?  ? ?  ?  ?  ? ?  ? ?Ambulation/Gait ?  ?  ?  ?  ?  ?  ?  ? ? ?Stairs ?  ?  ?  ?  ?  ? ? ?Wheelchair Mobility ?  ? ?Modified Rankin (Stroke Patients Only) ?  ? ? ?  ?Balance Overall balance assessment: Needs assistance ?Sitting-balance support: No upper extremity supported, Feet supported ?Sitting balance-Leahy Scale: Fair ?  ?  ?Standing balance support: Reliant on assistive device for balance ?Standing balance-Leahy Scale: Poor ?  ?  ?  ?  ?  ?  ?  ?  ?  ?  ?  ?  ?  ? ?  ?Cognition Arousal/Alertness: Awake/alert ?Behavior During Therapy: [facility] for tasks assessed/performed ?Overall Cognitive Status: Within Functional Limits for tasks assessed ?  ?  ?  ?  ?  ?  ?  ?  ?  ?  ?  ?  ?  ?  ?  ?  ?General Comments: verbose with some decreased attention requiring occasional redirection; pleasant and motivated ?  ?  ? ?  ?Exercises Other Exercises ?Other Exercises: Medbridge HEP handout provided (Access Code [email or web address]) - quad set, supine hip ABD, glut set, LAQ ?Other Exercises: Pt able to perform partial range LAQ and supine hip ABD (ultimately requiring AAROM for abd/add), able to perform quad and glut sets seated in recliner ? ?  ?General Comments General comments (skin integrity, edema, etc.): pt initially hopeful for home, but just got off phone with wife whose preference is for rehab. Discussed CIR vs. SNF-level therapies - pt hopeful for CIR, would be able  to tolerate intensity ?  ?  ? ?Pertinent Vitals/Pain Pain Assessment ?Pain Assessment: Faces ?Faces Pain Scale: Hurts a little bit ?Pain Location: RLE ?Pain Descriptors / Indicators: Discomfort, Sore ?Pain Intervention(s): Monitored during session, Limited activity within patient's tolerance  ? ? ?Home Living Family/patient expects to be discharged to:: Private residence ?Living Arrangements: Spouse/significant  other ?Available Help at Discharge: Family;Available 24 hours/day ?Type of Home: House ?Home Access: Level entry ?  ?  ?  ?Home Layout: One level ?Home Equipment: Rollator (4 wheels);Tub bench;Transport chair;Cane - single point ?Additional Comments: reports wife not able to physically assist with mobility, but could help some with ADLs  ?  ?Prior Function    ?  ?  ?   ? ?PT Goals (current goals can now be found in the care plan section) Acute Rehab PT Goals ?Patient Stated Goal: return home ?PT Goal Formulation: With patient ?Time For Goal Achievement: 05/30/21 ?Potential to Achieve Goals: Good ?Progress towards PT goals: Progressing toward goals ? ?  ?Frequency ? ? ? Min 5X/week ? ? ? ?  ?PT Plan Current plan remains appropriate  ? ? ?Co-evaluation   ?  ?  ?  ?  ? ?  ?AM-PAC PT "6 Clicks" Mobility   ?Outcome Measure ? Help needed turning from your back to your side while in a flat bed without using bedrails?: A Lot ?Help needed moving from lying on your back to sitting on the side of a flat bed without using bedrails?: A Lot ?Help needed moving to and from a bed to a chair (including a wheelchair)?: A Little ?Help needed standing up from a chair using your arms (e.g., wheelchair or bedside chair)?: A Lot ?Help needed to walk in hospital room?: Total ?Help needed climbing 3-5 steps with a railing? : Total ?6 Click Score: 11 ? ?  ?End of Session Equipment Utilized During Treatment: Gait belt ?Activity Tolerance: Patient tolerated treatment well ?Patient left: in chair;with call bell/phone within reach;with chair alarm set ?Nurse Communication: Mobility status ?PT Visit Diagnosis: Other abnormalities of gait and mobility (R26.89);Muscle weakness (generalized) (M62.81);Pain ?Pain - Right/Left: Right ?Pain - part of body: Leg ?  ? ? ?Time: [id number] ?PT Time Calculation (min) (ACUTE ONLY): 13 min ? ?Charges:  $Therapeutic Exercise: 8-22 mins          ?          ? ?Mabeline Caras, PT, DPT ?Acute Rehabilitation Services   ?Pager [phone] ?Office [phone] ? ?Derry Lory ?05/16/2021, 12:59 PM ? ?

## 2021-05-16 NOTE — Assessment & Plan Note (Addendum)
Platelet counts have rebounded. ?

## 2021-05-16 NOTE — Progress Notes (Signed)
NCM received  [facility] Notification ID  [id number]. ?Gae Gallop RN, BSN,CM ?

## 2021-05-16 NOTE — Evaluation (Signed)
Occupational Therapy Evaluation Patient Details Name: Jack Neal MRN: [id number] DOB: May 07, 1945 Today's Date: 05/16/2021   History of Present Illness Pt is a 76 y.o. male admitted 05/14/21 after falling backwards while trying to pull on his slippers, denies LOC; became hypotensive after imaging requiring transfusion. Pt sustained R intertrochanteric hip fx, R posterior acromial process fx. S/p R hip IMN 3/2. PMH includes PAF, neuropathy, pacemaker, OSA on CPAP, PTSD, depression, chronic pain syndrome.   Clinical Impression   Patient admitted after a mechanical fall and procedure above.  PTA he lives with his spouse who can provide supportive services, but not physical assist.  Pain and dizziness are the primary deficits.  Currently he is needing up to Mod A for basic transfers, and up to Max A for ADL completion at a seated level.  Given motivation and prior level of function, the patient would benefit from an aggressive multi disciplined approach to post acute rehab prior to returning home.  OT will continue to follow in the acute setting to maximize his functional status.       Recommendations for follow up therapy are one component of a multi-disciplinary discharge planning process, led by the attending physician.  Recommendations may be updated based on patient status, additional functional criteria and insurance authorization.   Follow Up Recommendations  Acute inpatient rehab (3hours/day)    Assistance Recommended at Discharge Frequent or constant Supervision/Assistance  Patient can return home with the following A lot of help with walking and/or transfers;A lot of help with bathing/dressing/bathroom;Assistance with cooking/housework;Assist for transportation;Help with stairs or ramp for entrance    Functional Status Assessment  Patient has had a recent decline in their functional status and demonstrates the ability to make significant improvements in function in a reasonable and  predictable amount of time.  Equipment Recommendations  None recommended by OT    Recommendations for Other Services       Precautions / Restrictions Precautions Precautions: Fall Restrictions Weight Bearing Restrictions: Yes RUE Weight Bearing: Weight bearing as tolerated RLE Weight Bearing: Weight bearing as tolerated Other Position/Activity Restrictions: RUE shoulder </ 90' flexion and abduction      Mobility Bed Mobility               General bed mobility comments: received sitting in recliner    Transfers Overall transfer level: Needs assistance Equipment used: Rolling walker (2 wheels) Transfers: Sit to/from Stand, Bed to chair/wheelchair/BSC Sit to Stand: Min assist, From elevated surface     Step pivot transfers: Mod assist            Balance Overall balance assessment: Needs assistance Sitting-balance support: No upper extremity supported, Feet supported Sitting balance-Leahy Scale: Good     Standing balance support: Reliant on assistive device for balance Standing balance-Leahy Scale: Poor                             ADL either performed or assessed with clinical judgement   ADL Overall ADL's : Needs assistance/impaired Eating/Feeding: Independent;Sitting   Grooming: Set up;Sitting   Upper Body Bathing: Set up;Sitting   Lower Body Bathing: Maximal assistance;Sitting/lateral leans   Upper Body Dressing : Minimal assistance;Sitting   Lower Body Dressing: Maximal assistance;Sitting/lateral leans   Toilet Transfer: Moderate assistance;BSC/3in1;Stand-pivot                   Vision Baseline Vision/History: 1 Wears glasses Patient Visual Report: No change from baseline  Perception Perception Perception: Within Functional Limits   Praxis Praxis Praxis: Intact    Pertinent Vitals/Pain Pain Assessment Faces Pain Scale: Hurts even more Pain Location: RLE Pain Descriptors / Indicators: Discomfort, Sore,  Guarding, Grimacing Pain Intervention(s): Monitored during session     Hand Dominance Right   Extremity/Trunk Assessment Upper Extremity Assessment Upper Extremity Assessment: Overall WFL for tasks assessed;RUE deficits/detail RUE Deficits / Details: restricted AROM RUE Sensation: WNL RUE Coordination: WNL   Lower Extremity Assessment Lower Extremity Assessment: Defer to PT evaluation RLE Deficits / Details: s/p R hip IMN; knee ext <3/5 but able to achieve partial range LAQ, hip <3/5 with expected post-op pain and weakness RLE Sensation: history of peripheral neuropathy RLE Coordination: decreased gross motor   Cervical / Trunk Assessment Cervical / Trunk Assessment: Kyphotic   Communication Communication Communication: No difficulties   Cognition Arousal/Alertness: Awake/alert Behavior During Therapy: WFL for tasks assessed/performed Overall Cognitive Status: Within Functional Limits for tasks assessed                                 General Comments: STM deficts per patient.  Recently dx with dementia     General Comments  Watch BP   Exercises     Shoulder Instructions      Home Living Family/patient expects to be discharged to:: Private residence Living Arrangements: Spouse/significant other Available Help at Discharge: Family;Available 24 hours/day Type of Home: House Home Access: Level entry     Home Layout: One level     Bathroom Shower/Tub: Chief Strategy Officer: Standard Bathroom Accessibility: Yes How Accessible: Accessible via walker Home Equipment: Rollator (4 wheels);Tub bench;Transport chair;Cane - single point   Additional Comments: reports wife not able to physically assist with mobility, but could help some with ADLs      Prior Functioning/Environment Prior Level of Function : Independent/Modified Independent             Mobility Comments: Mod indep ambulating "with my rollator 98% of the time, 1% with two  canes and the other 1% to my bathroom just holding onto the wall" - reports need for DME is not a strength issue, but a balance issue. Currently working with HHPT services. Retired Cabin crew ADLs Comments: Typically sponge bathes at sink, will use tub transfer bench 1x/wk for shower        OT Problem List: Decreased strength;Decreased range of motion;Decreased activity tolerance;Impaired balance (sitting and/or standing);Pain      OT Treatment/Interventions: Self-care/ADL training;DME and/or AE instruction;Therapeutic activities;Balance training;Patient/family education    OT Goals(Current goals can be found in the care plan section) Acute Rehab OT Goals Patient Stated Goal: Get up and be able to move OT Goal Formulation: With patient Time For Goal Achievement: 05/30/21 Potential to Achieve Goals: Good ADL Goals Pt Will Perform Grooming: with min guard assist;standing Pt Will Perform Lower Body Dressing: with min assist;with adaptive equipment;sitting/lateral leans Pt Will Transfer to Toilet: with min guard assist;stand pivot transfer;bedside commode Pt Will Perform Toileting - Clothing Manipulation and hygiene: with supervision;sitting/lateral leans  OT Frequency: Min 2X/week    Co-evaluation              AM-PAC OT "6 Clicks" Daily Activity     Outcome Measure Help from another person eating meals?: None Help from another person taking care of personal grooming?: None Help from another person toileting, which includes using toliet, bedpan, or urinal?: A Lot Help  from another person bathing (including washing, rinsing, drying)?: A Lot Help from another person to put on and taking off regular upper body clothing?: A Little Help from another person to put on and taking off regular lower body clothing?: A Lot 6 Click Score: 17   End of Session Equipment Utilized During Treatment: Rolling walker (2 wheels) Nurse Communication: Mobility status  Activity Tolerance: Patient tolerated  treatment well Patient left: in chair;with call bell/phone within reach;with chair alarm set  OT Visit Diagnosis: Unsteadiness on feet (R26.81);Muscle weakness (generalized) (M62.81);History of falling (Z91.81);Pain Pain - Right/Left: Right Pain - part of body: Leg                Time: 1350-1412 OT Time Calculation (min): 22 min Charges:  OT General Charges $OT Visit: 1 Visit OT Evaluation $OT Eval Moderate Complexity: 1 Mod  05/16/2021  RP, OTR/L  Acute Rehabilitation Services  Office:  [phone]   Suzanna Obey 05/16/2021, 2:17 PM

## 2021-05-16 NOTE — Anesthesia Postprocedure Evaluation (Signed)
Anesthesia Post Note ? ?Patient: Jack Neal ? ?Procedure(s) Performed: INTRAMEDULLARY (IM) NAIL INTERTROCHANTRIC (Right) ? ?  ? ?Patient location during evaluation: PACU ?Anesthesia Type: General ?Level of consciousness: awake and alert ?Pain management: pain level controlled ?Vital Signs Assessment: post-procedure vital signs reviewed and stable ?Respiratory status: spontaneous breathing, nonlabored ventilation, respiratory function stable and patient connected to nasal cannula oxygen ?Cardiovascular status: blood pressure returned to baseline and stable ?Postop Assessment: no apparent nausea or vomiting ?Anesthetic complications: no ? ? ?No notable events documented. ? ?Last Vitals:  ?Vitals:  ? 05/16/21 0832 05/16/21 0833  ?BP: (!) 87/52 (!) 81/52  ?Pulse:    ?Resp:    ?Temp:    ?SpO2:    ?  ?Last Pain:  ?Vitals:  ? 05/16/21 0742  ?TempSrc: Oral  ?PainSc:   ? ? ?  ?  ?  ?  ?  ?  ? ?[name] ? ? ? ? ?

## 2021-05-16 NOTE — Assessment & Plan Note (Addendum)
TSH was checked due to constipation but noted to be 0.313.  Likely sick euthyroid.  Will recommend that this be rechecked in a few weeks.  No signs or symptoms of hyperthyroidism. ?

## 2021-05-16 NOTE — Progress Notes (Addendum)
Initial Nutrition Assessment ? ?DOCUMENTATION CODES:  ? ?Non-severe (moderate) malnutrition in context of chronic illness ? ?INTERVENTION:  ?- -1 packet Juven BID, each packet provides 95 calories, 2.5 grams of protein (collagen), and 9.8 grams of carbohydrate (3 grams sugar); also contains 7 grams of L-arginine and L-glutamine, 300 mg vitamin C, 15 mg vitamin E, 1.2 mcg vitamin B-12, 9.5 mg zinc, 200 mg calcium, and 1.5 g  Calcium Beta-hydroxy-Beta-methylbutyrate to support wound healing ? ?- Calcium 500 mg TID given h/o gastric bypass  ? ?- Continue bariatric vitamins per home regimen  ? ?- Continue MVI with minerals daily ? ?- HS snack  ? ?- Encourage PO intake  ? ? ?NUTRITION DIAGNOSIS:  ? ?Moderate Malnutrition related to chronic illness (h/o of gastric bypass surgery) as evidenced by moderate fat depletion, severe muscle depletion. ? ? ?GOAL:  ? ?Patient will meet greater than or equal to 90% of their needs ? ? ?MONITOR:  ? ?PO intake, Supplement acceptance, Labs, Weight trends ? ?REASON FOR ASSESSMENT:  ? ?Consult ?Assessment of nutrition requirement/status, Hip fracture protocol ? ?ASSESSMENT:  ? ?Pt is a 76 y.o.male with medical history significant for atrial fibrillation, GERD, depression, h/o of gastric bypass, PTSD, peripheral neuropathy, celiac disease, obstructive sleep apnea on CPAP, s/p pacemaker, s/p Watchman device who presented to the ED due to accidental mechanical fall at home and admitted for further management of closed right hip fracture. ? ?05/15/21 - intramedullary nail intertrochantric surgery to right femoral fracture  ? ?Pt is currently on a regular diet with meal completion of 100% for breakfast today.   ? ?Met with pt at bedside. Pt reports that for breakfast today he ate all of his eggs, grits, fruit cup, and coffee. Pt reports that he has had a decreased appetite for the past 6 or 7 years and relates it back to his gastric bypass surgery that he had back in 2006. Pt reports that he  usually is not hungry, but understands the importance of eating consistently throughout the day because he states that he is in a constant state of malnutrition due to gastric bypass. Pt states that he works closely with his GI doctor and supplements vitamins and minerals appropriately due to his h/o gastric bypass. Pt states that he is aware that he does not over-supplement, but takes supplements at home to make sure he is maintaining normal vitamin levels. Prior to admission, pt reports that he eats small, frequent meals every 2-3 hours. He states that he eats high calorie dense foods such as [place] jerky stick and gluten-free cream of chicken soup. Pt reports that for breakfast he typically has two small pancakes, sausage patty and crystal light with a scoop of plant-based protein powder. Pt reports that he lives at home with his wife and that they typically cook at home. Per pt, pt uses a rollator walker or two canes to ambulate at baseline.  ? ?Discussed with pt the importance of good nutrition and adequate PO intake for post-op healing. Encouraged pt to continue to eat small, frequent meals throughout the day and high-calorie, high-protein foods. Pt is not interested in Ensure or Boost Breeze supplements at this time, but pt is agreeable to taking Juven BID to aid in post-op healing. Dietetic intern to provide an HS snack for pt to aid in caloric and protein intake.  ? ?Weight history reviewed. Pt's current body weight is 77.1 kg (170#). Pt has experienced a 3% weight loss within the last 3.5 months which is  not significant for time frame. Per pt, pt's UBW is 170 pounds and pt states that his weight has been stable for the last two years.  ? ?Medications reviewed and include: vitamin C, vitamin D3, ferrous sulfate, folic acid, creon 60,737 units TID, MVI, miralax, senokot-s, vitamin B12, zinc sulfate  ? ?Labs reviewed and include: Na: 130 (low), Corrected calcium: 8.1 (low), Iron: 20 (low), Thiamine: 222.8  (high), hemoglobin: 7.3 (low), vitamin D: 51.15  ? ?Pt's iron is low and pt currently receiving ferrous sulfate 325 mg every MWF. Hypocalcemia present. Recommend calcium supplementation 500 mg TID.  ? ? ?NUTRITION - FOCUSED PHYSICAL EXAM: ? ?Flowsheet Row Most Recent Value  ?Orbital Region Moderate depletion  ?Upper Arm Region Moderate depletion  ?Thoracic and Lumbar Region No depletion  ?Buccal Region Moderate depletion  ?Temple Region Mild depletion  ?Clavicle Bone Region Severe depletion  ?Clavicle and Acromion Bone Region Severe depletion  ?Scapular Bone Region Severe depletion  ?Dorsal Hand Mild depletion  ?Patellar Region Mild depletion  ?Anterior Thigh Region Mild depletion  ?Posterior Calf Region Unable to assess  ?Edema (RD Assessment) Moderate  ?Hair Reviewed  ?Eyes Reviewed  ?Mouth Reviewed  ?Skin Reviewed  ?Nails Reviewed  ? ?  ? ? ?Diet Order:   ?Diet Order   ? ?       ?  Diet regular Room service appropriate? Yes; Fluid consistency: Thin  Diet effective now       ?  ? ?  ?  ? ?  ? ? ?EDUCATION NEEDS:  ? ?Education needs have been addressed ? ?Skin:  Skin Assessment: Skin Integrity Issues: ?Skin Integrity Issues:: Incisions ?Incisions: (closed); rigth hip ? ?Last BM:  3/1 per pt report ? ?Height:  ? ?Ht Readings from Last 1 Encounters:  ?05/14/21 $RemoveBe'5\' 11"'dtKnhWEXJ$  (1.803 m)  ? ? ?Weight:  ? ?Wt Readings from Last 1 Encounters:  ?05/14/21 77.1 kg  ? ? ?BMI:  Body mass index is 23.71 kg/m?. ? ?Estimated Nutritional Needs:  ? ?Kcal:  2000 - 2200 ? ?Protein:  100 - 115 grams ? ?Fluid:  > 2 L ? ? ? ?Maryruth Hancock, Dietetic Intern ?05/16/2021 3:59 PM ?

## 2021-05-16 NOTE — Plan of Care (Signed)

## 2021-05-17 LAB — CBC
HCT: 22 % — ABNORMAL LOW (ref 39.0–52.0)
Hemoglobin: 7.5 g/dL — ABNORMAL LOW (ref 13.0–17.0)
MCH: 32.2 pg (ref 26.0–34.0)
MCHC: 34.1 g/dL (ref 30.0–36.0)
MCV: 94.4 fL (ref 80.0–100.0)
Platelets: 94 10*3/uL — ABNORMAL LOW (ref 150–400)
RBC: 2.33 MIL/uL — ABNORMAL LOW (ref 4.22–5.81)
RDW: 15.8 % — ABNORMAL HIGH (ref 11.5–15.5)
WBC: 7.8 10*3/uL (ref 4.0–10.5)
nRBC: 0 % (ref 0.0–0.2)

## 2021-05-17 LAB — BASIC METABOLIC PANEL
Anion gap: 6 (ref 5–15)
BUN: 19 mg/dL (ref 8–23)
CO2: 28 mmol/L (ref 22–32)
Calcium: 8.1 mg/dL — ABNORMAL LOW (ref 8.9–10.3)
Chloride: 103 mmol/L (ref 98–111)
Creatinine, Ser: 0.71 mg/dL (ref 0.61–1.24)
GFR, Estimated: >60 mL/min (ref >60)
Glucose, Bld: 106 mg/dL — ABNORMAL HIGH (ref 70–99)
Potassium: 4 mmol/L (ref 3.5–5.1)
Sodium: 137 mmol/L (ref 135–145)

## 2021-05-17 LAB — URINE CULTURE: Culture: <10000 — AB

## 2021-05-17 LAB — PREPARE RBC (CROSSMATCH)

## 2021-05-17 MED ORDER — SODIUM CHLORIDE 0.9% IV SOLUTION: Freq: Once | INTRAVENOUS | Status: AC

## 2021-05-17 MED ORDER — POLYETHYLENE GLYCOL 3350 17 G PO PACK
17.0000 g | PACK | Freq: Two times a day (BID) | ORAL | Status: DC
  Administered 2021-05-17 – 2021-05-19 (×4): 17 g via ORAL
  Filled 2021-05-17 (×4): qty 1

## 2021-05-17 MED ORDER — BISACODYL 5 MG PO TBEC
10.0000 mg | DELAYED_RELEASE_TABLET | Freq: Once | ORAL | Status: AC
  Administered 2021-05-17: 10 mg via ORAL
  Filled 2021-05-17: qty 2

## 2021-05-17 NOTE — Progress Notes (Deleted)
Mobility Specialist Progress Note: ? ? 05/17/21 1538  ?Mobility  ?Activity Ambulated with assistance in room  ?Level of Assistance Moderate assist, patient does 50-74%  ?Assistive Device Front wheel walker  ?Distance Ambulated (ft) 28 ft  ?Activity Response Tolerated fair  ?$Mobility charge 1 Mobility  ? ?Pt received in chair willing to participate in mobility. Complaints of 7/10 R knee pain. Left in chair with call bell in reach and all needs met.  ? ?Jack Neal ?Mobility Specialist ?Primary Phone [phone] ? ?

## 2021-05-17 NOTE — Progress Notes (Signed)
[facility] PROGRESS NOTE   Jack Neal [id number] DOB: 06/30/1945 DOA: 05/14/2021  2 DOS: the patient was seen and examined on 05/17/2021  PCP: [facility]  Brief History and Hospital Course:  76 y.o. Caucasian male with medical history significant for atrial fibrillation, depression, GERD, PTSD, peripheral neuropathy, obstructive sleep apnea on CPAP, status post pacemaker, status post Watchman device, who presented to the ER after sustaining a mechanical fall.  He fell on his right side.  Came in with the right hip and shoulder pain.  Found to have right hip fracture along with a fracture of the right acromion.  He was hospitalized for further management.  Patient underwent surgery on 3/2.  Had drop in hemoglobin requiring blood transfusion.  Consultants: Orthopedics.  Trauma surgery.  Procedures: None yet    Subjective: Patient denies any chest pain shortness of breath.  Experiences pain in the right thigh area especially with movement but none at rest.  Denies any dizziness lightheadedness.     Assessment/Plan:   * Closed right hip fracture [facility]) Patient underwent ORIF on 3/2.  Pain seems to be reasonably well controlled.  Osteopenia suggested on imaging studies.  Vitamin D level is 51.15.   Seen by physical therapy.  Inpatient rehabilitation was recommended.  Closed fracture of acromion Management per orthopedics.  Looks like this is being treated conservatively.  Weightbearing as tolerated.  Permanent atrial fibrillation ([place]) He is status post PPM and Watchman left atrial appendage occlusive device.  Watchman device was placed on October 2022.  He was on Eliquis and aspirin for 45 days following which he has been on aspirin and Plavix.  To continue until April of this year.   Discussed with cardiology who are okay with holding the antiplatelet agents if necessary and to resume as soon as it safe to do so.  Patient was continued on aspirin.   Continue to  hold Plavix till hemoglobin stabilizes.  Dementia without behavioral disturbance ([place]) We will continue [place] [facility] and Namenda..  Stable.  Thrombocytopenia ([name]) Platelet count is low but stable today.  Will not make any therapeutic changes.  Will monitor trends.    Macrocytic anemia Acute blood loss anemia  Patient had a drop in his hemoglobin yesterday.  He was transfused 1 unit of PRBC.  Hemoglobin has not improved much.  Will transfuse additional unit since his blood pressures are soft.   Anemia panel reviewed.  No clear-cut deficiencies identified.  Hyponatremia Continue to monitor for now.  Pancreatic insufficiency We will continue pancrelipase.  Opioid dependence ([place]) The patient is on Suboxone chronically.  This is being continued.  Constipation Moderate stool burden noted on CT scan.  Laxatives and stool softeners.  May need suppository/enema.    OSA on CPAP We will continue CPAP nightly.  Abnormal thyroid function test TSH was checked due to constipation but noted to be 0.313.  Likely sick euthyroid.  Will recommend that this be rechecked in a few weeks.  No signs or symptoms of hyperthyroidism.  Dysuria UA noted to be normal.  No indication for antibiotics.  Continue to monitor for now.     DVT Prophylaxis: On Lovenox twice a day per orthopedics Code Status: DNR Family Communication: Discussed with patient Disposition Plan: Inpatient rehabilitation hopefully by Monday or Tuesday  Status is: Inpatient Remains inpatient appropriate because: Right hip fracture      Medications: Scheduled:  acidophilus  1 capsule Oral Daily   vitamin C  250 mg Oral BID  aspirin EC  81 mg Oral Daily   buprenorphine-naloxone  1 tablet Sublingual Daily   calcium carbonate  1 tablet Oral TID WC   cholecalciferol  2,000 Units Oral BID   doxepin  10 mg Oral QHS   DULoxetine  60 mg Oral Daily   And   DULoxetine  30 mg Oral QHS   enoxaparin (LOVENOX) injection  30 mg  Subcutaneous Q12H   ferrous sulfate  325 mg Oral Q M,W,F   folic acid  1 mg Oral Daily   galantamine  16 mg Oral QHS   hydrOXYzine  20 mg Oral QHS   lipase/protease/amylase  36,000 Units Oral TID WC   memantine  10 mg Oral Daily   And   memantine  20 mg Oral QHS   multivitamin with minerals  1 tablet Oral Daily   nutrition supplement (JUVEN)  1 packet Oral BID BM   polyethylene glycol  17 g Oral Daily   senna-docusate  2 tablet Oral BID   sodium chloride  1 spray Each Nare QHS   vitamin B-12  50 mcg Oral Daily   zinc sulfate  220 mg Oral Daily   Continuous:  sodium chloride 75 mL/hr at 05/16/21 1833   methocarbamol (ROBAXIN) IV     [id number], bisacodyl, calcium carbonate, diclofenac Sodium, fluticasone, HYDROcodone-acetaminophen, HYDROcodone-acetaminophen, magnesium hydroxide, menthol-cetylpyridinium **OR** phenol, methocarbamol **OR** methocarbamol (ROBAXIN) IV, metoCLOPramide **OR** metoCLOPramide (REGLAN) injection, morphine injection, ondansetron **OR** ondansetron (ZOFRAN) IV, polyvinyl alcohol, simethicone  Antibiotics: Anti-infectives (From admission, onward)    Start     Dose/Rate Route Frequency Ordered Stop   05/15/21 2000  ceFAZolin (ANCEF) IVPB 2g/100 mL premix        2 g 200 mL/hr over 30 Minutes Intravenous Every 6 hours 05/15/21 1814 05/16/21 0846   05/15/21 1534  vancomycin (VANCOCIN) powder  Status:  Discontinued          As needed 05/15/21 1535 05/15/21 1646   05/15/21 1350  ceFAZolin (ANCEF) 2-4 GM/100ML-% IVPB       Note to Pharmacy: Roosvelt Maser N: cabinet override      05/15/21 1350 05/16/21 0159       Objective:  Vital Signs  Vitals:   05/16/21 1506 05/16/21 2000 05/17/21 0431 05/17/21 0733  BP: 103/60 117/90 94/80 (!) 109/58  Pulse: 87 85 71 81  Resp:  16 17 10   Temp:  98.3 F (36.8 C)  98.2 F (36.8 C)  TempSrc:  Oral  Oral  SpO2: 100% 98% 96% 94%  Weight:      Height:        Intake/Output Summary (Last 24 hours) at 05/17/2021  0905 Last data filed at 05/17/2021 0437 Gross per 24 hour  Intake 2404.52 ml  Output 1950 ml  Net 454.52 ml    Filed Weights   05/14/21 2339  Weight: 77.1 kg    General appearance: Awake alert.  In no distress Resp: Clear to auscultation bilaterally.  Normal effort Cardio: S1-S2 is normal regular.  No S3-S4.  No rubs murmurs or bruit GI: Abdomen is soft.  Nontender nondistended.  Bowel sounds are present normal.  No masses organomegaly Extremities: Swelling of the right thigh noted.  No active bleeding.  Good peripheral pulses Neurologic: Alert and oriented x3.  No focal neurological deficits.      Lab Results:  Data Reviewed: I have personally reviewed labs and imaging study reports  CBC: Recent Labs  Lab 05/14/21 2239 05/15/21 [id number] 05/16/21 0211 05/16/21 1456 05/17/21 [id number]  WBC 5.2 6.2 7.2  --  7.8  HGB 11.8* 10.1* 7.3* 7.3* 7.5*  HCT 37.0* 32.3* 22.0* 22.3* 22.0*  MCV 103.1* 100.0 99.1  --  94.4  PLT 124* 110* 98*  --  94*     Basic Metabolic Panel: Recent Labs  Lab 05/14/21 2239 05/15/21 0822 05/16/21 0211 05/17/21 0224  NA 139 139 130* 137  K 4.2 3.8 4.1 4.0  CL 103 105 100 103  CO2 29 27 23 28   GLUCOSE 98 114* 178* 106*  BUN 13 14 18 19   CREATININE 0.82 0.60* 0.82 0.71  CALCIUM 8.5* 8.2* 7.6* 8.1*     GFR: Estimated Creatinine Clearance: 86.3 mL/min (by C-G formula based on SCr of 0.71 mg/dL).  Liver Function Tests: Recent Labs  Lab 05/14/21 2239  AST 20  ALT 12  ALKPHOS 143*  BILITOT 1.2  PROT 6.0*  ALBUMIN 3.4*      Coagulation Profile: Recent Labs  Lab 05/14/21 2239  INR 1.2      Recent Results (from the past 240 hour(s))  Resp Panel by RT-PCR (Flu A&B, Covid) Nasopharyngeal Swab     Status: None   Collection Time: 05/14/21 11:39 PM   Specimen: Nasopharyngeal Swab; Nasopharyngeal(NP) swabs in vial transport medium  Result Value Ref Range Status   SARS Coronavirus 2 by RT PCR NEGATIVE NEGATIVE Final    Comment:  (NOTE) SARS-CoV-2 target nucleic acids are NOT DETECTED.  The SARS-CoV-2 RNA is generally detectable in upper respiratory specimens during the acute phase of infection. The lowest concentration of SARS-CoV-2 viral copies this assay can detect is 138 copies/mL. A negative result does not preclude SARS-Cov-2 infection and should not be used as the sole basis for treatment or other patient management decisions. A negative result may occur with  improper specimen collection/handling, submission of specimen other than nasopharyngeal swab, presence of viral mutation(s) within the areas targeted by this assay, and inadequate number of viral copies(<138 copies/mL). A negative result must be combined with clinical observations, patient history, and epidemiological information. The expected result is Negative.  Fact Sheet for Patients:  EntrepreneurPulse.com.au  Fact Sheet for Healthcare Providers:  IncredibleEmployment.be  This test is no t yet approved or cleared by the [place] FDA and  has been authorized for detection and/or diagnosis of SARS-CoV-2 by FDA under an Emergency Use Authorization (EUA). This EUA will remain  in effect (meaning this test can be used) for the duration of the COVID-19 declaration under Section 564(b)(1) of the Act, 21 U.S.C.section 360bbb-3(b)(1), unless the authorization is terminated  or revoked sooner.       Influenza A by PCR NEGATIVE NEGATIVE Final   Influenza B by PCR NEGATIVE NEGATIVE Final    Comment: (NOTE) The Xpert Xpress SARS-CoV-2/FLU/RSV plus assay is intended as an aid in the diagnosis of influenza from Nasopharyngeal swab specimens and should not be used as a sole basis for treatment. Nasal washings and aspirates are unacceptable for Xpert Xpress SARS-CoV-2/FLU/RSV testing.  Fact Sheet for Patients: EntrepreneurPulse.com.au  Fact Sheet for Healthcare  Providers: IncredibleEmployment.be  This test is not yet approved or cleared by the [place] FDA and has been authorized for detection and/or diagnosis of SARS-CoV-2 by FDA under an Emergency Use Authorization (EUA). This EUA will remain in effect (meaning this test can be used) for the duration of the COVID-19 declaration under Section 564(b)(1) of the Act, 21 U.S.C. section 360bbb-3(b)(1), unless the authorization is terminated or revoked.  Performed at [facility],  [address] [place], [place] [address]   Surgical pcr screen     Status: None   Collection Time: 05/15/21  1:48 PM   Specimen: Nasal Mucosa; Nasal Swab  Result Value Ref Range Status   MRSA, PCR NEGATIVE NEGATIVE Final   Staphylococcus aureus NEGATIVE NEGATIVE Final    Comment: (NOTE) The Xpert SA Assay (FDA approved for NASAL specimens in patients 89 years of age and older), is one component of a comprehensive surveillance program. It is not intended to diagnose infection nor to guide or monitor treatment. Performed at [facility], [place] [address] [place], [place] [address]   Urine Culture     Status: Abnormal   Collection Time: 05/16/21 10:23 AM   Specimen: Urine, Clean Catch  Result Value Ref Range Status   Specimen Description URINE, CLEAN CATCH  Final   Special Requests NONE  Final   Culture (A)  Final    <10,000 COLONIES/mL INSIGNIFICANT GROWTH Performed at [facility], [place] [address] [place], [place] [address]    Report Status 05/17/2021 FINAL  Final       Radiology Studies: DG C-Arm 1-60 Min-No Report  Result Date: 05/15/2021 Fluoroscopy was utilized by the requesting physician.  No radiographic interpretation.   DG C-Arm 1-60 Min-No Report  Result Date: 05/15/2021 Fluoroscopy was utilized by the requesting physician.  No radiographic interpretation.   DG FEMUR, MIN 2 VIEWS RIGHT  Result Date: 05/15/2021 CLINICAL DATA:  Elective surgery. EXAM:  RIGHT FEMUR 2 VIEWS COMPARISON:  Right femur radiographs earlier today FLUOROSCOPY: Fluoroscopy Time: 84 seconds Radiation Exposure Index: 11.52 mGy FINDINGS: Four intraoperative spot fluoroscopic images are provided and demonstrate placement of an intramedullary nail and proximal and distal interlocking screws for treatment of the previously shown intertrochanteric fracture which now demonstrates improved alignment. IMPRESSION: Intraoperative images during ORIF of a right femoral intertrochanteric fracture. Electronically Signed   By: Logan Bores M.D.   On: 05/15/2021 16:31   DG FEMUR PORT, MIN 2 VIEWS RIGHT  Result Date: 05/15/2021 CLINICAL DATA:  Postop femur fracture ORIF EXAM: RIGHT FEMUR PORTABLE 2 VIEW COMPARISON:  05/15/2021 FINDINGS: Intertrochanteric fracture on the right in satisfactory alignment. Compression screw and locking rod in the right femur in good position. Right hip joint normal. IMPRESSION: Satisfactory ORIF right intertrochanteric fracture. No immediate complication. Electronically Signed   By: Franchot Gallo M.D.   On: 05/15/2021 19:05       LOS: 2 days   [facility] Pager on [email or web address]  05/17/2021, 9:05 AM

## 2021-05-17 NOTE — Progress Notes (Signed)
Physical Therapy Treatment ?Patient Details ?Name: Jack Neal ?MRN: [id number] ?DOB: 1945/03/22 ?Today's Date: 05/17/2021 ? ? ?History of Present Illness Pt is a 76 y.o. male admitted 05/14/21 after falling backwards while trying to pull on his slippers, denies LOC; became hypotensive after imaging requiring transfusion. Pt sustained R intertrochanteric hip fx, R posterior acromial process fx. S/p R hip IMN 3/2. PMH includes PAF, neuropathy, pacemaker, OSA on CPAP, PTSD, depression, chronic pain syndrome. ? ?  ?PT Comments  ? ? The pt was eager to progress mobility today, and was able to complete multiple transfers in session to first [facility], then to recliner. He remains limited by pain in RLE and is therefore unable to progress to steps/gait at this time, but is able to complete heel-toe pivotal movements with LLE to move from bed-BSC and BSC-chair. Will continue to benefit from skilled PT to progress gait training and exercises for ROM and strength in RLE. He remains highly motivated and works through high levels of pain well. Continue to recommend AIR at d/c to maximize functional recovery and return to independence.  ?   ?Recommendations for follow up therapy are one component of a multi-disciplinary discharge planning process, led by the attending physician.  Recommendations may be updated based on patient status, additional functional criteria and insurance authorization. ? ?Follow Up Recommendations ? Acute inpatient rehab (3hours/day) ?  ?  ?Assistance Recommended at Discharge Intermittent Supervision/Assistance  ?Patient can return home with the following A little help with walking and/or transfers;A little help with bathing/dressing/bathroom;Assistance with cooking/housework;Assist for transportation;Help with stairs or ramp for entrance ?  ?Equipment Recommendations ? None recommended by PT  ?  ?Recommendations for Other Services   ? ? ?  ?Precautions / Restrictions Precautions ?Precautions:  Fall ?Restrictions ?Weight Bearing Restrictions: Yes ?RUE Weight Bearing: Weight bearing as tolerated ?RLE Weight Bearing: Weight bearing as tolerated ?Other Position/Activity Restrictions: RUE shoulder </ 90' flexion and abduction  ?  ? ?Mobility ? Bed Mobility ?Overal bed mobility: Needs Assistance ?Bed Mobility: Supine to Sit ?  ?  ?Supine to sit: Mod assist ?  ?  ?General bed mobility comments: modA to move RLE and to complete trunk movement ?  ? ?Transfers ?Overall transfer level: Needs assistance ?Equipment used: Rolling walker (2 wheels) ?Transfers: Sit to/from Stand, Bed to chair/wheelchair/BSC ?Sit to Stand: Min assist, From elevated surface, Mod assist ?  ?Step pivot transfers: Mod assist ?  ?  ?  ?General transfer comment: increased time and effort to power up, pt with significant pain but completing with only minA. small steps to [facility] with minimal wt throuh RLE. modA to rise from lower BSC ?  ? ?Ambulation/Gait ?  ?  ?  ?  ?  ?  ?  ?General Gait Details: pt unable to wt bear on R hip enough to step with LLE, dependent on heel-toe pivotal movements at this time ? ?  ?Balance Overall balance assessment: Needs assistance ?Sitting-balance support: No upper extremity supported, Feet supported ?Sitting balance-Leahy Scale: Good ?  ?  ?Standing balance support: Reliant on assistive device for balance ?Standing balance-Leahy Scale: Poor ?Standing balance comment: dependent on BUE support ?  ?  ?  ?  ?  ?  ?  ?  ?  ?  ?  ?  ? ?  ?Cognition Arousal/Alertness: Awake/alert ?Behavior During Therapy: [facility] for tasks assessed/performed ?Overall Cognitive Status: Within Functional Limits for tasks assessed ?  ?  ?  ?  ?  ?  ?  ?  ?  ?  ?  ?  ?  ?  ?  ?  ?  General Comments: able to state needs and problem solve safe movement. highly motivated ?  ?  ? ?  ?Exercises   ? ?  ?General Comments General comments (skin integrity, edema, etc.): VSS, pt with large BM during session ?  ?  ? ?Pertinent Vitals/Pain Pain Assessment ?Pain  Assessment: Faces ?Faces Pain Scale: Hurts even more ?Pain Location: RLE with any movement ?Pain Descriptors / Indicators: Discomfort, Sore, Guarding, Grimacing ?Pain Intervention(s): Limited activity within patient's tolerance, Monitored during session, Repositioned  ? ? ? ?PT Goals (current goals can now be found in the care plan section) Acute Rehab PT Goals ?Patient Stated Goal: return home ?PT Goal Formulation: With patient ?Time For Goal Achievement: 05/30/21 ?Potential to Achieve Goals: Good ?Progress towards PT goals: Progressing toward goals ? ?  ?Frequency ? ? ? Min 5X/week ? ? ? ?  ?PT Plan Current plan remains appropriate  ? ? ?Co-evaluation   ?  ?  ?  ?  ? ?  ?AM-PAC PT "6 Clicks" Mobility   ?Outcome Measure ? Help needed turning from your back to your side while in a flat bed without using bedrails?: A Lot ?Help needed moving from lying on your back to sitting on the side of a flat bed without using bedrails?: A Lot ?Help needed moving to and from a bed to a chair (including a wheelchair)?: A Little ?Help needed standing up from a chair using your arms (e.g., wheelchair or bedside chair)?: A Lot ?Help needed to walk in hospital room?: Total ?Help needed climbing 3-5 steps with a railing? : Total ?6 Click Score: 11 ? ?  ?End of Session Equipment Utilized During Treatment: Gait belt ?Activity Tolerance: Patient tolerated treatment well ?Patient left: in chair;with call bell/phone within reach;with chair alarm set ?Nurse Communication: Mobility status ?PT Visit Diagnosis: Other abnormalities of gait and mobility (R26.89);Muscle weakness (generalized) (M62.81);Pain ?Pain - Right/Left: Right ?Pain - part of body: Leg ?  ? ? ?Time: 3846-6599 (plus 1022 -1039 (17 min)) ?PT Time Calculation (min) (ACUTE ONLY): 18 min + 17 min = 34 min ? ?Charges:  $Therapeutic Activity: 23-37 mins          ?          ? ?Vickki Muff, PT, DPT  ? ?Acute Rehabilitation Department ?Pager #: [phone] - [phone] ? ? ?Ronnie Derby ?05/17/2021, 11:31 AM ? ?

## 2021-05-17 NOTE — Plan of Care (Signed)
  Problem: Nutrition: Goal: Adequate nutrition will be maintained Outcome: Progressing   Problem: Coping: Goal: Level of anxiety will decrease Outcome: Progressing   Problem: Elimination: Goal: Will not experience complications related to bowel motility Outcome: Progressing Goal: Will not experience complications related to urinary retention Outcome: Progressing   Problem: Pain Managment: Goal: General experience of comfort will improve Outcome: Progressing   

## 2021-05-18 LAB — CBC
HCT: 23 % — ABNORMAL LOW (ref 39.0–52.0)
Hemoglobin: 7.9 g/dL — ABNORMAL LOW (ref 13.0–17.0)
MCH: 32.5 pg (ref 26.0–34.0)
MCHC: 34.3 g/dL (ref 30.0–36.0)
MCV: 94.7 fL (ref 80.0–100.0)
Platelets: 94 10*3/uL — ABNORMAL LOW (ref 150–400)
RBC: 2.43 MIL/uL — ABNORMAL LOW (ref 4.22–5.81)
RDW: 16.4 % — ABNORMAL HIGH (ref 11.5–15.5)
WBC: 5.6 10*3/uL (ref 4.0–10.5)
nRBC: 0 % (ref 0.0–0.2)

## 2021-05-18 LAB — BASIC METABOLIC PANEL
Anion gap: 7 (ref 5–15)
BUN: 22 mg/dL (ref 8–23)
CO2: 33 mmol/L — ABNORMAL HIGH (ref 22–32)
Calcium: 8.2 mg/dL — ABNORMAL LOW (ref 8.9–10.3)
Chloride: 98 mmol/L (ref 98–111)
Creatinine, Ser: 0.67 mg/dL (ref 0.61–1.24)
GFR, Estimated: >60 mL/min (ref >60)
Glucose, Bld: 95 mg/dL (ref 70–99)
Potassium: 3.8 mmol/L (ref 3.5–5.1)
Sodium: 138 mmol/L (ref 135–145)

## 2021-05-18 MED ORDER — HYDROCODONE-ACETAMINOPHEN 5-325 MG PO TABS: 1.0000 | ORAL_TABLET | Freq: Four times a day (QID) | ORAL | 0 refills | Status: DC | PRN

## 2021-05-18 MED ORDER — ENOXAPARIN SODIUM 30 MG/0.3ML IJ SOSY: 30.0000 mg | PREFILLED_SYRINGE | Freq: Two times a day (BID) | INTRAMUSCULAR | 0 refills | Status: DC

## 2021-05-18 MED ORDER — BUPRENORPHINE HCL-NALOXONE HCL 2-0.5 MG SL SUBL
1.0000 | SUBLINGUAL_TABLET | Freq: Every day | SUBLINGUAL | Status: DC
  Administered 2021-05-19 – 2021-05-20 (×2): 1 via SUBLINGUAL
  Filled 2021-05-18 (×2): qty 1

## 2021-05-18 MED ORDER — BISACODYL 10 MG RE SUPP
10.0000 mg | Freq: Once | RECTAL | Status: DC
  Filled 2021-05-18: qty 1

## 2021-05-18 MED ORDER — PANTOPRAZOLE SODIUM 40 MG PO TBEC
40.0000 mg | DELAYED_RELEASE_TABLET | Freq: Every day | ORAL | Status: DC
Start: 2021-05-18 — End: 2021-05-20
  Administered 2021-05-18 – 2021-05-19 (×2): 40 mg via ORAL
  Filled 2021-05-18 (×2): qty 1

## 2021-05-18 NOTE — PMR Pre-admission (Signed)
PMR Admission Coordinator Pre-Admission Assessment  Patient: Jack Neal is an 76 y.o., male MRN: [id number] DOB: [birth year] Height: $RemoveBeforeDE'5\' 11"'FdehWgpsgwsURno$  (180.3 cm) Weight: 77.1 kg  Insurance Information HMO:     PPO:      PCP:      IPA:      80/20:      OTHER:  PRIMARY: [facility]      Policy#: [id number]      Subscriber: Pt  CM Name: Sherlynn Carbon       Phone#: [phone]     Fax#: [phone] I received approval for 30 days from [facility] at the [place] on 06/18/99 Pre-Cert#:       Employer:  Benefits:  Phone #:      Name:  Irene Shipper Date: 08/14/2017 - still active Deductible: $0 OOP Max: $0 As long as Referral from ER was received the following will be true:  CIR: 100% coverage, auth required SNF: 100% coverage, auth required Outpatient:  100% coverage, auth required Home Health:  100% coverage, auth required DME: 100% coverage, auth required Providers: In network   SECONDARY: Medicare A and B       Policy#: [id number]     Phone#:   Financial Counselor:       Phone#:   The Actuary for patients in Inpatient Rehabilitation Facilities with attached Privacy Act [place] Records was provided and verbally reviewed with: Patient  Emergency Contact Information Contact Information     Name Relation Home Work Mobile   [place] Spouse [phone]  [phone]   Jack Neal Daughter [phone]       Current Medical History  Patient Admitting Diagnosis: Hip fx  History of Present Illness: Jack Neal is a 76 year old right-handed male with history of PAF/pacemaker maintained on aspirin and Plavix, 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  Per chart review patient lives with spouse.  1 level home.  Wife can provide very little physical assistance.  Patient was independent with a rollator prior to admission.  He does not drive.  Presented 05/14/2021 after mechanical fall without loss of consciousness landing on his right side.  Cranial CT scan negative.  CT cervical spine negative.  CT chest abdomen pelvis showed acute minimally displaced fracture of the right acromion.  Acute comminuted angulated intertrochanteric fracture of the right hip with moderate surrounding intramuscular and interstitial hemorrhage.  Patient underwent open treatment of right intertrochanteric femur fracture with intramedullary implant with interlocking screws 05/15/2021 per Dr. Mable Fill.  Patient is weightbearing as tolerated.  Conservative care of right acromion fracture limited abduction and forward flexion to 90 degrees due to fracture.  Placed on Lovenox for DVT prophylaxis.  He remains on aspirin and Plavix as prior to admission.  Acute blood loss anemia hemoglobin 8.2.  Transfused 1 unit packed red blood cells 3/4.  Therapy evaluations completed due to patient decreased functional mobility was admitted for a comprehensive rehab program.    Patient's medical [facility] from [facility] has been reviewed by the rehabilitation admission coordinator and physician.  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 Yes  Family History   family history includes Brain cancer in his sister; Heart attack in his mother; Ulcers in his father.  Current Medications  Current Facility-Administered Medications:    acetaminophen (TYLENOL) tablet 325-650 mg, 325-650 mg, Oral, Q6H PRN, Georgeanna Harrison, MD, 650 mg at 05/18/21 1002   acidophilus (RISAQUAD) capsule 1 capsule, 1 capsule, Oral, Daily, Mansy, Jan A, MD, 1 capsule at 05/18/21 1001   ascorbic acid (VITAMIN C) tablet 250 mg, 250 mg, Oral, BID, Mansy, Jan A, MD, 250 mg at 05/17/21 2110   aspirin EC tablet 81 mg, 81 mg, Oral, Daily, Bonnielee Haff, MD, 81 mg at 05/18/21 1004   bisacodyl (DULCOLAX) suppository 10 mg, 10 mg, Rectal, Daily PRN, Bonnielee Haff, MD   bisacodyl (DULCOLAX) suppository 10 mg, 10 mg, Rectal, Once, Bonnielee Haff, MD   buprenorphine-naloxone (SUBOXONE) 2-0.5 mg per SL tablet 1 tablet, 1 tablet, Sublingual, Daily, Bonnielee Haff, MD, 1 tablet at 05/17/21 1032   [START ON 05/19/2021] buprenorphine-naloxone (SUBOXONE) 2-0.5 mg per SL tablet 1 tablet, 1 tablet, Sublingual, Daily, Bonnielee Haff, MD   calcium carbonate (OS-CAL - dosed in mg of elemental calcium) tablet 500 mg of elemental calcium, 1 tablet, Oral, TID WC, Bonnielee Haff, MD, 500 mg of elemental calcium at 05/18/21 1004   calcium carbonate (TUMS - dosed in mg elemental calcium) chewable tablet 1,000 mg, 1,000 mg, Oral, Daily PRN, Mansy, Jan A, MD, 1,000 mg at 05/18/21 0126   cholecalciferol (VITAMIN D3) tablet 2,000 Units, 2,000 Units, Oral, BID, Mansy, Jan A, MD, 2,000 Units at 05/17/21 2110   diclofenac Sodium (VOLTAREN) 1 % topical gel 2 g, 2 g, Topical, TID PRN, Bonnielee Haff, MD   doxepin (SINEQUAN) capsule 10 mg, 10 mg, Oral, QHS, Mansy, Jan A, MD, 10 mg at 05/17/21 2111   DULoxetine (CYMBALTA) DR capsule 60 mg, 60  mg, Oral, Daily, 60 mg at 05/18/21 1005 **AND** DULoxetine (CYMBALTA) DR capsule 30 mg, 30 mg, Oral, QHS, Mansy, Jan A, MD, 30 mg at 05/17/21 2110   enoxaparin (LOVENOX) injection 30 mg, 30 mg, Subcutaneous, Q12H, Georgeanna Harrison, MD, 30 mg at 05/17/21 2114   ferrous sulfate tablet 325 mg, 325 mg, Oral, Q M,W,F, Mansy, Jan A, MD, 325 mg at 05/16/21 0841   fluticasone (FLONASE) 50 MCG/ACT nasal spray 2 spray, 2 spray, Each Nare, Daily PRN, Mansy, Jan A, MD   folic acid (FOLVITE) tablet 1 mg, 1 mg, Oral, Daily, Mansy, Jan A, MD, 1 mg at 05/18/21 1005   galantamine (RAZADYNE ER) 24 hr capsule 16 mg, 16 mg, Oral, QHS, Mansy, Jan A, MD, 16 mg at 05/17/21 2110   HYDROcodone-acetaminophen (NORCO) 7.5-325 MG per tablet 1-2 tablet, 1-2 tablet, Oral, Q4H PRN, Georgeanna Harrison, MD, 2 tablet at 05/18/21 0300   HYDROcodone-acetaminophen (NORCO/VICODIN) 5-325 MG per tablet 1-2 tablet, 1-2 tablet, Oral, Q4H PRN, Georgeanna Harrison, MD, 2 tablet at 05/17/21 1444   hydrOXYzine (ATARAX) tablet 20 mg, 20 mg, Oral,  QHS, Mansy, Jan A, MD, 20 mg at 05/17/21 2111   lipase/protease/amylase (CREON) capsule 36,000 Units, 36,000 Units, Oral, TID WC, Mansy, Arvella Merles, MD, 36,000 Units at 05/18/21 1002   magnesium hydroxide (MILK OF MAGNESIA) suspension 30 mL, 30 mL, Oral, Daily PRN, Mansy, Jan A, MD   memantine [facility]) tablet 10 mg, 10 mg, Oral, Daily, 10 mg at 05/18/21 0959 **AND** memantine (NAMENDA) tablet 20 mg, 20 mg, Oral, QHS, Mansy, Jan A, MD, 20 mg at 05/17/21 2111   menthol-cetylpyridinium (CEPACOL) lozenge 3 mg, 1 lozenge, Oral, PRN **OR** phenol (CHLORASEPTIC) mouth spray 1 spray, 1 spray, Mouth/Throat, PRN, Georgeanna Harrison, MD   methocarbamol (ROBAXIN) tablet 500 mg, 500 mg, Oral, Q6H PRN **OR** methocarbamol (ROBAXIN) 500 mg in dextrose 5 % 50 mL IVPB, 500 mg, Intravenous, Q6H PRN, Mansy, Jan A, MD   metoCLOPramide (REGLAN) tablet 5-10 mg, 5-10 mg, Oral, Q8H PRN **OR** metoCLOPramide (REGLAN) injection 5-10 mg, 5-10 mg,  Intravenous, Q8H PRN, Georgeanna Harrison, MD   morphine (PF) 2 MG/ML injection 0.5-1 mg, 0.5-1 mg, Intravenous, Q2H PRN, Georgeanna Harrison, MD, 1 mg at 05/18/21 0448   multivitamin with minerals tablet 1 tablet, 1 tablet, Oral, Daily, Mansy, Jan A, MD, 1 tablet at 05/18/21 1005   nutrition supplement (JUVEN) (JUVEN) powder packet 1 packet, 1 packet, Oral, BID BM, Bonnielee Haff, MD, 1 packet at 05/18/21 0958   ondansetron (ZOFRAN) tablet 4 mg, 4 mg, Oral, Q6H PRN **OR** ondansetron (ZOFRAN) injection 4 mg, 4 mg, Intravenous, Q6H PRN, Georgeanna Harrison, MD   pantoprazole (PROTONIX) EC tablet 40 mg, 40 mg, Oral, QHS, Bonnielee Haff, MD   polyethylene glycol (MIRALAX / GLYCOLAX) packet 17 g, 17 g, Oral, BID, Bonnielee Haff, MD, 17 g at 05/18/21 5631   polyvinyl alcohol (LIQUIFILM TEARS) 1.4 % ophthalmic solution 1 drop, 1 drop, Both Eyes, TID PRN, Bonnielee Haff, MD   senna-docusate (Senokot-S) tablet 2 tablet, 2 tablet, Oral, BID, Bonnielee Haff, MD, 2 tablet at 05/18/21 1003   simethicone (MYLICON) chewable tablet 160 mg, 160 mg, Oral, QID PRN, Mansy, Jan A, MD   sodium chloride (OCEAN) 0.65 % nasal spray 1 spray, 1 spray, Each Nare, QHS, Mansy, Jan A, MD, 1 spray at 05/17/21 2116   vitamin B-12 (CYANOCOBALAMIN) tablet 50 mcg, 50 mcg, Oral, Daily, Mansy, Jan A, MD, 50 mcg at 05/18/21 1000   zinc sulfate capsule 220 mg, 220 mg, Oral, Daily, Mansy, Jan A, MD, 220 mg at 05/18/21 1005  Patients Current Diet:  Diet Order             Diet regular Room service appropriate? Yes; Fluid consistency: Thin  Diet effective now                   Precautions / Restrictions Precautions Precautions: Fall Restrictions Weight Bearing Restrictions: Yes RUE Weight Bearing: Weight bearing as tolerated RLE Weight Bearing: Weight bearing as tolerated Other Position/Activity Restrictions: RUE shoulder </ 90' flexion and abduction   Has the patient had 2 or more falls or a fall with injury in the past year?  Yes  Prior Activity Level Community (5-7x/wk): Pt. as active in the community PTA  Prior Functional Level Self Care: Did the patient need help bathing, dressing, using the toilet or eating? Independent  Indoor Mobility: Did the patient need assistance with walking from room to room (with or without device)? Independent  Stairs: Did the patient need assistance with internal or external stairs (with or without device)? Independent  Functional Cognition: Did  the patient need help planning regular tasks such as shopping or remembering to take medications? Independent  Patient Information Are you of Hispanic, Latino/a,or Spanish origin?: A. No, not of Hispanic, Latino/a, or Spanish origin What is your race?: A. White Do you need or want an interpreter to communicate with a doctor or health care staff?: 0. No  Patient's Response To:  Health Literacy and Transportation Is the patient able to respond to health literacy and transportation needs?: Yes Health Literacy - How often do you need to have someone help you when you read instructions, pamphlets, or other written material from your doctor or pharmacy?: Never In the past 12 months, has lack of transportation kept you from medical appointments or from getting medications?: Yes In the past 12 months, has lack of transportation kept you from meetings, work, or from getting things needed for daily living?: No  Development worker, international aid / [place] Devices/Equipment: Blood pressure cuff, Environmental consultant (specify type), Wheelchair, Wellsite geologist, Cabin crew, Teacher, adult education, Radio producer (specify quad or straight) Home Equipment: Rollator (4 wheels), Tub bench, Transport chair, Cane - single point  Prior Device Use: Indicate devices/aids used by the patient prior to current illness, exacerbation or injury? None of the above  Current Functional Level Cognition  Overall Cognitive Status: Within Functional Limits for tasks assessed Orientation Level:  Oriented X4 General Comments: able to state needs and problem solve safe movement. highly motivated    Extremity Assessment (includes Sensation/Coordination)  Upper Extremity Assessment: Overall WFL for tasks assessed, RUE deficits/detail RUE Deficits / Details: restricted AROM RUE Sensation: WNL RUE Coordination: WNL  Lower Extremity Assessment: Defer to PT evaluation RLE Deficits / Details: s/p R hip IMN; knee ext <3/5 but able to achieve partial range LAQ, hip <3/5 with expected post-op pain and weakness RLE Sensation: history of peripheral neuropathy RLE Coordination: decreased gross motor    ADLs  Overall ADL's : Needs assistance/impaired Eating/Feeding: Independent, Sitting Grooming: Set up, Sitting Upper Body Bathing: Set up, Sitting Lower Body Bathing: Maximal assistance, Sitting/lateral leans Upper Body Dressing : Minimal assistance, Sitting Lower Body Dressing: Maximal assistance, Sitting/lateral leans Toilet Transfer: Moderate assistance, BSC/3in1, Stand-pivot    Mobility  Overal bed mobility: Needs Assistance Bed Mobility: Supine to Sit Supine to sit: Mod assist General bed mobility comments: modA to move RLE and to complete trunk movement    Transfers  Overall transfer level: Needs assistance Equipment used: Rolling walker (2 wheels) Transfers: Sit to/from Stand, Bed to chair/wheelchair/BSC Sit to Stand: Min assist, From elevated surface, Mod assist Bed to/from chair/wheelchair/BSC transfer type:: Step pivot Step pivot transfers: Mod assist General transfer comment: increased time and effort to power up, pt with significant pain but completing with only minA. small steps to [facility] with minimal wt throuh RLE. modA to rise from lower BSC    Ambulation / Gait / Stairs / Wheelchair Mobility  Ambulation/Gait General Gait Details: pt unable to wt bear on R hip enough to step with LLE, dependent on heel-toe pivotal movements at this time    Posture / Balance  Balance Overall balance assessment: Needs assistance Sitting-balance support: No upper extremity supported, Feet supported Sitting balance-Leahy Scale: Good Standing balance support: Reliant on assistive device for balance Standing balance-Leahy Scale: Poor Standing balance comment: dependent on BUE support    Special needs/care consideration Skin surgical incision    Previous Home Environment (from acute therapy documentation) Living Arrangements: Spouse/significant other  Lives With: Spouse Available Help at Discharge: Family, Available 24 hours/day Type of  Home: House Home Layout: One level Home Access: Level entry Bathroom Shower/Tub: Chiropodist: Standard Bathroom Accessibility: Yes How Accessible: Accessible via walker Home Care Services: No Additional Comments: reports wife not able to physically assist with mobility, but could help some with ADLs  Discharge Living Setting Plans for Discharge Living Setting: Patient's home Type of Home at Discharge: House Discharge Home Layout: One level Discharge Home Access: Level entry Discharge Bathroom Shower/Tub: Tub/shower unit Discharge Bathroom Toilet: Standard Discharge Bathroom Accessibility: Yes How Accessible: Accessible via walker, Accessible via wheelchair Does the patient have any problems obtaining your medications?: No  Social/Family/Support Systems Patient Roles: Spouse Contact Information: [phone] Anticipated Caregiver: Cletis [place] Ability/Limitations of Caregiver: Can provide superviision Caregiver Availability: 24/7 Discharge Plan Discussed with Primary Caregiver: Yes Is Caregiver In Agreement with Plan?: Yes Does Caregiver/Family have Issues with Lodging/Transportation while Pt is in Rehab?: No  Goals Patient/Family Goal for Rehab: PT/OT Supervisoin Expected length of stay: 12-14  days Pt/Family Agrees to Admission and willing to participate: Yes Program Orientation Provided &  Reviewed with Pt/Caregiver Including Roles  & Responsibilities: Yes  Decrease burden of Care through IP rehab admission: Specialzed equipment needs, Decrease number of caregivers, and Patient/family education  Possible need for SNF placement upon discharge: not anticipated   Patient Condition: I have reviewed medical records from [facility] , spoken with CM, and patient. I met with patient at the bedside for inpatient rehabilitation assessment.  Patient will benefit from ongoing PT and OT, can actively participate in 3 hours of therapy a day 5 days of the week, and can make measurable gains during the admission.  Patient will also benefit from the coordinated team approach during an Inpatient Acute Rehabilitation admission.  The patient will receive intensive therapy as well as Rehabilitation physician, nursing, social worker, and care management interventions.  Due to safety, disease management, medication administration, pain management, and patient education the patient requires 24 hour a day rehabilitation nursing.  The patient is currently min A with mobility and basic ADLs.  Discharge setting and therapy post discharge at home with home health is anticipated.  Patient has agreed to participate in the Acute Inpatient Rehabilitation Program and will admit today.  Preadmission Screen Completed By:  Genella Mech, 05/18/2021 10:06 AM ______________________________________________________________________   Discussed status with Dr. Dagoberto Ligas  on 05/20/21  at 37 and received approval for admission today.  Admission Coordinator:  Genella Mech, CCC-SLP, time 1030/Date 05/20/21   Assessment/Plan: Diagnosis: Does the need for close, 24 hr/day Medical supervision in concert with the patient's rehab needs make it unreasonable for this patient to be served in a less intensive setting? Yes Co-Morbidities requiring supervision/potential complications: Afib, watchman device, R hip fx s/p IM  nail,a criomial fx, dementia, suboxone for pain Due to bladder management, bowel management, safety, skin/wound care, disease management, medication administration, pain management, and patient education, does the patient require 24 hr/day rehab nursing? Yes Does the patient require coordinated care of a physician, rehab nurse, PT, OT, and SLP to address physical and functional deficits in the context of the above medical diagnosis(es)? Yes Addressing deficits in the following areas: balance, endurance, locomotion, strength, transferring, bowel/bladder control, bathing, dressing, feeding, grooming, and toileting Can the patient actively participate in an intensive therapy program of at least 3 hrs of therapy 5 days a week? Yes The potential for patient to make measurable gains while on inpatient rehab is good Anticipated functional outcomes upon discharge from inpatient rehab:  supervision PT, supervision OT, n/a SLP Estimated rehab length of stay to reach the above functional goals is: 12-14 days Anticipated discharge destination: Home 10. Overall Rehab/Functional Prognosis: good   MD Signature:

## 2021-05-18 NOTE — Plan of Care (Signed)
  Problem: Activity: Goal: Risk for activity intolerance will decrease Outcome: Progressing   Problem: Nutrition: Goal: Adequate nutrition will be maintained Outcome: Progressing   Problem: Coping: Goal: Level of anxiety will decrease Outcome: Progressing   Problem: Elimination: Goal: Will not experience complications related to bowel motility Outcome: Progressing   Problem: Safety: Goal: Ability to remain free from injury will improve Outcome: Progressing   

## 2021-05-18 NOTE — Plan of Care (Signed)
  Problem: Clinical Measurements: Goal: Respiratory complications will improve Outcome: Progressing   Problem: Activity: Goal: Risk for activity intolerance will decrease Outcome: Progressing   Problem: Nutrition: Goal: Adequate nutrition will be maintained Outcome: Progressing   Problem: Coping: Goal: Level of anxiety will decrease Outcome: Progressing   Problem: Elimination: Goal: Will not experience complications related to bowel motility Outcome: Progressing Goal: Will not experience complications related to urinary retention Outcome: Progressing   

## 2021-05-18 NOTE — Progress Notes (Signed)
Orthopaedics Daily Progress Note ? ? ?05/18/2021   9:53 AM ? ?Jack Neal is a 76 y.o. male 3 Days Post-Op s/p INTRAMEDULLARY (IM) NAIL INTERTROCHANTRIC ? ?Subjective ?Pain controlled.  Denies nausea, vomiting, or fevers.  Worked with PT yesterday and tolerated a good bit of ambulating.  Out of bed to chair.  Progressing well. ? ?Objective ?Vitals:  ? 05/18/21 0115 05/18/21 0746  ?BP:  106/72  ?Pulse: 70 82  ?Resp: 14 16  ?Temp:  98.2 ?F (36.8 ?C)  ?SpO2: 97% 100%  ? ? ?Intake/Output Summary (Last 24 hours) at 05/18/2021 0953 ?Last data filed at 05/18/2021 0709 ?Gross per 24 hour  ?Intake 315 ml  ?Output 1750 ml  ?Net -1435 ml  ? ? ? ?Physical Exam ?RLE: ?Dressing clean, dry, and intact ?+DF/PF/EHL ?SILT SP/DP/T ?WWP distally ? ?Assessment ?76 y.o. male s/p Procedure(s) (LRB): ?INTRAMEDULLARY (IM) NAIL INTERTROCHANTRIC (Right) ? ?Plan ?Mobility: Out of bed with PT/OT ?Pain control: Continue to wean/titrate to appropriate oral regimen ?DVT Prophylaxis: Lovenox 30 mg twice daily x6 weeks ?Further surgical plans: None ?RUE: Weightbearing as tolerated, limited abduction and forward flexion to 90 degrees due to acromial fracture ?LUE: Weightbearing as tolerated, no restrictions ?RLE: Weightbearing as tolerated, no restrictions ?LLE: Weightbearing as tolerated, no restrictions ?Disposition: Per primary team, as medically appropriate ?Dressing care: Keep AQUACEL on and dry for up to 14 days.  Do not allow surgical area to get wet before that.  Remove AQUACEL dressing after 14 days and allow area to get wet in shower but DO NOT SUBMERGE until wound is evaluated in clinic.  In most cases skin glue is used and no additional dressing is necessary.  ?Follow-up: Please call [facility] [phone]) to schedule follow-op appointment for 2 weeks after surgery. ? ?I have verified that my discharge instructions and follow-up information have been entered in the Discharge Navigator in Epic.  These should  automatically populate in the AVS.  Please print the AVS in its entirety and ensure that the patient or a responsible party has a complete copy of the AVS before they are discharged.  If there are questions regarding discharge instructions or follow-up before the AVS is generated, please check the Discharge Navigator before attempting to contact the surgeon/office.  If unsure how to access the Discharge Navigator or the information contained in the Discharge Navigator, or how to generate/print the AVS, please contact the appropriate Nurse, learning disability.  ? ?Georgeanna Harrison M.D. ?Orthopaedic Surgery ?[facility] ? ?

## 2021-05-18 NOTE — Progress Notes (Signed)
Pt already on CPAP upon my arrival and resting comfortably.  ?

## 2021-05-18 NOTE — Progress Notes (Signed)
[facility] PROGRESS NOTE   Jack Neal [id number] DOB: April 22, 1945 DOA: 05/14/2021  3 DOS: the patient was seen and examined on 05/18/2021  PCP: [facility]  Brief History and Hospital Course:  76 y.o. Caucasian male with medical history significant for atrial fibrillation, depression, GERD, PTSD, peripheral neuropathy, obstructive sleep apnea on CPAP, status post pacemaker, status post Watchman device, who presented to the ER after sustaining a mechanical fall.  He fell on his right side.  Came in with the right hip and shoulder pain.  Found to have right hip fracture along with a fracture of the right acromion.  He was hospitalized for further management.  Patient underwent surgery on 3/2.  Had drop in hemoglobin requiring blood transfusion.  Transfused another unit of PRBC on 3/4.  Consultants: Orthopedics.  Trauma surgery.  Procedures: None yet    Subjective: Patient mentions that he had some pain in his right leg earlier this morning but much better after he was given pain medications.  Denies any chest pain shortness of breath.  No dizziness or lightheadedness    Assessment/Plan:   * Closed right hip fracture [facility]) Patient underwent ORIF on 3/2.  Pain seems to be reasonably well controlled.  Osteopenia suggested on imaging studies.  Vitamin D level is 51.15.   Seen by physical therapy.  Inpatient rehabilitation was recommended.  Closed fracture of acromion Seen by orthopedics.  Looks like this is being treated conservatively.  Weightbearing as tolerated.  Permanent atrial fibrillation ([name]) He is status post PPM and Watchman left atrial appendage occlusive device.  Watchman device was placed on October 2022.  He was on Eliquis and aspirin for 45 days following which he has been on aspirin and Plavix.  To continue until April of this year.   Discussed with cardiology who are okay with holding the antiplatelet agents if necessary and to resume as soon as it  safe to do so.  Patient was continued on aspirin.   Plavix remains on hold due to transfusion requirements.  If hemoglobin is stable tomorrow we will resume Plavix.  Soft blood pressures noted.  Old records reviewed.  He tends to run low normal blood pressures at baseline.  He is asymptomatic.  Dementia without behavioral disturbance ([name]) Continue Razadyne ER and Namenda..  Stable.  Thrombocytopenia ([name]) Platelet count low but stable.  No therapeutic changes to be made today.  Will monitor trends.    Macrocytic anemia Acute blood loss anemia  Drop in hemoglobin most likely due to surgery and fracture.  Has required 2 units of PRBC so far.  Hold off on transfusion today.  We will recheck tomorrow.   Anemia panel was reviewed.  No clear-cut deficiencies identified.  Hyponatremia Continue to monitor for now.  Pancreatic insufficiency We will continue pancrelipase.  Opioid dependence ([name]) The patient is on Suboxone chronically.  This is being continued.  Constipation Moderate stool burden noted on CT scan.  Started on laxatives and stool softeners.  Does not appear that he has had any bowel meant yet.  Will increase the dose and might need to give him suppository/enema.   OSA on CPAP We will continue CPAP nightly.  Abnormal thyroid function test TSH was checked due to constipation but noted to be 0.313.  Likely sick euthyroid.  Will recommend that this be rechecked in a few weeks.  No signs or symptoms of hyperthyroidism.  Dysuria UA noted to be normal.  No indication for antibiotics.  Continue to monitor  for now.     DVT Prophylaxis: On Lovenox twice a day per orthopedics Code Status: DNR Family Communication: Discussed with patient Disposition Plan: Inpatient rehabilitation hopefully by Monday or Tuesday  Status is: Inpatient Remains inpatient appropriate because: Right hip fracture      Medications: Scheduled:  acidophilus  1 capsule Oral Daily   vitamin C   250 mg Oral BID   aspirin EC  81 mg Oral Daily   buprenorphine-naloxone  1 tablet Sublingual Daily   calcium carbonate  1 tablet Oral TID WC   cholecalciferol  2,000 Units Oral BID   doxepin  10 mg Oral QHS   DULoxetine  60 mg Oral Daily   And   DULoxetine  30 mg Oral QHS   enoxaparin (LOVENOX) injection  30 mg Subcutaneous Q12H   ferrous sulfate  325 mg Oral Q M,W,F   folic acid  1 mg Oral Daily   galantamine  16 mg Oral QHS   hydrOXYzine  20 mg Oral QHS   lipase/protease/amylase  36,000 Units Oral TID WC   memantine  10 mg Oral Daily   And   memantine  20 mg Oral QHS   multivitamin with minerals  1 tablet Oral Daily   nutrition supplement (JUVEN)  1 packet Oral BID BM   polyethylene glycol  17 g Oral BID   senna-docusate  2 tablet Oral BID   sodium chloride  1 spray Each Nare QHS   vitamin B-12  50 mcg Oral Daily   zinc sulfate  220 mg Oral Daily   Continuous:  sodium chloride 75 mL/hr at 05/18/21 0302   methocarbamol (ROBAXIN) IV     [id number], bisacodyl, calcium carbonate, diclofenac Sodium, fluticasone, HYDROcodone-acetaminophen, HYDROcodone-acetaminophen, magnesium hydroxide, menthol-cetylpyridinium **OR** phenol, methocarbamol **OR** methocarbamol (ROBAXIN) IV, metoCLOPramide **OR** metoCLOPramide (REGLAN) injection, morphine injection, ondansetron **OR** ondansetron (ZOFRAN) IV, polyvinyl alcohol, simethicone  Antibiotics: Anti-infectives (From admission, onward)    Start     Dose/Rate Route Frequency Ordered Stop   05/15/21 2000  ceFAZolin (ANCEF) IVPB 2g/100 mL premix        2 g 200 mL/hr over 30 Minutes Intravenous Every 6 hours 05/15/21 1814 05/16/21 0846   05/15/21 1534  vancomycin (VANCOCIN) powder  Status:  Discontinued          As needed 05/15/21 1535 05/15/21 1646   05/15/21 1350  ceFAZolin (ANCEF) 2-4 GM/100ML-% IVPB       Note to Pharmacy: Roosvelt Maser N: cabinet override      05/15/21 1350 05/16/21 0159       Objective:  Vital  Signs  Vitals:   05/17/21 1527 05/17/21 2118 05/18/21 0115 05/18/21 0746  BP: 102/61 110/62  106/72  Pulse: 81 81 70 82  Resp: 20 14 14 16   Temp: 97.6 F (36.4 C) 97.8 F (36.6 C)  98.2 F (36.8 C)  TempSrc: Oral Oral  Oral  SpO2: 100% 100% 97% 100%  Weight:      Height:        Intake/Output Summary (Last 24 hours) at 05/18/2021 0903 Last data filed at 05/18/2021 0709 Gross per 24 hour  Intake 315 ml  Output 1750 ml  Net -1435 ml   Filed Weights   05/14/21 2339  Weight: 77.1 kg    General appearance: Awake alert.  In no distress Resp: Clear to auscultation bilaterally.  Normal effort Cardio: S1-S2 is normal regular.  No S3-S4.  No rubs murmurs or bruit GI: Abdomen is soft.  Nontender nondistended.  Bowel sounds are present normal.  No masses organomegaly Extremities: Swelling of the right thigh noted.  No active bleeding noted. Neurologic: Alert and oriented x3.  No focal neurological deficits.     Lab Results:  Data Reviewed: I have personally reviewed labs and imaging study reports  CBC: Recent Labs  Lab 05/14/21 2239 05/15/21 0822 05/16/21 0211 05/16/21 1456 05/17/21 0224 05/18/21 0211  WBC 5.2 6.2 7.2  --  7.8 5.6  HGB 11.8* 10.1* 7.3* 7.3* 7.5* 7.9*  HCT 37.0* 32.3* 22.0* 22.3* 22.0* 23.0*  MCV 103.1* 100.0 99.1  --  94.4 94.7  PLT 124* 110* 98*  --  94* 94*    Basic Metabolic Panel: Recent Labs  Lab 05/14/21 2239 05/15/21 0822 05/16/21 0211 05/17/21 0224 05/18/21 0211  NA 139 139 130* 137 138  K 4.2 3.8 4.1 4.0 3.8  CL 103 105 100 103 98  CO2 29 27 23 28  33*  GLUCOSE 98 114* 178* 106* 95  BUN 13 14 18 19 22   CREATININE 0.82 0.60* 0.82 0.71 0.67  CALCIUM 8.5* 8.2* 7.6* 8.1* 8.2*    GFR: Estimated Creatinine Clearance: 86.3 mL/min (by C-G formula based on SCr of 0.67 mg/dL).  Liver Function Tests: Recent Labs  Lab 05/14/21 2239  AST 20  ALT 12  ALKPHOS 143*  BILITOT 1.2  PROT 6.0*  ALBUMIN 3.4*     Coagulation  Profile: Recent Labs  Lab 05/14/21 2239  INR 1.2     Recent Results (from the past 240 hour(s))  Resp Panel by RT-PCR (Flu A&B, Covid) Nasopharyngeal Swab     Status: None   Collection Time: 05/14/21 11:39 PM   Specimen: Nasopharyngeal Swab; Nasopharyngeal(NP) swabs in vial transport medium  Result Value Ref Range Status   SARS Coronavirus 2 by RT PCR NEGATIVE NEGATIVE Final    Comment: (NOTE) SARS-CoV-2 target nucleic acids are NOT DETECTED.  The SARS-CoV-2 RNA is generally detectable in upper respiratory specimens during the acute phase of infection. The lowest concentration of SARS-CoV-2 viral copies this assay can detect is 138 copies/mL. A negative result does not preclude SARS-Cov-2 infection and should not be used as the sole basis for treatment or other patient management decisions. A negative result may occur with  improper specimen collection/handling, submission of specimen other than nasopharyngeal swab, presence of viral mutation(s) within the areas targeted by this assay, and inadequate number of viral copies(<138 copies/mL). A negative result must be combined with clinical observations, patient history, and epidemiological information. The expected result is Negative.  Fact Sheet for Patients:  EntrepreneurPulse.com.au  Fact Sheet for Healthcare Providers:  IncredibleEmployment.be  This test is no t yet approved or cleared by the [place] FDA and  has been authorized for detection and/or diagnosis of SARS-CoV-2 by FDA under an Emergency Use Authorization (EUA). This EUA will remain  in effect (meaning this test can be used) for the duration of the COVID-19 declaration under Section 564(b)(1) of the Act, 21 U.S.C.section 360bbb-3(b)(1), unless the authorization is terminated  or revoked sooner.       Influenza A by PCR NEGATIVE NEGATIVE Final   Influenza B by PCR NEGATIVE NEGATIVE Final    Comment: (NOTE) The Xpert  Xpress SARS-CoV-2/FLU/RSV plus assay is intended as an aid in the diagnosis of influenza from Nasopharyngeal swab specimens and should not be used as a sole basis for treatment. Nasal washings and aspirates are unacceptable for Xpert Xpress SARS-CoV-2/FLU/RSV testing.  Fact Sheet for Patients: EntrepreneurPulse.com.au  Fact Sheet  for Healthcare Providers: IncredibleEmployment.be  This test is not yet approved or cleared by the [place] and has been authorized for detection and/or diagnosis of SARS-CoV-2 by FDA under an Emergency Use Authorization (EUA). This EUA will remain in effect (meaning this test can be used) for the duration of the COVID-19 declaration under Section 564(b)(1) of the Act, 21 U.S.C. section 360bbb-3(b)(1), unless the authorization is terminated or revoked.  Performed at [facility], [place] [address] [place], [place] [address]   Surgical pcr screen     Status: None   Collection Time: 05/15/21  1:48 PM   Specimen: Nasal Mucosa; Nasal Swab  Result Value Ref Range Status   MRSA, PCR NEGATIVE NEGATIVE Final   Staphylococcus aureus NEGATIVE NEGATIVE Final    Comment: (NOTE) The Xpert SA Assay (FDA approved for NASAL specimens in patients 12 years of age and older), is one component of a comprehensive surveillance program. It is not intended to diagnose infection nor to guide or monitor treatment. Performed at [facility], [place] [address], [place], [place] [address]   Urine Culture     Status: Abnormal   Collection Time: 05/16/21 10:23 AM   Specimen: Urine, Clean Catch  Result Value Ref Range Status   Specimen Description URINE, CLEAN CATCH  Final   Special Requests NONE  Final   Culture (A)  Final    <10,000 COLONIES/mL INSIGNIFICANT GROWTH Performed at [facility], [place] [address] [place], [place] [address]    Report Status 05/17/2021 FINAL  Final       Radiology Studies: No results  found.     LOS: 3 days   Sibbie Flammia [employer] on [email or web address]  05/18/2021, 9:03 AM

## 2021-05-18 NOTE — Progress Notes (Signed)
Mobility Specialist Progress Note: ? ? 05/18/21 1140  ?Mobility  ?Activity Transferred from bed to chair  ?Level of Assistance Contact guard assist, steadying assist  ?Assistive Device Front wheel walker  ?Distance Ambulated (ft) 6 ft  ?$Mobility charge 1 Mobility  ? ?PT received in bed willing to participate in mobility. Complaints of R leg pain. Pt was able to stand without assist from elevated bed. Left in chair with call bell in reach and all needs met. ? ?Jack Neal ?Mobility Specialist ?Primary Phone [phone] ? ?

## 2021-05-19 LAB — BASIC METABOLIC PANEL
Anion gap: 6 (ref 5–15)
BUN: 25 mg/dL — ABNORMAL HIGH (ref 8–23)
CO2: 33 mmol/L — ABNORMAL HIGH (ref 22–32)
Calcium: 8.5 mg/dL — ABNORMAL LOW (ref 8.9–10.3)
Chloride: 97 mmol/L — ABNORMAL LOW (ref 98–111)
Creatinine, Ser: 0.64 mg/dL (ref 0.61–1.24)
GFR, Estimated: >60 mL/min (ref >60)
Glucose, Bld: 100 mg/dL — ABNORMAL HIGH (ref 70–99)
Potassium: 3.9 mmol/L (ref 3.5–5.1)
Sodium: 136 mmol/L (ref 135–145)

## 2021-05-19 LAB — CBC
HCT: 24.3 % — ABNORMAL LOW (ref 39.0–52.0)
Hemoglobin: 7.8 g/dL — ABNORMAL LOW (ref 13.0–17.0)
MCH: 31.3 pg (ref 26.0–34.0)
MCHC: 32.1 g/dL (ref 30.0–36.0)
MCV: 97.6 fL (ref 80.0–100.0)
Platelets: 127 10*3/uL — ABNORMAL LOW (ref 150–400)
RBC: 2.49 MIL/uL — ABNORMAL LOW (ref 4.22–5.81)
RDW: 15.8 % — ABNORMAL HIGH (ref 11.5–15.5)
WBC: 4.7 10*3/uL (ref 4.0–10.5)
nRBC: 0.6 % — ABNORMAL HIGH (ref 0.0–0.2)

## 2021-05-19 LAB — TYPE AND SCREEN
ABO/RH(D): B POS
Antibody Screen: NEGATIVE
Unit division: 0
Unit division: 0
Unit division: 0

## 2021-05-19 LAB — BPAM RBC
Blood Product Expiration Date: 202303172359
Blood Product Expiration Date: 202303202359
Blood Product Expiration Date: 202303282359
ISSUE DATE / TIME: 202303012346
ISSUE DATE / TIME: 202303031034
ISSUE DATE / TIME: 202303041111
Unit Type and Rh: 5100
Unit Type and Rh: 7300
Unit Type and Rh: 7300

## 2021-05-19 MED ORDER — CLOPIDOGREL BISULFATE 75 MG PO TABS
75.0000 mg | ORAL_TABLET | Freq: Every day | ORAL | Status: DC
  Administered 2021-05-19 – 2021-05-20 (×2): 75 mg via ORAL
  Filled 2021-05-19 (×2): qty 1

## 2021-05-19 NOTE — Progress Notes (Signed)
Patient has CPAP set up at bedside. Auto settting (min 7 max 19) patient states he is able to place himself on/off as needed. Patient aware to call for assistance if needed. ?

## 2021-05-19 NOTE — Progress Notes (Signed)
[facility] PROGRESS NOTE   Hemal Spada [id number] DOB: April 10, 1945 DOA: 05/14/2021  4 DOS: the patient was seen and examined on 05/19/2021  [facility]P: [facility]  Brief History and Hospital Course:  76 y.o. Caucasian male with medical history significant for atrial fibrillation, depression, GERD, PTSD, peripheral neuropathy, obstructive sleep apnea on CPAP, status post pacemaker, status post Watchman device, who presented to the ER after sustaining a mechanical fall.  He fell on his right side.  Came in with the right hip and shoulder pain.  Found to have right hip fracture along with a fracture of the right acromion.  He was hospitalized for further management.  Patient underwent surgery on 3/2.  Had drop in hemoglobin requiring blood transfusion.  Transfused another unit of PRBC on 3/4.  Hemoglobin stable.  Now waiting on CIR.  Consultants: Orthopedics.  Trauma surgery.  Procedures: None yet    Subjective: Patient mentions that he does get pain in his right leg with movement.  Otherwise feels well.  No shortness of breath.  No nausea vomiting.  Had a bowel movement yesterday.   Assessment/Plan:   * Closed right hip fracture [facility]) Patient underwent ORIF on 3/2.  Pain seems to be reasonably well controlled.  Osteopenia suggested on imaging studies.  Vitamin D level is 51.15.   Seen by physical therapy.  Inpatient rehabilitation was recommended.  Rehabilitation team is following.  He is medically stable for discharge to inpatient rehabitation.  Closed fracture of acromion Seen by orthopedics.  Looks like this is being treated conservatively.  Weightbearing as tolerated.  Permanent atrial fibrillation ([place]) He is status post PPM and Watchman left atrial appendage occlusive device.  Watchman device was placed on October 2022.  He was on Eliquis and aspirin for 45 days following which he has been on aspirin and Plavix.  To continue until April of this year.   Discussed  with cardiology who are okay with holding the antiplatelet agents if necessary and to resume as soon as it safe to do so.  Patient was continued on aspirin.   Plavix was placed on hold for surgery.  And then continued on hold due to transfusion requirements.  Hemoglobin stable for the last 48 hours.  Will resume Plavix.  No overt bleeding noted.  Soft blood pressures noted.  Old records reviewed.  He tends to run low normal blood pressures at baseline.  He is asymptomatic.  Dementia without behavioral disturbance ([place]) Continue Razadyne ER and Namenda..  Stable.  Thrombocytopenia ([name]) Platelet counts have rebounded.  Macrocytic anemia Acute blood loss anemia  Drop in hemoglobin most likely due to surgery and fracture.  Has required 2 units of PRBC so far.  Hemoglobin stable the last 48 hours. Anemia panel was reviewed.  No clear-cut deficiencies identified.  Hyponatremia Appears to have resolved.  Pancreatic insufficiency We will continue pancrelipase.  Opioid dependence ([place]) The patient is on Suboxone chronically.  This is being continued.  Constipation Moderate stool burden noted on CT scan.  Started on laxatives and stool softeners.  Had 2 bowel movements yesterday.  Continue aggressive bowel regimen.    OSA on CPAP We will continue CPAP nightly.  Abnormal thyroid function test TSH was checked due to constipation but noted to be 0.313.  Likely sick euthyroid.  Will recommend that this be rechecked in a few weeks.  No signs or symptoms of hyperthyroidism.  Dysuria UA noted to be normal.  No indication for antibiotics.  Continue to monitor  for now.     DVT Prophylaxis: On Lovenox twice a day per orthopedics Code Status: DNR Family Communication: Discussed with patient Disposition Plan: Waiting on inpatient rehabilitation  Status is: Inpatient Remains inpatient appropriate because: Right hip fracture      Medications: Scheduled:  acidophilus  1 capsule Oral  Daily   vitamin C  250 mg Oral BID   aspirin EC  81 mg Oral Daily   bisacodyl  10 mg Rectal Once   buprenorphine-naloxone  1 tablet Sublingual Daily   calcium carbonate  1 tablet Oral TID WC   cholecalciferol  2,000 Units Oral BID   doxepin  10 mg Oral QHS   DULoxetine  60 mg Oral Daily   And   DULoxetine  30 mg Oral QHS   enoxaparin (LOVENOX) injection  30 mg Subcutaneous Q12H   ferrous sulfate  325 mg Oral Q M,W,F   folic acid  1 mg Oral Daily   galantamine  16 mg Oral QHS   hydrOXYzine  20 mg Oral QHS   lipase/protease/amylase  36,000 Units Oral TID WC   memantine  10 mg Oral Daily   And   memantine  20 mg Oral QHS   multivitamin with minerals  1 tablet Oral Daily   nutrition supplement (JUVEN)  1 packet Oral BID BM   pantoprazole  40 mg Oral QHS   polyethylene glycol  17 g Oral BID   senna-docusate  2 tablet Oral BID   sodium chloride  1 spray Each Nare QHS   vitamin B-12  50 mcg Oral Daily   zinc sulfate  220 mg Oral Daily   Continuous:  methocarbamol (ROBAXIN) IV     [id number], bisacodyl, calcium carbonate, diclofenac Sodium, fluticasone, HYDROcodone-acetaminophen, HYDROcodone-acetaminophen, magnesium hydroxide, menthol-cetylpyridinium **OR** phenol, methocarbamol **OR** methocarbamol (ROBAXIN) IV, metoCLOPramide **OR** metoCLOPramide (REGLAN) injection, morphine injection, ondansetron **OR** ondansetron (ZOFRAN) IV, polyvinyl alcohol, simethicone  Antibiotics: Anti-infectives (From admission, onward)    Start     Dose/Rate Route Frequency Ordered Stop   05/15/21 2000  ceFAZolin (ANCEF) IVPB 2g/100 mL premix        2 g 200 mL/hr over 30 Minutes Intravenous Every 6 hours 05/15/21 1814 05/16/21 0846   05/15/21 1534  vancomycin (VANCOCIN) powder  Status:  Discontinued          As needed 05/15/21 1535 05/15/21 1646   05/15/21 1350  ceFAZolin (ANCEF) 2-4 GM/100ML-% IVPB       Note to Pharmacy: Roosvelt Maser N: cabinet override      05/15/21 1350 05/16/21 0159        Objective:  Vital Signs  Vitals:   05/18/21 0115 05/18/21 0746 05/18/21 1506 05/18/21 2119  BP:  106/72 100/61 (!) 102/58  Pulse: 70 82 79 81  Resp: 14 16 17 16   Temp:  98.2 F (36.8 C) 97.7 F (36.5 C) 98.3 F (36.8 C)  TempSrc:  Oral Oral Oral  SpO2: 97% 100% 98% 99%  Weight:      Height:        Intake/Output Summary (Last 24 hours) at 05/19/2021 1124 Last data filed at 05/19/2021 0945 Gross per 24 hour  Intake 240 ml  Output 3650 ml  Net -3410 ml    Filed Weights   05/14/21 2339  Weight: 77.1 kg    General appearance: Awake alert.  In no distress Resp: Clear to auscultation bilaterally.  Normal effort Cardio: S1-S2 is normal regular.  No S3-S4.  No rubs murmurs or bruit GI: Abdomen  is soft.  Nontender nondistended.  Bowel sounds are present normal.  No masses organomegaly Neurologic: Alert and oriented x3.  No focal neurological deficits.      Lab Results:  Data Reviewed: I have personally reviewed labs and imaging study reports  CBC: Recent Labs  Lab 05/15/21 0822 05/16/21 0211 05/16/21 1456 05/17/21 0224 05/18/21 0211 05/19/21 0204  WBC 6.2 7.2  --  7.8 5.6 4.7  HGB 10.1* 7.3* 7.3* 7.5* 7.9* 7.8*  HCT 32.3* 22.0* 22.3* 22.0* 23.0* 24.3*  MCV 100.0 99.1  --  94.4 94.7 97.6  PLT 110* 98*  --  94* 94* 127*     Basic Metabolic Panel: Recent Labs  Lab 05/15/21 0822 05/16/21 0211 05/17/21 0224 05/18/21 0211 05/19/21 0204  NA 139 130* 137 138 136  K 3.8 4.1 4.0 3.8 3.9  CL 105 100 103 98 97*  CO2 27 23 28  33* 33*  GLUCOSE 114* 178* 106* 95 100*  BUN 14 18 19 22  25*  CREATININE 0.60* 0.82 0.71 0.67 0.64  CALCIUM 8.2* 7.6* 8.1* 8.2* 8.5*     GFR: Estimated Creatinine Clearance: 86.3 mL/min (by C-G formula based on SCr of 0.64 mg/dL).  Liver Function Tests: Recent Labs  Lab 05/14/21 2239  AST 20  ALT 12  ALKPHOS 143*  BILITOT 1.2  PROT 6.0*  ALBUMIN 3.4*      Coagulation Profile: Recent Labs  Lab 05/14/21 2239   INR 1.2      Recent Results (from the past 240 hour(s))  Resp Panel by RT-PCR (Flu A&B, Covid) Nasopharyngeal Swab     Status: None   Collection Time: 05/14/21 11:39 PM   Specimen: Nasopharyngeal Swab; Nasopharyngeal(NP) swabs in vial transport medium  Result Value Ref Range Status   SARS Coronavirus 2 by RT PCR NEGATIVE NEGATIVE Final    Comment: (NOTE) SARS-CoV-2 target nucleic acids are NOT DETECTED.  The SARS-CoV-2 RNA is generally detectable in upper respiratory specimens during the acute phase of infection. The lowest concentration of SARS-CoV-2 viral copies this assay can detect is 138 copies/mL. A negative result does not preclude SARS-Cov-2 infection and should not be used as the sole basis for treatment or other patient management decisions. A negative result may occur with  improper specimen collection/handling, submission of specimen other than nasopharyngeal swab, presence of viral mutation(s) within the areas targeted by this assay, and inadequate number of viral copies(<138 copies/mL). A negative result must be combined with clinical observations, patient history, and epidemiological information. The expected result is Negative.  Fact Sheet for Patients:  EntrepreneurPulse.com.au  Fact Sheet for Healthcare Providers:  IncredibleEmployment.be  This test is no t yet approved or cleared by the [place] FDA and  has been authorized for detection and/or diagnosis of SARS-CoV-2 by FDA under an Emergency Use Authorization (EUA). This EUA will remain  in effect (meaning this test can be used) for the duration of the COVID-19 declaration under Section 564(b)(1) of the Act, 21 U.S.C.section 360bbb-3(b)(1), unless the authorization is terminated  or revoked sooner.       Influenza A by PCR NEGATIVE NEGATIVE Final   Influenza B by PCR NEGATIVE NEGATIVE Final    Comment: (NOTE) The Xpert Xpress SARS-CoV-2/FLU/RSV plus assay is  intended as an aid in the diagnosis of influenza from Nasopharyngeal swab specimens and should not be used as a sole basis for treatment. Nasal washings and aspirates are unacceptable for Xpert Xpress SARS-CoV-2/FLU/RSV testing.  Fact Sheet for Patients: EntrepreneurPulse.com.au  Fact Sheet for  Healthcare Providers: IncredibleEmployment.be  This test is not yet approved or cleared by the [place] and has been authorized for detection and/or diagnosis of SARS-CoV-2 by FDA under an Emergency Use Authorization (EUA). This EUA will remain in effect (meaning this test can be used) for the duration of the COVID-19 declaration under Section 564(b)(1) of the Act, 21 U.S.C. section 360bbb-3(b)(1), unless the authorization is terminated or revoked.  Performed at [facility], [place] [address], [place], [place] [address]   Surgical pcr screen     Status: None   Collection Time: 05/15/21  1:48 PM   Specimen: Nasal Mucosa; Nasal Swab  Result Value Ref Range Status   MRSA, PCR NEGATIVE NEGATIVE Final   Staphylococcus aureus NEGATIVE NEGATIVE Final    Comment: (NOTE) The Xpert SA Assay (FDA approved for NASAL specimens in patients 27 years of age and older), is one component of a comprehensive surveillance program. It is not intended to diagnose infection nor to guide or monitor treatment. Performed at [facility], [place] [address] [place], [place] [address]   Urine Culture     Status: Abnormal   Collection Time: 05/16/21 10:23 AM   Specimen: Urine, Clean Catch  Result Value Ref Range Status   Specimen Description URINE, CLEAN CATCH  Final   Special Requests NONE  Final   Culture (A)  Final    <10,000 COLONIES/mL INSIGNIFICANT GROWTH Performed at [facility], [place] [address], [place], [place] [address]    Report Status 05/17/2021 FINAL  Final       Radiology Studies: No results found.     LOS: 4 days   Grecia Lynk  [employer] on [email or web address]  05/19/2021, 11:24 AM

## 2021-05-19 NOTE — H&P (Signed)
Orders   Physical Medicine and Rehabilitation Admission H&P    Chief Complaint  Patient presents with   Trauma    Fall  : HPI: Jack Neal is a 76 year old right-handed/ambidextrous male with history of PAF/pacemaker maintained on aspirin and Plavix, presence of watchman left atrial appendage closure device placed October 2022, PTSD, mild dementia maintained on Namenda as well as Razadyne, chronic pain syndrome/peripheral polyneuropathy maintained on Suboxone followed by Dr.IHEAGWARA at the [facility] [phone] extension [phone], OSA on CPAP, hypertension, tobacco use.  Per chart review patient lives with spouse.  1 level home.  Wife can provide very little physical assistance.  Patient was independent with a rollator prior to admission.  He does not drive.  Presented 05/14/2021 after mechanical fall without loss of consciousness landing on his right side.  Cranial CT scan negative.  CT cervical spine negative.  CT chest abdomen pelvis showed acute minimally displaced fracture of the right acromion.  Acute comminuted angulated intertrochanteric fracture of the right hip with moderate surrounding intramuscular and interstitial hemorrhage.  Patient underwent open treatment of right intertrochanteric femur fracture with intramedullary implant with interlocking screws 05/15/2021 per Dr. Mable Fill.  Patient is weightbearing as tolerated.  Conservative care of right acromion fracture limited abduction and forward flexion to 90 degrees due to fracture.  Placed on Lovenox for DVT prophylaxis.  He remains on aspirin and Plavix as prior to admission.  Acute blood loss anemia hemoglobin 8.2.  Transfused 1 unit packed red blood cells 3/4.  Therapy evaluations completed due to patient decreased functional mobility was admitted for a comprehensive rehab program.   Pt reports R shoulder pain 3-4/10 most of time- no worse and 5/10 on R hip - but can be better with meds.  LBM just before came to CIR  today.  Voiding well with condom catheter- says nursing plans to remove.  Having a lot of GERD- taking tums and restarted on protonix this AM.    Review of Systems  Constitutional:  Negative for chills and fever.  HENT:  Negative for hearing loss.   Eyes:  Negative for blurred vision and double vision.  Respiratory:  Negative for cough and shortness of breath.   Cardiovascular:  Negative for chest pain, palpitations and leg swelling.  Gastrointestinal:  Positive for constipation. Negative for heartburn, nausea and vomiting.       GERD  Genitourinary:  Negative for dysuria, flank pain and hematuria.  Musculoskeletal:  Positive for falls and myalgias.  Skin:  Negative for rash.  Psychiatric/Behavioral:  Positive for depression and memory loss.        PTSD  All other systems reviewed and are negative. 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   Watchman FLX 20   PTSD (post-traumatic stress disorder) 10/01/2020   Past Surgical History:  Procedure Laterality Date   INTRAMEDULLARY (IM) NAIL INTERTROCHANTERIC Right 05/15/2021   Procedure: INTRAMEDULLARY (IM) NAIL INTERTROCHANTRIC;  Surgeon: Georgeanna Harrison, MD;  Location: [facility];  Service: Orthopedics;  Laterality: Right;   LEFT ATRIAL APPENDAGE OCCLUSION N/A 01/02/2021   Procedure: LEFT ATRIAL APPENDAGE OCCLUSION;  Surgeon: Vickie Epley, MD;  Location: [facility];  Service: Cardiovascular;  Laterality: N/A;   TEE WITHOUT  CARDIOVERSION N/A 01/02/2021   Procedure: TRANSESOPHAGEAL ECHOCARDIOGRAM (TEE);  Surgeon: Vickie Epley, MD;  Location: [facility];  Service: Cardiovascular;  Laterality: N/A;   TEE WITHOUT CARDIOVERSION N/A 02/17/2021   Procedure: TRANSESOPHAGEAL ECHOCARDIOGRAM (TEE);  Surgeon: Geralynn Rile, MD;  Location: [facility];  Service: Cardiovascular;  Laterality: N/A;   Family History  Problem Relation Age of Onset   Heart attack Mother    Ulcers Father    Brain cancer Sister    Heart disease Neg Hx    Social History:  reports that he has quit smoking. His smoking use included cigarettes. He has never used smokeless tobacco. He reports that he does not currently use alcohol. He reports that he does not use drugs. Allergies:  Allergies  Allergen Reactions   Amphetamine Rash and Other (See Comments)    Altered mental status- amnesia (also)   Diazepam Rash    Other reaction(s): Amnesia, Mental Status Changes (intolerance), Other amnesia    Zoloft [Sertraline] Anxiety, Rash and Other (See Comments)    Panic attacks and Cardiovascular Arrest (ALSO)    Gabapentin     Tremors/jerking, hallucinations, dry mouth   Gluten Meal Diarrhea   Penicillin G Rash   Medications Prior to Admission  Medication Sig Dispense Refill   acetaminophen (TYLENOL) 500 MG tablet Take 500 mg by mouth every 6 (six) hours as needed for moderate pain.     aspirin EC 81 MG tablet Take 1 tablet (81 mg total) by mouth daily. Swallow whole. 90 tablet 3   azithromycin (ZITHROMAX) 500 MG tablet Take 1 tablet (500mg ) one hour prior to all dental visits until 07/03/2021. 3 tablet 4   bumetanide (BUMEX) 2 MG tablet Take 4 mg by mouth daily as needed (edema).     buprenorphine-naloxone (SUBOXONE) 2-0.5 mg SUBL SL tablet Place 1 tablet under the tongue daily.     calcium elemental as carbonate (TUMS ULTRA 1000) 400 MG chewable tablet Chew 1,000 mg by mouth daily as needed for heartburn.     camphor-menthol  (SARNA) lotion Apply 1 application topically daily as needed for itching.     Carboxymethylcellulose Sodium (ARTIFICIAL TEARS OP) Place 1 drop into both eyes 3 (three) times daily as needed (dry eyes).     Cholecalciferol (VITAMIN D) 50 MCG (2000 UT) tablet Take 2,000 Units by mouth 2 (two) times daily.     clopidogrel (PLAVIX) 75 MG tablet Take 1 tablet (75 mg total) by mouth daily. 90 tablet 1   Cyanocobalamin (VITAMIN B-12 PO) Take 6 mcg by mouth in the morning and at bedtime.     diclofenac Sodium (VOLTAREN) 1 % GEL Apply 2 g topically daily. For feet pain     doxepin (SINEQUAN) 10 MG capsule Take 10 mg by mouth at bedtime.     DULoxetine (CYMBALTA) 30 MG capsule Take 30-60 mg by mouth See admin instructions. 60 mg in the morning  30 mg at bedtime     ferrous sulfate 325 (65 FE) MG tablet Take 325 mg by mouth every Monday, Wednesday, and Friday.     fluticasone (FLONASE) 50 MCG/ACT nasal spray Place 2 sprays into both nostrils daily as needed for allergies or rhinitis.     galantamine (RAZADYNE ER) 8 MG 24  hr capsule Take 16 mg by mouth at bedtime.     hydrOXYzine (ATARAX/VISTARIL) 10 MG tablet Take 1 tablet (10 mg total) by mouth 3 (three) times daily as needed for anxiety (Severe jerking/tremors.). (Patient taking differently: Take 20 mg by mouth at bedtime.) 30 tablet 0   ketoconazole (NIZORAL) 2 % shampoo Apply 1 application topically every Monday, Wednesday, and Friday.     Krill Oil 1000 MG CAPS Take 1,000 mg by mouth daily.     lidocaine (LIDODERM) 5 % Place 1 patch onto the skin daily. feet     Lidocaine HCl 4 % CREA Apply 1 g topically daily as needed (pain). Icy Hot     loperamide (IMODIUM A-D) 2 MG tablet Take 2 mg by mouth 3 (three) times daily as needed for diarrhea or loose stools.     MAGNESIUM PO Take 375 mg by mouth in the morning and at bedtime.     memantine (NAMENDA) 10 MG tablet Take 10-20 mg by mouth See admin instructions. 10 mg in the morning  20 mg at bedtime      Multiple Vitamin (MULTIVITAMIN) capsule Take 1 capsule by mouth daily.     NON FORMULARY CPAP at bedtime     Pancrelipase, Lip-Prot-Amyl, (CREON PO) Take 36,000 Units by mouth 3 (three) times daily with meals.     POTASSIUM PO Take 450 mg by mouth in the morning and at bedtime.     prazosin (MINIPRESS) 1 MG capsule Take 2 mg by mouth at bedtime.     Probiotic CHEW Chew 3 capsules by mouth daily.     Simethicone 180 MG CAPS Take 180 mg by mouth daily as needed (gas).     sodium chloride (OCEAN) 0.65 % SOLN nasal spray Place 1 spray into both nostrils at bedtime.     Sodium Fluoride 1.1 % PSTE Place 1 application onto teeth 2 (two) times daily.     VITAMIN A PO Take 4,000 Units by mouth daily.     vitamin C (ASCORBIC ACID) 250 MG tablet Take 250 mg by mouth 2 (two) times daily.     zinc sulfate 220 (50 Zn) MG capsule Take 220 mg by mouth daily.     pantoprazole (PROTONIX) 40 MG tablet Take 1 tablet (40 mg total) by mouth daily. (Patient not taking: Reported on 05/15/2021) 60 tablet 2      Home: [place] expects to be discharged to:: Private residence Living Arrangements: Spouse/significant other Available Help at Discharge: Family, Available 24 hours/day Type of Home: House Home Access: Level entry Home Layout: One level Bathroom Shower/Tub: Chiropodist: Standard Bathroom Accessibility: Yes Home Equipment: Rollator (4 wheels), Tub bench, Transport chair, Cane - single point Additional Comments: reports wife not able to physically assist with mobility, but could help some with ADLs  Lives With: Spouse   Functional History: Prior Function Prior Level of Function : Independent/Modified Independent Mobility Comments: Mod indep ambulating "with my rollator 98% of the time, 1% with two canes and the other 1% to my bathroom just holding onto the wall" - reports need for DME is not a strength issue, but a balance issue. Currently working with [facility]  services. Retired Therapist, art ADLs Comments: Typically sponge bathes at sink, will use tub transfer bench 1x/wk for shower  Functional Status:  Mobility: Bed Mobility Overal bed mobility: Needs Assistance Bed Mobility: Supine to Sit Supine to sit: Min assist General bed mobility comments: pt OOB upon arrival Transfers Overall transfer  level: Needs assistance Equipment used: Rolling walker (2 wheels) Transfers: Sit to/from Stand Sit to Stand: Min assist, Mod assist Bed to/from chair/wheelchair/BSC transfer type:: Stand pivot Stand pivot transfers: Min guard, Min assist Step pivot transfers: Mod assist General transfer comment: increased time needed, minimal cuing needed for hand placement, min-mod assist for power up from low recliner Ambulation/Gait Ambulation/Gait assistance: Min assist, Mod assist Gait Distance (Feet): 18 Feet Assistive device: Rolling walker (2 wheels) Gait Pattern/deviations: Step-to pattern, Decreased step length - right, Decreased stance time - right, Trunk flexed, Antalgic General Gait Details: mod assist for verbal and tactile cues for LE sequecing, pt able to advance RLE forward and clear foot at start, with pt sliding RLE forward last few steps secondary to pain and fatigue. Gait velocity: decr Pre-gait activities: standing weight shift R<>L to accept weight on RLE    ADL: ADL Overall ADL's : Needs assistance/impaired Eating/Feeding: Independent, Sitting Grooming: Set up, Sitting Upper Body Bathing: Set up, Sitting Lower Body Bathing: Maximal assistance, Sitting/lateral leans Upper Body Dressing : Minimal assistance, Sitting Upper Body Dressing Details (indicate cue type and reason): educated on compensatory strategy to don UB clothing to adhere to RUE movement precautions Lower Body Dressing: Moderate assistance, Sitting/lateral leans Lower Body Dressing Details (indicate cue type and reason): don socks sitting up in chair Toilet Transfer: Min guard,  Rolling walker (2 wheels), Regular Toilet, Stand-pivot, Minimal assistance Toilet Transfer Details (indicate cue type and reason): simulated to chair Functional mobility during ADLs: Min guard, Minimal assistance, Rolling walker (2 wheels)  Cognition: Cognition Overall Cognitive Status: Within Functional Limits for tasks assessed Orientation Level: Oriented X4 Cognition Arousal/Alertness: Awake/alert Behavior During Therapy: WFL for tasks assessed/performed Overall Cognitive Status: Within Functional Limits for tasks assessed General Comments: pt motivated despite pain, cautious with movement  Physical Exam: Blood pressure (!) 102/58, pulse 81, temperature 98.3 F (36.8 C), temperature source Oral, resp. rate 16, height 5\' 11"  (1.803 m), weight 77.1 kg, SpO2 99 %. Physical Exam Vitals and nursing note reviewed.  Constitutional:      Comments: Pt appears stated age; sitting up in bedside chair; with condom catheter in place; NAD  HENT:     Head: Normocephalic and atraumatic.     Right Ear: External ear normal.     Left Ear: External ear normal.     Mouth/Throat:     Mouth: Mucous membranes are dry.     Pharynx: Oropharynx is clear. No oropharyngeal exudate.  Eyes:     General:        Right eye: No discharge.        Left eye: No discharge.     Extraocular Movements: Extraocular movements intact.  Cardiovascular:     Rate and Rhythm: Normal rate. Rhythm irregular.     Heart sounds: Normal heart sounds. No murmur heard.   No gallop.  Pulmonary:     Effort: Pulmonary effort is normal. No respiratory distress.     Breath sounds: Normal breath sounds. No wheezing, rhonchi or rales.  Abdominal:     General: Bowel sounds are normal. There is no distension.     Palpations: Abdomen is soft.     Tenderness: There is no abdominal tenderness.  Genitourinary:    Comments: Condom catheter in place- light amber urine in bag Musculoskeletal:     Cervical back: Neck supple. No tenderness.      Comments: UE strength 5/5 B/L LLE- 5/5 in HF, KE, DF and PF RLE- 2/5 in proximal RLE; 5/5  distally TTP over R lateral hip and groin as well as R acromium  Skin:    Comments: Right hip incision is dressed. Moderate swelling; no erythema; original surgical dressing in place Large purple bruise inner upper R arm 1-2+ LE edema to distal calves B/L Venous stasis changes distal 1/3 of calves B/L R forearm IV - looks OK  Neurological:     Mental Status: He is alert and oriented to person, place, and time.     Comments: Patient is alert.  Makes eye contact with examiner.  Provides name and age.  Limited medical historian.  Follows commands. Decreased sensation to light touch in lower legs bowel knees B/L  Otherwise, intact above knees and in arms  Psychiatric:        Mood and Affect: Mood normal.        Behavior: Behavior normal.    Results for orders placed or performed during the hospital encounter of 05/14/21 (from the past 48 hour(s))  CBC     Status: Abnormal   Collection Time: 05/18/21  2:11 AM  Result Value Ref Range   WBC 5.6 4.0 - 10.5 K/uL   RBC 2.43 (L) 4.22 - 5.81 MIL/uL   Hemoglobin 7.9 (L) 13.0 - 17.0 g/dL   HCT 23.0 (L) 39.0 - 52.0 %   MCV 94.7 80.0 - 100.0 fL   MCH 32.5 26.0 - 34.0 pg   MCHC 34.3 30.0 - 36.0 g/dL   RDW 16.4 (H) 11.5 - 15.5 %   Platelets 94 (L) 150 - 400 K/uL    Comment: Immature Platelet Fraction may be clinically indicated, consider ordering this additional test [id number] CONSISTENT WITH PREVIOUS RESULT REPEATED TO VERIFY    nRBC 0.0 0.0 - 0.2 %    Comment: Performed at [facility], [place] [address], [place], [place] [id number]  Basic metabolic panel     Status: Abnormal   Collection Time: 05/18/21  2:11 AM  Result Value Ref Range   Sodium 138 135 - 145 mmol/L   Potassium 3.8 3.5 - 5.1 mmol/L   Chloride 98 98 - 111 mmol/L   CO2 33 (H) 22 - 32 mmol/L   Glucose, Bld 95 70 - 99 mg/dL    Comment: Glucose reference range applies only to  samples taken after fasting for at least 8 hours.   BUN 22 8 - 23 mg/dL   Creatinine, Ser 0.67 0.61 - 1.24 mg/dL   Calcium 8.2 (L) 8.9 - 10.3 mg/dL   GFR, Estimated >60 >60 mL/min    Comment: (NOTE) Calculated using the CKD-EPI Creatinine Equation (2021)    Anion gap 7 5 - 15    Comment: Performed at [facility] [address], [place], [place] [address]  CBC     Status: Abnormal   Collection Time: 05/19/21  2:04 AM  Result Value Ref Range   WBC 4.7 4.0 - 10.5 K/uL   RBC 2.49 (L) 4.22 - 5.81 MIL/uL   Hemoglobin 7.8 (L) 13.0 - 17.0 g/dL   HCT 24.3 (L) 39.0 - 52.0 %   MCV 97.6 80.0 - 100.0 fL   MCH 31.3 26.0 - 34.0 pg   MCHC 32.1 30.0 - 36.0 g/dL   RDW 15.8 (H) 11.5 - 15.5 %   Platelets 127 (L) 150 - 400 K/uL   nRBC 0.6 (H) 0.0 - 0.2 %    Comment: Performed at [facility] [address], [place], [place] [id number]  Basic metabolic panel  Status: Abnormal   Collection Time: 05/19/21  2:04 AM  Result Value Ref Range   Sodium 136 135 - 145 mmol/L   Potassium 3.9 3.5 - 5.1 mmol/L   Chloride 97 (L) 98 - 111 mmol/L   CO2 33 (H) 22 - 32 mmol/L   Glucose, Bld 100 (H) 70 - 99 mg/dL    Comment: Glucose reference range applies only to samples taken after fasting for at least 8 hours.   BUN 25 (H) 8 - 23 mg/dL   Creatinine, Ser 0.64 0.61 - 1.24 mg/dL   Calcium 8.5 (L) 8.9 - 10.3 mg/dL   GFR, Estimated >60 >60 mL/min    Comment: (NOTE) Calculated using the CKD-EPI Creatinine Equation (2021)    Anion gap 6 5 - 15    Comment: Performed at [facility] [address] [place],  [address]   No results found.    Blood pressure (!) 102/58, pulse 81, temperature 98.3 F (36.8 C), temperature source Oral, resp. rate 16, height 5\' 11"  (1.803 m), weight 77.1 kg, SpO2 99 %.  Medical Problem List and Plan: 1. Functional deficits secondary to acute minimally displaced fracture of right acromion with conservative care/weightbearing as tolerated.  Acute  comminuted angulated intertrochanteric fracture of right hip with moderate surrounding intramuscular interstitial hemorrhage.  Status post open treatment with intramedullary implant and interlocking screws 05/15/2021.  Weightbearing as tolerated.  -patient may  shower if dressing covered  -ELOS/Goals: 10-14 days supervision 2.  Antithrombotics: -DVT/anticoagulation:  Pharmaceutical: Lovenox x6 weeks.  Check vascular study  -antiplatelet therapy: Aspirin 81 mg daily and Plavix 75 mg daily 3. Pain Management/chronic pain/peripheral polyneuropathy: Maintained on Suboxone 2-0.5 mg 1 tablet daily as prior to admission [facility] [phone] extension [phone], Voltaren gel as needed, Robaxin as needed, hydrocodone as needed 4. Mood/PTSD with dementia: Cymbalta 60 mg daily and 30 mg nightly, Namenda 10 mg daily and 20 mg nightly,Razadyne 16 mg nightly  -antipsychotic agents: N/A 5. Neuropsych: This patient is capable of making decisions on his own behalf. 6. Skin/Wound Care: Routine skin checks.  Keep Aquacel on and dry for up to 14 days. 7. Fluids/Electrolytes/Nutrition: Routine in and outs with follow-up chemistries 8.  Hypertension.  Monitor with increased mobility 9.  OSA.  CPAP 10.  Acute blood loss anemia.  S/p 2 units pRBCs. Follow-up CBC.  Continue iron supplement 11.  Atrial fibrillation/pacemaker/watchman.  Cardiac rate controlled. 12.  Pancreatic insufficiency.  Pancrelipase 13. GERD_ just restarted Porotonix- having a lot of GERD Sx's- con't tums prn   I have personally performed a face to face diagnostic evaluation of this patient and formulated the key components of the plan.  Additionally, I have personally reviewed laboratory data, imaging studies, as well as relevant notes and concur with the physician assistant's documentation above.   The patient's status has not changed from the original H&P.  Any changes in  documentation from the acute care chart have been noted above.     Lavon Paganini Angiulli, PA-C 05/19/2021

## 2021-05-19 NOTE — Progress Notes (Signed)
Orthopaedics Daily Progress Note ? ? ?05/19/2021   9:02 AM ? ?Jack Neal is a 76 y.o. male 4 Days Post-Op s/p INTRAMEDULLARY (IM) NAIL INTERTROCHANTRIC ? ?Subjective ?Pain controlled.  Denies nausea, vomiting, or fevers.   ? ?Objective ?Vitals:  ? 05/18/21 1506 05/18/21 2119  ?BP: 100/61 (!) 102/58  ?Pulse: 79 81  ?Resp: 17 16  ?Temp: 97.7 ?F (36.5 ?C) 98.3 ?F (36.8 ?C)  ?SpO2: 98% 99%  ? ? ?Intake/Output Summary (Last 24 hours) at 05/19/2021 0902 ?Last data filed at 05/19/2021 0420 ?Gross per 24 hour  ?Intake --  ?Output 2400 ml  ?Net -2400 ml  ? ? ? ?Physical Exam ?RLE: ?Dressing clean, dry, and intact ?+DF/PF/EHL ?SILT SP/DP/T ?WWP distally ? ?Assessment ?76 y.o. male s/p Procedure(s) (LRB): ?INTRAMEDULLARY (IM) NAIL INTERTROCHANTRIC (Right) ? ?Plan ?Mobility: Out of bed with PT/OT ?Pain control: Continue to wean/titrate to appropriate oral regimen ?DVT Prophylaxis: Lovenox 30 mg twice daily x6 weeks ?Further surgical plans: None ?RUE: Weightbearing as tolerated, limited abduction and forward flexion to 90 degrees due to acromial fracture ?LUE: Weightbearing as tolerated, no restrictions ?RLE: Weightbearing as tolerated, no restrictions ?LLE: Weightbearing as tolerated, no restrictions ?Disposition: Per primary team, as medically appropriate ?Dressing care: Keep AQUACEL on and dry for up to 14 days.  Do not allow surgical area to get wet before that.  Remove AQUACEL dressing after 14 days and allow area to get wet in shower but DO NOT SUBMERGE until wound is evaluated in clinic.  In most cases skin glue is used and no additional dressing is necessary.  ?Follow-up: Please call [facility] [phone]) to schedule follow-op appointment for 2 weeks after surgery. ? ?I have verified that my discharge instructions and follow-up information have been entered in the Discharge Navigator in Epic.  These should automatically populate in the AVS.  Please print the AVS in its entirety and ensure  that the patient or a responsible party has a complete copy of the AVS before they are discharged.  If there are questions regarding discharge instructions or follow-up before the AVS is generated, please check the Discharge Navigator before attempting to contact the surgeon/office.  If unsure how to access the Discharge Navigator or the information contained in the Discharge Navigator, or how to generate/print the AVS, please contact the appropriate Nurse, learning disability.  ? ?Georgeanna Harrison M.D. ?Orthopaedic Surgery ?[facility] ? ?

## 2021-05-19 NOTE — Progress Notes (Signed)
Occupational Therapy Treatment ?Patient Details ?Name: Jack Neal ?MRN: [id number] ?DOB: [birth year] ?Today's Date: 05/19/2021 ? ? ?History of present illness Pt is a 76 y.o. male admitted 05/14/21 after falling backwards while trying to pull on his slippers, denies LOC; became hypotensive after imaging requiring transfusion. Pt sustained R intertrochanteric hip fx, R posterior acromial process fx. S/p R hip IMN 3/2. PMH includes PAF, neuropathy, pacemaker, OSA on CPAP, PTSD, depression, chronic pain syndrome. ?  ?OT comments ? Pt progressing towards goals this session, requiring min-mod A for UB/LB ADLs, and min guard-min A for stand pivot transfers using RW. Min A for bed mobility, primarily to guide RLE to EOB. Pt still barely able to put weight through RLE at this time, states it is "less than 25%". Reviewed WB and movement precautions with pt, requiring minimal reminder during UE dressing session. Pt continues to be motivated on working to return to baseline, remains good candidate for AIR/CIR at d/c. Pt presenting with impairments listed below, will follow acutely to maximize safety and independence with ADLs and functional mobility.  ? ?Recommendations for follow up therapy are one component of a multi-disciplinary discharge planning process, led by the attending physician.  Recommendations may be updated based on patient status, additional functional criteria and insurance authorization. ?   ?Follow Up Recommendations ? Acute inpatient rehab (3hours/day)  ?  ?Assistance Recommended at Discharge Frequent or constant Supervision/Assistance  ?Patient can return home with the following ? A lot of help with walking and/or transfers;A lot of help with bathing/dressing/bathroom;Assistance with cooking/housework;Assist for transportation;Help with stairs or ramp for entrance ?  ?Equipment Recommendations ? None recommended by OT  ?  ?Recommendations for Other Services   ? ?  ?Precautions / Restrictions  Precautions ?Precautions: Fall ?Restrictions ?Weight Bearing Restrictions: Yes ?RUE Weight Bearing: Weight bearing as tolerated ?RLE Weight Bearing: Weight bearing as tolerated ?Other Position/Activity Restrictions: RUE shoulder </ 90' flexion and abduction  ? ? ?  ? ?Mobility Bed Mobility ?Overal bed mobility: Needs Assistance ?Bed Mobility: Supine to Sit ?  ?  ?Supine to sit: Min assist ?  ?  ?General bed mobility comments: min A to assist RLE to EOB ?  ? ?Transfers ?Overall transfer level: Needs assistance ?Equipment used: Rolling walker (2 wheels) ?Transfers: Sit to/from Stand, Bed to chair/wheelchair/BSC ?Sit to Stand: Min assist, Min guard, From elevated surface ?Stand pivot transfers: Min guard, Min assist ?  ?  ?  ?  ?General transfer comment: increased time needed, minimal cuing needed for hand placement ?  ?  ?Balance Overall balance assessment: Needs assistance ?Sitting-balance support: No upper extremity supported, Feet supported ?Sitting balance-Leahy Scale: Good ?  ?  ?Standing balance support: Reliant on assistive device for balance ?Standing balance-Leahy Scale: Poor ?Standing balance comment: dependent on BUE support ?  ?  ?  ?  ?  ?  ?  ?  ?  ?  ?  ?   ? ?ADL either performed or assessed with clinical judgement  ? ?ADL Overall ADL's : Needs assistance/impaired ?  ?  ?  ?  ?  ?  ?  ?  ?Upper Body Dressing : Minimal assistance;Sitting ?Upper Body Dressing Details (indicate cue type and reason): educated on compensatory strategy to don UB clothing to adhere to RUE movement precautions ?Lower Body Dressing: Moderate assistance;Sitting/lateral leans ?Lower Body Dressing Details (indicate cue type and reason): don socks sitting up in chair ?Toilet Transfer: Min guard;Rolling walker (2 wheels);Regular Toilet;Stand-pivot;Minimal assistance ?Toilet Transfer Details (indicate cue  type and reason): simulated to chair ?  ?  ?  ?  ?Functional mobility during ADLs: Min guard;Minimal assistance;Rolling walker (2  wheels) ?  ?  ? ?Extremity/Trunk Assessment Upper Extremity Assessment ?Upper Extremity Assessment: Overall WFL for tasks assessed ?RUE Deficits / Details: restricted AROM ?RUE Sensation: WNL ?RUE Coordination: WNL ?  ?Lower Extremity Assessment ?Lower Extremity Assessment: Defer to PT evaluation ?  ?  ?  ? ?Vision   ?Vision Assessment?: No apparent visual deficits ?  ?Perception Perception ?Perception: Within Functional Limits ?  ?Praxis Praxis ?Praxis: Intact ?  ? ?Cognition Arousal/Alertness: Awake/alert ?Behavior During Therapy: [facility] for tasks assessed/performed ?Overall Cognitive Status: Within Functional Limits for tasks assessed ?  ?  ?  ?  ?  ?  ?  ?  ?  ?  ?  ?  ?  ?  ?  ?  ?General Comments: pt motivated despite pain, cautious with movement ?  ?  ?   ?Exercises   ? ?  ?Shoulder Instructions   ? ? ?  ?General Comments VSS on RA  ? ? ?Pertinent Vitals/ Pain       Pain Assessment ?Pain Assessment: Faces ?Pain Score: 6  ?Faces Pain Scale: Hurts even more ?Pain Location: RLE with any movement ?Pain Descriptors / Indicators: Discomfort, Sore, Guarding, Grimacing ?Pain Intervention(s): Limited activity within patient's tolerance, Monitored during session, Repositioned ? ?Home Living   ?  ?  ?  ?  ?  ?  ?  ?  ?  ?  ?  ?  ?  ?  ?  ?  ?  ?  ? ?  ?Prior Functioning/Environment    ?  ?  ?  ?   ? ?Frequency ? Min 2X/week  ? ? ? ? ?  ?Progress Toward Goals ? ?OT Goals(current goals can now be found in the care plan section) ? Progress towards OT goals: Progressing toward goals ? ?Acute Rehab OT Goals ?Patient Stated Goal: to get better ?OT Goal Formulation: With patient ?Time For Goal Achievement: 05/30/21 ?Potential to Achieve Goals: Good ?ADL Goals ?Pt Will Perform Grooming: with min guard assist;standing ?Pt Will Perform Lower Body Dressing: with min assist;with adaptive equipment;sitting/lateral leans ?Pt Will Transfer to Toilet: with min guard assist;stand pivot transfer;bedside commode ?Pt Will Perform Toileting -  Clothing Manipulation and hygiene: with supervision;sitting/lateral leans  ?Plan Discharge plan remains appropriate;Frequency remains appropriate   ? ?Co-evaluation ? ? ?   ?  ?  ?  ?  ? ?  ?AM-PAC OT "6 Clicks" Daily Activity     ?Outcome Measure ? ? Help from another person eating meals?: None ?Help from another person taking care of personal grooming?: None ?Help from another person toileting, which includes using toliet, bedpan, or urinal?: A Lot ?Help from another person bathing (including washing, rinsing, drying)?: A Lot ?Help from another person to put on and taking off regular upper body clothing?: A Little ?Help from another person to put on and taking off regular lower body clothing?: A Lot ?6 Click Score: 17 ? ?  ?End of Session Equipment Utilized During Treatment: Gait belt;Rolling walker (2 wheels) ? ?OT Visit Diagnosis: Unsteadiness on feet (R26.81);Muscle weakness (generalized) (M62.81);History of falling (Z91.81);Pain ?Pain - Right/Left: Right ?Pain - part of body: Leg ?  ?Activity Tolerance Patient tolerated treatment well ?  ?Patient Left in chair;with call bell/phone within reach;with chair alarm set ?  ?Nurse Communication Mobility status ?  ? ?   ? ?Time: [id number] ?  OT Time Calculation (min): 29 min ? ?Charges: OT General Charges ?$OT Visit: 1 Visit ?OT Treatments ?$Self Care/Home Management : 23-37 mins ? ?Lynnda Child, OTD, OTR/L ?Acute Rehab ?[phone] ? ? ?Kaylyn Lim ?05/19/2021, 9:24 AM ?

## 2021-05-19 NOTE — Progress Notes (Signed)
Inpatient Rehab Admissions Coordinator:  ? ?I continue to await insurance auth from the [place] and cannot yet offer a CIR bed. I will follow for potential admit pending insurance auth and bed availability. ? ?Megan Salon, MS, CCC-SLP ?Rehab Admissions Coordinator  ?[phone] (celll) ?[phone] (office) ? ? ?

## 2021-05-19 NOTE — Plan of Care (Signed)
  Problem: Activity: Goal: Risk for activity intolerance will decrease Outcome: Progressing   Problem: Nutrition: Goal: Adequate nutrition will be maintained Outcome: Progressing   Problem: Coping: Goal: Level of anxiety will decrease Outcome: Progressing   Problem: Safety: Goal: Ability to remain free from injury will improve Outcome: Progressing   

## 2021-05-19 NOTE — Progress Notes (Signed)
Inpatient Rehab Admissions Coordinator:  ? ?I received auth from the [place] for CIR; however, I do not have a bed for this Pt. Today. I will follow for potential admit pending bed availability.  ? ?Clemens Catholic, MS, CCC-SLP ?Rehab Admissions Coordinator  ?[phone] (celll) ?[phone] (office) ? ?

## 2021-05-19 NOTE — Progress Notes (Signed)
Physical Therapy Treatment ?Patient Details ?Name: Jack Neal ?MRN: [id number] ?DOB: 05-May-1945 ?Today's Date: 05/19/2021 ? ? ?History of Present Illness Pt is a 76 y.o. male admitted 05/14/21 after falling backwards while trying to pull on his slippers, denies LOC; became hypotensive after imaging requiring transfusion. Pt sustained R intertrochanteric hip fx, R posterior acromial process fx. S/p R hip IMN 3/2. PMH includes PAF, neuropathy, pacemaker, OSA on CPAP, PTSD, depression, chronic pain syndrome. ? ?  ?PT Comments  ? ? Pt received OOB in recliner, agreeable to session, with continued progression towards goals, requiring min a grossly for transfers and gait, with improved tolerance for weight acceptance on RLE. Cues needed for LE sequencing during ambulation with increased distance tolerated. Pt with good recall of all WB and movement precautions. Pt continues to be motivated to return to PLF, continuing to work through high pain levels. Current plan remains appropriate to maximize functional recovery and return to independence. Pt continues to benefit from skilled PT services to progress toward functional mobility goals.  ?  ?Recommendations for follow up therapy are one component of a multi-disciplinary discharge planning process, led by the attending physician.  Recommendations may be updated based on patient status, additional functional criteria and insurance authorization. ? ?Follow Up Recommendations ? Acute inpatient rehab (3hours/day) ?  ?  ?Assistance Recommended at Discharge Intermittent Supervision/Assistance  ?Patient can return home with the following A little help with walking and/or transfers;A little help with bathing/dressing/bathroom;Assistance with cooking/housework;Assist for transportation;Help with stairs or ramp for entrance ?  ?Equipment Recommendations ? None recommended by PT  ?  ?Recommendations for Other Services   ? ? ?  ?Precautions / Restrictions Precautions ?Precautions:  Fall ?Restrictions ?Weight Bearing Restrictions: Yes ?RUE Weight Bearing: Weight bearing as tolerated ?RLE Weight Bearing: Weight bearing as tolerated ?Other Position/Activity Restrictions: RUE shoulder </ 90' flexion and abduction  ?  ? ?Mobility ? Bed Mobility ?  ?  ?  ?  ?  ?  ?  ?General bed mobility comments: pt OOB upon arrival ?  ? ?Transfers ?Overall transfer level: Needs assistance ?Equipment used: Rolling walker (2 wheels) ?Transfers: Sit to/from Stand ?Sit to Stand: Min assist, Mod assist ?  ?  ?  ?  ?  ?General transfer comment: increased time needed, minimal cuing needed for hand placement, min-mod assist for power up from low recliner ?  ? ?Ambulation/Gait ?Ambulation/Gait assistance: Min assist, Mod assist ?Gait Distance (Feet): 18 Feet ?Assistive device: Rolling walker (2 wheels) ?Gait Pattern/deviations: Step-to pattern, Decreased step length - right, Decreased stance time - right, Trunk flexed, Antalgic ?Gait velocity: decr ?  ?Pre-gait activities: standing weight shift R<>L to accept weight on RLE ?General Gait Details: mod assist for verbal and tactile cues for LE sequecing, pt able to advance RLE forward and clear foot at start, with pt sliding RLE forward last few steps secondary to pain and fatigue. ? ? ?Stairs ?  ?  ?  ?  ?  ? ? ?Wheelchair Mobility ?  ? ?Modified Rankin (Stroke Patients Only) ?  ? ? ?  ?Balance Overall balance assessment: Needs assistance ?Sitting-balance support: No upper extremity supported, Feet supported ?Sitting balance-Leahy Scale: Good ?  ?  ?Standing balance support: Reliant on assistive device for balance ?Standing balance-Leahy Scale: Poor ?Standing balance comment: dependent on BUE support ?  ?  ?  ?  ?  ?  ?  ?  ?  ?  ?  ?  ? ?  ?Cognition Arousal/Alertness: Awake/alert ?Behavior  During Therapy: [facility] for tasks assessed/performed ?Overall Cognitive Status: Within Functional Limits for tasks assessed ?  ?  ?  ?  ?  ?  ?  ?  ?  ?  ?  ?  ?  ?  ?  ?  ?General Comments:  pt motivated despite pain, cautious with movement ?  ?  ? ?  ?Exercises   ? ?  ?General Comments General comments (skin integrity, edema, etc.): VSS on RA ?  ?  ? ?Pertinent Vitals/Pain Pain Assessment ?Pain Assessment: Faces ?Faces Pain Scale: Hurts even more ?Pain Location: RLE with any movement ?Pain Descriptors / Indicators: Discomfort, Sore, Guarding, Grimacing ?Pain Intervention(s): Limited activity within patient's tolerance, Monitored during session, Patient requesting pain meds-RN notified  ? ? ?Home Living   ?  ?  ?  ?  ?  ?  ?  ?  ?  ?   ?  ?Prior Function    ?  ?  ?   ? ?PT Goals (current goals can now be found in the care plan section) Acute Rehab PT Goals ?PT Goal Formulation: With patient ?Time For Goal Achievement: 05/30/21 ? ?  ?Frequency ? ? ? Min 5X/week ? ? ? ?  ?PT Plan Current plan remains appropriate  ? ? ?Co-evaluation   ?  ?  ?  ?  ? ?  ?AM-PAC PT "6 Clicks" Mobility   ?Outcome Measure ? Help needed turning from your back to your side while in a flat bed without using bedrails?: A Lot ?Help needed moving from lying on your back to sitting on the side of a flat bed without using bedrails?: A Lot ?Help needed moving to and from a bed to a chair (including a wheelchair)?: A Little ?Help needed standing up from a chair using your arms (e.g., wheelchair or bedside chair)?: A Lot ?Help needed to walk in hospital room?: A Lot ?Help needed climbing 3-5 steps with a railing? : Total ?6 Click Score: 12 ? ?  ?End of Session Equipment Utilized During Treatment: Gait belt ?Activity Tolerance: Patient tolerated treatment well ?Patient left: in chair;with call bell/phone within reach;with chair alarm set;with SCD's reapplied ?Nurse Communication: Mobility status;Other (comment) (pt requesting pain medication) ?PT Visit Diagnosis: Other abnormalities of gait and mobility (R26.89);Muscle weakness (generalized) (M62.81);Pain ?Pain - Right/Left: Right ?Pain - part of body: Leg ?  ? ? ?Time: [id number] ?PT Time  Calculation (min) (ACUTE ONLY): 20 min ? ?Charges:  $Gait Training: 8-22 mins          ?          ? ?Audry Riles. PTA ?Acute Rehabilitation Services ?Office: [phone] ? ? ? ?Betsey Holiday Dustan Hyams ?05/19/2021, 10:02 AM ? ?

## 2021-05-20 ENCOUNTER — Encounter (HOSPITAL_COMMUNITY): Payer: Self-pay | Admitting: Physical Medicine and Rehabilitation

## 2021-05-20 ENCOUNTER — Inpatient Hospital Stay (HOSPITAL_COMMUNITY)
Admission: RE | Admit: 2021-05-20 | Discharge: 2021-06-03 | DRG: 560 | Disposition: A | Discharge status: Home / Self Care | Payer: No Typology Code available for payment source | Source: Intra-hospital | Attending: Physical Medicine and Rehabilitation | Admitting: Physical Medicine and Rehabilitation

## 2021-05-20 ENCOUNTER — Other Ambulatory Visit: Payer: Self-pay

## 2021-05-20 DIAGNOSIS — G4700 Insomnia, unspecified: Secondary | ICD-10-CM | POA: Diagnosis not present

## 2021-05-20 DIAGNOSIS — R42 Dizziness and giddiness: Secondary | ICD-10-CM | POA: Diagnosis not present

## 2021-05-20 DIAGNOSIS — Z79899 Other long term (current) drug therapy: Secondary | ICD-10-CM

## 2021-05-20 DIAGNOSIS — Z9884 Bariatric surgery status: Secondary | ICD-10-CM

## 2021-05-20 DIAGNOSIS — K5900 Constipation, unspecified: Secondary | ICD-10-CM | POA: Diagnosis not present

## 2021-05-20 DIAGNOSIS — M25511 Pain in right shoulder: Secondary | ICD-10-CM | POA: Diagnosis not present

## 2021-05-20 DIAGNOSIS — I480 Paroxysmal atrial fibrillation: Secondary | ICD-10-CM | POA: Diagnosis present

## 2021-05-20 DIAGNOSIS — D62 Acute posthemorrhagic anemia: Secondary | ICD-10-CM | POA: Diagnosis present

## 2021-05-20 DIAGNOSIS — F4312 Post-traumatic stress disorder, chronic: Secondary | ICD-10-CM | POA: Diagnosis present

## 2021-05-20 DIAGNOSIS — I878 Other specified disorders of veins: Secondary | ICD-10-CM | POA: Diagnosis present

## 2021-05-20 DIAGNOSIS — Z95818 Presence of other cardiac implants and grafts: Secondary | ICD-10-CM | POA: Diagnosis not present

## 2021-05-20 DIAGNOSIS — Z888 Allergy status to other drugs, medicaments and biological substances status: Secondary | ICD-10-CM | POA: Diagnosis not present

## 2021-05-20 DIAGNOSIS — G3184 Mild cognitive impairment, so stated: Secondary | ICD-10-CM | POA: Diagnosis not present

## 2021-05-20 DIAGNOSIS — F4001 Agoraphobia with panic disorder: Secondary | ICD-10-CM | POA: Diagnosis present

## 2021-05-20 DIAGNOSIS — M7989 Other specified soft tissue disorders: Secondary | ICD-10-CM | POA: Diagnosis not present

## 2021-05-20 DIAGNOSIS — G894 Chronic pain syndrome: Secondary | ICD-10-CM | POA: Diagnosis present

## 2021-05-20 DIAGNOSIS — K8689 Other specified diseases of pancreas: Secondary | ICD-10-CM | POA: Diagnosis present

## 2021-05-20 DIAGNOSIS — Z720 Tobacco use: Secondary | ICD-10-CM

## 2021-05-20 DIAGNOSIS — S72141A Displaced intertrochanteric fracture of right femur, initial encounter for closed fracture: Secondary | ICD-10-CM | POA: Diagnosis present

## 2021-05-20 DIAGNOSIS — Z7902 Long term (current) use of antithrombotics/antiplatelets: Secondary | ICD-10-CM

## 2021-05-20 DIAGNOSIS — F329 Major depressive disorder, single episode, unspecified: Secondary | ICD-10-CM | POA: Diagnosis present

## 2021-05-20 DIAGNOSIS — W1830XD Fall on same level, unspecified, subsequent encounter: Secondary | ICD-10-CM | POA: Diagnosis not present

## 2021-05-20 DIAGNOSIS — Z8249 Family history of ischemic heart disease and other diseases of the circulatory system: Secondary | ICD-10-CM

## 2021-05-20 DIAGNOSIS — K5901 Slow transit constipation: Secondary | ICD-10-CM | POA: Diagnosis not present

## 2021-05-20 DIAGNOSIS — G629 Polyneuropathy, unspecified: Secondary | ICD-10-CM | POA: Diagnosis present

## 2021-05-20 DIAGNOSIS — Z880 Allergy status to penicillin: Secondary | ICD-10-CM

## 2021-05-20 DIAGNOSIS — I10 Essential (primary) hypertension: Secondary | ICD-10-CM | POA: Diagnosis present

## 2021-05-20 DIAGNOSIS — Z7982 Long term (current) use of aspirin: Secondary | ICD-10-CM

## 2021-05-20 DIAGNOSIS — G4733 Obstructive sleep apnea (adult) (pediatric): Secondary | ICD-10-CM | POA: Diagnosis present

## 2021-05-20 DIAGNOSIS — S72141D Displaced intertrochanteric fracture of right femur, subsequent encounter for closed fracture with routine healing: Secondary | ICD-10-CM | POA: Diagnosis present

## 2021-05-20 DIAGNOSIS — K219 Gastro-esophageal reflux disease without esophagitis: Secondary | ICD-10-CM | POA: Diagnosis present

## 2021-05-20 DIAGNOSIS — Z79891 Long term (current) use of opiate analgesic: Secondary | ICD-10-CM

## 2021-05-20 DIAGNOSIS — Z950 Presence of cardiac pacemaker: Secondary | ICD-10-CM

## 2021-05-20 DIAGNOSIS — Z91018 Allergy to other foods: Secondary | ICD-10-CM

## 2021-05-20 LAB — CBC
HCT: 26.3 % — ABNORMAL LOW (ref 39.0–52.0)
Hemoglobin: 8.2 g/dL — ABNORMAL LOW (ref 13.0–17.0)
MCH: 31.3 pg (ref 26.0–34.0)
MCHC: 31.2 g/dL (ref 30.0–36.0)
MCV: 100.4 fL — ABNORMAL HIGH (ref 80.0–100.0)
Platelets: 149 10*3/uL — ABNORMAL LOW (ref 150–400)
RBC: 2.62 MIL/uL — ABNORMAL LOW (ref 4.22–5.81)
RDW: 16.3 % — ABNORMAL HIGH (ref 11.5–15.5)
WBC: 3.9 10*3/uL — ABNORMAL LOW (ref 4.0–10.5)
nRBC: 0.8 % — ABNORMAL HIGH (ref 0.0–0.2)

## 2021-05-20 LAB — BASIC METABOLIC PANEL
Anion gap: 6 (ref 5–15)
BUN: 23 mg/dL (ref 8–23)
CO2: 34 mmol/L — ABNORMAL HIGH (ref 22–32)
Calcium: 8.5 mg/dL — ABNORMAL LOW (ref 8.9–10.3)
Chloride: 96 mmol/L — ABNORMAL LOW (ref 98–111)
Creatinine, Ser: 0.65 mg/dL (ref 0.61–1.24)
GFR, Estimated: >60 mL/min (ref >60)
Glucose, Bld: 101 mg/dL — ABNORMAL HIGH (ref 70–99)
Potassium: 3.7 mmol/L (ref 3.5–5.1)
Sodium: 136 mmol/L (ref 135–145)

## 2021-05-20 MED ORDER — DULOXETINE HCL 60 MG PO CPEP: 60.0000 mg | ORAL_CAPSULE | Freq: Every day | ORAL | Status: DC

## 2021-05-20 MED ORDER — ASPIRIN EC 81 MG PO TBEC
81.0000 mg | DELAYED_RELEASE_TABLET | Freq: Every day | ORAL | Status: DC
  Administered 2021-05-21 – 2021-06-03 (×14): 81 mg via ORAL
  Filled 2021-05-20 (×16): qty 1

## 2021-05-20 MED ORDER — DOXEPIN HCL 10 MG PO CAPS
10.0000 mg | ORAL_CAPSULE | Freq: Every day | ORAL | Status: DC
  Administered 2021-05-21 – 2021-06-02 (×13): 10 mg via ORAL
  Filled 2021-05-20 (×18): qty 1

## 2021-05-20 MED ORDER — POLYETHYLENE GLYCOL 3350 17 G PO PACK: 17.0000 g | PACK | Freq: Every day | ORAL | Status: DC | PRN

## 2021-05-20 MED ORDER — CLOPIDOGREL BISULFATE 75 MG PO TABS
75.0000 mg | ORAL_TABLET | Freq: Every day | ORAL | Status: DC
Start: 2021-05-21 — End: 2021-06-03
  Administered 2021-05-21 – 2021-06-03 (×14): 75 mg via ORAL
  Filled 2021-05-20 (×14): qty 1

## 2021-05-20 MED ORDER — MEMANTINE HCL 10 MG PO TABS
10.0000 mg | ORAL_TABLET | Freq: Every day | ORAL | Status: DC
  Administered 2021-05-21 – 2021-05-30 (×10): 10 mg via ORAL
  Filled 2021-05-20 (×11): qty 1

## 2021-05-20 MED ORDER — POLYETHYLENE GLYCOL 3350 17 G PO PACK
17.0000 g | PACK | Freq: Every day | ORAL | Status: DC
  Administered 2021-05-20: 17 g via ORAL
  Filled 2021-05-20: qty 1

## 2021-05-20 MED ORDER — POLYVINYL ALCOHOL 1.4 % OP SOLN: 1.0000 [drp] | Freq: Three times a day (TID) | OPHTHALMIC | Status: DC | PRN

## 2021-05-20 MED ORDER — JUVEN PO PACK
1.0000 | PACK | Freq: Two times a day (BID) | ORAL | Status: DC
  Administered 2021-05-20 – 2021-06-03 (×25): 1 via ORAL
  Filled 2021-05-20 (×25): qty 1

## 2021-05-20 MED ORDER — METHOCARBAMOL 1000 MG/10ML IJ SOLN
500.0000 mg | Freq: Four times a day (QID) | INTRAVENOUS | Status: DC | PRN
  Filled 2021-05-20: qty 5

## 2021-05-20 MED ORDER — FERROUS SULFATE 325 (65 FE) MG PO TABS
325.0000 mg | ORAL_TABLET | ORAL | Status: DC
  Administered 2021-05-21 – 2021-06-02 (×6): 325 mg via ORAL
  Filled 2021-05-20 (×7): qty 1

## 2021-05-20 MED ORDER — ASCORBIC ACID 500 MG PO TABS
250.0000 mg | ORAL_TABLET | Freq: Two times a day (BID) | ORAL | Status: DC
  Administered 2021-05-20 – 2021-06-03 (×28): 250 mg via ORAL
  Filled 2021-05-20 (×28): qty 1

## 2021-05-20 MED ORDER — RISAQUAD PO CAPS
1.0000 | ORAL_CAPSULE | Freq: Every day | ORAL | Status: DC
  Administered 2021-05-21 – 2021-06-03 (×14): 1 via ORAL
  Filled 2021-05-20 (×14): qty 1

## 2021-05-20 MED ORDER — PANTOPRAZOLE SODIUM 40 MG PO TBEC
40.0000 mg | DELAYED_RELEASE_TABLET | Freq: Every day | ORAL | Status: DC
  Administered 2021-05-20 – 2021-06-02 (×14): 40 mg via ORAL
  Filled 2021-05-20 (×15): qty 1

## 2021-05-20 MED ORDER — MEMANTINE HCL 10 MG PO TABS
20.0000 mg | ORAL_TABLET | Freq: Every day | ORAL | Status: DC
Start: 2021-05-20 — End: 2021-05-31
  Administered 2021-05-20 – 2021-05-30 (×11): 20 mg via ORAL
  Filled 2021-05-20 (×12): qty 2

## 2021-05-20 MED ORDER — FOLIC ACID 1 MG PO TABS
1.0000 mg | ORAL_TABLET | Freq: Every day | ORAL | Status: DC
  Administered 2021-05-21 – 2021-06-03 (×14): 1 mg via ORAL
  Filled 2021-05-20 (×14): qty 1

## 2021-05-20 MED ORDER — MEMANTINE HCL 10 MG PO TABS: 10.0000 mg | ORAL_TABLET | Freq: Every day | ORAL | Status: DC

## 2021-05-20 MED ORDER — BISACODYL 10 MG RE SUPP: 10.0000 mg | Freq: Every day | RECTAL | Status: DC | PRN

## 2021-05-20 MED ORDER — BUPRENORPHINE HCL-NALOXONE HCL 2-0.5 MG SL SUBL
1.0000 | SUBLINGUAL_TABLET | Freq: Every day | SUBLINGUAL | Status: DC
  Administered 2021-05-21 – 2021-06-03 (×14): 1 via SUBLINGUAL
  Filled 2021-05-20 (×14): qty 1

## 2021-05-20 MED ORDER — DICLOFENAC SODIUM 1 % EX GEL: 2.0000 g | Freq: Three times a day (TID) | CUTANEOUS | Status: DC | PRN

## 2021-05-20 MED ORDER — CLOPIDOGREL BISULFATE 75 MG PO TABS: 75.0000 mg | ORAL_TABLET | Freq: Every day | ORAL | Status: DC

## 2021-05-20 MED ORDER — SALINE SPRAY 0.65 % NA SOLN
1.0000 | Freq: Every day | NASAL | Status: DC
  Administered 2021-05-21 – 2021-06-02 (×11): 1 via NASAL
  Filled 2021-05-20: qty 44

## 2021-05-20 MED ORDER — VITAMIN D 25 MCG (1000 UNIT) PO TABS
2000.0000 [IU] | ORAL_TABLET | Freq: Two times a day (BID) | ORAL | Status: DC
  Administered 2021-05-20 – 2021-06-03 (×28): 2000 [IU] via ORAL
  Filled 2021-05-20 (×28): qty 2

## 2021-05-20 MED ORDER — ADULT MULTIVITAMIN W/MINERALS CH
1.0000 | ORAL_TABLET | Freq: Every day | ORAL | Status: DC
  Administered 2021-05-21 – 2021-06-03 (×14): 1 via ORAL
  Filled 2021-05-20 (×14): qty 1

## 2021-05-20 MED ORDER — HYDROXYZINE HCL 10 MG PO TABS
20.0000 mg | ORAL_TABLET | Freq: Every day | ORAL | Status: DC
  Administered 2021-05-20 – 2021-06-02 (×14): 20 mg via ORAL
  Filled 2021-05-20 (×14): qty 2

## 2021-05-20 MED ORDER — SENNOSIDES-DOCUSATE SODIUM 8.6-50 MG PO TABS: 2.0000 | ORAL_TABLET | Freq: Every day | ORAL | Status: DC

## 2021-05-20 MED ORDER — METHOCARBAMOL 500 MG PO TABS
500.0000 mg | ORAL_TABLET | Freq: Four times a day (QID) | ORAL | Status: DC | PRN
  Administered 2021-05-22 – 2021-05-24 (×4): 500 mg via ORAL
  Filled 2021-05-20 (×5): qty 1

## 2021-05-20 MED ORDER — RISAQUAD PO CAPS: 1.0000 | ORAL_CAPSULE | Freq: Every day | ORAL | Status: DC

## 2021-05-20 MED ORDER — ONDANSETRON HCL 4 MG/2ML IJ SOLN: 4.0000 mg | Freq: Four times a day (QID) | INTRAMUSCULAR | Status: DC | PRN

## 2021-05-20 MED ORDER — ACETAMINOPHEN 325 MG PO TABS
325.0000 mg | ORAL_TABLET | Freq: Four times a day (QID) | ORAL | Status: DC | PRN
  Administered 2021-05-21 – 2021-06-03 (×15): 650 mg via ORAL
  Filled 2021-05-20 (×17): qty 2

## 2021-05-20 MED ORDER — ONDANSETRON HCL 4 MG PO TABS
4.0000 mg | ORAL_TABLET | Freq: Four times a day (QID) | ORAL | Status: DC | PRN
Start: 2021-05-20 — End: 2021-06-03

## 2021-05-20 MED ORDER — PANCRELIPASE (LIP-PROT-AMYL) 36000-114000 UNITS PO CPEP
36000.0000 [IU] | ORAL_CAPSULE | Freq: Three times a day (TID) | ORAL | Status: DC
  Administered 2021-05-20 – 2021-06-03 (×42): 36000 [IU] via ORAL
  Filled 2021-05-20 (×43): qty 1

## 2021-05-20 MED ORDER — BISACODYL 10 MG RE SUPP
10.0000 mg | Freq: Every day | RECTAL | Status: DC | PRN
  Administered 2021-05-26 – 2021-05-27 (×2): 10 mg via RECTAL
  Filled 2021-05-20 (×2): qty 1

## 2021-05-20 MED ORDER — HYDROCODONE-ACETAMINOPHEN 5-325 MG PO TABS
1.0000 | ORAL_TABLET | ORAL | Status: DC | PRN
  Administered 2021-05-20: 2 via ORAL
  Administered 2021-05-20 – 2021-05-21 (×2): 1 via ORAL
  Administered 2021-05-21: 2 via ORAL
  Filled 2021-05-20: qty 2
  Filled 2021-05-20 (×2): qty 1
  Filled 2021-05-20: qty 2

## 2021-05-20 MED ORDER — CALCIUM CARBONATE ANTACID 500 MG PO CHEW
1000.0000 mg | CHEWABLE_TABLET | Freq: Every day | ORAL | Status: DC | PRN
  Administered 2021-05-22 – 2021-05-27 (×4): 1000 mg via ORAL
  Filled 2021-05-20 (×4): qty 5

## 2021-05-20 MED ORDER — SIMETHICONE 80 MG PO CHEW
160.0000 mg | CHEWABLE_TABLET | Freq: Four times a day (QID) | ORAL | Status: DC | PRN
  Administered 2021-05-23: 160 mg via ORAL
  Filled 2021-05-20 (×2): qty 2

## 2021-05-20 MED ORDER — VITAMIN B-12 100 MCG PO TABS
50.0000 ug | ORAL_TABLET | Freq: Every day | ORAL | Status: DC
Start: 2021-05-20 — End: 2021-05-20
  Filled 2021-05-20: qty 1

## 2021-05-20 MED ORDER — FOLIC ACID 1 MG PO TABS: 1.0000 mg | ORAL_TABLET | Freq: Every day | ORAL | Status: DC

## 2021-05-20 MED ORDER — GALANTAMINE HYDROBROMIDE ER 8 MG PO CP24
16.0000 mg | ORAL_CAPSULE | Freq: Every day | ORAL | Status: DC
  Administered 2021-05-21 – 2021-06-02 (×13): 16 mg via ORAL
  Filled 2021-05-20 (×17): qty 2

## 2021-05-20 MED ORDER — MEMANTINE HCL 10 MG PO TABS: 20.0000 mg | ORAL_TABLET | Freq: Every day | ORAL | Status: DC

## 2021-05-20 MED ORDER — DULOXETINE HCL 60 MG PO CPEP
60.0000 mg | ORAL_CAPSULE | Freq: Every day | ORAL | Status: DC
Start: 2021-05-21 — End: 2021-06-03
  Administered 2021-05-21 – 2021-06-03 (×14): 60 mg via ORAL
  Filled 2021-05-20 (×16): qty 1

## 2021-05-20 MED ORDER — ZINC SULFATE 220 (50 ZN) MG PO CAPS
220.0000 mg | ORAL_CAPSULE | Freq: Every day | ORAL | Status: DC
  Administered 2021-05-21 – 2021-06-03 (×14): 220 mg via ORAL
  Filled 2021-05-20 (×15): qty 1

## 2021-05-20 MED ORDER — CALCIUM CARBONATE 1250 (500 CA) MG PO TABS
1.0000 | ORAL_TABLET | Freq: Three times a day (TID) | ORAL | Status: DC
  Administered 2021-05-20 – 2021-06-03 (×42): 500 mg via ORAL
  Filled 2021-05-20 (×44): qty 1

## 2021-05-20 MED ORDER — ENOXAPARIN SODIUM 30 MG/0.3ML IJ SOSY
30.0000 mg | PREFILLED_SYRINGE | Freq: Two times a day (BID) | INTRAMUSCULAR | Status: DC
  Administered 2021-05-20 – 2021-06-03 (×28): 30 mg via SUBCUTANEOUS
  Filled 2021-05-20 (×28): qty 0.3

## 2021-05-20 MED ORDER — DULOXETINE HCL 30 MG PO CPEP
30.0000 mg | ORAL_CAPSULE | Freq: Every day | ORAL | Status: DC
  Administered 2021-05-20 – 2021-06-02 (×14): 30 mg via ORAL
  Filled 2021-05-20 (×14): qty 1

## 2021-05-20 MED ORDER — VITAMIN B-12 100 MCG PO TABS
50.0000 ug | ORAL_TABLET | Freq: Every day | ORAL | Status: DC
  Administered 2021-05-21 – 2021-06-03 (×14): 50 ug via ORAL
  Filled 2021-05-20 (×14): qty 1

## 2021-05-20 MED ORDER — ADULT MULTIVITAMIN W/MINERALS CH: 1.0000 | ORAL_TABLET | Freq: Every day | ORAL | Status: DC

## 2021-05-20 MED ORDER — DULOXETINE HCL 30 MG PO CPEP: 30.0000 mg | ORAL_CAPSULE | Freq: Every day | ORAL | Status: DC

## 2021-05-20 MED ORDER — ENOXAPARIN SODIUM 30 MG/0.3ML IJ SOSY: 30.0000 mg | PREFILLED_SYRINGE | Freq: Two times a day (BID) | INTRAMUSCULAR | Status: DC

## 2021-05-20 MED ORDER — ZINC SULFATE 220 (50 ZN) MG PO CAPS: 220.0000 mg | ORAL_CAPSULE | Freq: Every day | ORAL | Status: DC

## 2021-05-20 MED ORDER — FLUTICASONE PROPIONATE 50 MCG/ACT NA SUSP
2.0000 | Freq: Every day | NASAL | Status: DC | PRN
  Filled 2021-05-20: qty 16

## 2021-05-20 NOTE — Progress Notes (Signed)
PMR Admission Coordinator Pre-Admission Assessment   Patient: Jack Neal is an 76 y.o., male MRN: [id number] DOB: Feb 05, 1946 Height: _0  (180.3 cm) Weight: 77.1 kg   Insurance Information HMO:     PPO:      PCP:      IPA:      80/20:      OTHER:  PRIMARY: [facility]      Policy#: [id number]      Subscriber: Pt  CM Name: Sherlynn Carbon       Phone#: [phone]     Fax#: [phone] I received approval for 30 days from [facility] the [place] on 08/17/99 Pre-Cert#:  [id number]     Employer:  Benefits:  Phone #:      Name:  Irene Shipper Date: 08/14/2017 - still active Deductible: $0 OOP Max: $0 As long as Referral from ER was received the following will be true:  CIR: 100% coverage, auth required SNF: 100% coverage, auth required Outpatient:  100% coverage, auth required Home Health:  100% coverage, auth required DME: 100% coverage, auth required Providers: In network   SECONDARY: Medicare A and B       Policy#: [id number]     Phone#:    Financial Counselor:       Phone#:    The Actuary for patients in Inpatient Rehabilitation Facilities with attached Privacy Act [place] Records was provided and verbally reviewed with: Patient   Emergency Contact Information Contact Information       Name Relation Home Work Mobile    [place] Spouse [phone]   [phone]    ADOLFO, GRANIERI Daughter [phone]           Current Medical History  Patient Admitting Diagnosis: Hip fx  History of Present Illness: Jack Neal is a 76 year old right-handed male with history of PAF/pacemaker maintained on aspirin and Plavix, 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  Per chart review patient lives with spouse.  1 level home.  Wife can provide very little physical assistance.  Patient was independent with a rollator prior to admission.  He does not drive.  Presented 05/14/2021 after mechanical fall without loss of consciousness landing on his right side.  Cranial CT scan negative.  CT cervical spine negative.  CT chest abdomen pelvis showed acute minimally displaced fracture of the right acromion.  Acute comminuted angulated intertrochanteric fracture of the right hip with moderate surrounding intramuscular and interstitial hemorrhage.  Patient underwent open treatment of right intertrochanteric femur fracture with intramedullary implant with interlocking screws 05/15/2021 per Dr. Mable Fill.  Patient is weightbearing as tolerated.  Conservative care of right acromion fracture limited abduction and forward flexion to 90 degrees due to fracture.  Placed on Lovenox for DVT prophylaxis.  He remains on aspirin and Plavix as prior to admission.  Acute blood loss anemia hemoglobin 8.2.  Transfused 1 unit packed red blood cells 3/4.  Therapy evaluations completed due to patient decreased functional mobility was admitted for a comprehensive rehab program.   Patient's medical [facility] has been reviewed by the rehabilitation admission coordinator and physician.   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 Yes   Family History   family history includes Brain cancer in his sister; Heart attack in his mother; Ulcers in his father.   Current Medications   Current Facility-Administered Medications:    acetaminophen (TYLENOL) tablet 325-650 mg, 325-650 mg, Oral, Q6H PRN, Georgeanna Harrison, MD, 650 mg at 05/18/21 1002   acidophilus (RISAQUAD) capsule 1 capsule, 1 capsule, Oral, Daily, Mansy, Jan A, MD, 1 capsule at 05/18/21 1001   ascorbic acid (VITAMIN C) tablet 250 mg, 250 mg, Oral, BID, Mansy, Jan A, MD, 250 mg at 05/17/21 2110   aspirin EC tablet 81 mg, 81 mg, Oral, Daily, Bonnielee Haff, MD, 81 mg at 05/18/21 1004   bisacodyl (DULCOLAX) suppository 10 mg, 10 mg, Rectal, Daily PRN, Bonnielee Haff, MD   bisacodyl (DULCOLAX) suppository 10 mg, 10 mg, Rectal, Once, Bonnielee Haff, MD   buprenorphine-naloxone (SUBOXONE) 2-0.5 mg per SL tablet 1 tablet, 1 tablet, Sublingual, Daily, Bonnielee Haff, MD, 1 tablet at 05/17/21 1032   [START ON 05/19/2021] buprenorphine-naloxone (SUBOXONE) 2-0.5 mg per SL tablet 1 tablet, 1 tablet, Sublingual, Daily, Bonnielee Haff, MD   calcium carbonate (OS-CAL - dosed in mg of elemental calcium) tablet 500 mg of elemental calcium, 1 tablet, Oral, TID WC, Bonnielee Haff, MD, 500 mg of elemental calcium at 05/18/21 1004   calcium carbonate (TUMS - dosed in mg elemental calcium) chewable tablet 1,000 mg, 1,000 mg, Oral, Daily PRN, Mansy, Jan A, MD, 1,000 mg at 05/18/21 0126   cholecalciferol (VITAMIN D3) tablet 2,000 Units, 2,000 Units, Oral, BID, Mansy, Jan A, MD, 2,000 Units at 05/17/21 2110   diclofenac Sodium (VOLTAREN) 1 % topical gel 2 g, 2 g, Topical, TID PRN, Bonnielee Haff, MD   doxepin (SINEQUAN) capsule 10 mg, 10 mg, Oral, QHS, Mansy, Jan A, MD, 10 mg at 05/17/21 2111   DULoxetine  (CYMBALTA) DR capsule 60 mg, 60 mg, Oral, Daily, 60 mg at 05/18/21 1005 **AND** DULoxetine (CYMBALTA) DR capsule 30 mg, 30 mg, Oral, QHS, Mansy, Jan A, MD, 30 mg at 05/17/21 2110   enoxaparin (LOVENOX) injection 30 mg, 30 mg, Subcutaneous, Q12H, Georgeanna Harrison, MD, 30 mg at 05/17/21 2114   ferrous sulfate tablet 325 mg, 325 mg, Oral, Q M,W,F, Mansy, Jan A, MD, 325 mg at 05/16/21 0841   fluticasone (FLONASE) 50 MCG/ACT nasal spray 2 spray, 2 spray, Each Nare, Daily PRN, Mansy, Jan A, MD   folic acid (FOLVITE) tablet 1 mg, 1 mg, Oral, Daily, Mansy, Jan A, MD, 1 mg at 05/18/21 1005   galantamine (RAZADYNE ER) 24 hr capsule 16 mg, 16 mg, Oral, QHS, Mansy, Jan A, MD, 16 mg at 05/17/21 2110   HYDROcodone-acetaminophen (NORCO) 7.5-325 MG per tablet 1-2 tablet, 1-2 tablet, Oral, Q4H PRN, Georgeanna Harrison, MD, 2 tablet at 05/18/21 0300   HYDROcodone-acetaminophen (  NORCO/VICODIN) 5-325 MG per tablet 1-2 tablet, 1-2 tablet, Oral, Q4H PRN, Georgeanna Harrison, MD, 2 tablet at 05/17/21 1444   hydrOXYzine (ATARAX) tablet 20 mg, 20 mg, Oral, QHS, Mansy, Jan A, MD, 20 mg at 05/17/21 2111   lipase/protease/amylase (CREON) capsule 36,000 Units, 36,000 Units, Oral, TID WC, Mansy, Arvella Merles, MD, 36,000 Units at 05/18/21 1002   magnesium hydroxide (MILK OF MAGNESIA) suspension 30 mL, 30 mL, Oral, Daily PRN, Mansy, Jan A, MD   memantine [facility]) tablet 10 mg, 10 mg, Oral, Daily, 10 mg at 05/18/21 0959 **AND** memantine (NAMENDA) tablet 20 mg, 20 mg, Oral, QHS, Mansy, Jan A, MD, 20 mg at 05/17/21 2111   menthol-cetylpyridinium (CEPACOL) lozenge 3 mg, 1 lozenge, Oral, PRN **OR** phenol (CHLORASEPTIC) mouth spray 1 spray, 1 spray, Mouth/Throat, PRN, Georgeanna Harrison, MD   methocarbamol (ROBAXIN) tablet 500 mg, 500 mg, Oral, Q6H PRN **OR** methocarbamol (ROBAXIN) 500 mg in dextrose 5 % 50 mL IVPB, 500 mg, Intravenous, Q6H PRN, Mansy, Jan A, MD   metoCLOPramide (REGLAN) tablet 5-10 mg, 5-10 mg, Oral, Q8H PRN **OR** metoCLOPramide (REGLAN)  injection 5-10 mg, 5-10 mg, Intravenous, Q8H PRN, Georgeanna Harrison, MD   morphine (PF) 2 MG/ML injection 0.5-1 mg, 0.5-1 mg, Intravenous, Q2H PRN, Georgeanna Harrison, MD, 1 mg at 05/18/21 0448   multivitamin with minerals tablet 1 tablet, 1 tablet, Oral, Daily, Mansy, Jan A, MD, 1 tablet at 05/18/21 1005   nutrition supplement (JUVEN) (JUVEN) powder packet 1 packet, 1 packet, Oral, BID BM, Bonnielee Haff, MD, 1 packet at 05/18/21 0958   ondansetron (ZOFRAN) tablet 4 mg, 4 mg, Oral, Q6H PRN **OR** ondansetron (ZOFRAN) injection 4 mg, 4 mg, Intravenous, Q6H PRN, Georgeanna Harrison, MD   pantoprazole (PROTONIX) EC tablet 40 mg, 40 mg, Oral, QHS, Bonnielee Haff, MD   polyethylene glycol (MIRALAX / GLYCOLAX) packet 17 g, 17 g, Oral, BID, Bonnielee Haff, MD, 17 g at 05/18/21 2703   polyvinyl alcohol (LIQUIFILM TEARS) 1.4 % ophthalmic solution 1 drop, 1 drop, Both Eyes, TID PRN, Bonnielee Haff, MD   senna-docusate (Senokot-S) tablet 2 tablet, 2 tablet, Oral, BID, Bonnielee Haff, MD, 2 tablet at 05/18/21 1003   simethicone (MYLICON) chewable tablet 160 mg, 160 mg, Oral, QID PRN, Mansy, Jan A, MD   sodium chloride (OCEAN) 0.65 % nasal spray 1 spray, 1 spray, Each Nare, QHS, Mansy, Jan A, MD, 1 spray at 05/17/21 2116   vitamin B-12 (CYANOCOBALAMIN) tablet 50 mcg, 50 mcg, Oral, Daily, Mansy, Jan A, MD, 50 mcg at 05/18/21 1000   zinc sulfate capsule 220 mg, 220 mg, Oral, Daily, Mansy, Jan A, MD, 220 mg at 05/18/21 1005   Patients Current Diet:  Diet Order                  Diet regular Room service appropriate? Yes; Fluid consistency: Thin  Diet effective now                         Precautions / Restrictions Precautions Precautions: Fall Restrictions Weight Bearing Restrictions: Yes RUE Weight Bearing: Weight bearing as tolerated RLE Weight Bearing: Weight bearing as tolerated Other Position/Activity Restrictions: RUE shoulder </ 90' flexion and abduction    Has the patient had 2 or more falls or  a fall with injury in the past year? Yes   Prior Activity Level Community (5-7x/wk): Pt. as active in the community PTA   Prior Functional Level Self Care: Did the patient need help bathing, dressing, using  the toilet or eating? Independent   Indoor Mobility: Did the patient need assistance with walking from room to room (with or without device)? Independent   Stairs: Did the patient need assistance with internal or external stairs (with or without device)? Independent   Functional Cognition: Did the patient need help planning regular tasks such as shopping or remembering to take medications? Independent   Patient Information Are you of Hispanic, Latino/a,or Spanish origin?: A. No, not of Hispanic, Latino/a, or Spanish origin What is your race?: A. White Do you need or want an interpreter to communicate with a doctor or health care staff?: 0. No   Patient's Response To:  Health Literacy and Transportation Is the patient able to respond to health literacy and transportation needs?: Yes Health Literacy - How often do you need to have someone help you when you read instructions, pamphlets, or other written material from your doctor or pharmacy?: Never In the past 12 months, has lack of transportation kept you from medical appointments or from getting medications?: Yes In the past 12 months, has lack of transportation kept you from meetings, work, or from getting things needed for daily living?: No   Development worker, international aid / [place] Devices/Equipment: Blood pressure cuff, Environmental consultant (specify type), Wheelchair, Wellsite geologist, Cabin crew, Teacher, adult education, Radio producer (specify quad or straight) Home Equipment: Rollator (4 wheels), Tub bench, Transport chair, Cane - single point   Prior Device Use: Indicate devices/aids used by the patient prior to current illness, exacerbation or injury? None of the above   Current Functional Level Cognition   Overall Cognitive Status: Within Functional  Limits for tasks assessed Orientation Level: Oriented X4 General Comments: able to state needs and problem solve safe movement. highly motivated    Extremity Assessment (includes Sensation/Coordination)   Upper Extremity Assessment: Overall WFL for tasks assessed, RUE deficits/detail RUE Deficits / Details: restricted AROM RUE Sensation: WNL RUE Coordination: WNL  Lower Extremity Assessment: Defer to PT evaluation RLE Deficits / Details: s/p R hip IMN; knee ext <3/5 but able to achieve partial range LAQ, hip <3/5 with expected post-op pain and weakness RLE Sensation: history of peripheral neuropathy RLE Coordination: decreased gross motor     ADLs   Overall ADL's : Needs assistance/impaired Eating/Feeding: Independent, Sitting Grooming: Set up, Sitting Upper Body Bathing: Set up, Sitting Lower Body Bathing: Maximal assistance, Sitting/lateral leans Upper Body Dressing : Minimal assistance, Sitting Lower Body Dressing: Maximal assistance, Sitting/lateral leans Toilet Transfer: Moderate assistance, BSC/3in1, Stand-pivot     Mobility   Overal bed mobility: Needs Assistance Bed Mobility: Supine to Sit Supine to sit: Mod assist General bed mobility comments: modA to move RLE and to complete trunk movement     Transfers   Overall transfer level: Needs assistance Equipment used: Rolling walker (2 wheels) Transfers: Sit to/from Stand, Bed to chair/wheelchair/BSC Sit to Stand: Min assist, From elevated surface, Mod assist Bed to/from chair/wheelchair/BSC transfer type:: Step pivot Step pivot transfers: Mod assist General transfer comment: increased time and effort to power up, pt with significant pain but completing with only minA. small steps to [facility] with minimal wt throuh RLE. modA to rise from lower BSC     Ambulation / Gait / Stairs / Wheelchair Mobility   Ambulation/Gait General Gait Details: pt unable to wt bear on R hip enough to step with LLE, dependent on heel-toe pivotal  movements at this time     Posture / Balance Balance Overall balance assessment: Needs assistance Sitting-balance support: No upper extremity  supported, Feet supported Sitting balance-Leahy Scale: Good Standing balance support: Reliant on assistive device for balance Standing balance-Leahy Scale: Poor Standing balance comment: dependent on BUE support     Special needs/care consideration Skin surgical incision     Previous Home Environment (from acute therapy documentation) Living Arrangements: Spouse/significant other  Lives With: Spouse Available Help at Discharge: Family, Available 24 hours/day Type of Home: House Home Layout: One level Home Access: Level entry Bathroom Shower/Tub: Chiropodist: Standard Bathroom Accessibility: Yes How Accessible: Accessible via walker Home Care Services: No Additional Comments: reports wife not able to physically assist with mobility, but could help some with ADLs   Discharge Living Setting Plans for Discharge Living Setting: Patient's home Type of Home at Discharge: House Discharge Home Layout: One level Discharge Home Access: Level entry Discharge Bathroom Shower/Tub: Tub/shower unit Discharge Bathroom Toilet: Standard Discharge Bathroom Accessibility: Yes How Accessible: Accessible via walker, Accessible via wheelchair Does the patient have any problems obtaining your medications?: No   Social/Family/Support Systems Patient Roles: Spouse Contact Information: [phone] Anticipated Caregiver: Cletis [place] Ability/Limitations of Caregiver: Can provide superviision Caregiver Availability: 24/7 Discharge Plan Discussed with Primary Caregiver: Yes Is Caregiver In Agreement with Plan?: Yes Does Caregiver/Family have Issues with Lodging/Transportation while Pt is in Rehab?: No   Goals Patient/Family Goal for Rehab: PT/OT Supervisoin Expected length of stay: 12-14  days Pt/Family Agrees to Admission and willing  to participate: Yes Program Orientation Provided & Reviewed with Pt/Caregiver Including Roles  & Responsibilities: Yes   Decrease burden of Care through IP rehab admission: Specialzed equipment needs, Decrease number of caregivers, and Patient/family education   Possible need for SNF placement upon discharge: not anticipated    Patient Condition: I have reviewed medical records from [facility] , spoken with CM, and patient. I met with patient at the bedside for inpatient rehabilitation assessment.  Patient will benefit from ongoing PT and OT, can actively participate in 3 hours of therapy a day 5 days of the week, and can make measurable gains during the admission.  Patient will also benefit from the coordinated team approach during an Inpatient Acute Rehabilitation admission.  The patient will receive intensive therapy as well as Rehabilitation physician, nursing, social worker, and care management interventions.  Due to safety, disease management, medication administration, pain management, and patient education the patient requires 24 hour a day rehabilitation nursing.  The patient is currently min A with mobility and basic ADLs.  Discharge setting and therapy post discharge at home with home health is anticipated.  Patient has agreed to participate in the Acute Inpatient Rehabilitation Program and will admit today.   Preadmission Screen Completed By:  Genella Mech, 05/18/2021 10:06 AM ______________________________________________________________________   Discussed status with Dr. Dagoberto Ligas  on 05/20/21  at 59 and received approval for admission today.   Admission Coordinator:  Genella Mech, CCC-SLP, time 1030/Date 05/20/21    Assessment/Plan: Diagnosis: Does the need for close, 24 hr/day Medical supervision in concert with the patient's rehab needs make it unreasonable for this patient to be served in a less intensive setting? Yes Co-Morbidities requiring supervision/potential  complications: Afib, watchman device, R hip fx s/p IM nail,a criomial fx, dementia, suboxone for pain Due to bladder management, bowel management, safety, skin/wound care, disease management, medication administration, pain management, and patient education, does the patient require 24 hr/day rehab nursing? Yes Does the patient require coordinated care of a physician, rehab nurse, PT, OT, and SLP to address physical  and functional deficits in the context of the above medical diagnosis(es)? Yes Addressing deficits in the following areas: balance, endurance, locomotion, strength, transferring, bowel/bladder control, bathing, dressing, feeding, grooming, and toileting Can the patient actively participate in an intensive therapy program of at least 3 hrs of therapy 5 days a week? Yes The potential for patient to make measurable gains while on inpatient rehab is good Anticipated functional outcomes upon discharge from inpatient rehab: supervision PT, supervision OT, n/a SLP Estimated rehab length of stay to reach the above functional goals is: 12-14 days Anticipated discharge destination: Home 10. Overall Rehab/Functional Prognosis: good

## 2021-05-20 NOTE — Progress Notes (Signed)
Orthopaedics Daily Progress Note ? ? ?05/20/2021   7:19 AM ? ?Jack Neal is a 76 y.o. male 5 Days Post-Op s/p INTRAMEDULLARY (IM) NAIL INTERTROCHANTRIC ? ?Subjective ?Pain controlled.  Denies nausea, vomiting, or fevers.   ? ?Objective ?Vitals:  ? 05/19/21 1544 05/19/21 1957  ?BP: 95/64 (!) 97/58  ?Pulse: 80 80  ?Resp: 18 17  ?Temp: 98.1 ?F (36.7 ?C) 98.7 ?F (37.1 ?C)  ?SpO2: 100% 100%  ? ? ?Intake/Output Summary (Last 24 hours) at 05/20/2021 0719 ?Last data filed at 05/20/2021 0600 ?Gross per 24 hour  ?Intake 240 ml  ?Output 4650 ml  ?Net -4410 ml  ? ? ? ?Physical Exam ?RLE: ?Dressing clean, dry, and intact ?+DF/PF/EHL ?SILT SP/DP/T ?WWP distally ? ?Assessment ?76 y.o. male s/p Procedure(s) (LRB): ?INTRAMEDULLARY (IM) NAIL INTERTROCHANTRIC (Right) ? ?Plan ?Mobility: Out of bed with PT/OT ?Pain control: Continue to wean/titrate to appropriate oral regimen ?DVT Prophylaxis: Lovenox 30 mg twice daily x6 weeks ?Further surgical plans: None ?RUE: Weightbearing as tolerated, limited abduction and forward flexion to 90 degrees due to acromial fracture ?LUE: Weightbearing as tolerated, no restrictions ?RLE: Weightbearing as tolerated, no restrictions ?LLE: Weightbearing as tolerated, no restrictions ?Disposition: Per primary team, as medically appropriate ?Dressing care: Keep AQUACEL on and dry for up to 14 days.  Do not allow surgical area to get wet before that.  Remove AQUACEL dressing after 14 days and allow area to get wet in shower but DO NOT SUBMERGE until wound is evaluated in clinic.  In most cases skin glue is used and no additional dressing is necessary.  ?Follow-up: Please call [facility] [phone]) to schedule follow-op appointment for 2 weeks after surgery. ? ?I have verified that my discharge instructions and follow-up information have been entered in the Discharge Navigator in Epic.  These should automatically populate in the AVS.  Please print the AVS in its entirety and  ensure that the patient or a responsible party has a complete copy of the AVS before they are discharged.  If there are questions regarding discharge instructions or follow-up before the AVS is generated, please check the Discharge Navigator before attempting to contact the surgeon/office.  If unsure how to access the Discharge Navigator or the information contained in the Discharge Navigator, or how to generate/print the AVS, please contact the appropriate Nurse, learning disability.  ? ?Georgeanna Harrison M.D. ?Orthopaedic Surgery ?[facility] ? ?

## 2021-05-20 NOTE — Progress Notes (Signed)
Physical Therapy Treatment ?Patient Details ?Name: Jack Neal ?MRN: [id number] ?DOB: 09/15/1945 ?Today's Date: 05/20/2021 ? ? ?History of Present Illness Pt is a 76 y.o. male admitted 05/14/21 after falling backwards while trying to pull on his slippers, denies LOC; became hypotensive after imaging requiring transfusion. Pt sustained R intertrochanteric hip fx, R posterior acromial process fx. S/p R hip IMN 3/2. PMH includes PAF, neuropathy, pacemaker, OSA on CPAP, PTSD, depression, chronic pain syndrome. ? ?  ?PT Comments  ? ? Session focused on progression of functional mobility with pt continuing to progress towards goals requiring min assist for transfer and gait grossly for cueing. Focus on advancement of RLE by picking up foot and not sliding foot forward with pt able to correct with task practice with further LE therex for reinforcement and increased strength and ROM. Pt with good recall on LE sequencing and all WB and movement precautions. Pt continues to be motivated to return to PLF, continuing to work through high pain levels even with premedication. Current plan remains appropriate to maximize functional recovery and return to independence. Pt continues to benefit from skilled PT services to progress toward functional mobility goals.  ?  ?Recommendations for follow up therapy are one component of a multi-disciplinary discharge planning process, led by the attending physician.  Recommendations may be updated based on patient status, additional functional criteria and insurance authorization. ? ?Follow Up Recommendations ? Acute inpatient rehab (3hours/day) ?  ?  ?Assistance Recommended at Discharge Intermittent Supervision/Assistance  ?Patient can return home with the following A little help with walking and/or transfers;A little help with bathing/dressing/bathroom;Assistance with cooking/housework;Assist for transportation;Help with stairs or ramp for entrance ?  ?Equipment Recommendations ? None recommended  by PT  ?  ?Recommendations for Other Services   ? ? ?  ?Precautions / Restrictions Precautions ?Precautions: Fall ?Restrictions ?Weight Bearing Restrictions: Yes ?RUE Weight Bearing: Weight bearing as tolerated ?RLE Weight Bearing: Weight bearing as tolerated ?Other Position/Activity Restrictions: RUE shoulder </ 90' flexion and abduction  ?  ? ?Mobility ? Bed Mobility ?Overal bed mobility: Needs Assistance ?Bed Mobility: Supine to Sit ?  ?  ?Supine to sit: Supervision, Min guard ?  ?  ?General bed mobility comments: min guard for safety, pt able to come to EOB without physical help with verbal cueing for efficiency and increased time needed ?  ? ?Transfers ?Overall transfer level: Needs assistance ?Equipment used: Rolling walker (2 wheels) ?Transfers: Sit to/from Stand ?Sit to Stand: Min assist ?  ?  ?  ?  ?  ?General transfer comment: increased time needed, minimal cuing needed for hand placement, min assist to power up from EOB ?  ? ?Ambulation/Gait ?Ambulation/Gait assistance: Min assist, Mod assist ?Gait Distance (Feet): 20 Feet ?Assistive device: Rolling walker (2 wheels) ?Gait Pattern/deviations: Step-to pattern, Decreased step length - right, Decreased stance time - right, Trunk flexed, Antalgic ?Gait velocity: decr ?  ?  ?General Gait Details: min assist for verbal cues for LE sequecing and lifting R foot to step as opposed to sliding, pt able to correct with task practice ? ? ?Stairs ?  ?  ?  ?  ?  ? ? ?Wheelchair Mobility ?  ? ?Modified Rankin (Stroke Patients Only) ?  ? ? ?  ?Balance Overall balance assessment: Needs assistance ?Sitting-balance support: No upper extremity supported, Feet supported ?Sitting balance-Leahy Scale: Good ?  ?  ?Standing balance support: Reliant on assistive device for balance ?Standing balance-Leahy Scale: Poor ?Standing balance comment: dependent on BUE support, able  to stand for entirety of session for ambulation and therex ?  ?  ?  ?  ?  ?  ?  ?  ?  ?  ?  ?  ? ?  ?Cognition  Arousal/Alertness: Awake/alert ?Behavior During Therapy: [facility] for tasks assessed/performed ?Overall Cognitive Status: Within Functional Limits for tasks assessed ?  ?  ?  ?  ?  ?  ?  ?  ?  ?  ?  ?  ?  ?  ?  ?  ?General Comments: pt motivated despite pain, cautious with movement ?  ?  ? ?  ?Exercises General Exercises - Lower Extremity ?Hip ABduction/ADduction: AROM, Right, 10 reps, Standing ?Hip Flexion/Marching: AROM, Right, 20 reps, Standing ?Toe Raises: AROM, Both, 10 reps, Standing ?Other Exercises ?Other Exercises: knee flexion in standing x10 RLE ? ?  ?General Comments General comments (skin integrity, edema, etc.): VSS on RA ?  ?  ? ?Pertinent Vitals/Pain Pain Assessment ?Pain Assessment: Faces ?Faces Pain Scale: Hurts even more ?Pain Location: RLE with any movement ?Pain Descriptors / Indicators: Discomfort, Sore, Guarding, Grimacing ?Pain Intervention(s): Limited activity within patient's tolerance, Monitored during session, Premedicated before session, Repositioned  ? ? ?Home Living   ?  ?  ?  ?  ?  ?  ?  ?  ?  ?   ?  ?Prior Function    ?  ?  ?   ? ?PT Goals (current goals can now be found in the care plan section) Acute Rehab PT Goals ?PT Goal Formulation: With patient ?Time For Goal Achievement: 05/30/21 ? ?  ?Frequency ? ? ? Min 5X/week ? ? ? ?  ?PT Plan Current plan remains appropriate  ? ? ?Co-evaluation   ?  ?  ?  ?  ? ?  ?AM-PAC PT "6 Clicks" Mobility   ?Outcome Measure ? Help needed turning from your back to your side while in a flat bed without using bedrails?: A Little ?Help needed moving from lying on your back to sitting on the side of a flat bed without using bedrails?: A Little ?Help needed moving to and from a bed to a chair (including a wheelchair)?: A Little ?Help needed standing up from a chair using your arms (e.g., wheelchair or bedside chair)?: A Little ?Help needed to walk in hospital room?: A Lot ?Help needed climbing 3-5 steps with a railing? : Total ?6 Click Score: 15 ? ?  ?End of  Session Equipment Utilized During Treatment: Gait belt ?Activity Tolerance: Patient tolerated treatment well ?Patient left: in chair;with call bell/phone within reach ?Nurse Communication: Mobility status;Other (comment) (pt requesting pain medication) ?PT Visit Diagnosis: Other abnormalities of gait and mobility (R26.89);Muscle weakness (generalized) (M62.81);Pain ?Pain - Right/Left: Right ?Pain - part of body: Leg ?  ? ? ?Time: [id number] ?PT Time Calculation (min) (ACUTE ONLY): 34 min ? ?Charges:  $Gait Training: 8-22 mins ?$Therapeutic Exercise: 8-22 mins          ?          ? ?Audry Riles. PTA ?Acute Rehabilitation Services ?Office: [phone] ? ? ? ?Betsey Holiday Paulene Tayag ?05/20/2021, 9:52 AM ? ?

## 2021-05-20 NOTE — Progress Notes (Signed)
Inpatient Rehabilitation Admission Medication Review by a Pharmacist ? ?A complete drug regimen review was completed for this patient to identify any potential clinically significant medication issues. ? ?High Risk Drug Classes Is patient taking? Indication by Medication  ?Antipsychotic No   ?Anticoagulant Yes Lovenox for VTE ppx  ?Antibiotic No   ?Opioid Yes Vicodin prn pain  ?Antiplatelet Yes Plavix, Aspirin for CVA prevention  ?Hypoglycemics/insulin No   ?Vasoactive Medication No   ?Chemotherapy No   ?Other Yes Suboxone for hx of opioid dependence ? ?Doxepin, Cymbalta, Razadyne, Namenda, hydroxyzine for dementia with behavioral disturbance ? ?Pancrease for pancreatic insufficiency ? ?Protonix for GERD  ? ? ? ?Type of Medication Issue Identified Description of Issue Recommendation(s)  ?Drug Interaction(s) (clinically significant) ?    ?Duplicate Therapy ?    ?Allergy ?    ?No Medication Administration End Date ?    ?Incorrect Dose ?    ?Additional Drug Therapy Needed ?    ?Significant med changes from prior encounter (inform family/care partners about these prior to discharge). On prazosin prior to admission Resume if and when appropriate  ?Other ?    ? ? ?Clinically significant medication issues were identified that warrant physician communication and completion of prescribed/recommended actions by midnight of the next day:  No ? ? ?Pharmacist comments:  ? ?Time spent performing this drug regimen review (minutes):  20 minutes ? ?Elwin Sleight ?05/20/2021 1:45 PM ?

## 2021-05-20 NOTE — Progress Notes (Signed)
Pt. Places himself on cpap when ready. 

## 2021-05-20 NOTE — H&P (Signed)
Physical Medicine and Rehabilitation Admission H&P         Chief Complaint  Patient presents with   Trauma      Fall  : HPI: Rachelle Franks is a 76 year old right-handed/ambidextrous male with history of PAF/pacemaker maintained on aspirin and Plavix, presence of watchman left atrial appendage closure device placed October 2022, PTSD, mild dementia maintained on Namenda as well as Razadyne, chronic pain syndrome/peripheral polyneuropathy maintained on Suboxone followed by Dr.IHEAGWARA at the [place] [phone] extension [phone], OSA on CPAP, hypertension, tobacco use.  Per chart review patient lives with spouse.  1 level home.  Wife can provide very little physical assistance.  Patient was independent with a rollator prior to admission.  He does not drive.  Presented 05/14/2021 after mechanical fall without loss of consciousness landing on his right side.  Cranial CT scan negative.  CT cervical spine negative.  CT chest abdomen pelvis showed acute minimally displaced fracture of the right acromion.  Acute comminuted angulated intertrochanteric fracture of the right hip with moderate surrounding intramuscular and interstitial hemorrhage.  Patient underwent open treatment of right intertrochanteric femur fracture with intramedullary implant with interlocking screws 05/15/2021 per Dr. Mable Fill.  Patient is weightbearing as tolerated.  Conservative care of right acromion fracture limited abduction and forward flexion to 90 degrees due to fracture.  Placed on Lovenox for DVT prophylaxis.  He remains on aspirin and Plavix as prior to admission.  Acute blood loss anemia hemoglobin 8.2.  Transfused 1 unit packed red blood cells 3/4.  Therapy evaluations completed due to patient decreased functional mobility was admitted for a comprehensive rehab program.     Pt reports R shoulder pain 3-4/10 most of time- no worse and 5/10 on R hip - but can be better with meds.  LBM just before came to CIR  today.  Voiding well with condom catheter- says nursing plans to remove.  Having a lot of GERD- taking tums and restarted on protonix this AM.      Review of Systems  Constitutional:  Negative for chills and fever.  HENT:  Negative for hearing loss.   Eyes:  Negative for blurred vision and double vision.  Respiratory:  Negative for cough and shortness of breath.   Cardiovascular:  Negative for chest pain, palpitations and leg swelling.  Gastrointestinal:  Positive for constipation. Negative for heartburn, nausea and vomiting.       GERD  Genitourinary:  Negative for dysuria, flank pain and hematuria.  Musculoskeletal:  Positive for falls and myalgias.  Skin:  Negative for rash.  Psychiatric/Behavioral:  Positive for depression and memory loss.        PTSD  All other systems reviewed and are negative.     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   Hypotension     Major depressive disorder 10/01/2020   Neuropathy     OSA on CPAP 10/01/2020   Pacemaker     Peripheral polyneuropathy 10/01/2020   Presence of cardiac pacemaker 10/01/2020   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    Watchman FLX 20   PTSD (post-traumatic stress disorder) 10/01/2020         Past Surgical History:  Procedure Laterality Date  INTRAMEDULLARY (IM) NAIL INTERTROCHANTERIC Right 05/15/2021    Procedure: INTRAMEDULLARY (IM) NAIL INTERTROCHANTRIC;  Surgeon: Georgeanna Harrison, MD;  Location: [place];  Service: Orthopedics;  Laterality: Right;   LEFT ATRIAL APPENDAGE OCCLUSION N/A 01/02/2021    Procedure: LEFT ATRIAL APPENDAGE OCCLUSION;  Surgeon: Vickie Epley, MD;  Location: [place] [facility];  Service: Cardiovascular;  Laterality: N/A;    TEE WITHOUT CARDIOVERSION N/A 01/02/2021    Procedure: TRANSESOPHAGEAL ECHOCARDIOGRAM (TEE);  Surgeon: Vickie Epley, MD;  Location: [place] [facility];  Service: Cardiovascular;  Laterality: N/A;   TEE WITHOUT CARDIOVERSION N/A 02/17/2021    Procedure: TRANSESOPHAGEAL ECHOCARDIOGRAM (TEE);  Surgeon: Geralynn Rile, MD;  Location: [facility];  Service: Cardiovascular;  Laterality: N/A;         Family History  Problem Relation Age of Onset   Heart attack Mother     Ulcers Father     Brain cancer Sister     Heart disease Neg Hx      Social History:  reports that he has quit smoking. His smoking use included cigarettes. He has never used smokeless tobacco. He reports that he does not currently use alcohol. He reports that he does not use drugs. Allergies:       Allergies  Allergen Reactions   Amphetamine Rash and Other (See Comments)      Altered mental status- amnesia (also)   Diazepam Rash      Other reaction(s): Amnesia, Mental Status Changes (intolerance), Other amnesia     Zoloft [Sertraline] Anxiety, Rash and Other (See Comments)      Panic attacks and Cardiovascular Arrest (ALSO)     Gabapentin        Tremors/jerking, hallucinations, dry mouth   Gluten Meal Diarrhea   Penicillin G Rash          Medications Prior to Admission  Medication Sig Dispense Refill   acetaminophen (TYLENOL) 500 MG tablet Take 500 mg by mouth every 6 (six) hours as needed for moderate pain.       aspirin EC 81 MG tablet Take 1 tablet (81 mg total) by mouth daily. Swallow whole. 90 tablet 3   azithromycin (ZITHROMAX) 500 MG tablet Take 1 tablet (500mg ) one hour prior to all dental visits until 07/03/2021. 3 tablet 4   bumetanide (BUMEX) 2 MG tablet Take 4 mg by mouth daily as needed (edema).       buprenorphine-naloxone (SUBOXONE) 2-0.5 mg SUBL SL tablet Place 1 tablet under the tongue daily.       calcium elemental as carbonate (TUMS ULTRA 1000) 400 MG chewable tablet Chew 1,000 mg by  mouth daily as needed for heartburn.       camphor-menthol (SARNA) lotion Apply 1 application topically daily as needed for itching.       Carboxymethylcellulose Sodium (ARTIFICIAL TEARS OP) Place 1 drop into both eyes 3 (three) times daily as needed (dry eyes).       Cholecalciferol (VITAMIN D) 50 MCG (2000 UT) tablet Take 2,000 Units by mouth 2 (two) times daily.       clopidogrel (PLAVIX) 75 MG tablet Take 1 tablet (75 mg total) by mouth daily. 90 tablet 1   Cyanocobalamin (VITAMIN B-12 PO) Take 6 mcg by mouth in the morning and at bedtime.       diclofenac Sodium (VOLTAREN) 1 % GEL Apply 2 g topically daily. For feet pain       doxepin (SINEQUAN) 10 MG capsule Take 10 mg by mouth at  bedtime.       DULoxetine (CYMBALTA) 30 MG capsule Take 30-60 mg by mouth See admin instructions. 60 mg in the morning  30 mg at bedtime       ferrous sulfate 325 (65 FE) MG tablet Take 325 mg by mouth every Monday, Wednesday, and Friday.       fluticasone (FLONASE) 50 MCG/ACT nasal spray Place 2 sprays into both nostrils daily as needed for allergies or rhinitis.       galantamine (RAZADYNE ER) 8 MG 24 hr capsule Take 16 mg by mouth at bedtime.       hydrOXYzine (ATARAX/VISTARIL) 10 MG tablet Take 1 tablet (10 mg total) by mouth 3 (three) times daily as needed for anxiety (Severe jerking/tremors.). (Patient taking differently: Take 20 mg by mouth at bedtime.) 30 tablet 0   ketoconazole (NIZORAL) 2 % shampoo Apply 1 application topically every Monday, Wednesday, and Friday.       Krill Oil 1000 MG CAPS Take 1,000 mg by mouth daily.       lidocaine (LIDODERM) 5 % Place 1 patch onto the skin daily. feet       Lidocaine HCl 4 % CREA Apply 1 g topically daily as needed (pain). Icy Hot       loperamide (IMODIUM A-D) 2 MG tablet Take 2 mg by mouth 3 (three) times daily as needed for diarrhea or loose stools.       MAGNESIUM PO Take 375 mg by mouth in the morning and at bedtime.       memantine (NAMENDA) 10 MG tablet  Take 10-20 mg by mouth See admin instructions. 10 mg in the morning  20 mg at bedtime       Multiple Vitamin (MULTIVITAMIN) capsule Take 1 capsule by mouth daily.       NON FORMULARY CPAP at bedtime       Pancrelipase, Lip-Prot-Amyl, (CREON PO) Take 36,000 Units by mouth 3 (three) times daily with meals.       POTASSIUM PO Take 450 mg by mouth in the morning and at bedtime.       prazosin (MINIPRESS) 1 MG capsule Take 2 mg by mouth at bedtime.       Probiotic CHEW Chew 3 capsules by mouth daily.       Simethicone 180 MG CAPS Take 180 mg by mouth daily as needed (gas).       sodium chloride (OCEAN) 0.65 % SOLN nasal spray Place 1 spray into both nostrils at bedtime.       Sodium Fluoride 1.1 % PSTE Place 1 application onto teeth 2 (two) times daily.       VITAMIN A PO Take 4,000 Units by mouth daily.       vitamin C (ASCORBIC ACID) 250 MG tablet Take 250 mg by mouth 2 (two) times daily.       zinc sulfate 220 (50 Zn) MG capsule Take 220 mg by mouth daily.       pantoprazole (PROTONIX) 40 MG tablet Take 1 tablet (40 mg total) by mouth daily. (Patient not taking: Reported on 05/15/2021) 60 tablet 2          Home: [place] expects to be discharged to:: Private residence Living Arrangements: Spouse/significant other Available Help at Discharge: Family, Available 24 hours/day Type of Home: House Home Access: Level entry Home Layout: One level Bathroom Shower/Tub: Chiropodist: Standard Bathroom Accessibility: Yes Home Equipment: Rollator (4 wheels), Tub bench, Transport chair, [employer] -  single point Additional Comments: reports wife not able to physically assist with mobility, but could help some with ADLs  Lives With: Spouse   Functional History: Prior Function Prior Level of Function : Independent/Modified Independent Mobility Comments: Mod indep ambulating "with my rollator 98% of the time, 1% with two canes and the other 1% to my bathroom just holding  onto the wall" - reports need for DME is not a strength issue, but a balance issue. Currently working with HHPT services. Retired Cabin crew ADLs Comments: Typically sponge bathes at sink, will use tub transfer bench 1x/wk for shower   Functional Status:  Mobility: Bed Mobility Overal bed mobility: Needs Assistance Bed Mobility: Supine to Sit Supine to sit: Min assist General bed mobility comments: pt OOB upon arrival Transfers Overall transfer level: Needs assistance Equipment used: Rolling walker (2 wheels) Transfers: Sit to/from Stand Sit to Stand: Min assist, Mod assist Bed to/from chair/wheelchair/BSC transfer type:: Stand pivot Stand pivot transfers: Min guard, Min assist Step pivot transfers: Mod assist General transfer comment: increased time needed, minimal cuing needed for hand placement, min-mod assist for power up from low recliner Ambulation/Gait Ambulation/Gait assistance: Min assist, Mod assist Gait Distance (Feet): 18 Feet Assistive device: Rolling walker (2 wheels) Gait Pattern/deviations: Step-to pattern, Decreased step length - right, Decreased stance time - right, Trunk flexed, Antalgic General Gait Details: mod assist for verbal and tactile cues for LE sequecing, pt able to advance RLE forward and clear foot at start, with pt sliding RLE forward last few steps secondary to pain and fatigue. Gait velocity: decr Pre-gait activities: standing weight shift R<>L to accept weight on RLE   ADL: ADL Overall ADL's : Needs assistance/impaired Eating/Feeding: Independent, Sitting Grooming: Set up, Sitting Upper Body Bathing: Set up, Sitting Lower Body Bathing: Maximal assistance, Sitting/lateral leans Upper Body Dressing : Minimal assistance, Sitting Upper Body Dressing Details (indicate cue type and reason): educated on compensatory strategy to don UB clothing to adhere to RUE movement precautions Lower Body Dressing: Moderate assistance, Sitting/lateral leans Lower Body  Dressing Details (indicate cue type and reason): don socks sitting up in chair Toilet Transfer: Min guard, Rolling walker (2 wheels), Regular Toilet, Stand-pivot, Minimal assistance Toilet Transfer Details (indicate cue type and reason): simulated to chair Functional mobility during ADLs: Min guard, Minimal assistance, Rolling walker (2 wheels)   Cognition: Cognition Overall Cognitive Status: Within Functional Limits for tasks assessed Orientation Level: Oriented X4 Cognition Arousal/Alertness: Awake/alert Behavior During Therapy: WFL for tasks assessed/performed Overall Cognitive Status: Within Functional Limits for tasks assessed General Comments: pt motivated despite pain, cautious with movement   Physical Exam: Blood pressure (!) 102/58, pulse 81, temperature 98.3 F (36.8 C), temperature source Oral, resp. rate 16, height  (1.803 m), weight 77.1 kg, SpO2 99 %. Physical Exam Vitals and nursing note reviewed.  Constitutional:      Comments: Pt appears stated age; sitting up in bedside chair; with condom catheter in place; NAD  HENT:     Head: Normocephalic and atraumatic.     Right Ear: External ear normal.     Left Ear: External ear normal.     Mouth/Throat:     Mouth: Mucous membranes are dry.     Pharynx: Oropharynx is clear. No oropharyngeal exudate.  Eyes:     General:        Right eye: No discharge.        Left eye: No discharge.     Extraocular Movements: Extraocular movements intact.  Cardiovascular:  Rate and Rhythm: Normal rate. Rhythm irregular.     Heart sounds: Normal heart sounds. No murmur heard.   No gallop.  Pulmonary:     Effort: Pulmonary effort is normal. No respiratory distress.     Breath sounds: Normal breath sounds. No wheezing, rhonchi or rales.  Abdominal:     General: Bowel sounds are normal. There is no distension.     Palpations: Abdomen is soft.     Tenderness: There is no abdominal tenderness.  Genitourinary:    Comments: Condom  catheter in place- light amber urine in bag Musculoskeletal:     Cervical back: Neck supple. No tenderness.     Comments: UE strength 5/5 B/L LLE- 5/5 in HF, KE, DF and PF RLE- 2/5 in proximal RLE; 5/5 distally TTP over R lateral hip and groin as well as R acromium  Skin:    Comments: Right hip incision is dressed. Moderate swelling; no erythema; original surgical dressing in place Large purple bruise inner upper R arm 1-2+ LE edema to distal calves B/L Venous stasis changes distal 1/3 of calves B/L R forearm IV - looks OK  Neurological:     Mental Status: He is alert and oriented to person, place, and time.     Comments: Patient is alert.  Makes eye contact with examiner.  Provides name and age.  Limited medical historian.  Follows commands. Decreased sensation to light touch in lower legs bowel knees B/L  Otherwise, intact above knees and in arms  Psychiatric:        Mood and Affect: Mood normal.        Behavior: Behavior normal.      Lab Results Last 48 Hours        Results for orders placed or performed during the hospital encounter of 05/14/21 (from the past 48 hour(s))  CBC     Status: Abnormal    Collection Time: 05/18/21  2:11 AM  Result Value Ref Range    WBC 5.6 4.0 - 10.5 K/uL    RBC 2.43 (L) 4.22 - 5.81 MIL/uL    Hemoglobin 7.9 (L) 13.0 - 17.0 g/dL    HCT 23.0 (L) 39.0 - 52.0 %    MCV 94.7 80.0 - 100.0 fL    MCH 32.5 26.0 - 34.0 pg    MCHC 34.3 30.0 - 36.0 g/dL    RDW 16.4 (H) 11.5 - 15.5 %    Platelets 94 (L) 150 - 400 K/uL      Comment: Immature Platelet Fraction may be clinically indicated, consider ordering this additional test [id number] CONSISTENT WITH PREVIOUS RESULT REPEATED TO VERIFY      nRBC 0.0 0.0 - 0.2 %      Comment: Performed at [facility], [place] [address] [place], [place] [id number]  Basic metabolic panel     Status: Abnormal    Collection Time: 05/18/21  2:11 AM  Result Value Ref Range    Sodium 138 135 - 145 mmol/L    Potassium  3.8 3.5 - 5.1 mmol/L    Chloride 98 98 - 111 mmol/L    CO2 33 (H) 22 - 32 mmol/L    Glucose, Bld 95 70 - 99 mg/dL      Comment: Glucose reference range applies only to samples taken after fasting for at least 8 hours.    BUN 22 8 - 23 mg/dL    Creatinine, Ser 0.67 0.61 - 1.24 mg/dL    Calcium 8.2 (L) 8.9 -  10.3 mg/dL    GFR, Estimated >62 >03 mL/min      Comment: (NOTE) Calculated using the CKD-EPI Creatinine Equation (2021)      Anion gap 7 5 - 15      Comment: Performed at [facility], [address] [place], [place] [address]  CBC     Status: Abnormal    Collection Time: 05/19/21  2:04 AM  Result Value Ref Range    WBC 4.7 4.0 - 10.5 K/uL    RBC 2.49 (L) 4.22 - 5.81 MIL/uL    Hemoglobin 7.8 (L) 13.0 - 17.0 g/dL    HCT 16.3 (L) 84.5 - 52.0 %    MCV 97.6 80.0 - 100.0 fL    MCH 31.3 26.0 - 34.0 pg    MCHC 32.1 30.0 - 36.0 g/dL    RDW 36.4 (H) 68.0 - 15.5 %    Platelets 127 (L) 150 - 400 K/uL    nRBC 0.6 (H) 0.0 - 0.2 %      Comment: Performed at [facility], [address] [place], [place] [address]  Basic metabolic panel     Status: Abnormal    Collection Time: 05/19/21  2:04 AM  Result Value Ref Range    Sodium 136 135 - 145 mmol/L    Potassium 3.9 3.5 - 5.1 mmol/L    Chloride 97 (L) 98 - 111 mmol/L    CO2 33 (H) 22 - 32 mmol/L    Glucose, Bld 100 (H) 70 - 99 mg/dL      Comment: Glucose reference range applies only to samples taken after fasting for at least 8 hours.    BUN 25 (H) 8 - 23 mg/dL    Creatinine, Ser 4.82 0.61 - 1.24 mg/dL    Calcium 8.5 (L) 8.9 - 10.3 mg/dL    GFR, Estimated >50 >03 mL/min      Comment: (NOTE) Calculated using the CKD-EPI Creatinine Equation (2021)      Anion gap 6 5 - 15      Comment: Performed at [facility], [address] [place], [place] [address]      Imaging Results (Last 48 hours)  No results found.         Blood pressure (!) 102/58, pulse 81, temperature 98.3 F (36.8 C), temperature source Oral,  resp. rate 16, height 5\' 11"  (1.803 m), weight 77.1 kg, SpO2 99 %.   Medical Problem List and Plan: 1. Functional deficits secondary to acute minimally displaced fracture of right acromion with conservative care/weightbearing as tolerated.  Acute comminuted angulated intertrochanteric fracture of right hip with moderate surrounding intramuscular interstitial hemorrhage.  Status post open treatment with intramedullary implant and interlocking screws 05/15/2021.  Weightbearing as tolerated.             -patient may  shower if dressing covered             -ELOS/Goals: 10-14 days supervision 2.  Antithrombotics: -DVT/anticoagulation:  Pharmaceutical: Lovenox x6 weeks.  Check vascular study             -antiplatelet therapy: Aspirin 81 mg daily and Plavix 75 mg daily 3. Pain Management/chronic pain/peripheral polyneuropathy: Maintained on Suboxone 2-0.5 mg 1 tablet daily as prior to admission followed by Dr.IHEAGWARA at the [facility] [phone] extension [phone], Voltaren gel as needed, Robaxin as needed, hydrocodone as needed 4. Mood/PTSD with dementia: Cymbalta 60 mg daily and 30 mg nightly, Namenda 10 mg daily  and 20 mg nightly,Razadyne 16 mg nightly             -antipsychotic agents: N/A 5. Neuropsych: This patient is capable of making decisions on his own behalf. 6. Skin/Wound Care: Routine skin checks.  Keep Aquacel on and dry for up to 14 days. 7. Fluids/Electrolytes/Nutrition: Routine in and outs with follow-up chemistries 8.  Hypertension.  Monitor with increased mobility 9.  OSA.  CPAP 10.  Acute blood loss anemia.  S/p 2 units pRBCs. Follow-up CBC.  Continue iron supplement 11.  Atrial fibrillation/pacemaker/watchman.  Cardiac rate controlled. 12.  Pancreatic insufficiency.  Pancrelipase 13. GERD_ just restarted Porotonix- having a lot of GERD Sx's- con't tums prn     I have personally performed a face to face diagnostic evaluation of this patient and  formulated the key components of the plan.  Additionally, I have personally reviewed laboratory data, imaging studies, as well as relevant notes and concur with the physician assistant's documentation above.   The patient's status has not changed from the original H&P.  Any changes in documentation from the acute care chart have been noted above.       Lavon Paganini Angiulli, PA-C 05/19/2021

## 2021-05-20 NOTE — Discharge Instructions (Signed)
Inpatient Rehab Discharge Instructions  [place] Discharge date and time: No discharge date for patient encounter.   Activities/Precautions/ Functional Status: Activity: activity as tolerated Diet: regular diet Wound Care: Routine skin checks Functional status:  ___ No restrictions     ___ Walk up steps independently ___ 24/7 supervision/assistance   ___ Walk up steps with assistance ___ Intermittent supervision/assistance  ___ Bathe/dress independently ___ Walk with walker     _x__ Bathe/dress with assistance ___ Walk Independently    ___ Shower independently ___ Walk with assistance    ___ Shower with assistance ___ No alcohol     ___ Return to work/school ________  Special Instructions: No driving smoking or alcohol   My questions have been answered and I understand these instructions. I will adhere to these goals and the provided educational materials after my discharge from the hospital.  Patient/Caregiver Signature _______________________________ Date __________  Clinician Signature _______________________________________ Date __________  Please bring this form and your medication list with you to all your follow-up doctor's appointments.

## 2021-05-20 NOTE — Plan of Care (Signed)

## 2021-05-20 NOTE — Progress Notes (Signed)
Inpatient Rehab Admissions Coordinator:  ? ?I have a bed for this Pt. On CIR today. RN may call report to [phone]. ? ?Idaly Verret, MS, CCC-SLP ?Rehab Admissions Coordinator  ?[phone] (celll) ?[phone] (office) ?

## 2021-05-20 NOTE — Discharge Summary (Signed)
[facility]  Physician Discharge Summary   Patient ID: Jack Neal MRN: [id number] DOB/AGE: [birth year] 76 y.o.  Admit date: 05/14/2021 Discharge date:   05/20/2021   PCP: Clinic, [place]:  Principal Problem:   Closed right hip fracture [facility]) Active Problems:   Closed fracture of acromion   Permanent atrial fibrillation ([name])   Dementia without behavioral disturbance ([name])   Macrocytic anemia   Thrombocytopenia ([name])   Pancreatic insufficiency   Hyponatremia   Opioid dependence ([name])   Constipation   OSA on CPAP   Abnormal thyroid function test   Dysuria   Malnutrition of moderate degree  PATIENT BEING DISCHARGED TO INPATIENT REHABILITATION   RECOMMENDATIONS FOR OUTPATIENT FOLLOW UP: Needs to have thyroid function test checked in 3 weeks. Lovenox to continue for 6 weeks from 05/16/2021 Please check CBC and basic metabolic panel every week starting Friday   Home Health: Going to CIR Equipment/Devices: None  CODE STATUS: DNR  DISCHARGE CONDITION: fair  Diet recommendation: Heart healthy  INITIAL HISTORY: 76 y.o. Caucasian male with medical history significant for atrial fibrillation, depression, GERD, PTSD, peripheral neuropathy, obstructive sleep apnea on CPAP, status post pacemaker, status post Watchman device, who presented to the ER after sustaining a mechanical fall.  He fell on his right side.  Came in with the right hip and shoulder pain.  Found to have right hip fracture along with a fracture of the right acromion.  He was hospitalized for further management.  Patient underwent surgery on 3/2.  Had drop in hemoglobin requiring blood transfusion.  Transfused another unit of PRBC on 3/4.  Hemoglobin stable.  Now waiting on CIR.  Consultants: Orthopedics.  Trauma surgery.   Procedures: ORIF right hip fracture 3/2   HOSPITAL COURSE:   * Closed right hip fracture [facility]) Patient underwent ORIF on 3/2.  Pain seems to be  reasonably well controlled.  Osteopenia suggested on imaging studies.  Vitamin D level is 51.15.   Seen by physical therapy.  Inpatient rehabilitation was recommended.  Waiting on bed availability.  Medically stable for discharge. On Lovenox for DVT prophylaxis per orthopedics.  Needs to be on it for 6 weeks.  Closed fracture of acromion Seen by orthopedics.This is being treated conservatively.  Weightbearing as tolerated.  Outpatient follow-up with orthopedics.  Permanent atrial fibrillation ([place]) He is status post PPM and Watchman left atrial appendage occlusive device.  Watchman device was placed on October 2022.  He was on Eliquis and aspirin for 45 days following which he has been on aspirin and Plavix.  To continue until April of this year.   After discussions with cardiology Plavix was held for surgery.  Due to transfusion requirements Plavix for was held for a few more days.  Plavix subsequently resumed yesterday.  Continue dual antiplatelet treatment with aspirin and Plavix until at least April of this year.  He will need follow-up with cardiology to determine the end date.  Dementia without behavioral disturbance ([place]) Continue [place] [facility] and Namenda..  Stable.  Thrombocytopenia ([name]) Platelet counts have rebounded.  Macrocytic anemia Acute blood loss anemia  Drop in hemoglobin most likely due to surgery and fracture.  He required 2 units of PRBC.  Hemoglobin stable for the last 72 hours.   Anemia panel was reviewed.  No clear-cut deficiencies identified.  Hyponatremia Appears to have resolved.  Pancreatic insufficiency Continue pancrelipase.  Opioid dependence ([place]) The patient is on Suboxone chronically.  This is being continued.  Constipation Moderate stool burden  noted on CT scan.  Started on laxatives and stool softeners.  Has had multiple bowel movements.  We will cut back on the dose of his laxatives.  OSA on CPAP Continue CPAP nightly.  Abnormal thyroid  function test TSH was checked due to constipation but noted to be 0.313.  Likely sick euthyroid.  Will recommend that this be rechecked in a few weeks.  No signs or symptoms of hyperthyroidism.  Dysuria UA noted to be normal.  There was no indication for antibacterials.   Moderate protein calorie malnutrition Nutrition Problem: Moderate Malnutrition Etiology: chronic illness (h/o of gastric bypass surgery) Signs/Symptoms: moderate fat depletion, severe muscle depletion Interventions: Juven, MVI, Snacks   Patient is stable for discharge to inpatient rehabilitation.   PERTINENT LABS:  The results of significant diagnostics from this hospitalization (including imaging, microbiology, ancillary and laboratory) are listed below for reference.    Microbiology: Recent Results (from the past 240 hour(s))  Resp Panel by RT-PCR (Flu A&B, Covid) Nasopharyngeal Swab     Status: None   Collection Time: 05/14/21 11:39 PM   Specimen: Nasopharyngeal Swab; Nasopharyngeal(NP) swabs in vial transport medium  Result Value Ref Range Status   SARS Coronavirus 2 by RT PCR NEGATIVE NEGATIVE Final    Comment: (NOTE) SARS-CoV-2 target nucleic acids are NOT DETECTED.  The SARS-CoV-2 RNA is generally detectable in upper respiratory specimens during the acute phase of infection. The lowest concentration of SARS-CoV-2 viral copies this assay can detect is 138 copies/mL. A negative result does not preclude SARS-Cov-2 infection and should not be used as the sole basis for treatment or other patient management decisions. A negative result may occur with  improper specimen collection/handling, submission of specimen other than nasopharyngeal swab, presence of viral mutation(s) within the areas targeted by this assay, and inadequate number of viral copies(<138 copies/mL). A negative result must be combined with clinical observations, patient history, and epidemiological information. The expected result is  Negative.  Fact Sheet for Patients:  EntrepreneurPulse.com.au  Fact Sheet for Healthcare Providers:  IncredibleEmployment.be  This test is no t yet approved or cleared by the [place] FDA and  has been authorized for detection and/or diagnosis of SARS-CoV-2 by FDA under an Emergency Use Authorization (EUA). This EUA will remain  in effect (meaning this test can be used) for the duration of the COVID-19 declaration under Section 564(b)(1) of the Act, 21 U.S.C.section 360bbb-3(b)(1), unless the authorization is terminated  or revoked sooner.       Influenza A by PCR NEGATIVE NEGATIVE Final   Influenza B by PCR NEGATIVE NEGATIVE Final    Comment: (NOTE) The Xpert Xpress SARS-CoV-2/FLU/RSV plus assay is intended as an aid in the diagnosis of influenza from Nasopharyngeal swab specimens and should not be used as a sole basis for treatment. Nasal washings and aspirates are unacceptable for Xpert Xpress SARS-CoV-2/FLU/RSV testing.  Fact Sheet for Patients: EntrepreneurPulse.com.au  Fact Sheet for Healthcare Providers: IncredibleEmployment.be  This test is not yet approved or cleared by the [place] FDA and has been authorized for detection and/or diagnosis of SARS-CoV-2 by FDA under an Emergency Use Authorization (EUA). This EUA will remain in effect (meaning this test can be used) for the duration of the COVID-19 declaration under Section 564(b)(1) of the Act, 21 U.S.C. section 360bbb-3(b)(1), unless the authorization is terminated or revoked.  Performed at [facility], [place] [address] [place], [place] [address]   Surgical pcr screen     Status: None   Collection  Time: 05/15/21  1:48 PM   Specimen: Nasal Mucosa; Nasal Swab  Result Value Ref Range Status   MRSA, PCR NEGATIVE NEGATIVE Final   Staphylococcus aureus NEGATIVE NEGATIVE Final    Comment: (NOTE) The Xpert SA Assay (FDA approved  for NASAL specimens in patients 36 years of age and older), is one component of a comprehensive surveillance program. It is not intended to diagnose infection nor to guide or monitor treatment. Performed at [facility], [place] [address] [place], [place] [address]   Urine Culture     Status: Abnormal   Collection Time: 05/16/21 10:23 AM   Specimen: Urine, Clean Catch  Result Value Ref Range Status   Specimen Description URINE, CLEAN CATCH  Final   Special Requests NONE  Final   Culture (A)  Final    <10,000 COLONIES/mL INSIGNIFICANT GROWTH Performed at [facility], [place] [address], [place], [place] [address]    Report Status 05/17/2021 FINAL  Final     Labs:  COVID-19 Labs    Lab Results  Component Value Date   SARSCOV2NAA NEGATIVE 05/14/2021   [place] NEGATIVE 12/31/2020   [place] NEGATIVE 11/09/2020   [place] NEGATIVE 10/01/2020      Basic Metabolic Panel: Recent Labs  Lab 05/16/21 0211 05/17/21 0224 05/18/21 0211 05/19/21 0204 05/20/21 0222  NA 130* 137 138 136 136  K 4.1 4.0 3.8 3.9 3.7  CL 100 103 98 97* 96*  CO2 23 28 33* 33* 34*  GLUCOSE 178* 106* 95 100* 101*  BUN 18 19 22  25* 23  CREATININE 0.82 0.71 0.67 0.64 0.65  CALCIUM 7.6* 8.1* 8.2* 8.5* 8.5*   Liver Function Tests: Recent Labs  Lab 05/14/21 2239  AST 20  ALT 12  ALKPHOS 143*  BILITOT 1.2  PROT 6.0*  ALBUMIN 3.4*    CBC: Recent Labs  Lab 05/16/21 0211 05/16/21 1456 05/17/21 0224 05/18/21 0211 05/19/21 0204 05/20/21 0222  WBC 7.2  --  7.8 5.6 4.7 3.9*  HGB 7.3* 7.3* 7.5* 7.9* 7.8* 8.2*  HCT 22.0* 22.3* 22.0* 23.0* 24.3* 26.3*  MCV 99.1  --  94.4 94.7 97.6 100.4*  PLT 98*  --  94* 94* 127* 149*     IMAGING STUDIES DG Shoulder Right  Result Date: 05/14/2021 CLINICAL DATA:  fall EXAM: RIGHT SHOULDER - 2+ VIEW COMPARISON:  Chest x-ray 01/02/2021 FINDINGS: Acute minimally displaced fracture of the acromion. No other acute fracture identified. No  shoulder dislocation. There is no evidence of arthropathy or other focal bone abnormality. Soft tissues are unremarkable. IMPRESSION: Acute minimally displaced fracture of the acromion. Electronically Signed   By: Iven Finn M.D.   On: 05/14/2021 23:23   CT HEAD WO CONTRAST (5MM)  Result Date: 05/14/2021 CLINICAL DATA:  Polytrauma, blunt; Head trauma, minor (Age >= 65y) EXAM: CT HEAD WITHOUT CONTRAST CT CERVICAL SPINE WITHOUT CONTRAST TECHNIQUE: Multidetector CT imaging of the head and cervical spine was performed following the standard protocol without intravenous contrast. Multiplanar CT image reconstructions of the cervical spine were also generated. RADIATION DOSE REDUCTION: This exam was performed according to the departmental dose-optimization program which includes automated exposure control, adjustment of the mA and/or kV according to patient size and/or use of iterative reconstruction technique. COMPARISON:  None. FINDINGS: CT HEAD FINDINGS BRAIN: BRAIN Cerebral ventricle sizes are concordant with the degree of cerebral volume loss. Patchy and confluent areas of decreased attenuation are noted throughout the deep and periventricular white matter of the cerebral hemispheres bilaterally, compatible  with chronic microvascular ischemic disease. No evidence of large-territorial acute infarction. No parenchymal hemorrhage. No mass lesion. No extra-axial collection. No mass effect or midline shift. No hydrocephalus. Basilar cisterns are patent. Vascular: No hyperdense vessel. Atherosclerotic calcifications are present within the cavernous internal carotid arteries. Skull: No acute fracture or focal lesion. Sinuses/Orbits: Paranasal sinuses and mastoid air cells are clear. Bilateral lens replacement. Otherwise the orbits are unremarkable. Other: None. CT CERVICAL SPINE FINDINGS Alignment: Grade anterolisthesis C2 on C3. Skull base and vertebrae: Diffusely decreased bone density. Multilevel severe  degenerative changes of the spine with C4-C5 vertebral body fusion. No acute fracture. No aggressive appearing focal osseous lesion or focal pathologic process. Soft tissues and spinal canal: No prevertebral fluid or swelling. No visible canal hematoma. Upper chest: Unremarkable. Other: None. IMPRESSION: 1. No acute intracranial abnormality. 2. No acute displaced fracture or traumatic listhesis of the cervical spine. 3. Diffusely decreased bone density. Electronically Signed   By: Iven Finn M.D.   On: 05/14/2021 23:32   CT Cervical Spine Wo Contrast  Result Date: 05/14/2021 CLINICAL DATA:  Polytrauma, blunt; Head trauma, minor (Age >= 65y) EXAM: CT HEAD WITHOUT CONTRAST CT CERVICAL SPINE WITHOUT CONTRAST TECHNIQUE: Multidetector CT imaging of the head and cervical spine was performed following the standard protocol without intravenous contrast. Multiplanar CT image reconstructions of the cervical spine were also generated. RADIATION DOSE REDUCTION: This exam was performed according to the departmental dose-optimization program which includes automated exposure control, adjustment of the mA and/or kV according to patient size and/or use of iterative reconstruction technique. COMPARISON:  None. FINDINGS: CT HEAD FINDINGS BRAIN: BRAIN Cerebral ventricle sizes are concordant with the degree of cerebral volume loss. Patchy and confluent areas of decreased attenuation are noted throughout the deep and periventricular white matter of the cerebral hemispheres bilaterally, compatible with chronic microvascular ischemic disease. No evidence of large-territorial acute infarction. No parenchymal hemorrhage. No mass lesion. No extra-axial collection. No mass effect or midline shift. No hydrocephalus. Basilar cisterns are patent. Vascular: No hyperdense vessel. Atherosclerotic calcifications are present within the cavernous internal carotid arteries. Skull: No acute fracture or focal lesion. Sinuses/Orbits: Paranasal  sinuses and mastoid air cells are clear. Bilateral lens replacement. Otherwise the orbits are unremarkable. Other: None. CT CERVICAL SPINE FINDINGS Alignment: Grade anterolisthesis C2 on C3. Skull base and vertebrae: Diffusely decreased bone density. Multilevel severe degenerative changes of the spine with C4-C5 vertebral body fusion. No acute fracture. No aggressive appearing focal osseous lesion or focal pathologic process. Soft tissues and spinal canal: No prevertebral fluid or swelling. No visible canal hematoma. Upper chest: Unremarkable. Other: None. IMPRESSION: 1. No acute intracranial abnormality. 2. No acute displaced fracture or traumatic listhesis of the cervical spine. 3. Diffusely decreased bone density. Electronically Signed   By: Iven Finn M.D.   On: 05/14/2021 23:32   DG Pelvis Portable  Result Date: 05/14/2021 CLINICAL DATA:  trauma EXAM: PORTABLE PELVIS 1-2 VIEWS COMPARISON:  None. FINDINGS: Acute comminuted displaced intertrochanteric fracture of the right femur. Cortical irregularity versus overlying bowel of the right iliac bone. Query slight asymmetry of the right sacroiliac joint compared to the left. Partially visualized left hip grossly unremarkable on frontal view. No right hip dislocation. No pelvic bone lesions are seen. IMPRESSION: 1. Acute comminuted displaced intertrochanteric fracture of the right femur. 2. Cortical irregularity versus overlying bowel of the right iliac bone. 3. Query slight asymmetry of the right sacroiliac joint compared to the left. Electronically Signed   By: Clelia Croft.D.  On: 05/14/2021 23:18   CT CHEST ABDOMEN PELVIS W CONTRAST  Result Date: 05/15/2021 CLINICAL DATA:  Blunt poly trauma, fall EXAM: CT CHEST, ABDOMEN, AND PELVIS WITH CONTRAST TECHNIQUE: Multidetector CT imaging of the chest, abdomen and pelvis was performed following the standard protocol during bolus administration of intravenous contrast. RADIATION DOSE REDUCTION: This exam  was performed according to the departmental dose-optimization program which includes automated exposure control, adjustment of the mA and/or kV according to patient size and/or use of iterative reconstruction technique. CONTRAST:  139mL OMNIPAQUE IOHEXOL 300 MG/ML  SOLN COMPARISON:  None. FINDINGS: CT CHEST FINDINGS Cardiovascular: Extensive multi-vessel coronary artery calcification. Mild global cardiomegaly. Left subclavian dual lead pacemaker is in place with leads within the right atrium and right ventricle toward the apex. No pericardial effusion. Central pulmonary arteries are of normal caliber. The thoracic aorta is of normal caliber. Mild atherosclerotic calcification. Mediastinum/Nodes: No pathologic thoracic adenopathy. Esophagus unremarkable. No pneumomediastinum. No mediastinal hematoma. Lungs/Pleura: Scattered areas of parenchymal scarring are noted within the left upper lung zone and right lung base. No confluent pulmonary infiltrate. No pneumothorax or pleural effusion. Central airways are widely patent. Musculoskeletal: Osseous structures are diffusely osteopenic. Acute minimally displaced right acromial fracture noted. Healed right clavicle fracture noted. 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 Status post cholecystectomy. No biliary dilatation. Pancreas: Unremarkable Spleen: Unremarkable Adrenals/Urinary Tract: Adrenal glands are unremarkable. 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 No hydronephrosis. No enhancing cortical mass. No intrarenal or ureteral calculi. The bladder is unremarkable. Stomach/Bowel: Surgical changes of Roux-en-Y gastric bypass are identified. Moderate stool throughout the colon without evidence of obstruction. The stomach, small bowel, and  large bowel are otherwise unremarkable without evidence of obstruction or focal inflammation. Appendix normal. No free intraperitoneal gas or fluid. Vascular/Lymphatic: Aortic atherosclerosis. No enlarged abdominal or pelvic lymph nodes. Reproductive: Prostate is unremarkable. Other: No abdominal wall hernia. Musculoskeletal: Comminuted, overriding intratrochanteric fracture of the right hip with varus angulation noted. Muscular thickening in intramuscular infiltration in keeping with intramuscular and interstitial hemorrhage surrounding the fracture. Subcutaneous infiltration within the posterolateral soft tissues of the proximal right thigh is in keeping with a subcutaneous hematoma at this level. The osseous structures are diffusely osteopenic. Grade 1 anterolisthesis L5-S1. Degenerative changes are noted within the hips bilaterally. A 3.1 cm lytic lesion is seen within the left femoral neck demonstrating a small sinus tract anteriorly, unchanged from prior examination of 05/31/2019. No associated pathologic fracture. IMPRESSION: Acute minimally displaced fracture of the right acromion. Acute comminuted angulated intratrochanteric fracture of the right hip with moderate surrounding intramuscular and interstitial hemorrhage. Extensive multi-vessel coronary artery calcification. Mild global cardiomegaly. 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 No obstruction. Moderate stool throughout the colon without evidence of obstruction. Status post Roux-en-Y gastric bypass. Marked diffuse osteopenia. 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 No associated pathologic fracture. These results were called by telephone at the time of interpretation on 05/15/2021 at 12:00 am to provider Reather Laurence , who verbally acknowledged these results. Electronically Signed   By:  Fidela Salisbury M.D.   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 Electronically Signed   By: Iven Finn M.D.   On: 05/14/2021 23:27   Result Date: 05/14/2021 CLINICAL DATA:  trauma EXAM: PORTABLE CHEST 1 VIEW.  Patient is rotated. COMPARISON:  CT heart 11/11/2020 FINDINGS: Left chest wall 2 lead cardiac pacemaker in grossly appropriate position. Cardiomegaly. The heart and mediastinal contours are unchanged. Query Amplatzer device overlying the heart. Slightly elevated left hemidiaphragm. Left base atelectasis. No focal consolidation. No pulmonary edema. No pleural effusion. No pneumothorax. No acute osseous abnormality. IMPRESSION: No active disease. Electronically Signed: By: Iven Finn M.D. On: 05/14/2021 23:14   DG C-Arm 1-60 Min-No Report  Result Date: 05/15/2021 Fluoroscopy was utilized by the requesting physician.  No radiographic interpretation.   DG C-Arm 1-60 Min-No Report  Result Date: 05/15/2021 Fluoroscopy was utilized by the requesting physician.  No radiographic interpretation.   DG FEMUR, MIN 2 VIEWS RIGHT  Result Date: 05/15/2021 CLINICAL DATA:  Elective surgery. EXAM: RIGHT FEMUR 2 VIEWS COMPARISON:  Right femur radiographs earlier today FLUOROSCOPY: Fluoroscopy Time: 84 seconds Radiation Exposure Index: 11.52 mGy FINDINGS: Four intraoperative spot fluoroscopic images are provided and demonstrate placement of an intramedullary nail and proximal and distal interlocking screws for treatment of the previously shown intertrochanteric fracture which now demonstrates improved alignment. IMPRESSION: Intraoperative images during ORIF of a right femoral intertrochanteric fracture. Electronically Signed   By: Logan Bores M.D.   On: 05/15/2021 16:31   DG FEMUR, MIN 2 VIEWS RIGHT  Result Date: 05/15/2021 CLINICAL DATA:   Fracture. EXAM: RIGHT FEMUR 2 VIEWS COMPARISON:  None. FINDINGS: There is a minimally displaced comminuted intertrochanteric fracture of the proximal right femur. The remaining bony structures are intact. No dislocation. Mild degenerative changes are present at the knee. The soft tissues are unremarkable. IMPRESSION: Minimally displaced comminuted intertrochanteric fracture of the right hip. Electronically Signed   By: Brett Fairy M.D.   On: 05/15/2021 01:22   DG FEMUR PORT, MIN 2 VIEWS RIGHT  Result Date: 05/15/2021 CLINICAL DATA:  Postop femur fracture ORIF EXAM: RIGHT FEMUR PORTABLE 2 VIEW COMPARISON:  05/15/2021 FINDINGS: Intertrochanteric fracture on the right in satisfactory alignment. Compression screw and locking rod in the right femur in good position. Right hip joint normal. IMPRESSION: Satisfactory ORIF right intertrochanteric fracture. No immediate complication. Electronically Signed   By: Franchot Gallo M.D.   On: 05/15/2021 19:05    DISCHARGE EXAMINATION: Vitals:   05/19/21 0529 05/19/21 1544 05/19/21 1957 05/20/21 0721  BP: 99/62 95/64 (!) 97/58 114/79  Pulse: 82 80 80 82  Resp: 16 18 17 17   Temp: (!) 97.5 F (36.4 C) 98.1 F (36.7 C) 98.7 F (37.1 C) (!) 97.5 F (36.4 C)  TempSrc:   Oral Oral  SpO2: 100% 100% 100% 100%  Weight:      Height:       General appearance: Awake alert.  In no distress Resp: Clear to auscultation bilaterally.  Normal effort Cardio: S1-S2 is normal regular.  No S3-S4.  No rubs murmurs or bruit GI: Abdomen is soft.  Nontender nondistended.  Bowel sounds are present normal.  No masses organomegaly   DISPOSITION: CIR     Current Inpatient Medications: Scheduled:  acidophilus  1 capsule Oral Daily   vitamin C  250 mg Oral BID   aspirin EC  81 mg Oral Daily   bisacodyl  10 mg Rectal Once   buprenorphine-naloxone  1 tablet Sublingual Daily  calcium carbonate  1 tablet Oral TID WC   cholecalciferol  2,000 Units Oral BID   clopidogrel  75 mg  Oral Daily   doxepin  10 mg Oral QHS   DULoxetine  60 mg Oral Daily   And   DULoxetine  30 mg Oral QHS   enoxaparin (LOVENOX) injection  30 mg Subcutaneous Q12H   ferrous sulfate  325 mg Oral Q M,W,F   folic acid  1 mg Oral Daily   galantamine  16 mg Oral QHS   hydrOXYzine  20 mg Oral QHS   lipase/protease/amylase  36,000 Units Oral TID WC   memantine  10 mg Oral Daily   And   memantine  20 mg Oral QHS   multivitamin with minerals  1 tablet Oral Daily   nutrition supplement (JUVEN)  1 packet Oral BID BM   pantoprazole  40 mg Oral QHS   polyethylene glycol  17 g Oral Daily   senna-docusate  2 tablet Oral QHS   sodium chloride  1 spray Each Nare QHS   vitamin B-12  50 mcg Oral Daily   zinc sulfate  220 mg Oral Daily   Continuous:  methocarbamol (ROBAXIN) IV     [id number], bisacodyl, calcium carbonate, diclofenac Sodium, fluticasone, HYDROcodone-acetaminophen, HYDROcodone-acetaminophen, magnesium hydroxide, menthol-cetylpyridinium **OR** phenol, methocarbamol **OR** methocarbamol (ROBAXIN) IV, metoCLOPramide **OR** metoCLOPramide (REGLAN) injection, morphine injection, ondansetron **OR** ondansetron (ZOFRAN) IV, polyethylene glycol, polyvinyl alcohol, simethicone     Follow-up Information     Georgeanna Harrison, MD. Schedule an appointment as soon as possible for a visit in 2 week(s).   Specialty: Orthopedic Surgery Contact information: [address] [place] [address] [phone]                 TOTAL DISCHARGE TIME: 63 minutes  [place] Pager on [email or web address]  05/20/2021, 8:42 AM

## 2021-05-20 NOTE — Progress Notes (Signed)
Report called to 5C10 RN.  All questions answered.   ?

## 2021-05-21 ENCOUNTER — Inpatient Hospital Stay (HOSPITAL_COMMUNITY): Payer: No Typology Code available for payment source

## 2021-05-21 DIAGNOSIS — M7989 Other specified soft tissue disorders: Secondary | ICD-10-CM

## 2021-05-21 LAB — COMPREHENSIVE METABOLIC PANEL
ALT: 10 U/L (ref 0–44)
AST: 22 U/L (ref 15–41)
Albumin: 2.7 g/dL — ABNORMAL LOW (ref 3.5–5.0)
Alkaline Phosphatase: 92 U/L (ref 38–126)
Anion gap: 7 (ref 5–15)
BUN: 26 mg/dL — ABNORMAL HIGH (ref 8–23)
CO2: 33 mmol/L — ABNORMAL HIGH (ref 22–32)
Calcium: 8.3 mg/dL — ABNORMAL LOW (ref 8.9–10.3)
Chloride: 95 mmol/L — ABNORMAL LOW (ref 98–111)
Creatinine, Ser: 0.66 mg/dL (ref 0.61–1.24)
GFR, Estimated: >60 mL/min (ref >60)
Glucose, Bld: 94 mg/dL (ref 70–99)
Potassium: 3.8 mmol/L (ref 3.5–5.1)
Sodium: 135 mmol/L (ref 135–145)
Total Bilirubin: 1.7 mg/dL — ABNORMAL HIGH (ref 0.3–1.2)
Total Protein: 5.2 g/dL — ABNORMAL LOW (ref 6.5–8.1)

## 2021-05-21 LAB — CBC WITH DIFFERENTIAL/PLATELET
Abs Immature Granulocytes: 0.02 10*3/uL (ref 0.00–0.07)
Basophils Absolute: 0 10*3/uL (ref 0.0–0.1)
Basophils Relative: 1 %
Eosinophils Absolute: 0.2 10*3/uL (ref 0.0–0.5)
Eosinophils Relative: 6 %
HCT: 25.6 % — ABNORMAL LOW (ref 39.0–52.0)
Hemoglobin: 8.1 g/dL — ABNORMAL LOW (ref 13.0–17.0)
Immature Granulocytes: 1 %
Lymphocytes Relative: 25 %
Lymphs Abs: 0.8 10*3/uL (ref 0.7–4.0)
MCH: 32 pg (ref 26.0–34.0)
MCHC: 31.6 g/dL (ref 30.0–36.0)
MCV: 101.2 fL — ABNORMAL HIGH (ref 80.0–100.0)
Monocytes Absolute: 0.6 10*3/uL (ref 0.1–1.0)
Monocytes Relative: 17 %
Neutro Abs: 1.7 10*3/uL (ref 1.7–7.7)
Neutrophils Relative %: 50 %
Platelets: 162 10*3/uL (ref 150–400)
RBC: 2.53 MIL/uL — ABNORMAL LOW (ref 4.22–5.81)
RDW: 16.4 % — ABNORMAL HIGH (ref 11.5–15.5)
WBC: 3.3 10*3/uL — ABNORMAL LOW (ref 4.0–10.5)
nRBC: 0 % (ref 0.0–0.2)

## 2021-05-21 NOTE — Progress Notes (Signed)
Inpatient Rehabilitation  Patient information reviewed and entered into eRehab system by Esaw Knippel M. Trendon Zaring, M.A., CCC/SLP, PPS Coordinator.  Information including medical coding, functional ability and quality indicators will be reviewed and updated through discharge.    

## 2021-05-21 NOTE — Plan of Care (Signed)
?  Problem: Sit to Stand ?Goal: LTG:  Patient will perform sit to stand with assistance level (PT) ?Description: LTG:  Patient will perform sit to stand with assistance level (PT) ?Flowsheets (Taken 05/21/2021 1235) ?LTG: PT will perform sit to stand in preparation for functional mobility with assistance level: Independent with assistive device ?  ?Problem: RH Bed Mobility ?Goal: LTG Patient will perform bed mobility with assist (PT) ?Description: LTG: Patient will perform bed mobility with assistance, with/without cues (PT). ?Flowsheets (Taken 05/21/2021 1235) ?LTG: Pt will perform bed mobility with assistance level of: Independent with assistive device  ?  ?Problem: RH Bed to Chair Transfers ?Goal: LTG Patient will perform bed/chair transfers w/assist (PT) ?Description: LTG: Patient will perform bed to chair transfers with assistance (PT). ?Flowsheets (Taken 05/21/2021 1235) ?LTG: Pt will perform Bed to Chair Transfers with assistance level: Independent with assistive device  ?  ?Problem: RH Car Transfers ?Goal: LTG Patient will perform car transfers with assist (PT) ?Description: LTG: Patient will perform car transfers with assistance (PT). ?Flowsheets (Taken 05/21/2021 1235) ?LTG: Pt will perform car transfers with assist:: Supervision/Verbal cueing ?  ?Problem: RH Ambulation ?Goal: LTG Patient will ambulate in controlled environment (PT) ?Description: LTG: Patient will ambulate in a controlled environment, # of feet with assistance (PT). ?Flowsheets (Taken 05/21/2021 1235) ?LTG: Pt will ambulate in controlled environ  assist needed:: Supervision/Verbal cueing ?LTG: Ambulation distance in controlled environment: 150 ft with LRAD ?Goal: LTG Patient will ambulate in home environment (PT) ?Description: LTG: Patient will ambulate in home environment, # of feet with assistance (PT). ?Flowsheets (Taken 05/21/2021 1235) ?LTG: Pt will ambulate in home environ  assist needed:: Supervision/Verbal cueing ?LTG: Ambulation distance in  home environment: 75 ft with LRAD ?  ?Problem: RH Wheelchair Mobility ?Goal: LTG Patient will propel w/c in controlled environment (PT) ?Description: LTG: Patient will propel wheelchair in controlled environment, # of feet with assist (PT) ?Flowsheets (Taken 05/21/2021 1235) ?LTG: Pt will propel w/c in controlled environ  assist needed:: Independent with assistive device ?LTG: Propel w/c distance in controlled environment: 150 ft ?Goal: LTG Patient will propel w/c in home environment (PT) ?Description: LTG: Patient will propel wheelchair in home environment, # of feet with assistance (PT). ?Flowsheets (Taken 05/21/2021 1235) ?LTG: Pt will propel w/c in home environ  assist needed:: Independent with assistive device ?LTG: Propel w/c distance in home environment: 75 ft ?  ?

## 2021-05-21 NOTE — Evaluation (Signed)
Occupational Therapy Assessment and Plan  Patient Details  Name: Jack Neal MRN: [id number] Date of Birth: Dec 25, 1945  OT Diagnosis: abnormal posture, acute pain, muscle weakness (generalized), and pain in joint Rehab Potential: Rehab Potential (ACUTE ONLY): Good ELOS: 12-14 days   Today's Date: 05/21/2021 OT Individual Time: 1300-1405 OT Individual Time Calculation (min): 65 min     Hospital Problem: Principal Problem:   Intertrochanteric fracture of right hip ([place])   Past Medical History:  Past Medical History:  Diagnosis Date   AF (paroxysmal atrial fibrillation) ([place]) 10/01/2020   Atrial fibrillation ([name])    Celiac disease 10/01/2020   Chronic pain syndrome 10/01/2020   Depression    GERD (gastroesophageal reflux disease)    History of gastric bypass 10/01/2020   Hypotension    Major depressive disorder 10/01/2020   Neuropathy    OSA on CPAP 10/01/2020   Pacemaker    Peripheral polyneuropathy 10/01/2020   Presence of cardiac pacemaker 10/01/2020   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   Watchman FLX 20   PTSD (post-traumatic stress disorder) 10/01/2020   Past Surgical History:  Past Surgical History:  Procedure Laterality Date   INTRAMEDULLARY (IM) NAIL INTERTROCHANTERIC Right 05/15/2021   Procedure: INTRAMEDULLARY (IM) NAIL INTERTROCHANTRIC;  Surgeon: Georgeanna Harrison, MD;  Location: [facility];  Service: Orthopedics;  Laterality: Right;   LEFT ATRIAL APPENDAGE OCCLUSION N/A 01/02/2021   Procedure: LEFT ATRIAL APPENDAGE OCCLUSION;  Surgeon: Vickie Epley, MD;  Location: [facility];  Service: Cardiovascular;  Laterality: N/A;   TEE WITHOUT CARDIOVERSION N/A 01/02/2021   Procedure: TRANSESOPHAGEAL ECHOCARDIOGRAM (TEE);  Surgeon: Vickie Epley, MD;  Location: [facility];  Service: Cardiovascular;  Laterality: N/A;   TEE WITHOUT CARDIOVERSION N/A 02/17/2021   Procedure: TRANSESOPHAGEAL ECHOCARDIOGRAM (TEE);  Surgeon:  Geralynn Rile, MD;  Location: [facility];  Service: Cardiovascular;  Laterality: N/A;    Assessment & Plan Clinical Impression: Muneeb Veras is a 76 year old right-handed/ambidextrous male with history of PAF/pacemaker maintained on aspirin and Plavix, presence of watchman left atrial appendage closure device placed October 2022, PTSD, mild dementia maintained on Namenda as well as Razadyne, chronic pain syndrome/peripheral polyneuropathy maintained on Suboxone followed by Dr.[email or web address] at the [facility] [phone] extension [phone], OSA on CPAP, hypertension, tobacco use.  Per chart review patient lives with spouse.  1 level home.  Wife can provide very little physical assistance.  Patient was independent with a rollator prior to admission.  He does not drive.  Presented 05/14/2021 after mechanical fall without loss of consciousness landing on his right side.  Cranial CT scan negative.  CT cervical spine negative.  CT chest abdomen pelvis showed acute minimally displaced fracture of the right acromion.  Acute comminuted angulated intertrochanteric fracture of the right hip with moderate surrounding intramuscular and interstitial hemorrhage.  Patient underwent open treatment of right intertrochanteric femur fracture with intramedullary implant with interlocking screws 05/15/2021 per Dr. Mable Fill.  Patient is weightbearing as tolerated.  Conservative care of right acromion fracture limited abduction and forward flexion to 90 degrees due to fracture.  Placed on Lovenox for DVT prophylaxis.  He remains on aspirin and Plavix as prior to admission.  Acute blood loss anemia hemoglobin 8.2.  Transfused 1 unit packed red blood cells 3/4.  Therapy evaluations completed due to patient decreased functional mobility was admitted for a comprehensive rehab program. Patient transferred to CIR on 05/20/2021 .    Patient currently requires mod with basic self-care skills secondary to  muscle weakness and  decreased sitting balance, decreased standing balance, decreased postural control, and decreased balance strategies.  Prior to hospitalization, patient could complete ADLs/IADLs with modified independence and use of rollator.  Patient will benefit from skilled intervention to decrease level of assist with basic self-care skills and increase independence with basic self-care skills prior to discharge home with care partner.  Anticipate patient will require intermittent supervision and follow up home health.  OT - End of Session Endurance Deficit: Yes Endurance Deficit Description: frequent rest breaks OT Assessment Rehab Potential (ACUTE ONLY): Good OT Barriers to Discharge: Decreased caregiver support OT Barriers to Discharge Comments: Wife only able to provide supervision A. OT Patient demonstrates impairments in the following area(s): Balance;Endurance;Pain;Skin Integrity OT Basic ADL's Functional Problem(s): Grooming;Bathing;Dressing;Toileting OT Transfers Functional Problem(s): Toilet;Tub/Shower OT Plan OT Intensity: Minimum of 1-2 x/day, 45 to 90 minutes OT Frequency: 5 out of 7 days OT Duration/Estimated Length of Stay: 12-14 days OT Treatment/Interventions: Medical illustrator training;Community reintegration;Discharge planning;DME/adaptive equipment instruction;Functional mobility training;Pain management;Patient/family education;Psychosocial support;Self Care/advanced ADL retraining;Skin care/wound managment;Therapeutic Activities;Therapeutic Exercise;UE/LE Strength taining/ROM OT Self Feeding Anticipated Outcome(s): N/A OT Basic Self-Care Anticipated Outcome(s): Mod I to S OT Toileting Anticipated Outcome(s): S OT Bathroom Transfers Anticipated Outcome(s): S OT Recommendation Patient destination: Home Follow Up Recommendations: Home health OT Equipment Recommended: To be determined   OT Evaluation Precautions/Restrictions  Precautions Precautions: Fall Restrictions Weight  Bearing Restrictions: Yes RUE Weight Bearing: Weight bearing as tolerated RLE Weight Bearing: Weight bearing as tolerated Other Position/Activity Restrictions: RUE shoulder </ 90' flexion and abduction General Chart Reviewed: Yes Additional Pertinent History: PMHx significant for PAF/pacemaker, watchman, PTSD, mild dementia, peripheral neuropathy, chronic pain syndrome, OSA on CPAP, HTN and tobacco use. Family/Caregiver Present: No Vital Signs Therapy Vitals Temp: (!) 97.4 F (36.3 C) Temp Source: Oral Pulse Rate: 81 Resp: 16 BP: 121/77 Patient Position (if appropriate): Sitting Oxygen Therapy SpO2: 100 % O2 Device: Room Air Pain Pain Assessment Pain Scale: 0-10 Pain Score: 7  Pain Type: Surgical pain Pain Location: Hip Pain Orientation: Right Pain Descriptors / Indicators: Aching;Constant Pain Onset: On-going Home Living/Prior Functioning Home Living Family/patient expects to be discharged to:: Private residence Living Arrangements: Spouse/significant other Available Help at Discharge: Family, Available 24 hours/day Type of Home: House Home Access: Level entry Home Layout: One level Bathroom Shower/Tub: Government social research officer Accessibility: Yes Additional Comments: reports wife not able to physically assist with mobility, but could help some with ADLs  Lives With: Spouse Prior Function Level of Independence: Independent with gait, Independent with transfers, Requires assistive device for independence  Able to Take Stairs?: No Driving: No Vocation: Retired Surveyor, mining Baseline Vision/History: 1 Wears glasses Ability to See in Adequate Light: 0 Adequate Patient Visual Report: No change from baseline Vision Assessment?: No apparent visual deficits Perception  Perception: Within Functional Limits Praxis Praxis: Intact Cognition Overall Cognitive Status: Within Functional Limits for tasks assessed Arousal/Alertness: Awake/alert Orientation  Level: Person;Place;Situation Person: Oriented Place: Oriented Situation: Oriented Year: 2023 Month: March Day of Week: Correct Memory: Appears intact Immediate Memory Recall: Sock;Blue;Bed Memory Recall Sock: Without Cue Memory Recall Blue: Without Cue Memory Recall Bed: Without Cue Attention: Focused;Sustained Focused Attention: Appears intact Sustained Attention: Appears intact Awareness: Appears intact Problem Solving: Appears intact Safety/Judgment: Appears intact Sensation Sensation Light Touch: Appears Intact Peripheral sensation comments: Intact in BUE Light Touch Impaired Details: Impaired RLE;Impaired LLE Hot/Cold: Appears Intact Proprioception: Appears Intact Coordination Gross Motor Movements are Fluid and Coordinated: No Fine Motor Movements are Fluid and Coordinated:  Yes Coordination and Movement Description: Mild deficits in BUE Finger Nose Finger Test: Intact Heel Shin Test: not tested 2/2 pain Motor  Motor Motor: Abnormal postural alignment and control Motor - Skilled Clinical Observations: impaired 2/2 pain  Trunk/Postural Assessment  Cervical Assessment Cervical Assessment: Exceptions to [facility] (forward head) Thoracic Assessment Thoracic Assessment: Exceptions to [facility] (rounded shoulders) Lumbar Assessment Lumbar Assessment: Exceptions to [facility] (posterior pelvic tilt) Postural Control Postural Control: Deficits on evaluation Righting Reactions: impaired/delayed  Balance Balance Balance Assessed: Yes Static Sitting Balance Static Sitting - Balance Support: No upper extremity supported;Feet supported Static Sitting - Level of Assistance: 5: Stand by assistance Dynamic Sitting Balance Dynamic Sitting - Balance Support: No upper extremity supported;Feet supported;During functional activity Dynamic Sitting - Level of Assistance: 5: Stand by assistance Static Standing Balance Static Standing - Balance Support: Bilateral upper extremity supported;During  functional activity Static Standing - Level of Assistance: 4: Min assist Dynamic Standing Balance Dynamic Standing - Balance Support: Bilateral upper extremity supported;During functional activity Dynamic Standing - Level of Assistance: 4: Min assist Extremity/Trunk Assessment RUE Assessment RUE Assessment: Within Functional Limits LUE Assessment LUE Assessment: Within Functional Limits  Care Tool Care Tool Self Care Eating   Eating Assist Level: Independent    Oral Care    Oral Care Assist Level: Set up assist (sitting at sink)    Bathing Bathing activity did not occur: Refused            Upper Body Dressing(including orthotics)   What is the patient wearing?: Pull over shirt   Assist Level: Minimal Assistance - Patient > 75%    Lower Body Dressing (excluding footwear)   What is the patient wearing?: Underwear/pull up;Pants Assist for lower body dressing: Maximal Assistance - Patient 25 - 49%    Putting on/Taking off footwear   What is the patient wearing?: Socks;Shoes Assist for footwear: Total Assistance - Patient < 25%       Care Tool Toileting Toileting activity Toileting Activity did not occur (Clothing management and hygiene only): Refused       Care Tool Bed Mobility Roll left and right activity   Roll left and right assist level: Minimal Assistance - Patient > 75%    Sit to lying activity   Sit to lying assist level: Minimal Assistance - Patient > 75%    Lying to sitting on side of bed activity   Lying to sitting on side of bed assist level: the ability to move from lying on the back to sitting on the side of the bed with no back support.: Minimal Assistance - Patient > 75%     Care Tool Transfers Sit to stand transfer   Sit to stand assist level: Minimal Assistance - Patient > 75%    Chair/bed transfer   Chair/bed transfer assist level: Minimal Assistance - Patient > 75%     Toilet transfer         Care Tool Cognition  Expression of Ideas  and Wants Expression of Ideas and Wants: 4. Without difficulty (complex and basic) - expresses complex messages without difficulty and with speech that is clear and easy to understand  Understanding Verbal and Non-Verbal Content Understanding Verbal and Non-Verbal Content: 4. Understands (complex and basic) - clear comprehension without cues or repetitions   Memory/Recall Ability Memory/Recall Ability : Current season;That he or she is in a hospital/hospital unit   Refer to Care Plan for [place] 1 OT Short Term Goal 1 (  Week 1): Patient will complete LB bathing with Min A and use of AE/DME. OT Short Term Goal 2 (Week 1): Patient will don UB clothing with set-up assist. OT Short Term Goal 3 (Week 1): Patient will complete 3/3 parts of toileting task with Min A and LRAD. OT Short Term Goal 4 (Week 1): Patient will complete toilet transfer with Min A and LRAD.  Recommendations for other services: Other: TBD    Skilled Therapeutic Intervention Patient met lying supine in bed in agreement with OT treatment session. 7/10 pain reported in R hip s/p med administration from RN. Patient declined toileting and bathing but participated in UB/LB bathing/dressing and grooming seated at sink level. Please refer below for additional details. Patient limited by deficits listed above and would benefit from continued occupational therapy services in prep for safe d/c home at Mod I to S level. Session concluded with patient seated in recliner with call bell within reach, belt alarm activated and all needs met.  ADL ADL Eating: Independent Where Assessed-Eating: Chair Grooming: Setup Where Assessed-Grooming: Sitting at sink Upper Body Bathing: Not assessed Lower Body Bathing: Not assessed Upper Body Dressing: Minimal assistance Where Assessed-Upper Body Dressing: Edge of bed Lower Body Dressing: Maximal assistance Where Assessed-Lower Body Dressing: Edge of bed Toileting: Not  assessed Toilet Transfer Method: Not assessed Tub/Shower Transfer: Not assessed ADL Comments: Declined bathing and toileting at time of initial evaluation. Mobility  Bed Mobility Bed Mobility: Rolling Right;Rolling Left;Supine to Sit;Sit to Supine Rolling Right: Minimal Assistance - Patient > 75% Rolling Left: Minimal Assistance - Patient > 75% Supine to Sit: Minimal Assistance - Patient > 75% Sit to Supine: Minimal Assistance - Patient > 75% Transfers Sit to Stand: Minimal Assistance - Patient > 75%   Discharge Criteria: Patient will be discharged from OT if patient refuses treatment 3 consecutive times without medical reason, if treatment goals not met, if there is a change in medical status, if patient makes no progress towards goals or if patient is discharged from hospital.  The above assessment, treatment plan, treatment alternatives and goals were discussed and mutually agreed upon: by patient  Geneal Huebert R [place]-Davis 05/21/2021, 3:01 PM

## 2021-05-21 NOTE — Progress Notes (Signed)
Occupational Therapy Session Note ? ?Patient Details  ?Name: Jack Neal ?MRN: [id number] ?Date of Birth: [birth year] ? ?Today's Date: 05/21/2021 ?OT Individual Time: 1643-5391 ?OT Individual Time Calculation (min): 65 min  ? ? ?Short Term Goals: ?Week 1:    ? ?Skilled Therapeutic Interventions/Progress Updates:  ?  Pt resting in recliner upon arrival. Sit<>stand with min A and amb in room with min A approx 10'. Pt stood to use urinal. CGA for standing balance while pt used urinal. Pt reports that he has been experiencing dizziness for a "few years" and has been cause of falls in past. Pt has all DME and hip kit already. Pt remained in w/c with all needs within reach. Bed alarm activated.  ? ?Therapy Documentation ?Precautions:  ?Precautions ?Precautions: Fall ?Restrictions ?Weight Bearing Restrictions: Yes ?RUE Weight Bearing: Weight bearing as tolerated ?RLE Weight Bearing: Weight bearing as tolerated ?Other Position/Activity Restrictions: RUE shoulder </ 90' flexion and abduction ?Pain: ? Pt reports 7/10 Rt hip pain; repositioned and ice applied ? ? ?Therapy/Group: Individual Therapy ? ?[name] ?05/21/2021, 2:40 PM ?

## 2021-05-21 NOTE — Evaluation (Signed)
Physical Therapy Assessment and Plan  Patient Details  Name: Jack Neal MRN: [id number] Date of Birth: Aug 27, 1945  PT Diagnosis: Abnormality of gait, Difficulty walking, Muscle weakness, and Pain in joint Rehab Potential: Good ELOS: 10-14 days   Today's Date: 05/21/2021 PT Individual Time: 4235-3614 PT Individual Time Calculation (min): 60 min    Hospital Problem: Principal Problem:   Intertrochanteric fracture of right hip [facility])   Past Medical History:  Past Medical History:  Diagnosis Date   AF (paroxysmal atrial fibrillation) ([place]) 10/01/2020   Atrial fibrillation ([place])    Celiac disease 10/01/2020   Chronic pain syndrome 10/01/2020   Depression    GERD (gastroesophageal reflux disease)    History of gastric bypass 10/01/2020   Hypotension    Major depressive disorder 10/01/2020   Neuropathy    OSA on CPAP 10/01/2020   Pacemaker    Peripheral polyneuropathy 10/01/2020   Presence of cardiac pacemaker 10/01/2020   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   Watchman FLX 20   PTSD (post-traumatic stress disorder) 10/01/2020   Past Surgical History:  Past Surgical History:  Procedure Laterality Date   INTRAMEDULLARY (IM) NAIL INTERTROCHANTERIC Right 05/15/2021   Procedure: INTRAMEDULLARY (IM) NAIL INTERTROCHANTRIC;  Surgeon: Georgeanna Harrison, MD;  Location: [place];  Service: Orthopedics;  Laterality: Right;   LEFT ATRIAL APPENDAGE OCCLUSION N/A 01/02/2021   Procedure: LEFT ATRIAL APPENDAGE OCCLUSION;  Surgeon: Vickie Epley, MD;  Location: [place] [facility];  Service: Cardiovascular;  Laterality: N/A;   TEE WITHOUT CARDIOVERSION N/A 01/02/2021   Procedure: TRANSESOPHAGEAL ECHOCARDIOGRAM (TEE);  Surgeon: Vickie Epley, MD;  Location: [place] [facility];  Service: Cardiovascular;  Laterality: N/A;   TEE WITHOUT CARDIOVERSION N/A 02/17/2021   Procedure: TRANSESOPHAGEAL ECHOCARDIOGRAM (TEE);  Surgeon: Geralynn Rile, MD;  Location: [place];  Service: Cardiovascular;  Laterality: N/A;    Assessment & Plan Clinical Impression:  Jack Neal is a 76 year old right-handed/ambidextrous male with history of PAF/pacemaker maintained on aspirin and Plavix, presence of watchman left atrial appendage closure device placed October 2022, PTSD, mild dementia maintained on Namenda as well as Razadyne, chronic pain syndrome/peripheral polyneuropathy maintained on Suboxone followed by Dr.IHEAGWARA at the [facility] [phone] extension [phone], OSA on CPAP, hypertension, tobacco use.  Per chart review patient lives with spouse.  1 level home.  Wife can provide very little physical assistance.  Patient was independent with a rollator prior to admission.  He does not drive.  Presented 05/14/2021 after mechanical fall without loss of consciousness landing on his right side.  Cranial CT scan negative.  CT cervical spine negative.  CT chest abdomen pelvis showed acute minimally displaced fracture of the right acromion.  Acute comminuted angulated intertrochanteric fracture of the right hip with moderate surrounding intramuscular and interstitial hemorrhage.  Patient underwent open treatment of right intertrochanteric femur fracture with intramedullary implant with interlocking screws 05/15/2021 per Dr. Mable Fill.  Patient is weightbearing as tolerated.  Conservative care of right acromion fracture limited abduction and forward flexion to 90 degrees due to fracture.  Placed on Lovenox for DVT prophylaxis.  He remains on aspirin and Plavix as prior to admission.  Acute blood loss anemia hemoglobin 8.2.  Transfused 1 unit packed red blood cells 3/4.  Therapy evaluations completed due to patient decreased functional mobility was admitted for a comprehensive rehab program. Patient transferred to CIR on 05/20/2021 .   Patient currently requires min with mobility secondary to muscle weakness and muscle joint tightness, decreased  cardiorespiratoy  endurance, and decreased standing balance, decreased postural control, and decreased balance strategies.  Prior to hospitalization, patient was modified independent  with mobility and lived with Spouse in a House home.  Home access is  Level entry.  Patient will benefit from skilled PT intervention to maximize safe functional mobility, minimize fall risk, and decrease caregiver burden for planned discharge home with 24 hour supervision.  Anticipate patient will benefit from follow up [place] at discharge.  PT - End of Session Activity Tolerance: Tolerates 30+ min activity with multiple rests Endurance Deficit: Yes Endurance Deficit Description: frequent rest breaks PT Assessment Rehab Potential (ACUTE/IP ONLY): Good PT Barriers to Discharge: Decreased caregiver support PT Patient demonstrates impairments in the following area(s): Balance;Endurance;Pain;Sensory PT Transfers Functional Problem(s): Bed Mobility;Bed to Chair;Car;Furniture;Floor PT Locomotion Functional Problem(s): Ambulation;Wheelchair Mobility;Stairs PT Plan PT Intensity: Minimum of 1-2 x/day ,45 to 90 minutes PT Frequency: 5 out of 7 days PT Duration Estimated Length of Stay: 10-14 days PT Treatment/Interventions: Ambulation/gait training;Balance/vestibular training;Community reintegration;Discharge planning;Disease management/prevention;DME/adaptive equipment instruction;Functional mobility training;Pain management;Patient/family education;Therapeutic Activities;Therapeutic Exercise;UE/LE Strength taining/ROM;UE/LE Coordination activities;Wheelchair propulsion/positioning PT Transfers Anticipated Outcome(s): Supervision PT Locomotion Anticipated Outcome(s): Supervision short distance ambulation, otherwise w/c level PT Recommendation Recommendations for Other Services: Therapeutic Recreation consult Therapeutic Recreation Interventions: Stress management Follow Up Recommendations: Home health PT Patient destination: Home Equipment  Recommended: Rolling walker with 5" wheels;Wheelchair (measurements);Wheelchair cushion (measurements) Equipment Details: 18x18 w/c with ELR   PT Evaluation Precautions/Restrictions Precautions Precautions: Fall Restrictions Weight Bearing Restrictions: Yes RUE Weight Bearing: Weight bearing as tolerated RLE Weight Bearing: Weight bearing as tolerated Other Position/Activity Restrictions: RUE shoulder </ 90' flexion and abduction Pain Interference Pain Interference Pain Effect on Sleep: 3. Frequently Pain Interference with Therapy Activities: 4. Almost constantly Pain Interference with Day-to-Day Activities: 4. 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 Praxis: Intact  Cognition Overall Cognitive Status: Within Functional Limits for tasks assessed Arousal/Alertness: Awake/alert Orientation Level: Oriented X4 Year: 2023 Attention: Focused;Sustained Focused Attention: Appears intact Sustained Attention: Appears intact Memory: Appears intact Awareness: Appears intact Problem Solving: Appears intact Safety/Judgment: Appears intact Sensation Sensation Light Touch: Impaired Detail Peripheral sensation comments: polyneuropathy at baseline (stinging/burning sensation with light touch) Light Touch Impaired Details: Impaired RLE;Impaired LLE Proprioception: Appears Intact Coordination Gross Motor Movements are Fluid and Coordinated: No Fine Motor Movements are Fluid and  Coordinated: Yes Coordination and Movement Description: impaired 2/2 pain Heel Shin Test: not tested 2/2 pain Motor  Motor Motor: Abnormal postural alignment and control Motor - Skilled Clinical Observations: impaired 2/2 pain   Trunk/Postural Assessment  Cervical Assessment Cervical Assessment: Exceptions to [facility] (forward head) Thoracic Assessment Thoracic Assessment: Exceptions to [facility] (rounded shoulders) Lumbar Assessment Lumbar Assessment: Exceptions to [facility] (posterior pelvic tilt) Postural Control Postural Control: Deficits on evaluation Righting Reactions: impaired/delayed  Balance Balance Balance Assessed: Yes Static Sitting Balance Static Sitting - Balance Support: No upper extremity supported;Feet supported Static Sitting - Level of Assistance: 5: Stand by assistance Dynamic Sitting Balance Dynamic Sitting - Balance Support: No upper extremity supported;Feet supported;During functional activity Dynamic Sitting - Level of Assistance: 5: Stand by assistance Static Standing Balance  Static Standing - Balance Support: Bilateral upper extremity supported;During functional activity Static Standing - Level of Assistance: 4: Min assist Dynamic Standing Balance Dynamic Standing - Balance Support: Bilateral upper extremity supported;During functional activity Dynamic Standing - Level of Assistance: 4: Min assist Extremity Assessment      RLE Assessment RLE Assessment: Exceptions to [facility] General Strength Comments: impaired 2/2 pain RLE Strength Right Hip Flexion: 2-/5 Right Knee Flexion: 3/5 Right Knee Extension: 3/5 Right Ankle Dorsiflexion: 5/5 LLE Assessment LLE Assessment: Within Functional Limits General Strength Comments: see below LLE Strength Left Hip Flexion: 4-/5 Left Knee Flexion: 5/5 Left Knee Extension: 5/5 Left Ankle Dorsiflexion: 5/5  Care Tool Care Tool Bed Mobility Roll left and right activity   Roll left and right assist level: Minimal Assistance -  Patient > 75%    Sit to lying activity   Sit to lying assist level: Minimal Assistance - Patient > 75%    Lying to sitting on side of bed activity Lying to sitting on side of bed activity did not occur: the ability to move from lying on the back to sitting on the side of the bed with no back support.: N/A Lying to sitting on side of bed assist level: the ability to move from lying on the back to sitting on the side of the bed with no back support.: Minimal Assistance - Patient > 75%     Care Tool Transfers Sit to stand transfer   Sit to stand assist level: Minimal Assistance - Patient > 75%    Chair/bed transfer   Chair/bed transfer assist level: Minimal Assistance - Patient > 75%     Toilet transfer Toilet transfer activity did not occur: Furniture conservator/restorer transfer assist level: Minimal Assistance - Patient > 75%      Care Tool Locomotion Ambulation   Assist level: Minimal Assistance - Patient > 75% Assistive device: Walker-rolling    Walk 10 feet activity   Assist level: Minimal Assistance - Patient > 75% Assistive device: Walker-rolling   Walk 50 feet with 2 turns activity Walk 50 feet with 2 turns activity did not occur: Safety/medical concerns      Walk 150 feet activity Walk 150 feet activity did not occur: Safety/medical concerns      Walk 10 feet on uneven surfaces activity Walk 10 feet on uneven surfaces activity did not occur: Safety/medical concerns      Stairs Stair activity did not occur: Safety/medical concerns        Walk up/down 1 step activity Walk up/down 1 step or curb (drop down) activity did not occur: Safety/medical concerns      Walk up/down 4 steps activity Walk up/down 4 steps activity did not occur: Safety/medical concerns      Walk up/down 12 steps activity Walk up/down 12 steps activity did not occur: Safety/medical concerns      Pick up small objects from floor Pick up small object from the floor (from standing position)  activity did not occur: Safety/medical concerns      Wheelchair Is the patient using a wheelchair?: Yes Type of Wheelchair: Manual   Wheelchair assist level: Supervision/Verbal cueing Max wheelchair distance: 150'  Wheel 50 feet with 2 turns activity   Assist Level: Supervision/Verbal cueing  Wheel 150 feet activity   Assist Level: Supervision/Verbal cueing    Refer to Care Plan for Long Term Goals  SHORT TERM GOAL WEEK 1 PT Short Term Goal 1 (Week 1):  Pt will ambulate x 50 ft with LRAD and min A PT Short Term Goal 2 (Week 1): Pt will perform bed mobility with Supervision PT Short Term Goal 3 (Week 1): Pt will performing objective functional outcome measure  Recommendations for other services: Therapeutic Recreation  Stress management  Skilled Therapeutic Intervention Evaluation completed (see details above and below) with education on PT POC and goals and individual treatment initiated with focus on functional transfer and gait assessment. Pt received seated in recliner in room, agreeable to PT session. Pt reports 7/10 pain at rest in R hip that increases with mobility. Pt premedicated prior to start of therapy session. Utilized ice pack at end of session for pain management. Sit to stand with min A to RW during session, cues for WBAT on RLE to manage pain. Ambulation 2 x 10 ft with RW and min A for balance, antalgic gait pattern with decreased stance time on RLE with decreased step length with LLE. Car transfer with min A needed for RLE management in/out of car. Manual w/c propulsion x 150 ft with use of BUE at Supervision level. Pt returned to recliner at end of session, needs in reach, quick release alarm in place in place. Spoke with patient's wife Audrea Muscat via phone following therapy session per pt's request to update her on pt's CLOF, ELOS, etc.  Mobility Bed Mobility Bed Mobility: Rolling Right;Rolling Left;Supine to Sit;Sit to Supine Rolling Right: Minimal Assistance - Patient >  75% Rolling Left: Minimal Assistance - Patient > 75% Supine to Sit: Minimal Assistance - Patient > 75% Sit to Supine: Minimal Assistance - Patient > 75% Transfers Transfers: Sit to Stand;Stand Pivot Transfers Sit to Stand: Minimal Assistance - Patient > 75% Stand Pivot Transfers: Minimal Assistance - Patient > 75% Stand Pivot Transfer Details: Verbal cues for sequencing;Verbal cues for precautions/safety;Verbal cues for safe use of DME/AE Transfer (Assistive device): Rolling walker Locomotion  Gait Gait Distance (Feet): 10 Feet Assistive device: Rolling walker Gait Gait Pattern: Impaired (antalgic, step-to, dec stance on RLE) Gait velocity: decreased Stairs / Additional Locomotion Stairs: No Wheelchair Mobility Wheelchair Mobility: Yes Wheelchair Assistance: Chartered loss adjuster: Both upper extremities Wheelchair Parts Management: Needs assistance Distance: 150   Discharge Criteria: Patient will be discharged from PT if patient refuses treatment 3 consecutive times without medical reason, if treatment goals not met, if there is a change in medical status, if patient makes no progress towards goals or if patient is discharged from hospital.  The above assessment, treatment plan, treatment alternatives and goals were discussed and mutually agreed upon: by patient and by family   Excell Seltzer, PT, DPT, CSRS 05/21/2021, 12:25 PM
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
## 2021-05-21 NOTE — Progress Notes (Signed)
?                                                       PROGRESS NOTE ? ? ?Subjective/Complaints: ? ?Pt reports a  lot of pain- slept through receiving pain meds through night- slept great, but needs AM meds.  ? ?Asked nurse to bring him his AM mds before therapy at 8am ? ? ?ROS: ? ?Pt denies SOB, abd pain, CP, N/V/C/D, and vision changes ? ? ?Objective: ?  ?No results found. ?Recent Labs  ?  05/20/21 ?0222 05/21/21 ?0540  ?WBC 3.9* 3.3*  ?HGB 8.2* 8.1*  ?HCT 26.3* 25.6*  ?PLT 149* 162  ? ?Recent Labs  ?  05/20/21 ?0222 05/21/21 ?0540  ?NA 136 135  ?K 3.7 3.8  ?CL 96* 95*  ?CO2 34* 33*  ?GLUCOSE 101* 94  ?BUN 23 26*  ?CREATININE 0.65 0.66  ?CALCIUM 8.5* 8.3*  ? ? ?Intake/Output Summary (Last 24 hours) at 05/21/2021 1139 ?Last data filed at 05/20/2021 2100 ?Gross per 24 hour  ?Intake 240 ml  ?Output 1200 ml  ?Net -960 ml  ?  ? ?  ? ?Physical Exam: ?Vital Signs ?Blood pressure 112/60, pulse 87, temperature 98 ?F (36.7 ?C), temperature source Oral, resp. rate 19, height 5\' 11"  (1.803 m), SpO2 100 %. ? ? ?General: awake, alert, appropriate, sitting up in bed; appears in pain- very stiff holding himself still;  NAD ?HENT: conjugate gaze; oropharynx moist ?CV: regular rate; no JVD ?Pulmonary: CTA B/L; no W/R/R- good air movement ?GI: soft, NT, ND, (+)BS ?Psychiatric: appropriate- quiet ?Neurological: Ox3 ?Musculoskeletal:  ?   Cervical back: Neck supple. No tenderness.  ?   Comments: UE strength 5/5 B/L ?LLE- 5/5 in HF, KE, DF and PF ?RLE- 2/5 in proximal RLE; 5/5 distally ?TTP over R lateral hip and groin as well as R acromium  ?Skin: ?   Comments: Right hip incision is dressed. ?Moderate swelling; no erythema; original surgical dressing in place ?Large purple bruise inner upper R arm ?1-2+ LE edema to distal calves B/L ?Venous stasis changes distal 1/3 of calves B/L ?R forearm IV - looks OK  ?Neurological:  ?   Mental Status: He is alert and oriented to person, place, and time.  ?   Comments: Patient is alert.  Makes eye  contact with examiner.  Provides name and age.  Limited medical historian.  Follows commands. ?Decreased sensation to light touch in lower legs bowel knees B/L  ?Otherwise, intact above knees and in arms  ? ?Assessment/Plan: ?1. Functional deficits which require 3+ hours per day of interdisciplinary therapy in a comprehensive inpatient rehab setting. ?Physiatrist is providing close team supervision and 24 hour management of active medical problems listed below. ?Physiatrist and rehab team continue to assess barriers to discharge/monitor patient progress toward functional and medical goals ? ?Care Tool: ? ?Bathing ? Bathing activity did not occur: Refused ?   ?   ?  ?  ?Bathing assist   ?  ?  ?Upper Body Dressing/Undressing ?Upper body dressing   ?What is the patient wearing?: Pull over shirt ?   ?Upper body assist Assist Level: Minimal Assistance - Patient > 75% ?   ?Lower Body Dressing/Undressing ?Lower body dressing ? ? ?   ?What is the patient wearing?: Underwear/pull up, Pants ? ?  ? ?  Lower body assist Assist for lower body dressing: Maximal Assistance - Patient 25 - 49% ?   ? ?Toileting ?Toileting Toileting Activity did not occur Landscape architect and hygiene only): Refused  ?Toileting assist Assist for toileting:  (condom cath) ?  ?  ?Transfers ?Chair/bed transfer ? ?Transfers assist ?   ? ?Chair/bed transfer assist level: Minimal Assistance - Patient > 75% ?  ?  ?Locomotion ?Ambulation ? ? ?Ambulation assist ? ?   ? ?  ?  ?   ? ?Walk 10 feet activity ? ? ?Assist ?   ? ?  ?   ? ?Walk 50 feet activity ? ? ?Assist   ? ?  ?   ? ? ?Walk 150 feet activity ? ? ?Assist   ? ?  ?  ?  ? ?Walk 10 feet on uneven surface  ?activity ? ? ?Assist   ? ? ?  ?   ? ?Wheelchair ? ? ? ? ?Assist   ?  ?  ? ?  ?   ? ? ?Wheelchair 50 feet with 2 turns activity ? ? ? ?Assist ? ?  ?  ? ? ?   ? ?Wheelchair 150 feet activity  ? ? ? ?Assist ?   ? ? ?   ? ?Blood pressure 112/60, pulse 87, temperature 98 ?F (36.7 ?C), temperature source  Oral, resp. rate 19, height 5\' 11"  (1.803 m), SpO2 100 %. ? ?Medical Problem List and Plan: ?1. Functional deficits secondary to acute minimally displaced fracture of right acromion with conservative care/weightbearing as tolerated.  Acute comminuted angulated intertrochanteric fracture of right hip with moderate surrounding intramuscular interstitial hemorrhage.  Status post open treatment with intramedullary implant and interlocking screws 05/15/2021.  Weightbearing as tolerated. ?            -patient may  shower if dressing covered ?            -ELOS/Goals: 10-14 days supervision ? First day of evaluations- con't CIR- PT and OT ?2.  Antithrombotics: ?-DVT/anticoagulation:  Pharmaceutical: Lovenox x6 weeks.  Check vascular study ?            -antiplatelet therapy: Aspirin 81 mg daily and Plavix 75 mg daily ?3. Pain Management/chronic pain/peripheral polyneuropathy: Maintained on Suboxone 2-0.5 mg 1 tablet daily as prior to admission followed by Dr.IHEAGWARA at the [facility] [phone] extension [phone], Voltaren gel as needed, Robaxin as needed, hydrocodone as needed ? 3/8- needs to wake up at night to get meds so doesn't get behind-  ?4. Mood/PTSD with dementia: Cymbalta 60 mg daily and 30 mg nightly, Namenda 10 mg daily and 20 mg nightly,Razadyne 16 mg nightly ?            -antipsychotic agents: N/A ?5. Neuropsych: This patient is capable of making decisions on his own behalf. ?6. Skin/Wound Care: Routine skin checks.  Keep Aquacel on and dry for up to 14 days. ?7. Fluids/Electrolytes/Nutrition: Routine in and outs with follow-up chemistries ?8.  Hypertension.  Monitor with increased mobility ?9.  OSA.  CPAP ?10.  Acute blood loss anemia.  S/p 2 units pRBCs. Follow-up CBC.  Continue iron supplement ?11.  Atrial fibrillation/pacemaker/watchman.  Cardiac rate controlled. ?12.  Pancreatic insufficiency.  Pancrelipase ?13. GERD_ just restarted Porotonix- having a lot of GERD Sx's-  con't tums prn ? 3/8- reflux better this AM- con't regimen ?  ? ?LOS: ?1 days ?A FACE TO FACE EVALUATION WAS PERFORMED ? ?Shimeka Bacot ?05/21/2021, 11:39 AM  ? ? ? ?

## 2021-05-21 NOTE — Progress Notes (Signed)
Lower extremity venous bilateral study completed.   Please see CV Proc for preliminary results.   Moria Brophy, RDMS, RVT  

## 2021-05-22 MED ORDER — MORPHINE SULFATE 15 MG PO TABS
15.0000 mg | ORAL_TABLET | ORAL | Status: DC | PRN
  Administered 2021-05-22 – 2021-06-01 (×40): 15 mg via ORAL
  Filled 2021-05-22 (×44): qty 1

## 2021-05-22 NOTE — Care Management (Signed)
[facility] ?Individual Statement of Services ? ?Patient Name:  Montae Stager  ?Date:  05/22/2021 ? ?Welcome to the [facility].  Our goal is to provide you with an individualized program based on your diagnosis and situation, designed to meet your specific needs.  With this comprehensive rehabilitation program, you will be expected to participate in at least 3 hours of rehabilitation therapies Monday-Friday, with modified therapy programming on the weekends. ? ?Your rehabilitation program will include the following services:  Physical Therapy (PT), Occupational Therapy (OT), 24 hour per day rehabilitation nursing, Therapeutic Recreaction (TR), Psychology, Neuropsychology, Care Coordinator, Rehabilitation Medicine, Nutrition Services, Pharmacy Services, and Other ? ?Weekly team conferences will be held on Tuesdays to discuss your progress.  Your Inpatient Rehabilitation Care Coordinator will talk with you frequently to get your input and to update you on team discussions.  Team conferences with you and your family in attendance may also be held. ? ?Expected length of stay: 10-14 days   ? ?Overall anticipated outcome: Supervision  ? ?Depending on your progress and recovery, your program may change. Your Inpatient Rehabilitation Care Coordinator will coordinate services and will keep you informed of any changes. Your Inpatient Rehabilitation Care Coordinator's name and contact numbers are listed  below. ? ?The following services may also be recommended but are not provided by the Inpatient Rehabilitation Center:  ?Driving Evaluations ?Home Health Rehabiltiation Services ?Outpatient Rehabilitation Services ?Vocational Rehabilitation ?  ?Arrangements will be made to provide these services after discharge if needed.  Arrangements include referral to agencies that provide these services. ? ?Your insurance has been verified to be:  [employer] ? ?Your primary doctor is:   [facility] ? ?Pertinent information will be shared with your doctor and your insurance company. ? ?Inpatient Rehabilitation Care Coordinator:  Susie Cassette [phone] or (C) [phone] ? ?Information discussed with and copy given to patient by: Gretchen Short, 05/22/2021, 9:08 AM    ?

## 2021-05-22 NOTE — Progress Notes (Signed)
Pt will self admin CPAP. RT will cont to monitor.  ?

## 2021-05-22 NOTE — Progress Notes (Signed)
PROGRESS NOTE   Subjective/Complaints:  Pt reports he's getting depressed- hard to put on happy face due to pain.  Admits not taking Norco  as often as can because it makes him foggy/fuzzy headed and drugged- added that IV morphine he received didn't- will try MSIR for pain prn since won't use IV pain meds in CIR.   Askin gif can remove IV.   ROS:  Pt denies SOB, abd pain, CP, N/V/C/D, and vision changes    Objective:   VAS [place] LOWER EXTREMITY VENOUS (DVT)  Result Date: 05/21/2021  Lower Venous DVT Study Patient Name:  CHEVY UBALDO  Date of Exam:   05/21/2021 Medical Rec #: [id number]    Accession #:    [id number] Date of Birth: Mar 06, 1946    Patient Gender: M Patient Age:   76 years Exam Location:  [facility] Procedure:      VAS [place] LOWER EXTREMITY VENOUS (DVT) Referring Phys: Lauraine Rinne --------------------------------------------------------------------------------  Indications: Swelling, s/p right hip IM nail.  Limitations: Limited mobility and tissue swelling of right leg s/p surgery. Comparison Study: No prior studies. Performing Technologist: Darlin Coco RDMS, RVT  Examination Guidelines: A complete evaluation includes B-mode imaging, spectral Doppler, color Doppler, and power Doppler as needed of all accessible portions of each vessel. Bilateral testing is considered an integral part of a complete examination. Limited examinations for reoccurring indications may be performed as noted. The reflux portion of the exam is performed with the patient in reverse Trendelenburg.  +---------+---------------+---------+-----------+----------+-------------------+  RIGHT     Compressibility Phasicity Spontaneity Properties Thrombus Aging       +---------+---------------+---------+-----------+----------+-------------------+  CFV       Full            Yes       Yes                                          +---------+---------------+---------+-----------+----------+-------------------+  SFJ       Full                                                                  +---------+---------------+---------+-----------+----------+-------------------+  FV Prox   Full                                                                  +---------+---------------+---------+-----------+----------+-------------------+  FV Mid    Full                                                                  +---------+---------------+---------+-----------+----------+-------------------+  FV Distal Full                                                                  +---------+---------------+---------+-----------+----------+-------------------+  PFV       Full                                                                  +---------+---------------+---------+-----------+----------+-------------------+  POP                       Yes       Yes                    Patent by color,                                                                 limited mobility                                                                 s/p surgery          +---------+---------------+---------+-----------+----------+-------------------+  PTV       Full                                             Some segments not                                                                well visualized                                                                  secondary to                                                                     overlying tissue  properties           +---------+---------------+---------+-----------+----------+-------------------+  PERO      Full                                             Some segments not                                                                well visualized                                                                  secondary to                                                                      overlying tissue                                                                 properties           +---------+---------------+---------+-----------+----------+-------------------+   +---------+---------------+---------+-----------+----------+--------------+  LEFT      Compressibility Phasicity Spontaneity Properties Thrombus Aging  +---------+---------------+---------+-----------+----------+--------------+  CFV       Full            Yes       Yes                                    +---------+---------------+---------+-----------+----------+--------------+  SFJ       Full                                                             +---------+---------------+---------+-----------+----------+--------------+  FV Prox   Full                                                             +---------+---------------+---------+-----------+----------+--------------+  FV Mid    Full                                                             +---------+---------------+---------+-----------+----------+--------------+  FV Distal Full                                                             +---------+---------------+---------+-----------+----------+--------------+  PFV       Full                                                             +---------+---------------+---------+-----------+----------+--------------+  POP       Full            Yes       Yes                                    +---------+---------------+---------+-----------+----------+--------------+  PTV       Full                                                             +---------+---------------+---------+-----------+----------+--------------+  PERO      Full                                                             +---------+---------------+---------+-----------+----------+--------------+  Gastroc   Full                                                              +---------+---------------+---------+-----------+----------+--------------+     Summary: RIGHT: - There is no evidence of deep vein thrombosis in the lower extremity. However, portions of this examination were limited- see technologist comments above.  - No cystic structure found in the popliteal fossa.  LEFT: - There is no evidence of deep vein thrombosis in the lower extremity.  - No cystic structure found in the popliteal fossa.  *See table(s) above for measurements and observations. Electronically signed by Monica Martinez MD on 05/21/2021 at 5:14:50 PM.    Final    Recent Labs    05/20/21 0222 05/21/21 0540  WBC 3.9* 3.3*  HGB 8.2* 8.1*  HCT 26.3* 25.6*  PLT 149* 162   Recent Labs    05/20/21 0222 05/21/21 0540  NA 136 135  K 3.7 3.8  CL 96* 95*  CO2 34* 33*  GLUCOSE 101* 94  BUN 23 26*  CREATININE 0.65 0.66  CALCIUM 8.5* 8.3*    Intake/Output Summary (Last 24 hours) at 05/22/2021 0826 Last data filed at 05/21/2021 2337 Gross per 24 hour  Intake 960 ml  Output 1750 ml  Net -790 ml  Physical Exam: Vital Signs Blood pressure 92/63, pulse 79, temperature 98.2 F (36.8 C), resp. rate 15, height 5\' 11"  (1.803 m), SpO2 94 %.   General: awake, alert, appropriate, sitting up in bed; flatter affect; eating breakfast; NAD HENT: conjugate gaze; oropharynx moist CV: regular rate; no JVD Pulmonary: CTA B/L; no W/R/R- good air movement GI: soft, NT, ND, (+)BS Psychiatric: appropriate- depressed affect but talking about it Neurological: Ox3 Musculoskeletal:     Cervical back: Neck supple. No tenderness.     Comments: UE strength 5/5 B/L LLE- 5/5 in HF, KE, DF and PF RLE- 2/5 in proximal RLE; 5/5 distally TTP over R lateral hip and groin as well as R acromium - no change Skin:    Comments: Right hip incision is dressed. Moderate swelling; no erythema; original surgical dressing in place Large purple bruise inner upper R arm 1-2+ LE edema to distal calves B/L Venous  stasis changes distal 1/3 of calves B/L R forearm IV - looks OK - looks OK Neurological:     Mental Status: He is alert and oriented to person, place, and time.     Comments: Patient is alert.  Makes eye contact with examiner.  Provides name and age.  Limited medical historian.  Follows commands. Decreased sensation to light touch in lower legs bowel knees B/L  Otherwise, intact above knees and in arms   Assessment/Plan: 1. Functional deficits which require 3+ hours per day of interdisciplinary therapy in a comprehensive inpatient rehab setting. Physiatrist is providing close team supervision and 24 hour management of active medical problems listed below. Physiatrist and rehab team continue to assess barriers to discharge/monitor patient progress toward functional and medical goals  Care Tool:  Bathing  Bathing activity did not occur: Refused           Bathing assist       Upper Body Dressing/Undressing Upper body dressing   What is the patient wearing?: Pull over shirt    Upper body assist Assist Level: Minimal Assistance - Patient > 75%    Lower Body Dressing/Undressing Lower body dressing      What is the patient wearing?: Underwear/pull up, Pants     Lower body assist Assist for lower body dressing: Maximal Assistance - Patient 25 - 49%     Toileting Toileting Toileting Activity did not occur Landscape architect and hygiene only): Refused  Toileting assist Assist for toileting: Minimal Assistance - Patient > 75%     Transfers Chair/bed transfer  Transfers assist     Chair/bed transfer assist level: Minimal Assistance - Patient > 75%     Locomotion Ambulation   Ambulation assist      Assist level: Minimal Assistance - Patient > 75% Assistive device: Walker-rolling     Walk 10 feet activity   Assist     Assist level: Minimal Assistance - Patient > 75% Assistive device: Walker-rolling   Walk 50 feet activity   Assist Walk 50 feet with  2 turns activity did not occur: Safety/medical concerns         Walk 150 feet activity   Assist Walk 150 feet activity did not occur: Safety/medical concerns         Walk 10 feet on uneven surface  activity   Assist Walk 10 feet on uneven surfaces activity did not occur: Safety/medical concerns         Wheelchair     Assist Is the patient using a wheelchair?: Yes Type of Wheelchair: Manual  Wheelchair assist level: Supervision/Verbal cueing Max wheelchair distance: 150'    Wheelchair 50 feet with 2 turns activity    Assist        Assist Level: Supervision/Verbal cueing   Wheelchair 150 feet activity     Assist      Assist Level: Supervision/Verbal cueing   Blood pressure 92/63, pulse 79, temperature 98.2 F (36.8 C), resp. rate 15, height 5\' 11"  (1.803 m), SpO2 94 %.  Medical Problem List and Plan: 1. Functional deficits secondary to acute minimally displaced fracture of right acromion with conservative care/weightbearing as tolerated.  Acute comminuted angulated intertrochanteric fracture of right hip with moderate surrounding intramuscular interstitial hemorrhage.  Status post open treatment with intramedullary implant and interlocking screws 05/15/2021.  Weightbearing as tolerated.             -patient may  shower if dressing covered             -ELOS/Goals: 10-14 days supervision  Con't CIR- PT and OT- will determine length of stay next Tuesday 2.  Antithrombotics: -DVT/anticoagulation:  Pharmaceutical: Lovenox x6 weeks.  Check vascular study             -antiplatelet therapy: Aspirin 81 mg daily and Plavix 75 mg daily 3. Pain Management/chronic pain/peripheral polyneuropathy: Maintained on Suboxone 2-0.5 mg 1 tablet daily as prior to admission followed by Dr.IHEAGWARA at the [facility] [phone] extension [phone], Voltaren gel as needed, Robaxin as needed, hydrocodone as needed  3/9- not taking Norco  much- makes him confused/drugged feeling- will change to [facility] for now 15 mg- since had good response to IV morphine- won't use IV. Will monitoring behavior closely, since on Suboxone and MSIR 4. Mood/PTSD with dementia: Cymbalta 60 mg daily and 30 mg nightly, Namenda 10 mg daily and 20 mg nightly,Razadyne 16 mg nightly  3/9- doesn't want to change depression meds- just needs to talk about where he's at- emotional support             -antipsychotic agents: N/A 5. Neuropsych: This patient is capable of making decisions on his own behalf. 6. Skin/Wound Care: Routine skin checks.  Keep Aquacel on and dry for up to 14 days. 7. Fluids/Electrolytes/Nutrition: Routine in and outs with follow-up chemistries 8.  Hypertension.  Monitor with increased mobility 9.  OSA.  CPAP 10.  Acute blood loss anemia.  S/p 2 units pRBCs. Follow-up CBC.  Continue iron supplement 11.  Atrial fibrillation/pacemaker/watchman.  Cardiac rate controlled. 12.  Pancreatic insufficiency.  Pancrelipase 13. GERD_ just restarted Porotonix- having a lot of GERD Sx's- con't tums prn  3/8- reflux better this AM- con't regimen 14. Azotemia  3/9- will recheck Monday- BUN up to 26 and Cr ok- likely dry- will push fluids 145. Dispo  3/9- will d/c IV    I spent a total of  37  minutes on total care today- >50% coordination of care- due to prolonged d/w pt about pain meds and depression- grief support   LOS: 2 days A FACE TO FACE EVALUATION WAS PERFORMED  Tiaja Hagan 05/22/2021, 8:26 AM

## 2021-05-22 NOTE — Progress Notes (Signed)
Patient ID: Jack Neal, male   DOB: June 07, 1945, 76 y.o.   MRN: [id number] ? ?SW left message CM- Cassie Hansen to inform on ELOS 10-14 days and will email H&P. ? ?[id number] spoke with patient wife [name] to introduce self, explain role, discussed discharge process, and  inform on ELOS. She reports that while patient will return home, she is not physically able to care for him due to her own health problems. SW informed will explore [facility] service connection and see possible benefits available. SW informed will follow-up after team conference.  ? ?Cecile Sheerer, MSW, LCSWA ?Office: [phone] ?Cell: [phone] ?Fax: [phone]  ? ? ?

## 2021-05-22 NOTE — Progress Notes (Signed)
Physical Therapy Session Note ? ?Patient Details  ?Name: Jack Neal ?MRN: [id number] ?Date of Birth: [birth year] ? ?Today's Date: 05/22/2021 ?PT Individual Time: 3202-3343 ?PT Individual Time Calculation (min): 57 min  ? ?Short Term Goals: ?Week 1:  PT Short Term Goal 1 (Week 1): Pt will ambulate x 50 ft with LRAD and min A ?PT Short Term Goal 2 (Week 1): Pt will perform bed mobility with Supervision ?PT Short Term Goal 3 (Week 1): Pt will performing objective functional outcome measure ? ?Skilled Therapeutic Interventions/Progress Updates: Pt presented in w/c agreeable to therapy. Pt c/o pain 7/10, nsg notified and pain meds provided during session as well as rest breaks and emotional support provided as needed. Pt propelled to elevaotrs with supervision using BUE, PTA assisted pt into elevator and pt was able to propel out of elevator to 4th fl rehab gym. Pt then participated in ambulation with RW with CGA 43f. Pt ambulated with step to pattern, antalgic gait, and noted decreased wt bearing to RLE. Pt then participated in toe taps initially to 4in step however noted compensation when attempting to shift weight to R therefore changed to 2in step with improved mechanics. Pt then transferred to parallel where pt participated in side stepping [id number] 4. Pt also participated in standing SLR, hip abd/add, hip flexion, and hamstring curls x 10 ea on RLE. Pt then indicated need for urinary void. Pt propelled back to elevators and propelled back to room in same manner as prior. In room pt requesting to void in standing. Pt stood at sink with CGA and was able to perform LB clothing management and toileting task in standing intermittently using counter for support with overall CGA. Pt returned to w/c and left with belt alarm on, call bell within reach and needs met.  ?   ? ?Therapy Documentation ?Precautions:  ?Precautions ?Precautions: Fall ?Restrictions ?Weight Bearing Restrictions: Yes ?RUE Weight Bearing: Weight bearing as  tolerated ?RLE Weight Bearing: Weight bearing as tolerated ?Other Position/Activity Restrictions: RUE shoulder </ 90' flexion and abduction ?General: ?  ?Vital Signs: ?Therapy Vitals ?Temp: 98.3 ?F (36.8 ?C) ?Pulse Rate: 85 ?Resp: 18 ?BP: 110/71 ?Patient Position (if appropriate): Sitting ?Oxygen Therapy ?SpO2: 100 % ?O2 Device: Room Air ?Pain: ?Pain Assessment ?Pain Scale: 0-10 ?Pain Score: 4  ?Pain Type: Surgical pain ?Pain Location: Hip ?Pain Orientation: Right ?Pain Descriptors / Indicators: Constant ?Pain Frequency: Intermittent ?Pain Onset: On-going ?Pain Intervention(s): Medication (See eMAR) ?Mobility: ?  ?Locomotion : ?   ?Trunk/Postural Assessment : ?   ?Balance: ?  ?Exercises: ?  ?Other Treatments:   ? ? ? ?Therapy/Group: Individual Therapy ? ?Ysela Hettinger ?05/22/2021, 4:18 PM  ?

## 2021-05-22 NOTE — Progress Notes (Signed)
Occupational Therapy Session Note ? ?Patient Details  ?Name: Jack Neal ?MRN: [id number] ?Date of Birth: 1945/03/19 ? ?Today's Date: 05/22/2021 ?OT Individual Time: 1000-1030 ?OT Individual Time Calculation (min): 30 min  ? ? ?Short Term Goals: ?Week 1:  OT Short Term Goal 1 (Week 1): Patient will complete LB bathing with Min A and use of AE/DME. ?OT Short Term Goal 2 (Week 1): Patient will don UB clothing with set-up assist. ?OT Short Term Goal 3 (Week 1): Patient will complete 3/3 parts of toileting task with Min A and LRAD. ?OT Short Term Goal 4 (Week 1): Patient will complete toilet transfer with Min A and LRAD. ? ?Skilled Therapeutic Interventions/Progress Updates:  ?   ?Pt received in recliner with some "pain" reported in hip, however premedicated. Pt completes ambualtory transfers to w/c with CGA and VC for hand placement for power up. Pt reporting very upset that missed bible study last night. OT added reminder into phone for 5 min before zoom call and made weekly occurrence to assist with recalling to get onto zoom link. Pt appreciative. Pt taken to outside courtyard and discussed mood, DC planning, worries and things excited for. Pt only abl eot state worries. OT educated pt to look for the opportunities inside his worries (ex: he is worried about leaving the hospital and losing his tx team, opportunity in having new tx team/meeting new people and continuing to progress) ? ?Pt left at end of session in w/c with exit alarm on, call light in reach and all needs met ? ? ?Therapy Documentation ?Precautions:  ?Precautions ?Precautions: Fall ?Restrictions ?Weight Bearing Restrictions: Yes ?RUE Weight Bearing: Weight bearing as tolerated ?RLE Weight Bearing: Weight bearing as tolerated ?Other Position/Activity Restrictions: RUE shoulder </ 90' flexion and abduction ? ? ?Therapy/Group: Individual Therapy ? ?Lowella Dell Kimmy Totten ?05/22/2021, 6:49 AM ?

## 2021-05-22 NOTE — Progress Notes (Signed)
Occupational Therapy Session Note ? ?Patient Details  ?Name: Jack Neal ?MRN: [id number] ?Date of Birth: 1945-11-28 ? ?Today's Date: 05/22/2021 ?OT Individual Time: 1856-3149 ?OT Individual Time Calculation (min): 70 min  ? ? ?Short Term Goals: ?Week 1:  OT Short Term Goal 1 (Week 1): Patient will complete LB bathing with Min A and use of AE/DME. ?OT Short Term Goal 2 (Week 1): Patient will don UB clothing with set-up assist. ?OT Short Term Goal 3 (Week 1): Patient will complete 3/3 parts of toileting task with Min A and LRAD. ?OT Short Term Goal 4 (Week 1): Patient will complete toilet transfer with Min A and LRAD. ? ?Skilled Therapeutic Interventions/Progress Updates:  ?  Pt resting in bed upon arrival with RN present. OT intervention with focus on bed mobility, sit<>stand, standing balance, functional amb with RW, LB dressing, discharge planning, and activity tolerance to increase independence with BADLs. Supine>sit EOB with supervision. Pt sat EOB to complete LB dressing tasks. Sit<>stand from EOB with supervision using RW. Pt able to thread BLE into pants and pull pants over hips with supervision. Pt required assistance donning compression hose and shoe on RLE. Pt amb with RW (CGA) to sink and completed grooming tasks (brushing teeth and washing hair) while standing at sink with CGA-approx 7 mins. Pt returned to recliner and remained seated. All needs within reach and belt alarm activated. ? ?Therapy Documentation ?Precautions:  ?Precautions ?Precautions: Fall ?Restrictions ?Weight Bearing Restrictions: Yes ?RUE Weight Bearing: Weight bearing as tolerated ?RLE Weight Bearing: Weight bearing as tolerated ?Other Position/Activity Restrictions: RUE shoulder </ 90' flexion and abduction ?Pain: ?Pt c/o 4/10 Rt hip pain at rest with increase to 7/10 with activity; ice applied and repositioned ? ? ?Therapy/Group: Individual Therapy ? ?[name] ?05/22/2021, 9:34 AM ?

## 2021-05-23 MED ORDER — LIDOCAINE 5 % EX PTCH
2.0000 | MEDICATED_PATCH | CUTANEOUS | Status: DC
  Administered 2021-05-23 – 2021-06-03 (×12): 2 via TRANSDERMAL
  Filled 2021-05-23 (×12): qty 2

## 2021-05-23 NOTE — Progress Notes (Signed)
Physical Therapy Session Note ? ?Patient Details  ?Name: Jack Neal ?MRN: [id number] ?Date of Birth: 03/14/1946 ? ?Today's Date: 05/23/2021 ?PT Individual Time: [id number] and 0973- 5329 ?PT Individual Time Calculation (min): 59 min and 45 min ? ?Short Term Goals: ?Week 1:  PT Short Term Goal 1 (Week 1): Pt will ambulate x 50 ft with LRAD and min A ?PT Short Term Goal 2 (Week 1): Pt will perform bed mobility with Supervision ?PT Short Term Goal 3 (Week 1): Pt will performing objective functional outcome measure ? ?Skilled Therapeutic Interventions/Progress Updates: Pt presented in recliner with NT present agreeable to therapy. Pt sates did not have good night and received pain meds this am with little affect. Pt currently states 7/10 in R hip. PTA provided rest breaks, ice, and emotional support during session. PTA also provided edu on therapy progress indicating at times with increased workload may have more pain next day. Pt agreeable to participate in seated therex WLP. Pt participated in the following as below. PTA provided frequent rest breaks during activity. PTA provided music during activity which lightened pt's mood a bit. After therex pt performed Sit to stand x 2 from recliner with CGA. Nsg arrived at end of session to provide am meds. Pt left in recliner at end of session with seat alarm on, call bell within reach and needs met.  ?All therex performed 2 x 10 ?Ankle pumps ?QS/GS ?AA heel slides ?AA hip abd/add ?LAQ ? ?Tx2: Pt presented in recliner agreeable to therapy. Pt states pain more manageable than this am, 5/10 with increase to 6/10 with activity. Pt requesting to try to void prior to leaving room. Performed Sit to stand with CGA and pt was able to manage clothing to use urinal in standing (- urinary void). Pt then ambulated ~40 ft with RW and CGA to nsg station. Pt noted to have decreased knee flexion and poor heel strike. PTA noted that RW was tall for pt during ambulation, pt was able to pause  while in standing to allow PTA to adjust with PTA noting improved cadence after adjustment and pt stating felt better to ambulate. Pt then propelled to elevators, navigated getting on/off elevators and propelled to day room with supervision and without break. Pt then participated in Cybex Kinetron 80cm/sec then 70cm/sec for 3 cycles of 10 for ROM and strengthening. Pt then ambulated 48ft with CGA and PTA providing verbal cues for increased heel strike and knee flexion of RLE with pt able to intermittently improve technique however was inconsistent. Participated in step overs shuffle board stick x 10 for weight shifting and encouragement of knee flexion. Pt transported back to 5th floor then participated an additional ambulation bout of 31ft back to room. Pt requesting to use bathroom for BM. Pt ambulated to bathroom in room and was CGA for clothing management and CGA for toilet transfer. Pt left at toilet at end of session with call bell within reach and nsg notified of pt's disposition.  ?   ? ?Therapy Documentation ?Precautions:  ?Precautions ?Precautions: Fall ?Restrictions ?Weight Bearing Restrictions: Yes ?RUE Weight Bearing: Weight bearing as tolerated ?RLE Weight Bearing: Weight bearing as tolerated ?Other Position/Activity Restrictions: RUE shoulder </ 90' flexion and abduction ?General: ?  ?Vital Signs: ?Therapy Vitals ?Temp: 97.8 ?F (36.6 ?C) ?Pulse Rate: 83 ?Resp: 16 ?BP: (!) 93/59 ?Patient Position (if appropriate): Lying ?Oxygen Therapy ?SpO2: 95 % ?O2 Device: Room Air ?Pain: ?Pain Assessment ?Pain Score: 4  ?Mobility: ?  ?Locomotion : ?   ?  Trunk/Postural Assessment : ?   ?Balance: ?  ?Exercises: ?  ?Other Treatments:   ? ? ? ?Therapy/Group: Individual Therapy ? ?Quamere Mussell ?05/23/2021, 4:13 PM  ?

## 2021-05-23 NOTE — Progress Notes (Signed)
Occupational Therapy Session Note ? ?Patient Details  ?Name: Jack Neal ?MRN: [id number] ?Date of Birth: 05/16/1945 ? ?Today's Date: 05/23/2021 ?OT Individual Time: [id number] ?OT Individual Time Calculation (min): 85 min  ? ? ?Short Term Goals: ?Week 1:  OT Short Term Goal 1 (Week 1): Patient will complete LB bathing with Min A and use of AE/DME. ?OT Short Term Goal 2 (Week 1): Patient will don UB clothing with set-up assist. ?OT Short Term Goal 3 (Week 1): Patient will complete 3/3 parts of toileting task with Min A and LRAD. ?OT Short Term Goal 4 (Week 1): Patient will complete toilet transfer with Min A and LRAD. ? ?Skilled Therapeutic Interventions/Progress Updates:  ?  Pt resting in recliner upon arrival. Pt declines shower this morning 2/2 placement of Lidocaine patch on Rt hip. Pt requested to bath at sink. Pt amb with RW to sink with min A and stood at sink for ~25 mins to complete bathing tasks, including washing hair. Pt sat back into w/c to bathe Bil feet. Dressing with min A for LB tasks. Pt requires extra time to complete tasks but is very thorough and no unsafe behaviors noted. Sit>stand with supervision. STanding balance with CGA. Pt amb back to recliner and remained in recliner with all needs within reach and belt alarm activated. Ice applied to Rt hip.   ? ?Therapy Documentation ?Precautions:  ?Precautions ?Precautions: Fall ?Restrictions ?Weight Bearing Restrictions: Yes ?RUE Weight Bearing: Weight bearing as tolerated ?RLE Weight Bearing: Weight bearing as tolerated ?Other Position/Activity Restrictions: RUE shoulder </ 90' flexion and abduction ?  ?Pain: ?Pt reports 6/10 Rt hip pain this morning; repositioned and ice applied ? ? ?Therapy/Group: Individual Therapy ? ?[name] ?05/23/2021, 12:19 PM ?

## 2021-05-23 NOTE — Progress Notes (Signed)
PROGRESS NOTE   Subjective/Complaints:  Pt reports has been in a lot of pain- got MSIR at 5:30am-  Still hadn't worked 1 hour later- got suboxone- pain down from 7-8/10 down to 5/10 with suboxone.   Has used lidocaine patches in past- wants to use.    ROS:  Pt denies SOB, abd pain, CP, N/V/C/D, and vision changes     Objective:   VAS [place] LOWER EXTREMITY VENOUS (DVT)  Result Date: 05/21/2021  Lower Venous DVT Study Patient Name:  Jack Neal  Date of Exam:   05/21/2021 Medical Rec #: [id number]    Accession #:    [id number] Date of Birth: 02/14/1946    Patient Gender: M Patient Age:   76 years Exam Location:  [facility] Procedure:      VAS [place] LOWER EXTREMITY VENOUS (DVT) Referring Phys: Lauraine Rinne --------------------------------------------------------------------------------  Indications: Swelling, s/p right hip IM nail.  Limitations: Limited mobility and tissue swelling of right leg s/p surgery. Comparison Study: No prior studies. Performing Technologist: Darlin Coco RDMS, RVT  Examination Guidelines: A complete evaluation includes B-mode imaging, spectral Doppler, color Doppler, and power Doppler as needed of all accessible portions of each vessel. Bilateral testing is considered an integral part of a complete examination. Limited examinations for reoccurring indications may be performed as noted. The reflux portion of the exam is performed with the patient in reverse Trendelenburg.  +---------+---------------+---------+-----------+----------+-------------------+  RIGHT     Compressibility Phasicity Spontaneity Properties Thrombus Aging       +---------+---------------+---------+-----------+----------+-------------------+  CFV       Full            Yes       Yes                                         +---------+---------------+---------+-----------+----------+-------------------+  SFJ       Full                                                                   +---------+---------------+---------+-----------+----------+-------------------+  FV Prox   Full                                                                  +---------+---------------+---------+-----------+----------+-------------------+  FV Mid    Full                                                                  +---------+---------------+---------+-----------+----------+-------------------+  FV Distal Full                                                                  +---------+---------------+---------+-----------+----------+-------------------+  PFV       Full                                                                  +---------+---------------+---------+-----------+----------+-------------------+  POP                       Yes       Yes                    Patent by color,                                                                 limited mobility                                                                 s/p surgery          +---------+---------------+---------+-----------+----------+-------------------+  PTV       Full                                             Some segments not                                                                well visualized                                                                  secondary to                                                                     overlying tissue  properties           +---------+---------------+---------+-----------+----------+-------------------+  PERO      Full                                             Some segments not                                                                well visualized                                                                  secondary to                                                                     overlying tissue                                                                  properties           +---------+---------------+---------+-----------+----------+-------------------+   +---------+---------------+---------+-----------+----------+--------------+  LEFT      Compressibility Phasicity Spontaneity Properties Thrombus Aging  +---------+---------------+---------+-----------+----------+--------------+  CFV       Full            Yes       Yes                                    +---------+---------------+---------+-----------+----------+--------------+  SFJ       Full                                                             +---------+---------------+---------+-----------+----------+--------------+  FV Prox   Full                                                             +---------+---------------+---------+-----------+----------+--------------+  FV Mid    Full                                                             +---------+---------------+---------+-----------+----------+--------------+  FV Distal Full                                                             +---------+---------------+---------+-----------+----------+--------------+  PFV       Full                                                             +---------+---------------+---------+-----------+----------+--------------+  POP       Full            Yes       Yes                                    +---------+---------------+---------+-----------+----------+--------------+  PTV       Full                                                             +---------+---------------+---------+-----------+----------+--------------+  PERO      Full                                                             +---------+---------------+---------+-----------+----------+--------------+  Gastroc   Full                                                             +---------+---------------+---------+-----------+----------+--------------+     Summary: RIGHT: - There is no evidence of deep vein thrombosis in the lower extremity.  However, portions of this examination were limited- see technologist comments above.  - No cystic structure found in the popliteal fossa.  LEFT: - There is no evidence of deep vein thrombosis in the lower extremity.  - No cystic structure found in the popliteal fossa.  *See table(s) above for measurements and observations. Electronically signed by Monica Martinez MD on 05/21/2021 at 5:14:50 PM.    Final    Recent Labs    05/21/21 0540  WBC 3.3*  HGB 8.1*  HCT 25.6*  PLT 162   Recent Labs    05/21/21 0540  NA 135  K 3.8  CL 95*  CO2 33*  GLUCOSE 94  BUN 26*  CREATININE 0.66  CALCIUM 8.3*    Intake/Output Summary (Last 24 hours) at 05/23/2021 0840 Last data filed at 05/23/2021 0700 Gross per 24 hour  Intake 1440 ml  Output 1800 ml  Net -360 ml        Physical Exam: Vital Signs Blood pressure 102/62, pulse 80, temperature 97.9 F (36.6  C), resp. rate 15, height 5\' 11"  (1.803 m), SpO2 93 %.    General: awake, alert, appropriate, sitting u in bedside chair; NAD HENT: conjugate gaze; oropharynx moist CV: regular rate; no JVD Pulmonary: CTA B/L; no W/R/R- good air movement GI: soft, NT, ND, (+)BS Psychiatric: appropriate- depressed- but better today Neurological: Ox3  Musculoskeletal:Swelling on R hip- original dressing    Cervical back: Neck supple. No tenderness.     Comments: UE strength 5/5 B/L LLE- 5/5 in HF, KE, DF and PF RLE- 2/5 in proximal RLE; 5/5 distally TTP over R lateral hip and groin as well as R acromium - no change Skin:    Comments: Right hip incision is dressed. Moderate swelling; no erythema; original surgical dressing in place Large purple bruise inner upper R arm 1-2+ LE edema to distal calves B/L Venous stasis changes distal 1/3 of calves B/L R forearm IV - looks OK - looks OK Neurological:     Mental Status: He is alert and oriented to person, place, and time.     Comments: Patient is alert.  Makes eye contact with examiner.  Provides name  and age.  Limited medical historian.  Follows commands. Decreased sensation to light touch in lower legs bowel knees B/L  Otherwise, intact above knees and in arms   Assessment/Plan: 1. Functional deficits which require 3+ hours per day of interdisciplinary therapy in a comprehensive inpatient rehab setting. Physiatrist is providing close team supervision and 24 hour management of active medical problems listed below. Physiatrist and rehab team continue to assess barriers to discharge/monitor patient progress toward functional and medical goals  Care Tool:  Bathing  Bathing activity did not occur: Refused           Bathing assist       Upper Body Dressing/Undressing Upper body dressing Upper body dressing/undressing activity did not occur (including orthotics): Refused What is the patient wearing?: Pull over shirt    Upper body assist Assist Level: Minimal Assistance - Patient > 75%    Lower Body Dressing/Undressing Lower body dressing      What is the patient wearing?: Pants     Lower body assist Assist for lower body dressing: Contact Guard/Touching assist     Toileting Toileting Toileting Activity did not occur (Clothing management and hygiene only): Refused  Toileting assist Assist for toileting: Minimal Assistance - Patient > 75%     Transfers Chair/bed transfer  Transfers assist     Chair/bed transfer assist level: Minimal Assistance - Patient > 75%     Locomotion Ambulation   Ambulation assist      Assist level: Minimal Assistance - Patient > 75% Assistive device: Walker-rolling     Walk 10 feet activity   Assist     Assist level: Minimal Assistance - Patient > 75% Assistive device: Walker-rolling   Walk 50 feet activity   Assist Walk 50 feet with 2 turns activity did not occur: Safety/medical concerns         Walk 150 feet activity   Assist Walk 150 feet activity did not occur: Safety/medical concerns         Walk 10  feet on uneven surface  activity   Assist Walk 10 feet on uneven surfaces activity did not occur: Safety/medical concerns         Wheelchair     Assist Is the patient using a wheelchair?: Yes Type of Wheelchair: Manual    Wheelchair assist level: Supervision/Verbal cueing Max wheelchair distance: 150'  Wheelchair 50 feet with 2 turns activity    Assist        Assist Level: Supervision/Verbal cueing   Wheelchair 150 feet activity     Assist      Assist Level: Supervision/Verbal cueing   Blood pressure 102/62, pulse 80, temperature 97.9 F (36.6 C), resp. rate 15, height 5\' 11"  (1.803 m), SpO2 93 %.  Medical Problem List and Plan: 1. Functional deficits secondary to acute minimally displaced fracture of right acromion with conservative care/weightbearing as tolerated.  Acute comminuted angulated intertrochanteric fracture of right hip with moderate surrounding intramuscular interstitial hemorrhage.  Status post open treatment with intramedullary implant and interlocking screws 05/15/2021.  Weightbearing as tolerated.             -patient may  shower if dressing covered             -ELOS/Goals: 10-14 days supervision  Con't CIR- PT and OT 2.  Antithrombotics: -DVT/anticoagulation:  Pharmaceutical: Lovenox x6 weeks.  Check vascular study             -antiplatelet therapy: Aspirin 81 mg daily and Plavix 75 mg daily 3. Pain Management/chronic pain/peripheral polyneuropathy: Maintained on Suboxone 2-0.5 mg 1 tablet daily as prior to admission followed by Dr.IHEAGWARA at the [facility] [phone] extension [phone], Voltaren gel as needed, Robaxin as needed, hydrocodone as needed  3/9- not taking Norco much- makes him confused/drugged feeling- will change to [facility] for now 15 mg- since had good response to IV morphine- won't use IV. Will monitoring behavior closely, since on Suboxone and MSIR  3/10- pain worse this AM- will add  lidocaine patches 8am to 8pm- likely due to weather, making it worse 4. Mood/PTSD with dementia: Cymbalta 60 mg daily and 30 mg nightly, Namenda 10 mg daily and 20 mg nightly,Razadyne 16 mg nightly  3/9- doesn't want to change depression meds- just needs to talk about where he's at- emotional support             -antipsychotic agents: N/A 5. Neuropsych: This patient is capable of making decisions on his own behalf. 6. Skin/Wound Care: Routine skin checks.  Keep Aquacel on and dry for up to 14 days. 7. Fluids/Electrolytes/Nutrition: Routine in and outs with follow-up chemistries 8.  Hypertension.  Monitor with increased mobility 9.  OSA.  CPAP 10.  Acute blood loss anemia.  S/p 2 units pRBCs. Follow-up CBC.  Continue iron supplement 11.  Atrial fibrillation/pacemaker/watchman.  Cardiac rate controlled. 12.  Pancreatic insufficiency.  Pancrelipase 13. GERD_ just restarted Porotonix- having a lot of GERD Sx's- con't tums prn  3/8- reflux better this AM- con't regimen 14. Azotemia  3/9- will recheck Monday- BUN up to 26 and Cr ok- likely dry- will push fluids 145. Dispo  3/9- will d/c IV  3/10- will write for grounds pass for pt mood  I spent a total of  35  minutes on total care today- >50% coordination of care- due to prolonged time with pt and IPOC and d/w nursing.        LOS: 3 days A FACE TO FACE EVALUATION WAS PERFORMED  Lavon Horn 05/23/2021, 8:40 AM

## 2021-05-23 NOTE — IPOC Note (Signed)
Overall Plan of Care (IPOC) ?Patient Details ?Name: Jack Neal ?MRN: [id number] ?DOB: 1945-07-23 ? ?Admitting Diagnosis: Intertrochanteric fracture of right hip ([name]) ? ?Hospital Problems: Principal Problem: ?  Intertrochanteric fracture of right hip ([name]) ? ? ? ? Functional Problem List: ?Nursing Edema, Endurance, Motor, Pain, Skin Integrity, Safety  ?PT Balance, Endurance, Pain, Sensory  ?OT Balance, Endurance, Pain, Skin Integrity  ?SLP    ?TR    ?    ? Basic ADL?s: ?OT Grooming, Bathing, Dressing, Toileting  ? ?  Advanced  ADL?s: ?OT    ?   ?Transfers: ?PT Bed Mobility, Bed to Chair, Car, Furniture, Floor  ?OT Toilet, Tub/Shower  ? ?  Locomotion: ?PT Ambulation, Wheelchair Mobility, Stairs  ? ?  Additional Impairments: ?OT    ?SLP   ?  ?   ?TR    ? ? ?Anticipated Outcomes ?Item Anticipated Outcome  ?Self Feeding N/A  ?Swallowing ?   ?  ?Basic self-care ? Mod I to S  ?Toileting ? S ?  ?Bathroom Transfers S  ?Bowel/Bladder ? n/a  ?Transfers ? Supervision  ?Locomotion ? Supervision short distance ambulation, otherwise w/c level  ?Communication ?    ?Cognition ?    ?Pain ? < 3  ?Safety/Judgment ? supervision  ? ?Therapy Plan: ?PT Intensity: Minimum of 1-2 x/day ,45 to 90 minutes ?PT Frequency: 5 out of 7 days ?PT Duration Estimated Length of Stay: 10-14 days ?OT Intensity: Minimum of 1-2 x/day, 45 to 90 minutes ?OT Frequency: 5 out of 7 days ?OT Duration/Estimated Length of Stay: 12-14 days ?   ? ?Due to the current state of emergency, patients may not be receiving their 3-hours of Medicare-mandated therapy. ? ? Team Interventions: ?Nursing Interventions Patient/Family Education, Pain Management, Skin Care/Wound Management, Discharge Planning  ?PT interventions Ambulation/gait training, Warden/ranger, Community reintegration, Discharge planning, Disease management/prevention, DME/adaptive equipment instruction, Functional mobility training, Pain management, Patient/family education, Therapeutic  Activities, Therapeutic Exercise, UE/LE Strength taining/ROM, UE/LE Coordination activities, Wheelchair propulsion/positioning  ?OT Interventions Balance/vestibular training, Community reintegration, Discharge planning, DME/adaptive equipment instruction, Functional mobility training, Pain management, Patient/family education, Psychosocial support, Self Care/advanced ADL retraining, Skin care/wound managment, Therapeutic Activities, Therapeutic Exercise, UE/LE Strength taining/ROM  ?SLP Interventions    ?TR Interventions    ?SW/CM Interventions Discharge Planning, Psychosocial Support, Patient/Family Education  ? ?Barriers to Discharge ?MD  Medical stability, Home enviroment access/loayout, Wound care, Lack of/limited family support, Weight bearing restrictions, and Behavior  ?Nursing Decreased caregiver support, Wound Care, Weight bearing restrictions ?1 level, level entry. Spouse to provide supervision 24/7.  ?PT Decreased caregiver support ?   ?OT Decreased caregiver support ?Wife only able to provide supervision A.  ?SLP   ?   ?SW   ?   ? ?Team Discharge Planning: ?Destination: PT-Home ,OT- Home , SLP-  ?Projected Follow-up: PT-Home health PT, OT-  Home health OT, SLP-  ?Projected Equipment Needs: PT-Rolling walker with 5" wheels, Wheelchair (measurements), Wheelchair cushion (measurements), OT- To be determined, SLP-  ?Equipment Details: [id number] w/c with ELR, OT-  ?Patient/family involved in discharge planning: PT- Patient, Family member/caregiver,  OT-Patient, SLP-  ? ?MD ELOS: 12-14 days ?Medical Rehab Prognosis:  Good ?Assessment:  ?The patient has been admitted for CIR therapies with the diagnosis of R Intertrochanteric fx s/p intramedullary implant- WBAT . The team will be addressing functional mobility, strength, stamina, balance, safety, adaptive techniques and equipment, self-care, bowel and bladder mgt, patient and caregiver education, pain control due to complication of Suboxone. Goals have been set  at supervison. Anticipated discharge destination is home. ? ?Due to the current state of emergency, patients may not be receiving their 3 hours per day of Medicare-mandated therapy.  ? ? ? ?  ? ? ?See Team Conference Notes for weekly updates to the plan of care ? ?

## 2021-05-23 NOTE — Progress Notes (Addendum)
Inpatient Rehabilitation Care Coordinator ?Assessment and Plan ?Patient Details  ?Name: Jack Neal ?MRN: [id number] ?Date of Birth: 10-12-1945 ? ?Today's Date: 05/23/2021 ? ?Hospital Problems: Principal Problem: ?  Intertrochanteric fracture of right hip ([place]) ? ?Past Medical History:  ?Past Medical History:  ?Diagnosis Date  ? AF (paroxysmal atrial fibrillation) ([place]) 10/01/2020  ? Atrial fibrillation ([place])   ? Celiac disease 10/01/2020  ? Chronic pain syndrome 10/01/2020  ? Depression   ? GERD (gastroesophageal reflux disease)   ? 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 Hypotension   ? Major depressive disorder 10/01/2020  ? Neuropathy   ? OSA on CPAP 10/01/2020  ? Pacemaker   ? Peripheral polyneuropathy 10/01/2020  ? Presence of cardiac pacemaker 10/01/2020  ? 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 Watchman FLX 20  ? PTSD (post-traumatic stress disorder) 10/01/2020  ? ?Past Surgical History:  ?Past Surgical History:  ?Procedure Laterality Date  ? INTRAMEDULLARY (IM) NAIL INTERTROCHANTERIC Right 05/15/2021  ? Procedure: INTRAMEDULLARY (IM) NAIL INTERTROCHANTRIC;  Surgeon: Georgeanna Harrison, MD;  Location: [facility];  Service: Orthopedics;  Laterality: Right;  ? LEFT ATRIAL APPENDAGE OCCLUSION N/A 01/02/2021  ? Procedure: LEFT ATRIAL APPENDAGE OCCLUSION;  Surgeon: Vickie Epley, MD;  Location: [facility];  Service: Cardiovascular;  Laterality: N/A;  ? TEE WITHOUT CARDIOVERSION N/A 01/02/2021  ? Procedure: TRANSESOPHAGEAL ECHOCARDIOGRAM (TEE);  Surgeon: Vickie Epley, MD;  Location: [facility];  Service: Cardiovascular;  Laterality: N/A;  ? TEE WITHOUT CARDIOVERSION N/A 02/17/2021  ? Procedure: TRANSESOPHAGEAL ECHOCARDIOGRAM (TEE);  Surgeon: Geralynn Rile, MD;  Location: [facility];  Service: Cardiovascular;  Laterality: N/A;  ? ?Social History:  reports that he has quit smoking. His smoking use included cigarettes. He has never used smokeless tobacco. He  reports that he does not currently use alcohol. He reports that he does not use drugs. ? ?Family / Support Systems ?Marital Status: Married ?How Long?: 50- years ?Patient Roles: Spouse ?Spouse/Significant Other: Audrea Muscat (wife) ?Children: 3 adult children- Chris (50, and lives in [place], [place] with his wife and 3 children), Loma Sousa (lives in [place], [place] with husband and 3 children), and Ermalinda Barrios (lives in [place] with her husband and two sons 29 and 66). ?Other Supports: NOne reported ?Anticipated Caregiver: Wife ?Ability/Limitations of Caregiver: Wife is not physically able to assist with his care needs. ?Caregiver Availability: 24/7 ?Family Dynamics: Pt lives with his wife. ? ?Social History ?Preferred language: English ?Religion: Darrick Meigs Reformed ?Cultural Background: Pt worked as an Chief Financial Officer for 39 years. Pt currenlty attends bible study [employer], and zoom group as well. ?Education: college grad and some Master level courses ?Health Literacy - How often do you need to have someone help you when you read instructions, pamphlets, or other written material from your doctor or pharmacy?: Rarely ?Writes: Yes ?Employment Status: Retired ?Date Retired/Disabled/Unemployed: 2018 ?Legal History/Current Legal Issues: Denies ?Guardian/Conservator: N/A  ? ?Abuse/Neglect ?Abuse/Neglect Assessment Can Be Completed: Yes ?Physical Abuse: Denies ?Verbal Abuse: Denies ?Sexual Abuse: Denies ?Exploitation of patient/patient's resources: Denies ?Self-Neglect: Denies ? ?Patient response to: ?Social Isolation - How often do you feel lonely or isolated from those around you?: Never ? ?Emotional Status ?Pt's affect, behavior and adjustment status: Pt in good spirits at time of visit. Pt recognizes he needs to be able to do for himself, and is willing to stay the required amount of time if needed. ?Recent Psychosocial Issues: Pt admits that he was initally depressed when he first arrived  and  shared with physician. States that  he is better now. ?Psychiatric History: Pt admits to psych hx of depression and suicidal ideation. Psychiatrist is Dr Iheagwara and scheduled to visit once a month. His counselor/SW Rose- meets with every two weeks. Pt admits to psych hospitalization 01/1989 due to suicidal ideation. ?Substance Abuse History: Denies. Reports hx of etoh use but quit in 30s. ? ?Patient / Family Perceptions, Expectations & Goals ?Pt/Family understanding of illness & functional limitations: Pt and family have a general understanding of care needs ?Premorbid pt/family roles/activities: Independent and some assistance with IADLs ?Anticipated changes in roles/activities/participation: Assistance with ADLs/IADLs ?Pt/family expectations/goals: "get back to independent living." ? ?Community Resources ?Community Agencies: None ?Premorbid Home Care/DME Agencies: Other (Comment) ([facility] for HHPT. SW will verify) ?Transportation available at discharge: TBD ?Is the patient able to respond to transportation needs?: Yes ?In the past 12 months, has lack of transportation kept you from medical appointments or from getting medications?: No ?In the past 12 months, has lack of transportation kept you from meetings, work, or from getting things needed for daily living?: No ?Resource referrals recommended: Neuropsychology ? ?Discharge Planning ?Living Arrangements: Spouse/significant other ?Support Systems: Spouse/significant other ?Type of Residence: Private residence ?Insurance Resources: Private Insurance (specify) ([facility]) ?Financial Resources: Social Security, Other (Comment) (savings/pension) ?Financial Screen Referred: No ?Living Expenses: Own ?Money Management: Spouse ?Does the patient have any problems obtaining your medications?: No ?Home Management: Pt reports he managed his medication and some meals for himself. States his wife performs housekeeping tasks. ?Patient/Family Preliminary Plans: TBD ?Care Coordinator Barriers to Discharge: Decreased  caregiver support, Lack of/limited family support ?Care Coordinator Anticipated Follow Up Needs: HH/OP ?Expected length of stay: 10-14 days ? ?Clinical Impression ?SW met with pt in room to introduce self, explain role, and discuss discharge process. 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 HCPOA- reports has in place. SW will verify with his wife. Pt is aware SW will continue to work with his wife and [facility] to ensure he has d/c needs in place/. ? ?[facility] reports pt is active with services.  ? ? A  ?05/23/2021, 5:22 PM ? ?  ?

## 2021-05-23 NOTE — Progress Notes (Signed)
?   05/23/21 1040  ?Clinical Encounter Type  ?Visited With Patient  ?Visit Type Spiritual support;Social support;Follow-up  ?Referral From Patient ?(Patient Request Chaplain Visit)  ?Consult/Referral To Chaplain  ?Spiritual Encounters  ?Spiritual Needs Emotional;Prayer  ? ?Jack Neal was experiencing increased pain today while sitting up in chair. Jack Neal stated that he did not sleep well during the night, only getting about three hours because of the increased pain. He also stated that his mood was down because he did not see improvement during morning physical therapy session. Patient discussed the many veteran service organizations that he is affiliated with. At patient's requested we shared Prayers for emotional and physical healing. [address] [place], [place] [phone]. ?

## 2021-05-25 NOTE — Progress Notes (Signed)
?                                                       PROGRESS NOTE ? ? ?Subjective/Complaints: ? ?Pain better controlled this AM- is having pain in R shoulder which is worst overnight, and having a lot of soreness due to "working out too hard".  ?But pain/meds going better.  ?LBM this AM.  ?Appetite also better.  ? ?ROS: ? ?Pt denies SOB, abd pain, CP, N/V/C/D, and vision changes ? ? ? ?Objective: ?  ?No results found. ?No results for input(s): WBC, HGB, HCT, PLT in the last 72 hours. ? ?No results for input(s): NA, K, CL, CO2, GLUCOSE, BUN, CREATININE, CALCIUM in the last 72 hours. ? ? ?Intake/Output Summary (Last 24 hours) at 05/25/2021 1457 ?Last data filed at 05/25/2021 1041 ?Gross per 24 hour  ?Intake 720 ml  ?Output 2500 ml  ?Net -1780 ml  ?  ? ?  ? ?Physical Exam: ?Vital Signs ?Blood pressure 104/64, pulse 90, temperature 98 ?F (36.7 ?C), resp. rate 16, height 5\' 11"  (1.803 m), SpO2 100 %. ? ? ? ? ?General: awake, alert, appropriate, sitting up in bedside w/c; NAD ?HENT: conjugate gaze; oropharynx moist ?CV: regular rate; no JVD ?Pulmonary: CTA B/L; no W/R/R- good air movement ?GI: soft, NT, ND, (+)BS ?Psychiatric: appropriate- less depressed; but very talkative today ?Neurological: Ox3 ? ? ?Musculoskeletal:Swelling on R hip- original dressing- looks stable ?   Cervical back: Neck supple. No tenderness.  ?   Comments: UE strength 5/5 B/L ?LLE- 5/5 in HF, KE, DF and PF ?RLE- 2/5 in proximal RLE; 5/5 distally ?TTP over R lateral hip and groin as well as R acromium - no change ?Skin: ?   Comments: Right hip incision is dressed. ?Moderate swelling; no erythema; original surgical dressing in place ?Large purple bruise inner upper R arm ?1-2+ LE edema to distal calves B/L ?Venous stasis changes distal 1/3 of calves B/L ?R forearm IV - looks OK - looks OK ?Neurological:  ?   Mental Status: He is alert and oriented to person, place, and time.  ?   Comments: Patient is alert.  Makes eye contact with examiner.   Provides name and age.  Limited medical historian.  Follows commands. ?Decreased sensation to light touch in lower legs bowel knees B/L  ?Otherwise, intact above knees and in arms  ? ?Assessment/Plan: ?1. Functional deficits which require 3+ hours per day of interdisciplinary therapy in a comprehensive inpatient rehab setting. ?Physiatrist is providing close team supervision and 24 hour management of active medical problems listed below. ?Physiatrist and rehab team continue to assess barriers to discharge/monitor patient progress toward functional and medical goals ? ?Care Tool: ? ?Bathing ? Bathing activity did not occur: Refused ?Body parts bathed by patient: Right arm, Left arm, Chest, Abdomen, Front perineal area, Buttocks, Right upper leg, Left upper leg, Right lower leg, Left lower leg, Face  ?   ?  ?  ?Bathing assist Assist Level: Contact Guard/Touching assist ?  ?  ?Upper Body Dressing/Undressing ?Upper body dressing Upper body dressing/undressing activity did not occur (including orthotics): Refused ?What is the patient wearing?: Pull over shirt ?   ?Upper body assist Assist Level: Supervision/Verbal cueing ?   ?Lower Body Dressing/Undressing ?Lower body dressing ? ? ?   ?What is  the patient wearing?: Pants, Incontinence brief ? ?  ? ?Lower body assist Assist for lower body dressing: Minimal Assistance - Patient > 75% ?   ? ?Toileting ?Toileting Toileting Activity did not occur Press photographer and hygiene only): Refused  ?Toileting assist Assist for toileting: Minimal Assistance - Patient > 75% ?  ?  ?Transfers ?Chair/bed transfer ? ?Transfers assist ?   ? ?Chair/bed transfer assist level: Contact Guard/Touching assist ?  ?  ?Locomotion ?Ambulation ? ? ?Ambulation assist ? ?   ? ?Assist level: Contact Guard/Touching assist ?Assistive device: Walker-rolling ?Max distance: 250'  ? ?Walk 10 feet activity ? ? ?Assist ?   ? ?Assist level: Contact Guard/Touching assist ?Assistive device: Walker-rolling   ? ?Walk 50 feet activity ? ? ?Assist Walk 50 feet with 2 turns activity did not occur: Safety/medical concerns ? ?Assist level: Contact Guard/Touching assist ?Assistive device: Walker-rolling  ? ? ?Walk 150 feet activity ? ? ?Assist Walk 150 feet activity did not occur: Safety/medical concerns ? ?Assist level: Contact Guard/Touching assist ?Assistive device: Walker-rolling ?  ? ?Walk 10 feet on uneven surface  ?activity ? ? ?Assist Walk 10 feet on uneven surfaces activity did not occur: Safety/medical concerns ? ? ?  ?   ? ?Wheelchair ? ? ? ? ?Assist Is the patient using a wheelchair?: Yes ?Type of Wheelchair: Manual ?  ? ?Wheelchair assist level: Supervision/Verbal cueing ?Max wheelchair distance: 150'  ? ? ?Wheelchair 50 feet with 2 turns activity ? ? ? ?Assist ? ?  ?  ? ? ?Assist Level: Supervision/Verbal cueing  ? ?Wheelchair 150 feet activity  ? ? ? ?Assist ?   ? ? ?Assist Level: Supervision/Verbal cueing  ? ?Blood pressure 104/64, pulse 90, temperature 98 ?F (36.7 ?C), resp. rate 16, height 5\' 11"  (1.803 m), SpO2 100 %. ? ?Medical Problem List and Plan: ?1. Functional deficits secondary to acute minimally displaced fracture of right acromion with conservative care/weightbearing as tolerated.  Acute comminuted angulated intertrochanteric fracture of right hip with moderate surrounding intramuscular interstitial hemorrhage.  Status post open treatment with intramedullary implant and interlocking screws 05/15/2021.  Weightbearing as tolerated. ?            -patient may  shower if dressing covered ?            -ELOS/Goals: 10-14 days supervision ? Con't CIR- PT and OT ?2.  Antithrombotics: ?-DVT/anticoagulation:  Pharmaceutical: Lovenox x6 weeks.  Check vascular study ?            -antiplatelet therapy: Aspirin 81 mg daily and Plavix 75 mg daily ?3. Pain Management/chronic pain/peripheral polyneuropathy: Maintained on Suboxone 2-0.5 mg 1 tablet daily as prior to admission followed by Dr.IHEAGWARA at the [facility] [phone] extension [phone], Voltaren gel as needed, Robaxin as needed, hydrocodone as needed ? 3/9- not taking Norco much- makes him confused/drugged feeling- will change to [facility] for now 15 mg- since had good response to IV morphine- won't use IV. Will monitoring behavior closely, since on Suboxone and MSIR ? 3/10- pain worse this AM- will add lidocaine patches 8am to 8pm- likely due to weather, making it worse ? 3/12- pain doing better- con't regimen ?4. Mood/PTSD with dementia: Cymbalta 60 mg daily and 30 mg nightly, Namenda 10 mg daily and 20 mg nightly,Razadyne 16 mg nightly ? 3/9- doesn't want to change depression meds- just needs to talk about where he's at- emotional support ?            -  antipsychotic agents: N/A ?5. Neuropsych: This patient is capable of making decisions on his own behalf. ?6. Skin/Wound Care: Routine skin checks.  Keep Aquacel on and dry for up to 14 days. ?7. Fluids/Electrolytes/Nutrition: Routine in and outs with follow-up chemistries ?8.  Hypertension.  Monitor with increased mobility ?9.  OSA.  CPAP ?10.  Acute blood loss anemia.  S/p 2 units pRBCs. Follow-up CBC.  Continue iron supplement ?11.  Atrial fibrillation/pacemaker/watchman.  Cardiac rate controlled. ?12.  Pancreatic insufficiency.  Pancrelipase ?13. GERD_ just restarted Porotonix- having a lot of GERD Sx's- con't tums prn ? 3/8- reflux better this AM- con't regimen ?14. Azotemia ? 3/9- will recheck Monday- BUN up to 26 and Cr ok- likely dry- will push fluids ? 3/12- labs in AM ?145. Dispo  ?3/9- will d/c IV ? 3/10- will write for grounds pass for pt mood ? ? ? ?  ? ? ? ?LOS: ?5 days ?A FACE TO FACE EVALUATION WAS PERFORMED ? ?Jack Neal ?05/25/2021, 2:57 PM  ? ? ? ?

## 2021-05-25 NOTE — Progress Notes (Signed)
Occupational Therapy Session Note ? ?Patient Details  ?Name: Jack Neal ?MRN: [id number] ?Date of Birth: 05/24/1945 ? ?Today's Date: 05/25/2021 ?OT Individual Time: 5956-3875 ?OT Individual Time Calculation (min): 75 min  ? ? ?Short Term Goals: ?Week 1:  OT Short Term Goal 1 (Week 1): Patient will complete LB bathing with Min A and use of AE/DME. ?OT Short Term Goal 2 (Week 1): Patient will don UB clothing with set-up assist. ?OT Short Term Goal 3 (Week 1): Patient will complete 3/3 parts of toileting task with Min A and LRAD. ?OT Short Term Goal 4 (Week 1): Patient will complete toilet transfer with Min A and LRAD. ? ?Skilled Therapeutic Interventions/Progress Updates:  ?  Pt resting in w/c upon arrival. Pt reports that his "whole body is hurting/aching" but ready to get moving with therapy. OTA assisted with donning Ted hose for RLE edema mgmt. Pt transitioned to ADL apartmemt and practiced bed mobility on standard bed in apt. Max A for BLE mgmt with sit>supine. Supervision for supine>sit EOB. All transfers with RW at [facility]. Discussed kitchen set up. Pt reports that he takes his food back to his room after preparation, using his Rollator for transport. OTA provided Rollator for pt to practice. Pt immediately recognized that using a Rollator at this time is not as stable and not preferred. Pt reports that his wife can take his meals back to his room after he has prepared them. Pt reports that he currently sits in a rolling office chair. Recommended to use a different chair and educated pt on safety risks with rolling chair. Pt propelled w/c back to room and remained seated in w/c. All needs within reach.  ? ?Therapy Documentation ?Precautions:  ?Precautions ?Precautions: Fall ?Restrictions ?Weight Bearing Restrictions: Yes ?RUE Weight Bearing: Weight bearing as tolerated ?RLE Weight Bearing: Weight bearing as tolerated ?Other Position/Activity Restrictions: RUE shoulder </ 90' flexion and abduction ?  ?Pain: ? Pt  reports 7/10 Rt hip/RLE pain; meds admin during therapy ? ? ?Therapy/Group: Individual Therapy ? ?[name] ?05/25/2021, 10:15 AM ?

## 2021-05-25 NOTE — Progress Notes (Signed)
Pt already on CPAP for bed. RT will cont to monitor.  °

## 2021-05-25 NOTE — Progress Notes (Signed)
Physical Therapy Session Note ? ?Patient Details  ?Name: Jack Neal ?MRN: [id number] ?Date of Birth: 1945-05-21 ? ?Today's Date: 05/25/2021 ?PT Individual Time: 1443-1540; [id number] ?PT Individual Time Calculation (min): 68 min and 50 min ?PT Missed Time: 10 min ?Missed Time Reason: patient fatigue; pain ? ?Short Term Goals: ?Week 1:  PT Short Term Goal 1 (Week 1): Pt will ambulate x 50 ft with LRAD and min A ?PT Short Term Goal 2 (Week 1): Pt will perform bed mobility with Supervision ?PT Short Term Goal 3 (Week 1): Pt will performing objective functional outcome measure ? ?Skilled Therapeutic Interventions/Progress Updates:  ?  Session 1: ?Pt received seated in w/c in room, agreeable to PT session. Pt reports 7/10 pain in his R hip and R shoulder region, premedicated prior to start of therapy session. Provided ice pack at end of session for pain management. Pt reports pain in R shoulder with w/c propulsion, demonstrated how to propel w/c with use of L UE and LE, he is able to propel x 150 ft at Supervision level. Sit to stand with CGA to RW during session. Ambulation x 250 ft with RW and CGA for balance, noticeable decreased in gait speed with onset of fatigue. Pt exhibits antalgic gait pattern with decreased tolerance for stance on RLE, offloading with use of RW and shoulder elevation. Pt requires cues to keep head upright during gait. Pt also exhibits decreased step length with LLE with step-to gait pattern. Attempt to practice bed mobility with patient, he reports he has significant increase in pain with bed mobility and prefers to defer this task at this time. Pt wanting to demonstrate standing exercises he has been doing at the sink: hip flexion, hip abd, hip ext, heel/toe raises x 10-15 reps with BUE support on sink. Demonstrated how to perform hip ext and HS curls separately as well as marches. Provided new w/c with RLE for improved edema management for patient. Pt left seated in w/c in room with needs in  reach at end of session. ? ?Session 2: ?Pt received seated in w/c in room, agreeable to PT session. Pt reports increase in R hip pain this PM to 8/10, able to receive pain medication during session and utilizes ice packs at end of session for pain management. Pt reports increase in RLE pain from positioning on ELR and also increased discomfort with use of new w/c. Obtained previous w/c and standard leg rests and pt reports improved comfort. Stand pivot transfer w/c to Nustep with RW and CGA. Nustep level 2 x 11 min with use of B UE/LE for global endurance training and focus on gentle ROM of RLE. Pt reports no increase in pain with use of Nustep but does need assist managing RLE on/off of machine. Ambulation x 50 ft with RW and CGA following Nustep before onset of pain prevents further gait. Pt continues to exhibit antalgic gait pattern with L lateral lean noted due to decreased tolerance for WB on RLE. Pt declines further activity following gait due to pain. Pt left seated in w/c in room with needs in reach. Pt missed 10 min of scheduled therapy session due to pain and fatigue. ? ?Therapy Documentation ?Precautions:  ?Precautions ?Precautions: Fall ?Restrictions ?Weight Bearing Restrictions: Yes ?RUE Weight Bearing: Weight bearing as tolerated ?RLE Weight Bearing: Weight bearing as tolerated ?Other Position/Activity Restrictions: RUE shoulder </ 90' flexion and abduction ? ? ? ? ? ? ?Therapy/Group: Individual Therapy ? ? ?Peter Congo, PT, DPT, CSRS ? ?05/25/2021, 12:20 PM  ?

## 2021-05-26 MED ORDER — PSEUDOEPHEDRINE HCL 30 MG PO TABS
30.0000 mg | ORAL_TABLET | Freq: Every day | ORAL | Status: DC | PRN
  Administered 2021-05-26 – 2021-06-01 (×5): 30 mg via ORAL
  Filled 2021-05-26 (×6): qty 1

## 2021-05-26 NOTE — Progress Notes (Signed)
?                                                       PROGRESS NOTE ? ? ?Subjective/Complaints: ? ?Pt reports LBM yesterday ?Needs to go again.  ?Denies constipation.  ?Having blocked eustachian tubes  - this occurs frequently and takes sudafed prn- will write for this.  ? ?ROS: ? ?Pt denies SOB, abd pain, CP, N/V/C/D, and vision changes ? ? ? ?Objective: ?  ?No results found. ?No results for input(s): WBC, HGB, HCT, PLT in the last 72 hours. ? ?No results for input(s): NA, K, CL, CO2, GLUCOSE, BUN, CREATININE, CALCIUM in the last 72 hours. ? ? ?Intake/Output Summary (Last 24 hours) at 05/26/2021 1128 ?Last data filed at 05/26/2021 0700 ?Gross per 24 hour  ?Intake 360 ml  ?Output 1950 ml  ?Net -1590 ml  ?  ? ?  ? ?Physical Exam: ?Vital Signs ?Blood pressure 105/67, pulse 81, temperature 97.7 ?F (36.5 ?C), temperature source Oral, resp. rate 16, height 5\' 11"  (1.803 m), SpO2 100 %. ? ? ? ? ? ?General: awake, alert, appropriate, c/o eustachian tube fullness/blockage; NAD ?HENT: conjugate gaze; oropharynx moist- TTP over sinuses B/L  ?CV: regular rate; no JVD ?Pulmonary: CTA B/L; no W/R/R- good air movement ?GI: soft, NT, ND, (+)BS ?Psychiatric: appropriate ?Neurological: Ox3 ? ?Musculoskeletal:Swelling on R hip- original dressing- looks stable ?   Cervical back: Neck supple. No tenderness.  ?   Comments: UE strength 5/5 B/L ?LLE- 5/5 in HF, KE, DF and PF ?RLE- 2/5 in proximal RLE; 5/5 distally ?TTP over R lateral hip and groin as well as R acromium - no change ?Skin: ?   Comments: Right hip incision is dressed. ?Moderate swelling; no erythema; original surgical dressing in place ?Large purple bruise inner upper R arm ?1-2+ LE edema to distal calves B/L ?Venous stasis changes distal 1/3 of calves B/L ?R forearm IV - looks OK - looks OK ?Neurological:  ?   Mental Status: He is alert and oriented to person, place, and time.  ?   Comments: Patient is alert.  Makes eye contact with examiner.  Provides name and age.   Limited medical historian.  Follows commands. ?Decreased sensation to light touch in lower legs bowel knees B/L  ?Otherwise, intact above knees and in arms  ? ?Assessment/Plan: ?1. Functional deficits which require 3+ hours per day of interdisciplinary therapy in a comprehensive inpatient rehab setting. ?Physiatrist is providing close team supervision and 24 hour management of active medical problems listed below. ?Physiatrist and rehab team continue to assess barriers to discharge/monitor patient progress toward functional and medical goals ? ?Care Tool: ? ?Bathing ? Bathing activity did not occur: Refused ?Body parts bathed by patient: Right arm, Left arm, Chest, Abdomen, Front perineal area, Buttocks, Right upper leg, Left upper leg, Right lower leg, Left lower leg, Face  ?   ?  ?  ?Bathing assist Assist Level: Contact Guard/Touching assist ?  ?  ?Upper Body Dressing/Undressing ?Upper body dressing Upper body dressing/undressing activity did not occur (including orthotics): Refused ?What is the patient wearing?: Pull over shirt ?   ?Upper body assist Assist Level: Supervision/Verbal cueing ?   ?Lower Body Dressing/Undressing ?Lower body dressing ? ? ?   ?What is the patient wearing?: Pants, Incontinence brief ? ?  ? ?  Lower body assist Assist for lower body dressing: Minimal Assistance - Patient > 75% ?   ? ?Toileting ?Toileting Toileting Activity did not occur Landscape architect and hygiene only): Refused  ?Toileting assist Assist for toileting: Minimal Assistance - Patient > 75% ?  ?  ?Transfers ?Chair/bed transfer ? ?Transfers assist ?   ? ?Chair/bed transfer assist level: Contact Guard/Touching assist ?  ?  ?Locomotion ?Ambulation ? ? ?Ambulation assist ? ?   ? ?Assist level: Contact Guard/Touching assist ?Assistive device: Walker-rolling ?Max distance: 250 ft  ? ?Walk 10 feet activity ? ? ?Assist ?   ? ?Assist level: Contact Guard/Touching assist ?Assistive device: Walker-rolling  ? ?Walk 50 feet  activity ? ? ?Assist Walk 50 feet with 2 turns activity did not occur: Safety/medical concerns ? ?Assist level: Contact Guard/Touching assist ?Assistive device: Walker-rolling  ? ? ?Walk 150 feet activity ? ? ?Assist Walk 150 feet activity did not occur: Safety/medical concerns ? ?Assist level: Contact Guard/Touching assist ?Assistive device: Walker-rolling ?  ? ?Walk 10 feet on uneven surface  ?activity ? ? ?Assist Walk 10 feet on uneven surfaces activity did not occur: Safety/medical concerns ? ? ?  ?   ? ?Wheelchair ? ? ? ? ?Assist Is the patient using a wheelchair?: Yes ?Type of Wheelchair: Manual ?  ? ?Wheelchair assist level: Supervision/Verbal cueing ?Max wheelchair distance: 150'  ? ? ?Wheelchair 50 feet with 2 turns activity ? ? ? ?Assist ? ?  ?  ? ? ?Assist Level: Supervision/Verbal cueing  ? ?Wheelchair 150 feet activity  ? ? ? ?Assist ?   ? ? ?Assist Level: Supervision/Verbal cueing  ? ?Blood pressure 105/67, pulse 81, temperature 97.7 ?F (36.5 ?C), temperature source Oral, resp. rate 16, height 5\' 11"  (1.803 m), SpO2 100 %. ? ?Medical Problem List and Plan: ?1. Functional deficits secondary to acute minimally displaced fracture of right acromion with conservative care/weightbearing as tolerated.  Acute comminuted angulated intertrochanteric fracture of right hip with moderate surrounding intramuscular interstitial hemorrhage.  Status post open treatment with intramedullary implant and interlocking screws 05/15/2021.  Weightbearing as tolerated. ?            -patient may  shower if dressing covered ?            -ELOS/Goals: 10-14 days supervision ? Con't CIR- PT and OT ?2.  Antithrombotics: ?-DVT/anticoagulation:  Pharmaceutical: Lovenox x6 weeks.  Check vascular study ?            -antiplatelet therapy: Aspirin 81 mg daily and Plavix 75 mg daily ?3. Pain Management/chronic pain/peripheral polyneuropathy: Maintained on Suboxone 2-0.5 mg 1 tablet daily as prior to admission followed by Dr.IHEAGWARA at the  [facility] [phone] extension [phone], Voltaren gel as needed, Robaxin as needed, hydrocodone as needed ? 3/9- not taking Norco much- makes him confused/drugged feeling- will change to [facility] for now 15 mg- since had good response to IV morphine- won't use IV. Will monitoring behavior closely, since on Suboxone and MSIR ? 3/10- pain worse this AM- will add lidocaine patches 8am to 8pm- likely due to weather, making it worse ? 3/13- pain doing better- con't regimen ?4. Mood/PTSD with dementia: Cymbalta 60 mg daily and 30 mg nightly, Namenda 10 mg daily and 20 mg nightly,Razadyne 16 mg nightly ? 3/9- doesn't want to change depression meds- just needs to talk about where he's at- emotional support ?            -antipsychotic agents: N/A ?5. Neuropsych: This  patient is capable of making decisions on his own behalf. ?6. Skin/Wound Care: Routine skin checks.  Keep Aquacel on and dry for up to 14 days. ?7. Fluids/Electrolytes/Nutrition: Routine in and outs with follow-up chemistries ?8.  Hypertension.  Monitor with increased mobility ?9.  OSA.  CPAP ?10.  Acute blood loss anemia.  S/p 2 units pRBCs. Follow-up CBC.  Continue iron supplement ?11.  Atrial fibrillation/pacemaker/watchman.  Cardiac rate controlled. ?12.  Pancreatic insufficiency.  Pancrelipase ?13. GERD_ just restarted Porotonix- having a lot of GERD Sx's- con't tums prn ? 3/8- reflux better this AM- con't regimen ?14. Azotemia ? 3/9- will recheck Monday- BUN up to 26 and Cr ok- likely dry- will push fluids ? 3/12- labs in AM ? 3/13- BUN 26- and Cr -.66- will ask him ot dirnk more or will need IVFs.  ?15. Sinus/inner ear pain ? 3/13- will give sudafed daily prn for eustachian tube blockage.  ?16. Dispo  ?3/9- will d/c IV ? 3/10- will write for grounds pass for pt mood ? ? ? ? I spent a total of 39   minutes on total care today- >50% coordination of care- due to d/w nursing and d/w pt about IVFs vs drinking- also will give  Sudafed for eustachian tube issues.  ? ? ? ? ?LOS: ?6 days ?A FACE TO FACE EVALUATION WAS PERFORMED ? ?Jack Neal ?05/26/2021, 11:28 AM  ? ? ? ?

## 2021-05-26 NOTE — Progress Notes (Signed)
Physical Therapy Session Note ? ?Patient Details  ?Name: Jack Neal ?MRN: [id number] ?Date of Birth: 1945-09-24 ? ?Today's Date: 05/26/2021 ?PT Individual Time: 0800-0900; 1600-1700 ?PT Individual Time Calculation (min): 60 min and 60 min ? ?Short Term Goals: ?Week 1:  PT Short Term Goal 1 (Week 1): Pt will ambulate x 50 ft with LRAD and min A ?PT Short Term Goal 2 (Week 1): Pt will perform bed mobility with Supervision ?PT Short Term Goal 3 (Week 1): Pt will performing objective functional outcome measure ?   ? ?Skilled Therapeutic Interventions/Progress Updates:  ? ?Session 1: ?Pt was received lying recumbent in bed and was agreeable for therapy today. He reports that he had very restful sleep without any disruptive pain throughout the night, but since moving around this AM his pain has increased to a 6/10 in his shoulder > hip. Nurse provided pain medication shortly after. Pt was able to move from recumbent > sitting edge of bed with supervision and HOB elevated. He required set-up assist for donning pants but required some assistance to don shoes. Pt performed STS from bed with rolling walker and supervision but then reported mild dizziness. After dizziness subsided, pt required contact guard to safely transfer to W/C with rolling walker. Pt requested to be transferred to the therapy gym due to dizziness and feeling unsafe to ambulate at this time. He transferred from W/C to Nustep with rolling walker from the R side to decrease strain on his R hip, performed with min A. Began NuStep on Level 2 for 10 minutes, then progressed to Level 4 for 6 minutes. Pt reports that movement on the NuStep initially caused increased pain in his R hip to a 7/10 along with cavitation in his R knee, but both subside after 5-6 minutes. Pt also requires min A on NuStep to avoid painful knee valgus but he remains motivated and requested to continue the activity for 1 extra minute. Pt moved from Nustep to standing with contact guard,  then directly proceeded to ambulate for 150 ft with RW and pt reports a noticeable L lean with fatigue that he was attempting to try and correct. He performed one 180 deg turn at the end of hallway, before continuing ambulation back to his room. PT continued to provide contact guard for ambulation due to intermittent reports of dizziness. Pt requested to transfer directly to [facility] upon arrival in his room and was able to remove pants with supervision. Pt was left with prepared washcloths and needs in reach and confirmed that he would call nursing when he was finished with toileting. ? ?Session 2: ?Pt was received sitting in wheelchair and agreeable to therapy. Pt reports that pain has improved since this AM but his R shoulder continues to cause him pain with any reaching or UE elevation. Because of this, he requested to not have to propel his W/C but is not opposed to ambulating with RW. After gait belt was donned, pt performed a STS from W/C to his RW with CGA, and proceeded to ambulate 62ft to the elevators with RW and CGA. Pt states that he wanted to attempt stepping into the elevator and proceeded to do so using his RW and CGA with verbal cues to attend to the uneven flooring when stepping in. Pt performed a 180 degree turn inside, then safely stepped off the elevator to the next floor. Pt ambulated with RW to the therapy apartment and performed stand-to-sit onto the bed with CGA. Pt was instructed on how to  use his gait belt as a leg lift to assist with moving from sit > supine on the R side of the bed (which is his preferred side to perform on due to RLE pain). Pt required mod A while using leg lift around the RLE to transfer to supine in the bed, and required additional time for pain to settle once in the bed. Pt was able to perform supine > sitting at edge of the bed with much more ease, only requiring supervision while using leg lift. Pt stood up to RW with supervision and ambulated to therapy gym with  CGA. ? ? PT demonstrated modified tandem stance exercise before instructing pt to perform it. Pt stood up to perform modified tandem stance bilaterally at his rolling walker for 2 sets of 30sec each side, with CGA and verbal cuing to remain upright looking straight ahead and to try and distribute his BW evenly between both LE's. Pt demonstrated mild lateral sway but was able to correct posture without cuing. He did not require any seated breaks during these trials. Last trial was discontinued after 15sec due to pt reports of shooting pain in his RLE. Pt performed stand-to-sit from RW back into W/C with CGA and was transported back to his room for time management. Pt was left sitting in W/C with 2 ice packs and all needs in reach. Pt was able to ambulate using his RW with supervision intermittently but CGA was provided majority of the time due to pt's reports of vertigo being unpredictable. ? Therapy Documentation ?Precautions:  ?Precautions ?Precautions: Fall ?Restrictions ?Weight Bearing Restrictions: Yes ?RUE Weight Bearing: Weight bearing as tolerated ?RLE Weight Bearing: Weight bearing as tolerated ?Other Position/Activity Restrictions: RUE shoulder </ 90' flexion and abduction ?Therapy/Group: Individual Therapy ? ?Charmian Muff SPT ? ?05/26/2021, 6:16 PM  ?

## 2021-05-26 NOTE — Progress Notes (Signed)
Occupational Therapy Session Note ? ?Patient Details  ?Name: Jack Neal ?MRN: [id number] ?Date of Birth: [birth year] ? ?Today's Date: 05/26/2021 ?OT Individual Time: 0272-5366 ?OT Individual Time Calculation (min): 75 min  ? ? ?Short Term Goals: ?Week 1:  OT Short Term Goal 1 (Week 1): Patient will complete LB bathing with Min A and use of AE/DME. ?OT Short Term Goal 2 (Week 1): Patient will don UB clothing with set-up assist. ?OT Short Term Goal 3 (Week 1): Patient will complete 3/3 parts of toileting task with Min A and LRAD. ?OT Short Term Goal 4 (Week 1): Patient will complete toilet transfer with Min A and LRAD. ? ?Skilled Therapeutic Interventions/Progress Updates:  ?  Pt resting in w/c upon arrival. OT intervention with focus on bahting/dressing with sit<>stand from w/c at sink. Pt stood at sink to complete all bathing tasks except feet which he completed while seated in w/c. Pt stood at sink to wash his hair bending over sink. Pt required assistance donning RLE Ted hose but completed all other bathing/dressing tasks with CGA when standing. Pt reported that he was surprised about how exhausting bathing/dressing tasks can be. Pt amb with RW from room to nursing station and back to room. Pt remained seated in w/c with all needs within reach.  ? ?Therapy Documentation ?Precautions:  ?Precautions ?Precautions: Fall ?Restrictions ?Weight Bearing Restrictions: Yes ?RUE Weight Bearing: Weight bearing as tolerated ?RLE Weight Bearing: Weight bearing as tolerated ?Other Position/Activity Restrictions: RUE shoulder </ 90' flexion and abduction ?  ?Pain: ?Pain Assessment ?Pain Scale: Faces ?Faces Pain Scale: Hurts little more ?Pain Type: Acute pain ?Pain Location: Hip ?Pain Orientation: Right ?repositioned ? ? ?Therapy/Group: Individual Therapy ? ?[name] ?05/26/2021, 11:35 AM ?

## 2021-05-27 LAB — CBC WITH DIFFERENTIAL/PLATELET
Abs Immature Granulocytes: 0.01 10*3/uL (ref 0.00–0.07)
Basophils Absolute: 0 10*3/uL (ref 0.0–0.1)
Basophils Relative: 1 %
Eosinophils Absolute: 0.2 10*3/uL (ref 0.0–0.5)
Eosinophils Relative: 5 %
HCT: 28.1 % — ABNORMAL LOW (ref 39.0–52.0)
Hemoglobin: 8.9 g/dL — ABNORMAL LOW (ref 13.0–17.0)
Immature Granulocytes: 0 %
Lymphocytes Relative: 27 %
Lymphs Abs: 1 10*3/uL (ref 0.7–4.0)
MCH: 33 pg (ref 26.0–34.0)
MCHC: 31.7 g/dL (ref 30.0–36.0)
MCV: 104.1 fL — ABNORMAL HIGH (ref 80.0–100.0)
Monocytes Absolute: 0.5 10*3/uL (ref 0.1–1.0)
Monocytes Relative: 13 %
Neutro Abs: 2.1 10*3/uL (ref 1.7–7.7)
Neutrophils Relative %: 54 %
Platelets: 267 10*3/uL (ref 150–400)
RBC: 2.7 MIL/uL — ABNORMAL LOW (ref 4.22–5.81)
RDW: 18.9 % — ABNORMAL HIGH (ref 11.5–15.5)
WBC: 3.8 10*3/uL — ABNORMAL LOW (ref 4.0–10.5)
nRBC: 0 % (ref 0.0–0.2)

## 2021-05-27 LAB — BASIC METABOLIC PANEL
Anion gap: 8 (ref 5–15)
BUN: 26 mg/dL — ABNORMAL HIGH (ref 8–23)
CO2: 34 mmol/L — ABNORMAL HIGH (ref 22–32)
Calcium: 8.9 mg/dL (ref 8.9–10.3)
Chloride: 97 mmol/L — ABNORMAL LOW (ref 98–111)
Creatinine, Ser: 0.74 mg/dL (ref 0.61–1.24)
GFR, Estimated: >60 mL/min (ref >60)
Glucose, Bld: 87 mg/dL (ref 70–99)
Potassium: 4.2 mmol/L (ref 3.5–5.1)
Sodium: 139 mmol/L (ref 135–145)

## 2021-05-27 MED ORDER — TRAZODONE HCL 50 MG PO TABS
50.0000 mg | ORAL_TABLET | Freq: Every day | ORAL | Status: DC
  Administered 2021-05-27 – 2021-06-02 (×7): 50 mg via ORAL
  Filled 2021-05-27 (×7): qty 1

## 2021-05-27 NOTE — Progress Notes (Signed)
Occupational Therapy Weekly Progress Note ? ?Patient Details  ?Name: Jack Neal ?MRN: [id number] ?Date of Birth: 10/22/1945 ? ?Beginning of progress report period: May 21, 2021 ?End of progress report period: May 27, 2021 ? ? ?Patient has met 4 of 4 short term goals.  Pt is making steady progress with BADLs and functional transfers. Pt completes bathing while standing at sink and sitting in w/c to wash feet-CGA for standing balance. Dressing with sit<>stand from w/c. Pt requires assistance donning RLE Ted Hose ?And Rt shoe. All transfers using RW with CGA. ?Patient continues to demonstrate the following deficits: muscle weakness, decreased cardiorespiratoy endurance, and decreased standing balance, decreased balance strategies, and difficulty maintaining precautions and therefore will continue to benefit from skilled OT intervention to enhance overall performance with BADL and Reduce care partner burden. ? ?Patient progressing toward long term goals..  Continue plan of care. ? ?OT Short Term Goals ?Week 1:  OT Short Term Goal 1 (Week 1): Patient will complete LB bathing with Min A and use of AE/DME. ?OT Short Term Goal 1 - Progress (Week 1): Met ?OT Short Term Goal 2 (Week 1): Patient will don UB clothing with set-up assist. ?OT Short Term Goal 2 - Progress (Week 1): Met ?OT Short Term Goal 3 (Week 1): Patient will complete 3/3 parts of toileting task with Min A and LRAD. ?OT Short Term Goal 3 - Progress (Week 1): Met ?OT Short Term Goal 4 (Week 1): Patient will complete toilet transfer with Min A and LRAD. ?OT Short Term Goal 4 - Progress (Week 1): Met ?Week 2:  OT Short Term Goal 1 (Week 2): STG=LTG 2/2 ELOS (continue working towards mod I LTGs) ? ? ?Lanier, Thomas Chappell ?05/27/2021, 6:51 AM  ?

## 2021-05-27 NOTE — Progress Notes (Addendum)
Physical Therapy Weekly Progress Note ? ?Patient Details  ?Name: Jack Neal ?MRN: [id number] ?Date of Birth: [birth year] ? ?Beginning of progress report period: May 21, 2021 ?End of progress report period: May 28, 2021 ? ?Today's Date: 05/28/2021 ?PT Individual Time: 3016-0109 ?PT Individual Time Calculation (min): 70 min  ? ?Patient has met 3 of 3 short term goals. He was able to ambulate for 100-133f today with RW CGA and has ambulated for a max distance of 2536fwith RW CGA during his stay. He performs bed mobility with supervision and use of a leg lift. He performed the TUG today with RW CGA in 38 seconds. Pt is CGA to supervision with transfers. Supervision with w/c mobility. ? ?Patient continues to demonstrate the following deficits muscle weakness and muscle joint tightness, decreased cardiorespiratoy endurance, decreased memory, and vestibular issues of unknown origin  and therefore will continue to benefit from skilled PT intervention to increase functional independence with mobility. ? ?Patient progressing toward long term goals..  Continue plan of care. ? ?PT Short Term Goals ?Week 1:  PT Short Term Goal 1 (Week 1): Pt will ambulate x 50 ft with LRAD and min A ?PT Short Term Goal 1 - Progress (Week 1): Met ?PT Short Term Goal 2 (Week 1): Pt will perform bed mobility with Supervision ?PT Short Term Goal 2 - Progress (Week 1): Met ?PT Short Term Goal 3 (Week 1): Pt will performing objective functional outcome measure ?PT Short Term Goal 3 - Progress (Week 1): Met ?Week 2:  PT Short Term Goal 1 (Week 2): = LTG d/t ELOS ? ?Skilled Therapeutic Interventions/Progress Updates:  ?Ambulation/gait training;Balance/vestibular training;Community reintegration;Discharge planning;Disease management/prevention;DME/adaptive equipment instruction;Functional mobility training;Pain management;Patient/family education;Therapeutic Activities;Therapeutic Exercise;UE/LE Strength taining/ROM;UE/LE Coordination  activities;Wheelchair propulsion/positioning;Psychosocial support  ? ?Pt was received sitting in W/C and was agreeable to therapy today. He reports decreased pain compared to yesterday; 6/10 in R shoulder, 3/10 in R hip. Pt is CGA for ambulation with a RW with occasional onset of vertigo. Pt is able to maintain static standing with CGA until vertigo episode subsides. Pt ambulated for a max distance of 15080foday with RW CGA, as well as up/down a ramp with RW CGA. Pt is CGA to supervision with all transfers using RW during session. Pt performed TUG for 3 trials with average of 38 seconds which indicates high fall risk. Pt performed car transfer and bed mobility from RW with the use of a leg lift, supervision. He is able to consistently walk onto elevator and perform 180 deg turns on an even surface, both with RW CGA. Pt also demonstrates increased WB on the RLE and less L lean with gait. He reported increased pain (not rated) in his RLE towards the end of session and was transported in W/C back to his room. Pt performed transfer into recliner with CGA and was assisted by PT to doff TED hose and don SCDs. Pt left in recliner with BLE's elevated, ice pack, and all other needs in reach.  ? ?Therapy Documentation ?Precautions:  ?Precautions ?Precautions: Fall ?Restrictions ?Weight Bearing Restrictions: Yes ?RUE Weight Bearing: Weight bearing as tolerated ?RLE Weight Bearing: Weight bearing as tolerated ?Other Position/Activity Restrictions: RUE shoulder </ 90' flexion and abduction ? ? ? ?Therapy/Group: Individual Therapy ? ?RebLanetta InchayExcell SeltzerT, DPT, CSRS ?05/28/2021, 12:08 PM  ?

## 2021-05-27 NOTE — Progress Notes (Addendum)
Physical Therapy Session Note ? ?Patient Details  ?Name: Jack Neal ?MRN: [id number] ?Date of Birth: 11-Aug-1945 ? ?Today's Date: 05/27/2021 ?PT Individual Time: 0800-0900 ?PT Individual Time Calculation (min): 60 min  ? ?Short Term Goals: ?Week 1:  PT Short Term Goal 1 (Week 1): Pt will ambulate x 50 ft with LRAD and min A ?PT Short Term Goal 2 (Week 1): Pt will perform bed mobility with Supervision ?PT Short Term Goal 3 (Week 1): Pt will performing objective functional outcome measure ? ?Skilled Therapeutic Interventions/Progress Updates:  ?   ?Patient in the bathroom upon PT arrival. Patient alert and agreeable to PT session. Patient reported 6/10 R hip and shoulder pain during session, RN aware and provided meds during session. PT provided repositioning, rest breaks, and distraction as pain interventions throughout session.  ? ?Patient stood from the toilet with CGA using RW, performed peri-care and lower body clothing management independently with CGA for standing balance without upper extremity support. Patient performed ambulatory transfer to EOB with CGA. Noted significant R lower extremity edema, LPN aware and reports MD assessed patient's edema this morning. B knee high TED hose donned for edema control with total A. Patient donned pants with set-up assist, standing with CGA to pull pants over his hips.  ? ?Focused remainder of session on Vestibular Assessment, see below. ? ? Vestibular Assessment - 05/27/21 0001   ? ?  ? Vestibular Assessment  ? General Observation hx of dizziness in standing 2-3x per day since 2018   ?  ? Symptom Behavior  ? Subjective history of current problem dizziness with mobility with therapies following hip fx after a fall   ? Type of Dizziness  Lightheadedness;"World moves"   ? Frequency of Dizziness 2-3xper day   ? Duration of Dizziness continuous   increased with movement 1-2 min per pt report  ? Symptom Nature Positional;Spontaneous;Intermittent   ? Aggravating Factors  Driving;Walking in a crowd   ? Relieving Factors Lying supine;Rest;Closing eyes   ? Progression of Symptoms Worse   ? History of similar episodes fluctuation of intensity of symptoms since 2018   ?  ? Oculomotor Exam  ? Oculomotor Alignment Normal   ? Ocular ROM WFL   ? Spontaneous Absent   ? Gaze-induced  Age appropriate nystagmus at end range   ? Smooth Pursuits Saccades   ? Saccades Poor trajectory;Slow   ?  ? Vestibulo-Ocular Reflex  ? VOR 1 Head Only (x 1 viewing) 6/10 dizziness reported   ?  ? Auditory  ? Comments Reports auditory fullness with acute hearing loss with onset x2-3 days, hx of this in the past   ?  ? Positional Testing  ? Dix-Hallpike Dix-Hallpike Right;Dix-Hallpike Left   ? Horizontal Canal Testing Horizontal Canal Left;Horizontal Canal Right   ?  ? Dix-Hallpike Right  ? Dix-Hallpike Right Duration baseline dizziness continued, no increase in syptoms, no nystagmus present   ? Dix-Hallpike Right Symptoms No nystagmus   ?  ? Dix-Hallpike Left  ? Dix-Hallpike Left Duration baseline dizziness continued, no increase in syptoms, no nystagmus present   ? Dix-Hallpike Left Symptoms No nystagmus   ?  ? Horizontal Canal Right  ? Horizontal Canal Right Duration increased symptoms 45-60 sec   ? Horizontal Canal Right Symptoms Geotrophic;Nystagmus   ?  ? Horizontal Canal Left  ? Horizontal Canal Left Duration 5-10 sec of increased symptoms, no nystagmus   ? Horizontal Canal Left Symptoms Normal   ?  ? Cognition  ? Cognition Orientation  Level Oriented x 4   ?  ? Orthostatics  ? BP supine (x 5 minutes) 116/73   ? HR supine (x 5 minutes) 90   ? BP sitting 91/63   ? HR sitting 84   ? BP standing (after 1 minute) 87/58   ? HR standing (after 1 minute) 83   ? BP standing (after 3 minutes) 101/63   ? HR standing (after 3 minutes) 81   ? Orthostatics Comment symptomatic with reports of feeling "light headed" once in standing with sudden onset of "vertigo"   ? ?  ?  ? ?  ? ?Patient also reports diplopia since 2018,  far distances>near. Resolved with either eye covered. Notable challenge with divergence, convergence WFL. ? 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 Recommend follow-up with ENT for auditory fullness, tinitus, and chronic symptoms of vertigo, MD and therapy team made aware. ? ?Patient in bed at end of session with breaks locked, bed alarm set, and all needs within reach.  ? ?Therapy Documentation ?Precautions:  ?Precautions ?Precautions: Fall ?Restrictions ?Weight Bearing Restrictions: Yes ?RUE Weight Bearing: Weight bearing as tolerated ?RLE Weight Bearing: Weight bearing as tolerated ?Other Position/Activity Restrictions: RUE shoulder </ 90' flexion and abduction ? ? ? ?Therapy/Group: Individual Therapy ? ?Helayne Seminole PT, DPT ? ?05/27/2021, 10:01 PM  ?

## 2021-05-27 NOTE — Progress Notes (Signed)
Occupational Therapy Session Note ? ?Patient Details  ?Name: Jack Neal ?MRN: [id number] ?Date of Birth: 04/08/1945 ? ?Today's Date: 05/27/2021 ?OT Individual Time: 3474-2595 ?OT Individual Time Calculation (min): 60 min  and Today's Date: 05/27/2021 ?OT Missed Time: 15 Minutes ?Missed Time Reason: Pain;Patient fatigue ? ? ?Short Term Goals: ?Week 2:  OT Short Term Goal 1 (Week 2): STG=LTG 2/2 ELOS (continue working towards mod I LTGs) ? ?Skilled Therapeutic Interventions/Progress Updates:  ?  Pt resting in bed upon arrival and agreeable to getting OOB for therapy. Pt reported that he did not rest well during the night. Pt also reported that his Rt shoulder was more painful this morning. Pt also reported increased pain in RLE with activity. See below. Supine>sit EOB with supervision. Sit<>stand and transfer to w/c with CGA. Pt propelled w/c to ADL apartment and practiced bed mobility. Sit<>supine in bed with supervision. Pt propelled w/c to ortho gym and engaged in [place] tasks. Pt stood at [facility] without BUE support to complete visual scanning and sequencing tasks X 4 with CGA. LOB x 1 with min A to correct. Pt continued to increase. Pt returned to room and meds requested from LPN. Pt agreed to transfer to recliner to elevate BLE for edema mgmt. Meds admin by LPN. Pt remained in recliner. Belt alarm activated. All needs within reach.  ? ?Therapy Documentation ?Precautions:  ?Precautions ?Precautions: Fall ?Restrictions ?Weight Bearing Restrictions: Yes ?RUE Weight Bearing: Weight bearing as tolerated ?RLE Weight Bearing: Weight bearing as tolerated ?Other Position/Activity Restrictions: RUE shoulder </ 90' flexion and abduction ?General: ?General ?OT Amount of Missed Time: 15 Minutes ?Pain: ?Pt c/o 7/10 Rt shoulder and LLE pain; repositioned and meds admin during therapy ? ?Therapy/Group: Individual Therapy ? ?[name] ?05/27/2021, 10:55 AM ?

## 2021-05-27 NOTE — Progress Notes (Signed)
Patient ID: Jack Neal, male   DOB: 12-04-1945, 76 y.o.   MRN: [id number] ? ?SW made efforts to update pt on d/c date on 3/21 but pt not in room. SW will f/u. ? ?1334-[id number] spoke with pt wife [name] [phone]) to provide updates from team conference and d/c date 3/21. Wife reports pt has RW and w/c in attic. She is not able to get the w/c out of the attic at this time. SW shared w/c is for community distance and if he becomes fatigued. She will make efforts if there is someone who can bring this down. SW informed HH will resume with [facility]. Fam edu scheduled for Friday [phone] 10am-12pm. ? ?SW updated [facility] and [facility] on above d/c date.  ? ?Cecile Sheerer, MSW, LCSWA ?Office: [phone] ?Cell: [phone] ?Fax: [phone]  ?

## 2021-05-27 NOTE — Patient Care Conference (Signed)
Inpatient RehabilitationTeam Conference and Plan of Care Update ?Date: 05/27/2021   Time: 11:07 AM  ? ? ?Patient Name: Jack Neal      ?Medical Record Number: [id number]  ?Date of Birth: 1946-03-09 ?Sex: Male         ?Room/Bed: 5C10C/5C10C-01 ?Payor Info: Payor: [employer] / Plan: [place] / Product Type: *No Product type* /   ? ?Admit Date/Time:  05/20/2021  1:29 PM ? ?Primary Diagnosis:  Intertrochanteric fracture of right hip ([place]) ? ?Hospital Problems: Principal Problem: ?  Intertrochanteric fracture of right hip ([place]) ? ? ? ?Expected Discharge Date: Expected Discharge Date: 06/03/21 ? ?Team Members Present: ?Physician leading conference: Dr. Courtney Heys ?Social Worker Present: Loralee Pacas, LCSWA ?Nurse Present: Dorthula Nettles, RN ?PT Present: Excell Seltzer, PT ?OT Present: Roanna Epley, Claris Gladden, OT ?PPS Coordinator present : Gunnar Fusi, SLP ? ?   Current Status/Progress Goal Weekly Team Focus  ?Bowel/Bladder ? ? cont of b/b, lbm 05/26/2021  remain cont of b/b  assess q shift & prn   ?Swallow/Nutrition/ Hydration ? ?           ?ADL's ? ? transers-CGA; standing balance during ADLs-CGA; LB dressing min A; toileting-min A  mod I overall  education, transfers, standing balance, activity tolerance   ?Mobility ? ? min to mod A bed mobility, CGA transfers with RW, gait up to 250 ft with RW CGA, Supervision w/c mobility  Supervision to mod I overall  bed mobility, transfers, gait, balance, pain management   ?Communication ? ?           ?Safety/Cognition/ Behavioral Observations ?           ?Pain ? ? 8 out of 10  2 out of 10  assess q shift & prn   ?Skin ? ? surgical incision to right hip  removal of dressing  assess q shift & prn   ? ? ?Discharge Planning:  ?Pt to d/c to home with his wife who can only provide supervision. SW [facility] to assist with ADLs. [facility] is [facility] with [facility].   ?Team Discussion: ?RLE swelling. Education on  elevation on-going. Will not increase pain medication. Ordered SCD's per patient request. Adding Trazodone for sleep. Request to push fluids to avoid IVF's. Added Sudafed for inner ear pain. Will schedule follow-up ENT outpatient. Questioning edema versus DVT. Continent B/B, reports pain. Receiving Morphine q 4hr. Added Lidocaine patches. Hip incision covered with surgical dressing til 05/29/2021. Wife unable to provide physical assist. [facility]. Will need RW and 18x18 WC, HH OT/PT. ? ?Patient on target to meet rehab goals: ?yes, supervision to mod I goals. Currently CGA > 250 ft. Wife will be able to take meals to room.  ? ?*See Care Plan and progress notes for long and short-term goals.  ? ?Revisions to Treatment Plan:  ?Adjusting medications. ?  ?Teaching Needs: ?Family education, medication/pain management, skin/wound care, safety awareness, transfer/gait training, etc. ?  ?Current Barriers to Discharge: ?Decreased caregiver support, Home enviroment access/layout, Wound care, Lack of/limited family support, and pain management. ? ?Possible Resolutions to Barriers: ?Family education ?Order recommended DME ?Follow-up HH OT/PT ?Follow-up ENT ?  ? ? Medical Summary ?Current Status: RLE dependent edema- won't elevate leg- is continent- hip inicision original dressing- ? Barriers to Discharge: Decreased family/caregiver support;Home enviroment access/layout;Medical stability;Weight bearing restrictions;Wound care ? Barriers to Discharge Comments: azotemia- BUN 26 and Cr 0.74; trying to  get him HHA since wife cannot help-- will need HH- ?Possible Resolutions to [employer]: is CGA- vestibular issues right now- some BPPV- limiting; limited by pain- already on MSIR and suboxone- needs RW- cannot use rollator; not safe; added trazodone for sleep and  elevate RLE due to swelling- pain biggest limiter- good progress- d/c 3/21 ? ? ?Continued Need for Acute Rehabilitation Level of Care: The  patient requires daily medical management by a physician with specialized training in physical medicine and rehabilitation for the following reasons: ?Direction of a multidisciplinary physical rehabilitation program to maximize functional independence : Yes ?Medical management of patient stability for increased activity during participation in an intensive rehabilitation regime.: Yes ?Analysis of laboratory values and/or radiology reports with any subsequent need for medication adjustment and/or medical intervention. : Yes ? ? ?I attest that I was present, lead the team conference, and concur with the assessment and plan of the team. ? ? ?Dorthula Nettles G ?05/27/2021, 3:35 PM  ? ? ? ? ? ? ?

## 2021-05-27 NOTE — Progress Notes (Signed)
?                                                       PROGRESS NOTE ? ? ?Subjective/Complaints: ? ?Pt reports having BM- on toilet right now.  ?Having RLE swelling- is concerned about this. Is wearing TEDs, but would like SCDs back if possible- thinks helped swelling.  ? ?Slept very poorly- thinks some due to pain and some couldn't relax.  ?  ? ?ROS: ? ?Pt denies SOB, abd pain, CP, N/V/C/D, and vision changes ? ? ? ?Objective: ?  ?No results found. ?Recent Labs  ?  05/27/21 ?0533  ?WBC 3.8*  ?HGB 8.9*  ?HCT 28.1*  ?PLT 267  ? ? ?Recent Labs  ?  05/27/21 ?0533  ?NA 139  ?K 4.2  ?CL 97*  ?CO2 34*  ?GLUCOSE 87  ?BUN 26*  ?CREATININE 0.74  ?CALCIUM 8.9  ? ? ? ?Intake/Output Summary (Last 24 hours) at 05/27/2021 0842 ?Last data filed at 05/27/2021 0537 ?Gross per 24 hour  ?Intake --  ?Output 1375 ml  ?Net -1375 ml  ?  ? ?  ? ?Physical Exam: ?Vital Signs ?Blood pressure (!) 90/59, pulse 72, temperature 97.9 ?F (36.6 ?C), resp. rate 15, height 5\' 11"  (1.803 m), SpO2 93 %. ? ? ? ? ? ? ?General: awake, alert, appropriate, on toilet with nurse in room; NAD ?HENT: conjugate gaze; oropharynx moist ?CV: regular rate; no JVD ?Pulmonary: CTA B/L; no W/R/R- good air movement ?GI: soft, NT, ND, (+)BS- hyperactive since having BM ?Psychiatric: appropriate- talkative ?Neurological: Ox3 ? ?Musculoskeletal:Swelling on R hip- original dressing- More swollen- moderate to severe in R hip, but also R knee and calf also significant swelling- 3+ mild pitting edema ?   Cervical back: Neck supple. No tenderness.  ?   Comments: UE strength 5/5 B/L ?LLE- 5/5 in HF, KE, DF and PF ?RLE- 2/5 in proximal RLE; 5/5 distally ?TTP over R lateral hip and groin as well as R acromium - no change ?Skin: ?   Comments: Right hip incision is dressed. ? no erythema; original surgical dressing in place- in place still;  ?Large purple bruise inner upper R arm ?1-2+ LE edema to distal calves B/L ?Venous stasis changes distal 1/3 of calves B/L ?R forearm IV - looks  OK - looks OK ?Neurological:  ?   Mental Status: He is alert and oriented to person, place, and time.  ?   Comments: Patient is alert.  Makes eye contact with examiner.  Provides name and age.  Limited medical historian.  Follows commands. ?Decreased sensation to light touch in lower legs bowel knees B/L  ?Otherwise, intact above knees and in arms  ? ?Assessment/Plan: ?1. Functional deficits which require 3+ hours per day of interdisciplinary therapy in a comprehensive inpatient rehab setting. ?Physiatrist is providing close team supervision and 24 hour management of active medical problems listed below. ?Physiatrist and rehab team continue to assess barriers to discharge/monitor patient progress toward functional and medical goals ? ?Care Tool: ? ?Bathing ? Bathing activity did not occur: Refused ?Body parts bathed by patient: Right arm, Left arm, Chest, Abdomen, Front perineal area, Buttocks, Right upper leg, Left upper leg, Right lower leg, Left lower leg, Face  ?   ?  ?  ?Bathing assist Assist Level: Contact Guard/Touching assist ?  ?  ?  Upper Body Dressing/Undressing ?Upper body dressing Upper body dressing/undressing activity did not occur (including orthotics): Refused ?What is the patient wearing?: Pull over shirt ?   ?Upper body assist Assist Level: Supervision/Verbal cueing ?   ?Lower Body Dressing/Undressing ?Lower body dressing ? ? ?   ?What is the patient wearing?: Pants, Incontinence brief ? ?  ? ?Lower body assist Assist for lower body dressing: Minimal Assistance - Patient > 75% ?   ? ?Toileting ?Toileting Toileting Activity did not occur Press photographer and hygiene only): Refused  ?Toileting assist Assist for toileting: Minimal Assistance - Patient > 75% ?  ?  ?Transfers ?Chair/bed transfer ? ?Transfers assist ?   ? ?Chair/bed transfer assist level: Contact Guard/Touching assist ?  ?  ?Locomotion ?Ambulation ? ? ?Ambulation assist ? ?   ? ?Assist level: Contact Guard/Touching assist ?Assistive  device: Walker-rolling ?Max distance: 132ft  ? ?Walk 10 feet activity ? ? ?Assist ?   ? ?Assist level: Contact Guard/Touching assist ?Assistive device: Walker-rolling  ? ?Walk 50 feet activity ? ? ?Assist Walk 50 feet with 2 turns activity did not occur: Safety/medical concerns ? ?Assist level: Contact Guard/Touching assist ?Assistive device: Walker-rolling  ? ? ?Walk 150 feet activity ? ? ?Assist Walk 150 feet activity did not occur: Safety/medical concerns ? ?Assist level: Contact Guard/Touching assist ?Assistive device: Walker-rolling ?  ? ?Walk 10 feet on uneven surface  ?activity ? ? ?Assist Walk 10 feet on uneven surfaces activity did not occur: Safety/medical concerns ? ? ?  ?   ? ?Wheelchair ? ? ? ? ?Assist Is the patient using a wheelchair?: Yes ?Type of Wheelchair: Manual ?  ? ?Wheelchair assist level: Supervision/Verbal cueing ?Max wheelchair distance: 150'  ? ? ?Wheelchair 50 feet with 2 turns activity ? ? ? ?Assist ? ?  ?  ? ? ?Assist Level: Supervision/Verbal cueing  ? ?Wheelchair 150 feet activity  ? ? ? ?Assist ?   ? ? ?Assist Level: Supervision/Verbal cueing  ? ?Blood pressure (!) 90/59, pulse 72, temperature 97.9 ?F (36.6 ?C), resp. rate 15, height 5\' 11"  (1.803 m), SpO2 93 %. ? ?Medical Problem List and Plan: ?1. Functional deficits secondary to acute minimally displaced fracture of right acromion with conservative care/weightbearing as tolerated.  Acute comminuted angulated intertrochanteric fracture of right hip with moderate surrounding intramuscular interstitial hemorrhage.  Status post open treatment with intramedullary implant and interlocking screws 05/15/2021.  Weightbearing as tolerated. ?            -patient may  shower if dressing covered ?            -ELOS/Goals: 10-14 days supervision ? Con't CIR_ PT and OT- team conference today to determine length of stay ?2.  Antithrombotics: ?-DVT/anticoagulation:  Pharmaceutical: Lovenox x6 weeks.  Check vascular study ?3/14- Dopplers were negative  on 3/8- will add SCDs for RLE edema and con't TED's- don't think he has DVT on BID lovenox- likely dependent edema.  ?            -antiplatelet therapy: Aspirin 81 mg daily and Plavix 75 mg daily ?3. Pain Management/chronic pain/peripheral polyneuropathy: Maintained on Suboxone 2-0.5 mg 1 tablet daily as prior to admission followed by Dr.[facility]HEAGWARA at the [facility] [phone] extension [phone], Voltaren gel as needed, Robaxin as needed, hydrocodone as needed ? 3/9- not taking Norco much- makes him confused/drugged feeling- will change to [facility] for now 15 mg- since had good response to IV morphine- won't use IV. Will monitoring behavior  closely, since on Suboxone and MSIR ? 3/10- pain worse this AM- will add lidocaine patches 8am to 8pm- likely due to weather, making it worse ? 3/14- pain worse again last night- likely due to cold weather- was 29 this AM.  ?4. Mood/PTSD with dementia: Cymbalta 60 mg daily and 30 mg nightly, Namenda 10 mg daily and 20 mg nightly,Razadyne 16 mg nightly ? 3/9- doesn't want to change depression meds- just needs to talk about where he's at- emotional support ?            -antipsychotic agents: N/A ?5. Neuropsych: This patient is capable of making decisions on his own behalf. ?6. Skin/Wound Care: Routine skin checks.  Keep Aquacel on and dry for up to 14 days. ?7. Fluids/Electrolytes/Nutrition: Routine in and outs with follow-up chemistries ?8.  Hypertension.  Monitor with increased mobility ?9.  OSA.  CPAP ?10.  Acute blood loss anemia.  S/p 2 units pRBCs. Follow-up CBC.  Continue iron supplement ?11.  Atrial fibrillation/pacemaker/watchman.  Cardiac rate controlled. ?12.  Pancreatic insufficiency.  Pancrelipase ?13. GERD_ just restarted Porotonix- having a lot of GERD Sx's- con't tums prn ? 3/8- reflux better this AM- con't regimen ?14. Azotemia ? 3/9- will recheck Monday- BUN up to 26 and Cr ok- likely dry- will push fluids ? 3/12- labs in AM ? 3/13-  BUN 26- and Cr -.66- will ask him ot dirnk more or will need IVFs.  ? 3/14- BUN 26 and Cr 0.74- is dry, but don't want to give IVFs right now per pt- will recheck Thursday.  ?15. Sinus/inner ear pain ?

## 2021-05-27 NOTE — Progress Notes (Signed)
Physical Therapy Session Note ? ?Patient Details  ?Name: Jack Neal ?MRN: [id number] ?Date of Birth: 1945-07-26 ? ?Today's Date: 05/27/2021 ?PT Individual Time: 1305-1405 ?PT Individual Time Calculation (min): 60 min  ? ?Short Term Goals: ?Week 1:  PT Short Term Goal 1 (Week 1): Pt will ambulate x 50 ft with LRAD and min A ?PT Short Term Goal 2 (Week 1): Pt will perform bed mobility with Supervision ?PT Short Term Goal 3 (Week 1): Pt will performing objective functional outcome measure ? ?Skilled Therapeutic Interventions/Progress Updates:  ?Pt was received sitting upright in recliner with BLE's elevated. He reports a 6/10 pain in his R shoulder which he says continues to be much more significant than pain in his RLE. Pt was agreeable to therapy and was educated at start of session on sitting in his recliner more often when in his room for the purpose of being able to elevate his LE's and minimize edema. Pt is CGA-supervision with all transfers due to intermittent spells of vertigo. Pt ambulated 35ft with RW CGA but had to discontinue due to dizziness. Pt was not sure if this was related to orthostatic HT or vertigo, so BP was assessed (128/79) before continuing with session. Pt was transported to and from therapy gym in W/C due to feeling unsafe to ambulate. Transferred to the NuStep with min A to assist R leg onto the platform and adjust seat. Pt pedaled with decreased pain at Level 4 for 16 minutes today and PT provided verbal cues to control ROM on the RLE as a way to not provoke as much pain. Pt performed LAQs x10 each leg with verbal cues for 5' isometric and controlling the eccentric phase. Alt seated marches were attempted but were d/c due to pain on the RLE. Pt transferred W/C to [facility] with CGA. He had all needs in reach and confirmed he would alert nursing when he was finished with toileting.  ? ?Pt also received education on increasing fluid intake per MD requests. ? ?Therapy Documentation ?Precautions:   ?Precautions ?Precautions: Fall ?Restrictions ?Weight Bearing Restrictions: Yes ?RUE Weight Bearing: Weight bearing as tolerated ?RLE Weight Bearing: Weight bearing as tolerated ?Other Position/Activity Restrictions: RUE shoulder </ 90' flexion and abduction ? ? ? ? ? ?Therapy/Group: Individual Therapy ? ?Charmian Muff ?05/27/2021, 2:07 PM  ?

## 2021-05-28 MED ORDER — GLYCERIN (LAXATIVE) 2 G RE SUPP
1.0000 | RECTAL | Status: DC | PRN
  Administered 2021-05-28 – 2021-06-03 (×5): 1 via RECTAL
  Filled 2021-05-28 (×7): qty 1

## 2021-05-28 NOTE — Progress Notes (Signed)
Physical Therapy Session Note ? ?Patient Details  ?Name: Jack Neal ?MRN: [id number] ?Date of Birth: Oct 02, 1945 ? ?Today's Date: 05/28/2021 ?PT Individual Time: 8416-6063 ?PT Individual Time Calculation (min): 55 min  ? ?Short Term Goals: ?Week 1:  PT Short Term Goal 1 (Week 1): Pt will ambulate x 50 ft with LRAD and min A ?PT Short Term Goal 1 - Progress (Week 1): Met ?PT Short Term Goal 2 (Week 1): Pt will perform bed mobility with Supervision ?PT Short Term Goal 2 - Progress (Week 1): Met ?PT Short Term Goal 3 (Week 1): Pt will performing objective functional outcome measure ?PT Short Term Goal 3 - Progress (Week 1): Met ? ?Skilled Therapeutic Interventions/Progress Updates:  ?   ?Patient in w/c upon PT arrival. Patient alert and agreeable to PT session. Patient reported 6/10 R shoulder pain and 4/10 R hip pain during session, RN made aware. PT provided repositioning, rest breaks, and distraction as pain interventions throughout session. Patient reports he is reducing his use of pain medications slowly and tolerated this well today. Patient requested for a block in his schedule for an online visit with his psychologist tomorrow from 10-11, communicated with scheduling and this was able to be accommodated in his schedule tomorrow.  ? ?Focused session on review of vestibular assessment yesterday and initiation of treatment. Discussed R horizontal canal BPPV and Gufoni vs BBQ roll maneuvers for treatment. Upon demonstration/description, patient reported he cannot yet tolerate lying on his R side to perform either of these maneuvers at this time. Opted to perform second assessment of anterior/posterior and horizontal canals to confirm yesterday's finding.  ? ?R Dix Hallpike: negative ?L Dix Hallpike: negative ?L horizontal canal: R beating nystagmus noted, reduced with correction of eye position out of end range ?R horizonatal canal: negative  ? ?Reproduced findings x3 with horizontal canal testing. Amending findings  to no longer include R horizontal BPPV. Nystagmus noted to be end range gaze induced nystagmus indicating additional oculomotor findings as opposed to BPPV. Able to recreate gaze induced nystagmus in sitting with R end range gaze.  ? ?Recommending intensive oculomotor interventions to include accomodation, gaze stabilization, and convergence/divergence exercises. Provided lead PT with exercise handout and targets to include the below exercise program. Progress from sitting, to standing, to walking while performing exercises to patient tolerance with symptoms of 4-5/10.  ? ?Access Code: [email or web address] ?Standing Gaze Stabilization with Head Rotation - 3 x daily - 7 x weekly - 1 sets - 3 reps - 2 min hold ?Standing Gaze Stabilization with Two Near Targets and Head Rotation - 3 x daily - 7 x weekly - 1 sets - 13 reps - 2 min hold ?Seated Proximal-Distal Smooth Pursuit - 3 x daily - 7 x weekly - 1 sets - 3 reps - 2 min hold ? ?Patient performed stand pivot bed>w/c and an ambulatory transfer to the bathroom with CGA using RW during session. Patient seated on the toilet with pull cord in reach at end of session, RN made aware and approved patient to be seated without supervision during toileting.  ? ?Therapy Documentation ?Precautions:  ?Precautions ?Precautions: Fall ?Restrictions ?Weight Bearing Restrictions: Yes ?RUE Weight Bearing: Weight bearing as tolerated ?RLE Weight Bearing: Weight bearing as tolerated ?Other Position/Activity Restrictions: RUE shoulder </ 90' flexion and abduction ? ? ?Therapy/Group: Individual Therapy ? ?Doreene Burke PT, DPT ? ?05/28/2021, 4:09 PM  ?

## 2021-05-28 NOTE — Progress Notes (Signed)
?                                                       PROGRESS NOTE ? ? ?Subjective/Complaints: ? ? ?Pt reports plans on weaning himself down on MSIR- ?Also swelling is better since has been elevating his RLE in bedside chair or in bed- as compared to letting it hang down in w/c.  ? ? ?ROS: ? ?Pt denies SOB, abd pain, CP, N/V/C/D, and vision changes ? ? ? ? ?Objective: ?  ?No results found. ?Recent Labs  ?  05/27/21 ?0533  ?WBC 3.8*  ?HGB 8.9*  ?HCT 28.1*  ?PLT 267  ? ? ?Recent Labs  ?  05/27/21 ?0533  ?NA 139  ?K 4.2  ?CL 97*  ?CO2 34*  ?GLUCOSE 87  ?BUN 26*  ?CREATININE 0.74  ?CALCIUM 8.9  ? ? ? ?Intake/Output Summary (Last 24 hours) at 05/28/2021 1052 ?Last data filed at 05/28/2021 0700 ?Gross per 24 hour  ?Intake 600 ml  ?Output 2100 ml  ?Net -1500 ml  ?  ? ?  ? ?Physical Exam: ?Vital Signs ?Blood pressure (!) 92/56, pulse 72, temperature 97.9 ?F (36.6 ?C), resp. rate 16, height 5\' 11"  (1.803 m), SpO2 95 %. ? ? ? ? ? ? ? ?General: awake, alert, appropriate, sitting up in bed- after walking with RW from toilet with NT; NAD ?HENT: conjugate gaze; oropharynx moist ?CV: regular rate; no JVD ?Pulmonary: CTA B/L; no W/R/R- good air movement ?GI: soft, NT, ND, (+)BS ?Psychiatric: appropriate ?Neurological: Ox3 ? ?Musculoskeletal: R hip and RLE swelling MUCH improved- almost 1/2 the size was yesterday ?   Cervical back: Neck supple. No tenderness.  ?   Comments: UE strength 5/5 B/L ?LLE- 5/5 in HF, KE, DF and PF ?RLE- 2/5 in proximal RLE; 5/5 distally ?TTP over R lateral hip and groin as well as R acromium - no change ?Skin: ?   Comments: Right hip incision is dressed. ? no erythema; original surgical dressing in place- in place still;  ?Large purple bruise inner upper R arm ?1-2+ LE edema to distal calves B/L ?Venous stasis changes distal 1/3 of calves B/L ?R forearm IV - looks OK - looks OK ?Neurological:  ?   Mental Status: He is alert and oriented to person, place, and time.  ?   Comments: Patient is alert.  Makes  eye contact with examiner.  Provides name and age.  Limited medical historian.  Follows commands. ?Decreased sensation to light touch in lower legs bowel knees B/L  ?Otherwise, intact above knees and in arms  ? ?Assessment/Plan: ?1. Functional deficits which require 3+ hours per day of interdisciplinary therapy in a comprehensive inpatient rehab setting. ?Physiatrist is providing close team supervision and 24 hour management of active medical problems listed below. ?Physiatrist and rehab team continue to assess barriers to discharge/monitor patient progress toward functional and medical goals ? ?Care Tool: ? ?Bathing ? Bathing activity did not occur: Refused ?Body parts bathed by patient: Right arm, Left arm, Chest, Abdomen, Front perineal area, Buttocks, Right upper leg, Left upper leg, Right lower leg, Left lower leg, Face  ?   ?  ?  ?Bathing assist Assist Level: Contact Guard/Touching assist ?  ?  ?Upper Body Dressing/Undressing ?Upper body dressing Upper body dressing/undressing activity did not occur (including orthotics): Refused ?  What is the patient wearing?: Pull over shirt ?   ?Upper body assist Assist Level: Supervision/Verbal cueing ?   ?Lower Body Dressing/Undressing ?Lower body dressing ? ? ?   ?What is the patient wearing?: Pants, Incontinence brief ? ?  ? ?Lower body assist Assist for lower body dressing: Minimal Assistance - Patient > 75% ?   ? ?Toileting ?Toileting Toileting Activity did not occur Press photographer(Clothing management and hygiene only): Refused  ?Toileting assist Assist for toileting: Minimal Assistance - Patient > 75% ?  ?  ?Transfers ?Chair/bed transfer ? ?Transfers assist ?   ? ?Chair/bed transfer assist level: Contact Guard/Touching assist ?  ?  ?Locomotion ?Ambulation ? ? ?Ambulation assist ? ?   ? ?Assist level: Contact Guard/Touching assist ?Assistive device: Walker-rolling ?Max distance: 30 ft  ? ?Walk 10 feet activity ? ? ?Assist ?   ? ?Assist level: Contact Guard/Touching assist ?Assistive  device: Walker-rolling  ? ?Walk 50 feet activity ? ? ?Assist Walk 50 feet with 2 turns activity did not occur: Safety/medical concerns ? ?Assist level: Contact Guard/Touching assist ?Assistive device: Walker-rolling  ? ? ?Walk 150 feet activity ? ? ?Assist Walk 150 feet activity did not occur: Safety/medical concerns ? ?Assist level: Contact Guard/Touching assist ?Assistive device: Walker-rolling ?  ? ?Walk 10 feet on uneven surface  ?activity ? ? ?Assist Walk 10 feet on uneven surfaces activity did not occur: Safety/medical concerns ? ? ?  ?   ? ?Wheelchair ? ? ? ? ?Assist Is the patient using a wheelchair?: Yes ?Type of Wheelchair: Manual ?  ? ?Wheelchair assist level: Supervision/Verbal cueing ?Max wheelchair distance: 150'  ? ? ?Wheelchair 50 feet with 2 turns activity ? ? ? ?Assist ? ?  ?  ? ? ?Assist Level: Supervision/Verbal cueing  ? ?Wheelchair 150 feet activity  ? ? ? ?Assist ?   ? ? ?Assist Level: Supervision/Verbal cueing  ? ?Blood pressure (!) 92/56, pulse 72, temperature 97.9 ?F (36.6 ?C), resp. rate 16, height 5\' 11"  (1.803 m), SpO2 95 %. ? ?Medical Problem List and Plan: ?1. Functional deficits secondary to acute minimally displaced fracture of right acromion with conservative care/weightbearing as tolerated.  Acute comminuted angulated intertrochanteric fracture of right hip with moderate surrounding intramuscular interstitial hemorrhage.  Status post open treatment with intramedullary implant and interlocking screws 05/15/2021.  Weightbearing as tolerated. ?            -patient may  shower if dressing covered ?            -ELOS/Goals: 10-14 days supervision ? D/c date  set for 3/21 ?Con't CIR- PT and OT ?2.  Antithrombotics: ?-DVT/anticoagulation:  Pharmaceutical: Lovenox x6 weeks.  Check vascular study ?3/14- Dopplers were negative on 3/8- will add SCDs for RLE edema and con't TED's- don't think he has DVT on BID lovenox- likely dependent edema.  ?            -antiplatelet therapy: Aspirin 81 mg  daily and Plavix 75 mg daily ?3. Pain Management/chronic pain/peripheral polyneuropathy: Maintained on Suboxone 2-0.5 mg 1 tablet daily as prior to admission [facility] [place] [phone], Voltaren gel as needed, Robaxin as needed, hydrocodone as needed ? 3/9- not taking Norco much- makes him confused/drugged feeling- [facility]- since had good response to IV morphine- won't use IV. Will monitoring behavior closely, since on Suboxone and MSIR ? 3/10- pain worse this AM- will add lidocaine patches 8am  to 8pm- likely due to weather, making it worse ? 3/14- pain worse again last night- likely due to cold weather- was 29 this AM.  ? 3/15- pt wants to wean MSIR- I recommended taking it less frequently, since at lowest dose ?4. Mood/PTSD with dementia: Cymbalta 60 mg daily and 30 mg nightly, Namenda 10 mg daily and 20 mg nightly,Razadyne 16 mg nightly ? 3/9- doesn't want to change depression meds- just needs to talk about where he's at- emotional support ?            -antipsychotic agents: N/A ?5. Neuropsych: This patient is capable of making decisions on his own behalf. ?6. Skin/Wound Care: Routine skin checks.  Keep Aquacel on and dry for up to 14 days. ?7. Fluids/Electrolytes/Nutrition: Routine in and outs with follow-up chemistries ?8.  Hypertension.  Monitor with increased mobility ?9.  OSA.  CPAP ?10.  Acute blood loss anemia.  S/p 2 units pRBCs. Follow-up CBC.  Continue iron supplement ?11.  Atrial fibrillation/pacemaker/watchman.  Cardiac rate controlled. ?12.  Pancreatic insufficiency.  Pancrelipase ?13. GERD_ just restarted Porotonix- having a lot of GERD Sx's- con't tums prn ? 3/8- reflux better this AM- con't regimen ?14. Azotemia ? 3/9- will recheck Monday- BUN up to 26 and Cr ok- likely dry- will push fluids ? 3/12- labs in AM ? 3/13- BUN 26- and Cr -.66- will ask him ot dirnk more or will need IVFs.  ? 3/14-  BUN 26 and Cr 0.74- is dry, but don't want to give IVFs right now per pt- will recheck Thursday.  ?15. Sinus/inner ear pain ? 3/13- will give sudafed daily prn for eustachian tube blockage.  ?16. RLE ed

## 2021-05-28 NOTE — Progress Notes (Signed)
Occupational Therapy Session Note ? ?Patient Details  ?Name: Jack Neal ?MRN: [id number] ?Date of Birth: 1946-03-04 ? ?Today's Date: 05/28/2021 ?OT Individual Time: 1050-1200 ?OT Individual Time Calculation (min): 70 min  ? ? ?Short Term Goals: ?Week 2:  OT Short Term Goal 1 (Week 2): STG=LTG 2/2 ELOS (continue working towards mod I LTGs) ? ?Skilled Therapeutic Interventions/Progress Updates:  ?  Pt received sitting in the recliner with high c/o pain in his R hip but reporting he is premedicated and has no further request for pain management. Extensive discussion re d/c planning, as well as answered questions re ALF placement following d/c. He completed toileting in standing with good safety awareness, using urinal with (S). Pt completed 400 ft of functional mobility using the RW with close (S) to the therapy gym on 4W, including entering/exiting elevator with several turns dynamically. He took an extended rest break seated. Engaged in discussion re mental health, mindfulness, and suicide prevention, with pt sharing that he is a part of a veteran group in which he made a suicide prevention plan. Pt completes kitchen search into various height cabinets/appliaces in prep for IADL retraining from (S) level with min cuing for safety/positioning throughout environment. Pt uses RW to improve reach/safety and transportation of items with education on energy conservation techniques throughout activity. Pt completed another 400 ft of functional mobility to his room with (S) overall using the RW. Pt returned to the w/c and was left sitting up with all needs met. Chair alarm set.  ? ? ?Therapy Documentation ?Precautions:  ?Precautions ?Precautions: Fall ?Restrictions ?Weight Bearing Restrictions: Yes ?RUE Weight Bearing: Weight bearing as tolerated ?RLE Weight Bearing: Weight bearing as tolerated ?Other Position/Activity Restrictions: RUE shoulder </ 90' flexion and abduction ? ?Therapy/Group: Individual Therapy ? ?Curtis Sites ?05/28/2021, 6:28 AM ?

## 2021-05-29 MED ORDER — SORBITOL 70 % SOLN
15.0000 mL | Freq: Once | Status: AC
  Administered 2021-05-29: 15 mL via ORAL
  Filled 2021-05-29: qty 30

## 2021-05-29 MED ORDER — DOCUSATE SODIUM 100 MG PO CAPS
100.0000 mg | ORAL_CAPSULE | Freq: Two times a day (BID) | ORAL | Status: DC
  Administered 2021-05-29 – 2021-06-03 (×11): 100 mg via ORAL
  Filled 2021-05-29 (×11): qty 1

## 2021-05-29 NOTE — Progress Notes (Signed)
Occupational Therapy Session Note ? ?Patient Details  ?Name: Jack Neal ?MRN: [id number] ?Date of Birth: 05/25/1945 ? ?Today's Date: 05/29/2021 ?OT Individual Time: 7106-2694 ?OT Individual Time Calculation (min): 59 min  ?Pt missed 15 mins eating breakfast ? ? ?Short Term Goals: ?Week 2:  OT Short Term Goal 1 (Week 2): STG=LTG 2/2 ELOS (continue working towards mod I LTGs) ? ?Skilled Therapeutic Interventions/Progress Updates:  ?  Pt resting in bed upon arrival eating breakfast. Pt requested OTA return in a few minutes to allow him to finish breakfast. Initial focus on donning Ted hose/shoes and donning pants. Pt required assistance donning RLE ted hose but completed all other LB dressing tasks with supervision. Pt propelled w/c to 4th floor ADL apartment/kitchen. Pt practiced several kitchen tasks at w/c level. Pt also reported that his wife located RW at home that has a basket on the front. Pt reported that he has two (2) bar stools/chairs he can sit on in kitchen. Pt propelled to gym. Pt amb with RW 184' with CGA/supervision. Pt returned to room and amb with RW from elevators to room. Pt returned to recliner. All needs within reach and belt alarm activated.  Pt missed 15 mins skilled OT services.  ? ?Therapy Documentation ?Precautions:  ?Precautions ?Precautions: Fall ?Restrictions ?Weight Bearing Restrictions: Yes ?RUE Weight Bearing: Weight bearing as tolerated ?RLE Weight Bearing: Weight bearing as tolerated ?Other Position/Activity Restrictions: RUE shoulder </ 90' flexion and abduction ?Pain: ? Pt c/o 5/10 Rt shoulder pain; premedicated ? ? ?Therapy/Group: Individual Therapy ? ?[name] ?05/29/2021, 9:24 AM ?

## 2021-05-29 NOTE — Progress Notes (Signed)
Physical Therapy Session Note ? ?Patient Details  ?Name: Jack Neal ?MRN: [id number] ?Date of Birth: 1945/12/19 ? ?Today's Date: 05/29/2021 ?PT Individual Time: 7482-7078 ?PT Individual Time Calculation (min): 25 min  ? ?Short Term Goals: ?Week 1:  PT Short Term Goal 1 (Week 1): Pt will ambulate x 50 ft with LRAD and min A ?PT Short Term Goal 1 - Progress (Week 1): Met ?PT Short Term Goal 2 (Week 1): Pt will perform bed mobility with Supervision ?PT Short Term Goal 2 - Progress (Week 1): Met ?PT Short Term Goal 3 (Week 1): Pt will performing objective functional outcome measure ?PT Short Term Goal 3 - Progress (Week 1): Met ? ?Skilled Therapeutic Interventions/Progress Updates:  ?   ?Patient on the toilet unclothed following bathing during OT session upon PT arrival. Patient alert and agreeable to PT session. Patient reported 6/10 R hip and shoulder pain during session, RN made aware. PT provided repositioning, rest breaks, and distraction as pain interventions throughout session.  ? ?Therapeutic Activity: ?Transfers: Patient performed sit to/from stand from the toilet and bed x3 with CGA-close supervision using RW. Provided verbal cues for attempted to place R foot back when sitting to increase knee/hip ROM with a functional activity. He performed ambulatory transfers toilet>bed>recliner with close supervision using RW. Patient was continent of bowl and bladder during toileting. Performed peri-care and donned incontinence brief and non-skid socks independently/set-up assist. He donned pants, a T-shirt, and button down long sleeve shirt with set-up assist without AD, except min A x1 for pulling up pants on L due to sudden onset of vertigo in standing. Patient reports increased episodes of vertigo today, "1-2 every hour," but reports they are very brief, <10 sec. Patient demonstrated ability to stand without use of upper extremity support during dressing. Required cues to sit while donning his shirts for safety.   ? ?Patient in the recliner with B lower extremities elevated at end of session with breaks locked, seat belt alarm set, and all needs within reach.  ? ?Therapy Documentation ?Precautions:  ?Precautions ?Precautions: Fall ?Restrictions ?Weight Bearing Restrictions: Yes ?RUE Weight Bearing: Weight bearing as tolerated ?RLE Weight Bearing: Weight bearing as tolerated ?Other Position/Activity Restrictions: RUE shoulder </ 90' flexion and abduction ? ? ? ?Therapy/Group: Individual Therapy ? ?Doreene Burke PT, DPT ? ?05/29/2021, 5:08 PM  ?

## 2021-05-29 NOTE — Progress Notes (Signed)
?                                                       PROGRESS NOTE ? ? ?Subjective/Complaints: ? ? ?Pt reports doing well this AM ?Shoulder pain doing bettertoday- was bad yesterday , but better this Am- also, weaning MSIR- took 3x in last 24 hours.  ?Bowels hard balls-  ?Required glycerin supp yesterday and still not cleaned out.  ?Trying prune juice today- will give low dose sorbitol after therapy.  ? ?ROS: ? ?Pt denies SOB, abd pain, CP, N/V/(+)C/D, and vision changes ? ? ? ? ? ?Objective: ?  ?No results found. ?Recent Labs  ?  05/27/21 ?0533  ?WBC 3.8*  ?HGB 8.9*  ?HCT 28.1*  ?PLT 267  ? ? ?Recent Labs  ?  05/27/21 ?0533  ?NA 139  ?K 4.2  ?CL 97*  ?CO2 34*  ?GLUCOSE 87  ?BUN 26*  ?CREATININE 0.74  ?CALCIUM 8.9  ? ? ? ?Intake/Output Summary (Last 24 hours) at 05/29/2021 0900 ?Last data filed at 05/29/2021 0720 ?Gross per 24 hour  ?Intake 240 ml  ?Output 2000 ml  ?Net -1760 ml  ?  ? ?  ? ?Physical Exam: ?Vital Signs ?Blood pressure 94/60, pulse 71, temperature 97.6 ?F (36.4 ?C), resp. rate 16, height 5\' 11"  (1.803 m), SpO2 94 %. ? ? ? ? ? ? ? ? ?General: awake, alert, appropriate,sitting up in w/c in kitchen with OTA;  NAD ?HENT: conjugate gaze; oropharynx moist ?CV: regular rate; no JVD ?Pulmonary: CTA B/L; no W/R/R- good air movement ?GI: soft, NT, ND, (+)BS- hypoactive ?Psychiatric: appropriate ?Neurological: Ox3 ? ?Musculoskeletal: R hip and RLE swelling MUCH improved- almost 1/2 the size was yesterday- still looks better ?   Cervical back: Neck supple. No tenderness.  ?   Comments: UE strength 5/5 B/L ?LLE- 5/5 in HF, KE, DF and PF ?RLE- 2/5 in proximal RLE; 5/5 distally ?TTP over R lateral hip and groin as well as R acromium - no change ?Skin: ?   Comments: Right hip incision is dressed. ? no erythema; original surgical dressing in place- in place still;  ?Large purple bruise inner upper R arm ?1-2+ LE edema to distal calves B/L ?Venous stasis changes distal 1/3 of calves B/L ?R forearm IV - looks OK -  looks OK ?Neurological:  ?   Mental Status: He is alert and oriented to person, place, and time.  ?   Comments: Patient is alert.  Makes eye contact with examiner.  Provides name and age.  Limited medical historian.  Follows commands. ?Decreased sensation to light touch in lower legs bowel knees B/L  ?Otherwise, intact above knees and in arms  ? ?Assessment/Plan: ?1. Functional deficits which require 3+ hours per day of interdisciplinary therapy in a comprehensive inpatient rehab setting. ?Physiatrist is providing close team supervision and 24 hour management of active medical problems listed below. ?Physiatrist and rehab team continue to assess barriers to discharge/monitor patient progress toward functional and medical goals ? ?Care Tool: ? ?Bathing ? Bathing activity did not occur: Refused ?Body parts bathed by patient: Right arm, Left arm, Chest, Abdomen, Front perineal area, Buttocks, Right upper leg, Left upper leg, Right lower leg, Left lower leg, Face  ?   ?  ?  ?Bathing assist Assist Level: Contact Guard/Touching assist ?  ?  ?  Upper Body Dressing/Undressing ?Upper body dressing Upper body dressing/undressing activity did not occur (including orthotics): Refused ?What is the patient wearing?: Pull over shirt ?   ?Upper body assist Assist Level: Supervision/Verbal cueing ?   ?Lower Body Dressing/Undressing ?Lower body dressing ? ? ?   ?What is the patient wearing?: Pants, Incontinence brief ? ?  ? ?Lower body assist Assist for lower body dressing: Minimal Assistance - Patient > 75% ?   ? ?Toileting ?Toileting Toileting Activity did not occur Landscape architect and hygiene only): Refused  ?Toileting assist Assist for toileting: Minimal Assistance - Patient > 75% ?  ?  ?Transfers ?Chair/bed transfer ? ?Transfers assist ?   ? ?Chair/bed transfer assist level: Contact Guard/Touching assist ?  ?  ?Locomotion ?Ambulation ? ? ?Ambulation assist ? ?   ? ?Assist level: Contact Guard/Touching assist ?Assistive  device: Walker-rolling ?Max distance: 177ft  ? ?Walk 10 feet activity ? ? ?Assist ?   ? ?Assist level: Contact Guard/Touching assist ?Assistive device: Walker-rolling  ? ?Walk 50 feet activity ? ? ?Assist Walk 50 feet with 2 turns activity did not occur: Safety/medical concerns ? ?Assist level: Contact Guard/Touching assist ?Assistive device: Walker-rolling  ? ? ?Walk 150 feet activity ? ? ?Assist Walk 150 feet activity did not occur: Safety/medical concerns ? ?Assist level: Contact Guard/Touching assist ?Assistive device: Walker-rolling ?  ? ?Walk 10 feet on uneven surface  ?activity ? ? ?Assist Walk 10 feet on uneven surfaces activity did not occur: Safety/medical concerns ? ? ?  ?   ? ?Wheelchair ? ? ? ? ?Assist Is the patient using a wheelchair?: Yes ?Type of Wheelchair: Manual ?  ? ?Wheelchair assist level: Supervision/Verbal cueing ?Max wheelchair distance: 150'  ? ? ?Wheelchair 50 feet with 2 turns activity ? ? ? ?Assist ? ?  ?  ? ? ?Assist Level: Supervision/Verbal cueing  ? ?Wheelchair 150 feet activity  ? ? ? ?Assist ?   ? ? ?Assist Level: Supervision/Verbal cueing  ? ?Blood pressure 94/60, pulse 71, temperature 97.6 ?F (36.4 ?C), resp. rate 16, height 5\' 11"  (1.803 m), SpO2 94 %. ? ?Medical Problem List and Plan: ?1. Functional deficits secondary to acute minimally displaced fracture of right acromion with conservative care/weightbearing as tolerated.  Acute comminuted angulated intertrochanteric fracture of right hip with moderate surrounding intramuscular interstitial hemorrhage.  Status post open treatment with intramedullary implant and interlocking screws 05/15/2021.  Weightbearing as tolerated. ?            -patient may  shower if dressing covered ?            -ELOS/Goals: 10-14 days supervision ? D/c date  set for 3/21 ?Con't CIR- PT and OT ?2.  Antithrombotics: ?-DVT/anticoagulation:  Pharmaceutical: Lovenox x6 weeks.  Check vascular study ?3/14- Dopplers were negative on 3/8- will add SCDs for RLE  edema and con't TED's- don't think he has DVT on BID lovenox- likely dependent edema.  ?            -antiplatelet therapy: Aspirin 81 mg daily and Plavix 75 mg daily ?3. Pain Management/chronic pain/peripheral polyneuropathy: Maintained on Suboxone 2-0.5 mg 1 tablet daily as prior to admission followed by Dr.IHEAGWARA at the [facility] [phone] extension [phone], Voltaren gel as needed, Robaxin as needed, hydrocodone as needed ? 3/9- not taking Norco much- makes him confused/drugged feeling- will change to [facility] for now 15 mg- since had good response to IV morphine- won't use IV. Will monitoring behavior closely, since on  Suboxone and MSIR ? 3/10- pain worse this AM- will add lidocaine patches 8am to 8pm- likely due to weather, making it worse ? 3/14- pain worse again last night- likely due to cold weather- was 29 this AM.  ? 3/15- pt wants to wean MSIR- I recommended taking it less frequently, since at lowest dose ? 3/16- only took MSIR 3x in last 24 hours- con't to wean as tolerated ?4. Mood/PTSD with dementia: Cymbalta 60 mg daily and 30 mg nightly, Namenda 10 mg daily and 20 mg nightly,Razadyne 16 mg nightly ? 3/9- doesn't want to change depression meds- just needs to talk about where he's at- emotional support ?            -antipsychotic agents: N/A ?5. Neuropsych: This patient is capable of making decisions on his own behalf. ?6. Skin/Wound Care: Routine skin checks.  Keep Aquacel on and dry for up to 14 days. ?7. Fluids/Electrolytes/Nutrition: Routine in and outs with follow-up chemistries ?8.  Hypertension.  Monitor with increased mobility ?9.  OSA.  CPAP ?10.  Acute blood loss anemia.  S/p 2 units pRBCs. Follow-up CBC.  Continue iron supplement ?11.  Atrial fibrillation/pacemaker/watchman.  Cardiac rate controlled. ?12.  Pancreatic insufficiency.  Pancrelipase ?13. GERD_ just restarted Porotonix- having a lot of GERD Sx's- con't tums prn ? 3/8- reflux better this AM-  con't regimen ?14. Azotemia ? 3/9- will recheck Monday- BUN up to 26 and Cr ok- likely dry- will push fluids ? 3/12- labs in AM ? 3/13- BUN 26- and Cr -.66- will ask him ot dirnk more or will need IVFs.  ? 3/16- w

## 2021-05-29 NOTE — Progress Notes (Signed)
Occupational Therapy Session Note ? ?Patient Details  ?Name: Jack Neal ?MRN: [id number] ?Date of Birth: 1945-10-05 ? ?Today's Date: 05/29/2021 ?OT Individual Time: 4098-1191 ?OT Individual Time Calculation (min): 33 min  ? ? ?Short Term Goals: ?Week 2:  OT Short Term Goal 1 (Week 2): STG=LTG 2/2 ELOS (continue working towards mod I LTGs) ? ?Skilled Therapeutic Interventions/Progress Updates:  ?  Pt sitting in w/c upon arrival, requested to wash and change clothing. Sit<>stand from w/c x 3 to remove pants for LB bathing tasks. Pt completed UB bahting tasks seated in w/c at sink. Pt washed BLE while seated in w/c and requested to use toilet. Pt propelled w/c to bathroom door and amb with RW into bathroom for transfer to [facility] over toilet-supervision. Pt requested glycerin suppository from RN. Pt remained seated on BSC, RN and NT notified. ? ?Therapy Documentation ?Precautions:  ?Precautions ?Precautions: Fall ?Restrictions ?Weight Bearing Restrictions: Yes ?RUE Weight Bearing: Weight bearing as tolerated ?RLE Weight Bearing: Weight bearing as tolerated ?Other Position/Activity Restrictions: RUE shoulder </ 90' flexion and abduction ?Pain: ? Pt reports 7/10 RLE/RUE pain; pain meds requested from RN Ed. ? ? ?Therapy/Group: Individual Therapy ? ?[name] ?05/29/2021, 2:52 PM ?

## 2021-05-29 NOTE — Evaluation (Signed)
Recreational Therapy Assessment and Plan ? ?Patient Details  ?Name: Jack Neal ?MRN: [id number] ?Date of Birth: October 20, 1945 ?Today's Date: 05/29/2021 ? ?Rehab Potential:  Good ?ELOS:   d/c 3/21 ? ?Assessment ? ?Hospital Problem: Principal Problem: ?  Intertrochanteric fracture of right hip ([place]) ?  ?  ?Past Medical History:  ?    ?Past Medical History:  ?Diagnosis Date  ? AF (paroxysmal atrial fibrillation) ([place]) 10/01/2020  ? Atrial fibrillation ([place])    ? Celiac disease 10/01/2020  ? Chronic pain syndrome 10/01/2020  ? Depression    ? GERD (gastroesophageal reflux disease)    ? 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 Hypotension    ? Major depressive disorder 10/01/2020  ? Neuropathy    ? OSA on CPAP 10/01/2020  ? Pacemaker    ? Peripheral polyneuropathy 10/01/2020  ? Presence of cardiac pacemaker 10/01/2020  ? 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  Watchman FLX 20  ? PTSD (post-traumatic stress disorder) 10/01/2020  ?  ?Past Surgical History:  ?     ?Past Surgical History:  ?Procedure Laterality Date  ? INTRAMEDULLARY (IM) NAIL INTERTROCHANTERIC Right 05/15/2021  ?  Procedure: INTRAMEDULLARY (IM) NAIL INTERTROCHANTRIC;  Surgeon: Georgeanna Harrison, MD;  Location: [facility];  Service: Orthopedics;  Laterality: Right;  ? LEFT ATRIAL APPENDAGE OCCLUSION N/A 01/02/2021  ?  Procedure: LEFT ATRIAL APPENDAGE OCCLUSION;  Surgeon: Vickie Epley, MD;  Location: [place];  Service: Cardiovascular;  Laterality: N/A;  ? TEE WITHOUT CARDIOVERSION N/A 01/02/2021  ?  Procedure: TRANSESOPHAGEAL ECHOCARDIOGRAM (TEE);  Surgeon: Vickie Epley, MD;  Location: [facility];  Service: Cardiovascular;  Laterality: N/A;  ? TEE WITHOUT CARDIOVERSION N/A 02/17/2021  ?  Procedure: TRANSESOPHAGEAL ECHOCARDIOGRAM (TEE);  Surgeon: Geralynn Rile, MD;  Location: [facility];  Service: Cardiovascular;  Laterality: N/A;  ?  ?  ?Assessment & Plan ?Clinical Impression:  ?Jack Neal is a  76 year old right-handed/ambidextrous male with history of PAF/pacemaker maintained on aspirin and Plavix, presence of watchman left atrial appendage closure device placed October 2022, PTSD, mild dementia maintained on Namenda as well as Razadyne, chronic pain syndrome/peripheral polyneuropathy maintained on Suboxone followed by Dr.IHEAGWARA at the [facility] [phone] extension [phone], OSA on CPAP, hypertension, tobacco use.  Per chart review patient lives with spouse.  1 level home.  Wife can provide very little physical assistance.  Patient was independent with a rollator prior to admission.  He does not drive.  Presented 05/14/2021 after mechanical fall without loss of consciousness landing on his right side.  Cranial CT scan negative.  CT cervical spine negative.  CT chest abdomen pelvis showed acute minimally displaced fracture of the right acromion.  Acute comminuted angulated intertrochanteric fracture of the right hip with moderate surrounding intramuscular and interstitial hemorrhage.  Patient underwent open treatment of right intertrochanteric femur fracture with intramedullary implant with interlocking screws 05/15/2021 per Dr. Mable Fill.  Patient is weightbearing as tolerated.  Conservative care of right acromion fracture limited abduction and forward flexion to 90 degrees due to fracture.  Placed on Lovenox for DVT prophylaxis.  He remains on aspirin and Plavix as prior to admission.  Acute blood loss anemia hemoglobin 8.2.  Transfused 1 unit packed red blood cells 3/4.  Therapy evaluations completed due to patient decreased functional mobility was admitted for a comprehensive rehab program. Patient transferred to CIR on 05/20/2021 .  ?  ?Pt presents with decreased activity tolerance, decreased functional mobility, decreased balance Limiting  pt's independence with leisure/community pursuits. ? ?Met with pt today to discuss TR services including leisure education, activity  analysis/modifications and stress management.  Also discussed the importance of social, emotional, spiritual health in addition to physical health and their effects on overall health and wellness.  Pt stated understanding and previous connections with community resources through the [place].  ? ?Plan ? No further TR at this time. ? ?Recommendations for other services: None  ? ?Discharge Criteria: Patient will be discharged from TR if patient refuses treatment 3 consecutive times without medical reason.  If treatment goals not met, if there is a change in medical status, if patient makes no progress towards goals or if patient is discharged from hospital. ? ?The above assessment, treatment plan, treatment alternatives and goals were discussed and mutually agreed upon: by patient ? ?Noura Purpura ?05/29/2021, 4:24 PM  ?

## 2021-05-29 NOTE — Progress Notes (Signed)
Physical Therapy Session Note ? ?Patient Details  ?Name: Jack Neal ?MRN: [id number] ?Date of Birth: 14-Nov-1945 ? ?Today's Date: 05/29/2021 ?PT Individual Time: 6701-4103 ?PT Individual Time Calculation (min): 50 min  ? ?Short Term Goals: ?Week 2:  PT Short Term Goal 1 (Week 2): = LTG d/t ELOS ? ?Skilled Therapeutic Interventions/Progress Updates: Pt presented in recliner agreeable to therapy. Pt does not rate pain but states has improved from yesterday with UE> LE. Pt indicated has an appt with pyschologist at 10am agreeable to terminate session at 955p so that pt can set up and prepare (zoom call). Pt then ambulated from room to [facility] rehab gym with RW and overall CGA nearing close S. Pt noted to have improved heel strike, knee flexion, and weight shift then when last seen by this therapist. At rehab gym pt participated in several bouts of horseshoes with RLE forward (step forward) and with emphasis on reaching forward and towards R to encourage weight shifting and increased weight bearing on RLE. Pt also performed toe taps 2 x10 with pt able to tolerate full knee extension on RLE when placing LLE on step. Pt even performed several reps briefly taking BUE off RW with no significant increase in pain. After seated rest pt ambulated back to room in same manner as prior and returned to recliner. Once seated and settled in recliner PTA instructed and demonstrated vestibular exercises provided by vestibular therapist (Standing Gaze Stabilization, Gaze stabilization with 2 targets and head rotation, and Proximal-distal smooth pursuit). Pt was able to tolerate approx 30sec with each activity before increase of symptoms of 5/10). Pt was able to demonstrate each activity appropriately after demonstration. Pt left in recliner at end of session with belt alarm on, call bell within reach and needs met.  ?   ? ?Therapy Documentation ?Precautions:  ?Precautions ?Precautions: Fall ?Restrictions ?Weight Bearing Restrictions: Yes ?RUE  Weight Bearing: Weight bearing as tolerated ?RLE Weight Bearing: Weight bearing as tolerated ?Other Position/Activity Restrictions: RUE shoulder </ 90' flexion and abduction ?General: ?PT Amount of Missed Time (min): 10 Minutes ?PT Missed Treatment Reason: Other (Comment) ?Vital Signs: ?Therapy Vitals ?Temp: (!) 97.5 ?F (36.4 ?C) ?Temp Source: Oral ?Pulse Rate: 85 ?Resp: 18 ?BP: 107/69 ?Patient Position (if appropriate): Sitting ?Oxygen Therapy ?SpO2: 100 % ?O2 Device: Room Air ?Pain: ?  ?Mobility: ?  ?Locomotion : ?   ?Trunk/Postural Assessment : ?   ?Balance: ?  ?Exercises: ?  ?Other Treatments:   ? ? ? ?Therapy/Group: Individual Therapy ? ?[name] ?05/29/2021, 4:39 PM  ?

## 2021-05-30 LAB — BASIC METABOLIC PANEL
Anion gap: 10 (ref 5–15)
BUN: 17 mg/dL (ref 8–23)
CO2: 32 mmol/L (ref 22–32)
Calcium: 9 mg/dL (ref 8.9–10.3)
Chloride: 99 mmol/L (ref 98–111)
Creatinine, Ser: 0.59 mg/dL — ABNORMAL LOW (ref 0.61–1.24)
GFR, Estimated: >60 mL/min (ref >60)
Glucose, Bld: 89 mg/dL (ref 70–99)
Potassium: 4.2 mmol/L (ref 3.5–5.1)
Sodium: 141 mmol/L (ref 135–145)

## 2021-05-30 NOTE — Progress Notes (Signed)
Occupational Therapy Session Note ? ?Patient Details  ?Name: Jack Neal ?MRN: [id number] ?Date of Birth: November 25, 1945 ? ?Today's Date: 05/30/2021 ?OT Individual Time: 1122-1207 ?OT Individual Time Calculation (min): 45 min  ? ? ?Short Term Goals: ?Week 2:  OT Short Term Goal 1 (Week 2): STG=LTG 2/2 ELOS (continue working towards mod I LTGs) ? ?Skilled Therapeutic Interventions/Progress Updates:  ?  Pt received in hand off from PT for family education with pt and his wife.   ?Pt received in hallway on main rehab unit so used tub room to practice tub transfers.  He already has a tub bench at home. His wife said he has a very high tub wall so added yoga blocks to simulate a higher wall.  Pt ambulated from hallway with RW to tub bench and was able to sit down and bring both legs over high wall with Supervision only.  He did not even need the A of the gait belt as a leg lifter.  He has 5 grab bars at home so pt can stand to wash his bottom. Transferred out with supervision and walked out to w/c.  Reviewed safety precautions and adding nonslip tub grips to floor of tub, sitting down to doff and don socks and shoes, pants, etc.  Also suggested they add an adhesive shower handle holder so he can reach the shower hose more easily.  ? ?Pt rested briefly in wc and then ambulated back to his room (over 400 ft) with S.  Asked wife to walk next to pt so she could practice. His wife was a bit nervous and said she wasn't ready to try it.  Tried to emphasize that he has good balance and is quite secure with the Rw.  Reminded her to not try to stop him from falling but to just stay close to provide stabilizing support if needed.    ? ?Pt returned to room. He opted to sit in recliner. Transferred to recliner without A and even set up his safety belt himself.   ? ?Set up his tub bench in his bathroom so it is ready to go for a future shower.   ? ?Pt and wife in room with all needs met.  Answered all questions and family ed completed.   ? ?Therapy Documentation ?Precautions:  ?Precautions ?Precautions: Fall ?Restrictions ?Weight Bearing Restrictions: Yes ?RUE Weight Bearing: Weight bearing as tolerated ?RLE Weight Bearing: Weight bearing as tolerated ?Other Position/Activity Restrictions: RUE shoulder </ 90' flexion and abduction ?  ?Pain: no c/o pain  ?  ? ? ? ?Therapy/Group: Individual Therapy ? ?[name] ?05/30/2021, 8:33 AM ?

## 2021-05-30 NOTE — Progress Notes (Signed)
Physical Therapy Session Note ? ?Patient Details  ?Name: Jack Neal ?MRN: [id number] ?Date of Birth: 1946-02-02 ? ?Today's Date: 05/30/2021 ?PT Individual Time: 5726-2035 and 5974-1638 ?PT Individual Time Calculation (min): 47 min and 70 min ? ?Short Term Goals: ?Week 2:  PT Short Term Goal 1 (Week 2): = LTG d/t ELOS ? ?Skilled Therapeutic Interventions/Progress Updates: Pt presented at toilet with NT present agreeable to therapy. Pt states pain 4/10 recently premedicated and has required less pain meds. Pt did indicate that his vertigo has increased today but also states that he's more clogged as well. Rest breaks provided throughout session for pain and symptom management. Pt performed transfer from toilet with supervision and clothing management with close supervision. Pt then ambulated to bed with supervision as nsg arrived for application of lidocaine patches. Once applied pt ambulated to [facility] gym with supervision. Pt then participated in creation of HEP and performed each activity x 10 bilaterally with the exception of hamstring stretch performed 30sec x 3 and  SLR which was performed in second session. Pt ambulated back to room in same manner as prior and transferred to recliner all performed at supervision level. Pt left in recliner at end of session with bed alarm on, call bell within reach and needs met.  ? ? ?Access Code: [email or web address] ?URL: [email or web address] ?Date: 05/30/2021 ?Prepared by: Karlyne Greenspan Afrah Burlison ? ?Exercises ?Seated Long Arc Quad - 1 x daily - 7 x weekly - 3 sets - 10 reps ?Standing Hip Abduction with Counter Support - 1 x daily - 7 x weekly - 3 sets - 10 reps ?Standing Hamstring Curl with Chair Support - 1 x daily - 7 x weekly - 3 sets - 10 reps ?Standing March with Counter Support - 1 x daily - 7 x weekly - 3 sets - 10 reps ?Mini Squat with Counter Support - 1 x daily - 7 x weekly - 3 sets - 10 reps ?Seated Hamstring Stretch - 1 x daily - 7 x weekly - 3 sets - 10 reps ?Small Range  Straight Leg Raise - 1 x daily - 7 x weekly - 3 sets - 10 reps ? ?Tx2: ?Pt presented in recliner handoff from Neuropsych. Pt states pain 4/10 and vertigo has improved. Pt's wife entered room as session to focus on family education. PTA explained pt's current functional status and anticipation at d/c (near supervision level now and anticipate supervision at d/c). Wife with some concerns regarding endurance and safety with PTA advising that pt will continue to have therapy and progress upon d/c. Pt then performed transfer and ambulated from room to 4th fl ortho gym with only rest break in elevator. Pt then performed car transfer with supervision and use of gait belt as leg lifter to place RLE in simulator. Pt then transferred out of simulator and propelled w/c to day room with supervision for endurance. In day room pt participated in obstacle course including weaving through cones and stepping over thresholds. Pt was able to complete all tasks at supervision. Pt then participated in static stand on Airex decreasing use of BUE support until able to tolerate standing without on Airex. PTA then performed gentle permutations to pt all planes to increase ankle strategy which pt was able to maintain with CGA. Pt then participated in x 2 bouts of cornhole, first bout on level surface with RLE stepped forward to increase weight shift to RLE while performing dynamic balance. Pt then ambulated and retirved bags with use of reacher with supervision. Pt then  performed second bout while standing on airex requiring CGA however no LOB noted. Pt then transferred back w/c and propelled to elevators. Pt then handed off to [place], [place] for next session with current needs met.  ? ?   ? ?Therapy Documentation ?Precautions:  ?Precautions ?Precautions: Fall ?Restrictions ?Weight Bearing Restrictions: Yes ?RUE Weight Bearing: Weight bearing as tolerated ?RLE Weight Bearing: Weight bearing as tolerated ?Other Position/Activity Restrictions: RUE  shoulder </ 90' flexion and abduction ?General: ?  ?Vital Signs: ?  ?Pain: ?  ?Mobility: ?  ?Locomotion : ?   ?Trunk/Postural Assessment : ?   ?Balance: ?  ?Exercises: ?  ?Other Treatments:   ? ? ? ?Therapy/Group: Individual Therapy ? ?Karrine Kluttz ?05/30/2021, 4:05 PM  ?

## 2021-05-30 NOTE — Progress Notes (Signed)
Occupational Therapy Session Note ? ?Patient Details  ?Name: Jack Neal ?MRN: [id number] ?Date of Birth: 20-Nov-1945 ? ?Today's Date: 05/30/2021 ?OT Individual Time: [id number] ?OT Individual Time Calculation (min): 44 min  ? ? ?Short Term Goals: ?Week 2:  OT Short Term Goal 1 (Week 2): STG=LTG 2/2 ELOS (continue working towards mod I LTGs) ? ?Skilled Therapeutic Interventions/Progress Updates:  ?  Pt resting in bed upon arrival and agreeable to therapy. OT intervention with focus on dressing, sit<>stand, standing balance, and safety awareness. Supine>sit EOB with supervision. Pt completed dressing tasks with sit<>stand from EOB. Pt required assistance with donning Ted hose on RLE but completed all other LB dressing tasks with CGA. Pt reports increased vertigo this morning with ALL transitional movements. Pt also reports increased time to resolve. Standing balance with CGA this morning. Stand pivot transfer with CGA this morning. Pt remained seated in recliner with belt alarm activated and all needs within reach. ? ?Therapy Documentation ?Precautions:  ?Precautions ?Precautions: Fall ?Restrictions ?Weight Bearing Restrictions: Yes ?RUE Weight Bearing: Weight bearing as tolerated ?RLE Weight Bearing: Weight bearing as tolerated ?Other Position/Activity Restrictions: RUE shoulder </ 90' flexion and abduction ?General: ?  ?Vital Signs: ?Therapy Vitals ?Temp: 97.7 ?F (36.5 ?C) ?Temp Source: Oral ?Pulse Rate: 72 ?Resp: 18 ?BP: 105/61 ?Patient Position (if appropriate): Lying ?Oxygen Therapy ?SpO2: 96 % ?O2 Device: CPAP ?Pain: ?  ?ADL: ?ADL ?Eating: Independent ?Where Assessed-Eating: Chair ?Grooming: Setup ?Where Assessed-Grooming: Sitting at sink ?Upper Body Bathing: Not assessed ?Lower Body Bathing: Not assessed ?Upper Body Dressing: Minimal assistance ?Where Assessed-Upper Body Dressing: Edge of bed ?Lower Body Dressing: Maximal assistance ?Where Assessed-Lower Body Dressing: Edge of bed ?Toileting: Not assessed ?Toilet  Transfer Method: Not assessed ?Tub/Shower Transfer: Not assessed ?ADL Comments: Declined bathing and toileting at time of initial evaluation. ?Vision ?  ?Perception  ?  ?Praxis ?  ?Balance ?  ?Exercises: ?  ?Other Treatments:   ? ? ?Therapy/Group: Individual Therapy ? ?[name] ?05/30/2021, 7:47 AM ?

## 2021-05-30 NOTE — Progress Notes (Signed)
Pt states that he is familiar with the CPAP unit and will place himself on the unit. He was advised to have his nurse contact the RT with any questions or concerns.  ?

## 2021-05-30 NOTE — Progress Notes (Signed)
?                                                       PROGRESS NOTE ? ? ?Subjective/Complaints: ? ? ?Pt reports 2 BM's yesterday after sorbitol.  ?Having more vertigo this AM, but also reports having eustachian tubes blocked and needs to take Sudafed.  ? ?Lasting up to 1 minute at a time- BP fine.  ?Slept well- had bladder accident last night since so sleepy.  ?No MSIR in 12 hours- pain 5-6/10 in shouder and hip- tolerable. Needs pain meds now though because a lot of therapy today.  ? ?ROS: ? ? ?Pt denies SOB, abd pain, CP, N/V/C/D, and vision changes ? ? ? ? ? ? ?Objective: ?  ?No results found. ?No results for input(s): WBC, HGB, HCT, PLT in the last 72 hours. ? ? ?Recent Labs  ?  05/30/21 ?3016  ?NA 141  ?K 4.2  ?CL 99  ?CO2 32  ?GLUCOSE 89  ?BUN 17  ?CREATININE 0.59*  ?CALCIUM 9.0  ? ? ? ?Intake/Output Summary (Last 24 hours) at 05/30/2021 1302 ?Last data filed at 05/30/2021 0700 ?Gross per 24 hour  ?Intake 358 ml  ?Output 850 ml  ?Net -492 ml  ?  ? ?  ? ?Physical Exam: ?Vital Signs ?Blood pressure 105/61, pulse 72, temperature 97.7 ?F (36.5 ?C), temperature source Oral, resp. rate 18, height 5\' 11"  (1.803 m), SpO2 96 %. ? ? ? ? ? ? ? ? ? ?General: awake, alert, appropriate, sitting up in bedside chair; c/o vertigo; OTA in room; NAD ?HENT: conjugate gaze; oropharynx moist ?CV: regular rate; no JVD ?Pulmonary: CTA B/L; no W/R/R- good air movement ?GI: soft, NT, ND, (+)BS ?Psychiatric: appropriate- talkative'interactive ?Neurological: Ox3 ? ?Musculoskeletal: R hip and RLE swelling MUCH improved- almost 1/2 the size was yesterday- still looks better ?   Cervical back: Neck supple. No tenderness.  ?   Comments: UE strength 5/5 B/L ?LLE- 5/5 in HF, KE, DF and PF ?RLE- 2/5 in proximal RLE; 5/5 distally ?TTP over R lateral hip and groin as well as R acromium - no change ?Skin: ?   Comments: Right hip incision is dressed. ? no erythema; original surgical dressing in place- in place still;  ?Large purple bruise inner  upper R arm ?1-2+ LE edema to distal calves B/L ?Venous stasis changes distal 1/3 of calves B/L ?R forearm IV - looks OK - looks OK ?Neurological:  ?   Mental Status: He is alert and oriented to person, place, and time.  ?   Comments: Patient is alert.  Makes eye contact with examiner.  Provides name and age.  Limited medical historian.  Follows commands. ?Decreased sensation to light touch in lower legs bowel knees B/L  ?Otherwise, intact above knees and in arms  ? ?Assessment/Plan: ?1. Functional deficits which require 3+ hours per day of interdisciplinary therapy in a comprehensive inpatient rehab setting. ?Physiatrist is providing close team supervision and 24 hour management of active medical problems listed below. ?Physiatrist and rehab team continue to assess barriers to discharge/monitor patient progress toward functional and medical goals ? ?Care Tool: ? ?Bathing ? Bathing activity did not occur: Refused ?Body parts bathed by patient: Right arm, Left arm, Chest, Abdomen, Front perineal area, Buttocks, Right upper leg, Left upper leg, Right lower leg,  Left lower leg, Face  ?   ?  ?  ?Bathing assist Assist Level: Contact Guard/Touching assist ?  ?  ?Upper Body Dressing/Undressing ?Upper body dressing Upper body dressing/undressing activity did not occur (including orthotics): Refused ?What is the patient wearing?: Button up shirt ?   ?Upper body assist Assist Level: Independent ?   ?Lower Body Dressing/Undressing ?Lower body dressing ? ? ?   ?What is the patient wearing?: Pants, Incontinence brief ? ?  ? ?Lower body assist Assist for lower body dressing: Contact Guard/Touching assist ?   ? ?Toileting ?Toileting Toileting Activity did not occur Press photographer(Clothing management and hygiene only): Refused  ?Toileting assist Assist for toileting: Minimal Assistance - Patient > 75% ?  ?  ?Transfers ?Chair/bed transfer ? ?Transfers assist ?   ? ?Chair/bed transfer assist level: Contact Guard/Touching assist ?  ?   ?Locomotion ?Ambulation ? ? ?Ambulation assist ? ?   ? ?Assist level: Contact Guard/Touching assist ?Assistive device: Walker-rolling ?Max distance: 13450ft  ? ?Walk 10 feet activity ? ? ?Assist ?   ? ?Assist level: Contact Guard/Touching assist ?Assistive device: Walker-rolling  ? ?Walk 50 feet activity ? ? ?Assist Walk 50 feet with 2 turns activity did not occur: Safety/medical concerns ? ?Assist level: Contact Guard/Touching assist ?Assistive device: Walker-rolling  ? ? ?Walk 150 feet activity ? ? ?Assist Walk 150 feet activity did not occur: Safety/medical concerns ? ?Assist level: Contact Guard/Touching assist ?Assistive device: Walker-rolling ?  ? ?Walk 10 feet on uneven surface  ?activity ? ? ?Assist Walk 10 feet on uneven surfaces activity did not occur: Safety/medical concerns ? ? ?  ?   ? ?Wheelchair ? ? ? ? ?Assist Is the patient using a wheelchair?: Yes ?Type of Wheelchair: Manual ?  ? ?Wheelchair assist level: Supervision/Verbal cueing ?Max wheelchair distance: 150'  ? ? ?Wheelchair 50 feet with 2 turns activity ? ? ? ?Assist ? ?  ?  ? ? ?Assist Level: Supervision/Verbal cueing  ? ?Wheelchair 150 feet activity  ? ? ? ?Assist ?   ? ? ?Assist Level: Supervision/Verbal cueing  ? ?Blood pressure 105/61, pulse 72, temperature 97.7 ?F (36.5 ?C), temperature source Oral, resp. rate 18, height 5\' 11"  (1.803 m), SpO2 96 %. ? ?Medical Problem List and Plan: ?1. Functional deficits secondary to acute minimally displaced fracture of right acromion with conservative care/weightbearing as tolerated.  Acute comminuted angulated intertrochanteric fracture of right hip with moderate surrounding intramuscular interstitial hemorrhage.  Status post open treatment with intramedullary implant and interlocking screws 05/15/2021.  Weightbearing as tolerated. ?            -patient may  shower if dressing covered ?            -ELOS/Goals: 10-14 days supervision ? D/c date  set for 3/21 ?Con't CIR- PT and OT- worse vertigo likely  due to eustachian tube dysfunction- asked pt to ask for sudafed- told nurse ?2.  Antithrombotics: ?-DVT/anticoagulation:  Pharmaceutical: Lovenox x6 weeks.  Check vascular study ?3/14- Dopplers were negative on 3/8- will add SCDs for RLE edema and con't TED's- don't think he has DVT on BID lovenox- likely dependent edema.  ?            -antiplatelet therapy: Aspirin 81 mg daily and Plavix 75 mg daily ?3. Pain Management/chronic pain/peripheral polyneuropathy: Maintained on Suboxone 2-0.5 mg 1 tablet daily as prior to admission followed [facility]ARA [facility] [place] [phone] extension [phone], Voltaren gel as needed, Robaxin as  needed, hydrocodone as needed ? 3/9- not taking Norco much- makes him confused/drugged feeling- will change to [facility] for now 15 mg- since had good response to IV morphine- won't use IV. Will monitoring behavior closely, since on Suboxone and MSIR ? 3/10- pain worse this AM- will add lidocaine patches 8am to 8pm- likely due to weather, making it worse ? 3/14- pain worse again last night- likely due to cold weather- was 29 this AM.  ? 3/15- pt wants to wean MSIR- I recommended taking it less frequently, since at lowest dose ? 3/16- only took MSIR 3x in last 24 hours- con't to wean as tolerated ? 3/17- reducing MSIR Rx- 2x/ in last 24 hours- continue to wean as tolerated ?4. Mood/PTSD with dementia: Cymbalta 60 mg daily and 30 mg nightly, Namenda 10 mg daily and 20 mg nightly,Razadyne 16 mg nightly ? 3/9- doesn't want to change depression meds- just needs to talk about where he's at- emotional support ?            -antipsychotic agents: N/A ?5. Neuropsych: This patient is capable of making decisions on his own behalf. ?6. Skin/Wound Care: Routine skin checks.  Keep Aquacel on and dry for up to 14 days. ?7. Fluids/Electrolytes/Nutrition: Routine in and outs with follow-up chemistries ?8.  Hypertension.  Monitor with increased mobility ?9.  OSA.  CPAP ?10.   Acute blood loss anemia.  S/p 2 units pRBCs. Follow-up CBC.  Continue iron supplement ?11.  Atrial fibrillation/pacemaker/watchman.  Cardiac rate controlled. ?12.  Pancreatic insufficiency.  Pancrelipase ?13. GERD_ just

## 2021-05-31 DIAGNOSIS — F4312 Post-traumatic stress disorder, chronic: Secondary | ICD-10-CM

## 2021-05-31 DIAGNOSIS — G3184 Mild cognitive impairment, so stated: Secondary | ICD-10-CM

## 2021-05-31 DIAGNOSIS — F4001 Agoraphobia with panic disorder: Secondary | ICD-10-CM

## 2021-05-31 MED ORDER — MEMANTINE HCL 10 MG PO TABS
20.0000 mg | ORAL_TABLET | Freq: Every day | ORAL | Status: DC
Start: 2021-05-31 — End: 2021-06-03
  Administered 2021-05-31 – 2021-06-02 (×3): 20 mg via ORAL
  Filled 2021-05-31 (×4): qty 2

## 2021-05-31 MED ORDER — MEMANTINE HCL 10 MG PO TABS
10.0000 mg | ORAL_TABLET | Freq: Every day | ORAL | Status: DC
Start: 2021-05-31 — End: 2021-06-03
  Administered 2021-05-31 – 2021-06-03 (×4): 10 mg via ORAL
  Filled 2021-05-31 (×3): qty 1

## 2021-05-31 NOTE — Consult Note (Signed)
Neuropsychological Consultation ? ? ?Patient:   Jack Neal  ? ?DOB:   [birth year] ? ?MR Number:  [id number] ? ?Location:  [facility] [address] [id number] [facility] ?[place] [address] ?Dept: [phone] ?LocSR:[id number] ?          ?Date of Service:   05/30/2021 ? ?Start Time:   9 AM ?End Time:   10 AM ? ?Provider/Observer:  Ilean Skill, Psy.D.   ?    Clinical Neuropsychologist ?     ? ?Billing Code/Service: [id number] ? ?Chief Complaint:    Jack Neal is a 76 year old ambidextrous male with a past medical history including PAF/pacemaker maintained on aspirin and Plavix, significant long-term PTSD with history of dissociative experiences and significant panic events and acute stress response.  Patient developed significant PTSD through service related experiences during [place] War.  Patient has been diagnosed with "mild dementia" in the past but I was not able to find records as he has been primarily cared for in the [facility] in [place].  Patient has been maintained on Namenda as well as Razadyne.  Patient has a chronic pain syndrome with peripheral neuropathy as well.  Obstructive sleep apnea on CPAP along with hypertension and ongoing tobacco use.  Patient was independent prior to hospitalization but utilizes rollator prior to admission.  Patient presented on 05/14/2021 after mechanical fall without loss of consciousness landing on his right side.  The patient had been having numerous falls previously with significant gait change and reported to me during clinical interview that he felt like this was a common fall for him but this particular event resulted in his fractured hip.  Patient with fracture of right hip and underwent open treatment of right femur fracture.  Patient is weightbearing as tolerated. ? ?Reason for Service:  Patient referred for neuropsychological consultation due to extended hospital stay following hip  fracture and a previous history of significant gait change and diagnosis of mild dementia although likely mild cognitive impairment more appropriate diagnosis.  Patient had gastric BiPAP in the past as well as use of CPAP and suffers from chronic pain and has a significant history for severe panic events, depression and severe chronic PTSD from service related traumatic experiences during [place] War.  Below is the HPI for the current admission. ? ?HPI: Jack Neal is a 76 year old right-handed/ambidextrous male with history of PAF/pacemaker maintained on aspirin and Plavix, presence of watchman left atrial appendage closure device placed October 2022, PTSD, mild dementia maintained on Namenda as well as Razadyne, chronic pain syndrome/peripheral polyneuropathy maintained on Suboxone followed by Dr.IHEAGWARA at the [facility] [phone] extension [phone], OSA on CPAP, hypertension, tobacco use.  Per chart review patient lives with spouse.  1 level home.  Wife can provide very little physical assistance.  Patient was independent with a rollator prior to admission.  He does not drive.  Presented 05/14/2021 after mechanical fall without loss of consciousness landing on his right side.  Cranial CT scan negative.  CT cervical spine negative.  CT chest abdomen pelvis showed acute minimally displaced fracture of the right acromion.  Acute comminuted angulated intertrochanteric fracture of the right hip with moderate surrounding intramuscular and interstitial hemorrhage.  Patient underwent open treatment of right intertrochanteric femur fracture with intramedullary implant with interlocking screws 05/15/2021 per Dr. Mable Fill.  Patient is weightbearing as tolerated.  Conservative care of right acromion fracture limited abduction and forward flexion to 90 degrees due to fracture.  Placed on Lovenox for DVT prophylaxis.  He remains on aspirin and Plavix as prior to admission.  Acute blood loss anemia hemoglobin  8.2.  Transfused 1 unit packed red blood cells 3/4.  Therapy evaluations completed due to patient decreased functional mobility was admitted for a comprehensive rehab program. ? ?Current Status:  Patient was sitting in bedside chair in the upright position and was awake and oriented.  Patient appeared to be a relatively good historian and was able to describe numerous past and recent events and there did not appear to be any significant memory deficits noted.  Patient described symptoms consistent with long-term chronic severe PTSD, panic events and significant anxiety and depression.  Patient acknowledged recent history of physical reactions towards family members that have had a significant impact on family dynamics.  While formal testing was not conducted I found no indications during clinical interview that the patient would need a criterion for dementia as there did not appear to be required multiple areas of significant cognitive deficits impacting life.  There did not appear to be significant memory deficits although memory was not formally assessed.  There were no indications of receptive or expressive language deficits and formal assessment of visual spatial and visual constructional skills was not conducted during the clinical interview.  The patient's most pressing neuropsychological issues during rehab will be issues related to his significant chronic PTSD and panic events.  While patient has history of dissociative disorder he reported there is not been an exacerbation of the symptoms during his hospitalization. ? ?Reliability of Information: There were limits to formal records review as most of his care [facility] through the [facility]. ? ?Behavioral Observation: Jack Neal  presents as a 76 y.o.-year-old Ambidextrous Caucasian Male who appeared his stated age. his dress was Appropriate and he was Well Groomed and his manners were Appropriate to the situation.  his participation  was indicative of Appropriate and Attentive behaviors.  There were physical disabilities noted.  he displayed an appropriate level of cooperation and motivation.   ? ? ?Interactions:    Active Appropriate and Attentive ? ?Attention:   within normal limits and attention span and concentration were age appropriate ? ?Memory:   within normal limits; recent and remote memory intact ? ?Visuo-spatial:  not examined ? ?Speech (Volume):  normal ? ?Speech:   normal; normal ? ?Thought Process:  Coherent and Relevant ? ?Though Content:  WNL; not suicidal and not homicidal ? ?Orientation:   person, place, time/date, and situation ? ?Judgment:   Fair ? ?Planning:   Fair ? ?Affect:    Appropriate ? ?Mood:    Anxious ? ?Insight:   Fair ? ?Intelligence:   normal ? ?Medical History:   ?Past Medical History:  ?Diagnosis Date  ? AF (paroxysmal atrial fibrillation) ([place]) 10/01/2020  ? Atrial fibrillation ([place])   ? Celiac disease 10/01/2020  ? Chronic pain syndrome 10/01/2020  ? Depression   ? GERD (gastroesophageal reflux disease)   ? 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 Hypotension   ? Major depressive disorder 10/01/2020  ? Neuropathy   ? OSA on CPAP 10/01/2020  ? Pacemaker   ? Peripheral polyneuropathy 10/01/2020  ? Presence of cardiac pacemaker 10/01/2020  ? 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 Watchman FLX 20  ? PTSD (post-traumatic stress disorder) 10/01/2020  ? ? ?     ?Patient Active Problem List  ? Diagnosis Date Noted  ? Chronic posttraumatic stress disorder   ?  Intertrochanteric fracture of right hip ([place]) 05/20/2021  ? Thrombocytopenia ([place]) 05/16/2021  ? Hyponatremia 05/16/2021  ? Abnormal thyroid function test 05/16/2021  ? Dysuria 05/16/2021  ? Malnutrition of moderate degree 05/16/2021  ? Closed right hip fracture ([place]) 05/15/2021  ? Opioid dependence ([place]) 05/15/2021  ? Dementia without behavioral disturbance ([place]) 05/15/2021  ? Pancreatic insufficiency 05/15/2021  ? Closed  fracture of acromion 05/15/2021  ? Macrocytic anemia 05/15/2021  ? Constipation 05/15/2021  ? Atrial fibrillation ([place]) 01/02/2021  ? 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 Pe

## 2021-05-31 NOTE — Progress Notes (Signed)
Occupational Therapy Session Note ? ?Patient Details  ?Name: Jack Neal ?MRN: [id number] ?Date of Birth: 22-Jun-1945 ? ?Today's Date: 05/31/2021 ?OT Individual Time: 1000-1100 ?OT Individual Time Calculation (min): 60 min  ? ? ?Short Term Goals: ?Week 1:  OT Short Term Goal 1 (Week 1): Patient will complete LB bathing with Min A and use of AE/DME. ?OT Short Term Goal 1 - Progress (Week 1): Met ?OT Short Term Goal 2 (Week 1): Patient will don UB clothing with set-up assist. ?OT Short Term Goal 2 - Progress (Week 1): Met ?OT Short Term Goal 3 (Week 1): Patient will complete 3/3 parts of toileting task with Min A and LRAD. ?OT Short Term Goal 3 - Progress (Week 1): Met ?OT Short Term Goal 4 (Week 1): Patient will complete toilet transfer with Min A and LRAD. ?OT Short Term Goal 4 - Progress (Week 1): Met ?Week 4:    ? ?Skilled Therapeutic Interventions/Progress Updates:  ?  OT session focused on ADL retraining, activity tolerance, and functional transfers. Pt received in bed agreeable to tub shower in preparation of d/c. Pt ambulated in room to complete toileting and retrieve clothing items with supervision using RW. OT assisted pt to ADL apartment via w/c where pt completed bathing in tub shower with supervision for bathing, tub transfer, and dressing. Pt returned to room, maneuvering w/c independently and completing grooming tasks at sink. At end of session, pt left sitting in w/c with all needs in reach.  ? ?Therapy Documentation ?Precautions:  ?Precautions ?Precautions: Fall ?Restrictions ?Weight Bearing Restrictions: Yes ?RUE Weight Bearing: Weight bearing as tolerated ?RLE Weight Bearing: Weight bearing as tolerated ?Other Position/Activity Restrictions: RUE shoulder </ 90' flexion and abduction ?General: ?  ?Vital Signs: ?  ?Pain: ?  ?ADL: ?ADL ?Eating: Independent ?Where Assessed-Eating: Chair ?Grooming: Setup ?Where Assessed-Grooming: Sitting at sink ?Upper Body Bathing: Not assessed ?Lower Body Bathing: Not  assessed ?Upper Body Dressing: Minimal assistance ?Where Assessed-Upper Body Dressing: Edge of bed ?Lower Body Dressing: Maximal assistance ?Where Assessed-Lower Body Dressing: Edge of bed ?Toileting: Not assessed ?Toilet Transfer Method: Not assessed ?Tub/Shower Transfer: Not assessed ?ADL Comments: Declined bathing and toileting at time of initial evaluation. ?Vision ?  ?Perception  ?  ?Praxis ?  ?Balance ?  ?Exercises: ?  ?Other Treatments:   ? ? ?Therapy/Group: Individual Therapy ? ?[name], [place] ?05/31/2021, 11:01 AM ?

## 2021-05-31 NOTE — Plan of Care (Signed)
?  Problem: Consults ?Goal: RH GENERAL PATIENT EDUCATION ?Description: See Patient Education module for education specifics. ?Outcome: Progressing ?Goal: Skin Care Protocol Initiated - if Braden Score 18 or less ?Description: If consults are not indicated, leave blank or document N/A ?Outcome: Progressing ?  ?Problem: RH SKIN INTEGRITY ?Goal: RH STG MAINTAIN SKIN INTEGRITY WITH ASSISTANCE ?Description: STG Maintain Skin Integrity With Supervision Assistance. ?Outcome: Progressing ?Goal: RH STG ABLE TO PERFORM INCISION/WOUND CARE W/ASSISTANCE ?Description: STG Able To Perform Incision/Wound Care With Supervision Assistance. ?Outcome: Progressing ?  ?Problem: RH SAFETY ?Goal: RH STG ADHERE TO SAFETY PRECAUTIONS W/ASSISTANCE/DEVICE ?Description: STG Adhere to Safety Precautions With Assistance/Device. ?Outcome: Progressing ?Goal: RH STG DECREASED RISK OF FALL WITH ASSISTANCE ?Description: STG Decreased Risk of Fall With Supervision Assistance. ?Outcome: Progressing ?  ?Problem: RH PAIN MANAGEMENT ?Goal: RH STG PAIN MANAGED AT OR BELOW PT'S PAIN GOAL ?Description: < 3 on a 0-10 pain scale. ?Outcome: Progressing ?  ?Problem: RH KNOWLEDGE DEFICIT GENERAL ?Goal: RH STG INCREASE KNOWLEDGE OF SELF CARE AFTER HOSPITALIZATION ?Description: Patient will demonstrate knowledge of self-care, pain management, skin/wound care, and weight bearing precautions with educational materials and handouts provided by staff independently at discharge. ?Outcome: Progressing ?  ?

## 2021-06-01 NOTE — Progress Notes (Signed)
Physical Therapy Session Note ? ?Patient Details  ?Name: Jack Neal ?MRN: [id number] ?Date of Birth: 1945-08-17 ? ?Today's Date: 06/01/2021 ?PT Individual Time: 1027-2536 ?PT Individual Time Calculation (min): 42 min  ? ?Short Term Goals: ?Week 1:  PT Short Term Goal 1 (Week 1): Pt will ambulate x 50 ft with LRAD and min A ?PT Short Term Goal 1 - Progress (Week 1): Met ?PT Short Term Goal 2 (Week 1): Pt will perform bed mobility with Supervision ?PT Short Term Goal 2 - Progress (Week 1): Met ?PT Short Term Goal 3 (Week 1): Pt will performing objective functional outcome measure ?PT Short Term Goal 3 - Progress (Week 1): Met ?Week 2:  PT Short Term Goal 1 (Week 2): = LTG d/t ELOS ? ?Skilled Therapeutic Interventions/Progress Updates:  ? Received pt semi-reclined in bed, pt agreeable to PT treatment, and reported pain 5/10 along kidney distribution and in R shoulder (premedicated) - repositioning, rest breaks, and activity modification done to reduce pain levels. Session with emphasis on functional mobility, dressing, generalized strengthening and endurance, dynamic standing balance/coordination, and gait training. Pt requested to work on bed mobility and stretches this morning; therefore flattened bed, raised height, and removed bedrails to simulate pt's bed at home and pt transferred supine<>long sitting with supervision and donned personal knee high compression socks with supervision. Pt transferred long sitting<>sitting EOB with supervision and donned pants and shoes sitting EOB with supervision using shoe horn. Sit<>stand with RW and supervision to pull pants over hips and pt ambulated 115f x 2 trials with RW and supervision to/from room. Pt performed the following stretches with supervision and verbal cues for technique: ?-seated trunk flexion 3x15 seconds - mild dizziness that resolved with rest ?-seated hamstring stretch 2x30 seconds bilaterally ?-gentle spinal twist 2x15 seconds  ?Pt reported feeling much  better after exercises and very appreciative. Concluded session with pt sitting in WC, needs within reach, and seatbelt alarm on. Pt left at sink to brush teeth/comb hair.  ? ?Therapy Documentation ?Precautions:  ?Precautions ?Precautions: Fall ?Restrictions ?Weight Bearing Restrictions: Yes ?RUE Weight Bearing: Weight bearing as tolerated ?RLE Weight Bearing: Weight bearing as tolerated ?Other Position/Activity Restrictions: RUE shoulder </ 90' flexion and abduction ? ?Therapy/Group: Individual Therapy ?ABlenda Nicely?ABecky SaxPT, DPT  ?06/01/2021, 7:33 AM  ?

## 2021-06-01 NOTE — Plan of Care (Signed)
?  Problem: Consults ?Goal: RH GENERAL PATIENT EDUCATION ?Description: See Patient Education module for education specifics. ?Outcome: Progressing ?Goal: Skin Care Protocol Initiated - if Braden Score 18 or less ?Description: If consults are not indicated, leave blank or document N/A ?Outcome: Progressing ?  ?Problem: RH SKIN INTEGRITY ?Goal: RH STG MAINTAIN SKIN INTEGRITY WITH ASSISTANCE ?Description: STG Maintain Skin Integrity With Supervision Assistance. ?Outcome: Progressing ?Goal: RH STG ABLE TO PERFORM INCISION/WOUND CARE W/ASSISTANCE ?Description: STG Able To Perform Incision/Wound Care With Supervision Assistance. ?Outcome: Progressing ?  ?Problem: RH SAFETY ?Goal: RH STG ADHERE TO SAFETY PRECAUTIONS W/ASSISTANCE/DEVICE ?Description: STG Adhere to Safety Precautions With Assistance/Device. ?Outcome: Progressing ?Goal: RH STG DECREASED RISK OF FALL WITH ASSISTANCE ?Description: STG Decreased Risk of Fall With Supervision Assistance. ?Outcome: Progressing ?  ?Problem: RH PAIN MANAGEMENT ?Goal: RH STG PAIN MANAGED AT OR BELOW PT'S PAIN GOAL ?Description: < 3 on a 0-10 pain scale. ?Outcome: Progressing ?  ?Problem: RH KNOWLEDGE DEFICIT GENERAL ?Goal: RH STG INCREASE KNOWLEDGE OF SELF CARE AFTER HOSPITALIZATION ?Description: Patient will demonstrate knowledge of self-care, pain management, skin/wound care, and weight bearing precautions with educational materials and handouts provided by staff independently at discharge. ?Outcome: Progressing ?  ?

## 2021-06-01 NOTE — Progress Notes (Signed)
?                                                       PROGRESS NOTE ? ? ?Subjective/Complaints: ? ? ?Pt reports went 12-18 hours yesterday without MSIR- asking if can go home with a "few pills"- to help when pain gets real bad.  ? ?Shoulder pain >R hip pain.  ?Surgery was 3/2-  ?Michela Pitcher was due to get staples/dressing off Monday ? ?ROS: ? ?Pt denies SOB, abd pain, CP, N/V/C/D, and vision changes ? ? ? ? ? ?Objective: ?  ?No results found. ?No results for input(s): WBC, HGB, HCT, PLT in the last 72 hours. ? ? ?Recent Labs  ?  05/30/21 ?[id number]  ?NA 141  ?K 4.2  ?CL 99  ?CO2 32  ?GLUCOSE 89  ?BUN 17  ?CREATININE 0.59*  ?CALCIUM 9.0  ? ? ? ?Intake/Output Summary (Last 24 hours) at 06/01/2021 0932 ?Last data filed at 06/01/2021 0800 ?Gross per 24 hour  ?Intake 702 ml  ?Output 1675 ml  ?Net -973 ml  ?  ? ?  ? ?Physical Exam: ?Vital Signs ?Blood pressure 106/75, pulse 75, temperature 98 ?F (36.7 ?C), temperature source Oral, resp. rate 18, height 5\' 11"  (1.803 m), SpO2 92 %. ? ? ? ? ? ? ? ? ? ? ?General: awake, alert, appropriate, sitting up in bed; NAD ?HENT: conjugate gaze; oropharynx moist ?CV: regular rate; no JVD ?Pulmonary: CTA B/L; no W/R/R- good air movement ?GI: soft, NT, ND, (+)BS ?Psychiatric: appropriate- talkative ?Neurological: Ox3 ? ?Musculoskeletal: R hip and RLE swelling MUCH improved- almost 1/2 the size was yesterday- still looks better- stable with less swelling- TTP R shoulder ?   Cervical back: Neck supple. No tenderness.  ?   Comments: UE strength 5/5 B/L ?LLE- 5/5 in HF, KE, DF and PF ?RLE- 2/5 in proximal RLE; 5/5 distally ?TTP over R lateral hip and groin as well as R acromium - no change ?Skin: ?   Comments: Right hip incision is dressed. No change- less swelling ? no erythema; original surgical dressing in place- in place still;  ?Large purple bruise inner upper R arm ?1-2+ LE edema to distal calves B/L ?Venous stasis changes distal 1/3 of calves B/L ?R forearm IV - looks OK - looks  OK ?Neurological:  ?   Mental Status: He is alert and oriented to person, place, and time.  ?   Comments: Patient is alert.  Makes eye contact with examiner.  Provides name and age.  Limited medical historian.  Follows commands. ?Decreased sensation to light touch in lower legs bowel knees B/L  ?Otherwise, intact above knees and in arms  ? ?Assessment/Plan: ?1. Functional deficits which require 3+ hours per day of interdisciplinary therapy in a comprehensive inpatient rehab setting. ?Physiatrist is providing close team supervision and 24 hour management of active medical problems listed below. ?Physiatrist and rehab team continue to assess barriers to discharge/monitor patient progress toward functional and medical goals ? ?Care Tool: ? ?Bathing ? Bathing activity did not occur: Refused ?Body parts bathed by patient: Right arm, Face, Left arm, Left lower leg, Chest, Abdomen, Front perineal area, Buttocks, Right upper leg, Left upper leg, Right lower leg  ?   ?  ?  ?Bathing assist Assist Level: Supervision/Verbal cueing ?  ?  ?Upper Body Dressing/Undressing ?  Upper body dressing Upper body dressing/undressing activity did not occur (including orthotics): Refused ?What is the patient wearing?: Pull over shirt ?   ?Upper body assist Assist Level: Supervision/Verbal cueing ?   ?Lower Body Dressing/Undressing ?Lower body dressing ? ? ?   ?What is the patient wearing?: Underwear/pull up, Pants ? ?  ? ?Lower body assist Assist for lower body dressing: Supervision/Verbal cueing ?   ? ?Toileting ?Toileting Toileting Activity did not occur Landscape architect and hygiene only): Refused  ?Toileting assist Assist for toileting: Supervision/Verbal cueing ?  ?  ?Transfers ?Chair/bed transfer ? ?Transfers assist ?   ? ?Chair/bed transfer assist level: Supervision/Verbal cueing ?  ?  ?Locomotion ?Ambulation ? ? ?Ambulation assist ? ?   ? ?Assist level: Contact Guard/Touching assist ?Assistive device: Walker-rolling ?Max distance:  145ft  ? ?Walk 10 feet activity ? ? ?Assist ?   ? ?Assist level: Contact Guard/Touching assist ?Assistive device: Walker-rolling  ? ?Walk 50 feet activity ? ? ?Assist Walk 50 feet with 2 turns activity did not occur: Safety/medical concerns ? ?Assist level: Contact Guard/Touching assist ?Assistive device: Walker-rolling  ? ? ?Walk 150 feet activity ? ? ?Assist Walk 150 feet activity did not occur: Safety/medical concerns ? ?Assist level: Contact Guard/Touching assist ?Assistive device: Walker-rolling ?  ? ?Walk 10 feet on uneven surface  ?activity ? ? ?Assist Walk 10 feet on uneven surfaces activity did not occur: Safety/medical concerns ? ? ?  ?   ? ?Wheelchair ? ? ? ? ?Assist Is the patient using a wheelchair?: Yes ?Type of Wheelchair: Manual ?  ? ?Wheelchair assist level: Supervision/Verbal cueing ?Max wheelchair distance: 150'  ? ? ?Wheelchair 50 feet with 2 turns activity ? ? ? ?Assist ? ?  ?  ? ? ?Assist Level: Supervision/Verbal cueing  ? ?Wheelchair 150 feet activity  ? ? ? ?Assist ?   ? ? ?Assist Level: Supervision/Verbal cueing  ? ?Blood pressure 106/75, pulse 75, temperature 98 ?F (36.7 ?C), temperature source Oral, resp. rate 18, height 5\' 11"  (1.803 m), SpO2 92 %. ? ?Medical Problem List and Plan: ?1. Functional deficits secondary to acute minimally displaced fracture of right acromion with conservative care/weightbearing as tolerated.  Acute comminuted angulated intertrochanteric fracture of right hip with moderate surrounding intramuscular interstitial hemorrhage.  Status post open treatment with intramedullary implant and interlocking screws 05/15/2021.  Weightbearing as tolerated. ?            -patient may  shower if dressing covered ?            -ELOS/Goals: 10-14 days supervision ? D/c date  set for 3/21 ?Con't CIR- PT and OT- d/c Tuesday- can go home with 10 MSIR pills- since on Suboxone, doesn't need Suboxone Rx- which we cannot give-to remove Staples and surgical dressing Monday AM ?2.   Antithrombotics: ?-DVT/anticoagulation:  Pharmaceutical: Lovenox x6 weeks.  Check vascular study ?3/14- Dopplers were negative on 3/8- will add SCDs for RLE edema and con't TED's- don't think he has DVT on BID lovenox- likely dependent edema. ?3/19- swelling much improved  ?            -antiplatelet therapy: Aspirin 81 mg daily and Plavix 75 mg daily ?3. Pain Management/chronic pain/peripheral polyneuropathy: Maintained on Suboxone 2-0.5 mg 1 tablet daily as prior to admission followed by [facility] [phone] extension [phone], Voltaren gel as needed, Robaxin as needed, hydrocodone as needed ? 3/9- not taking Norco much- makes him confused/drugged feeling- will change  to [facility] for now 15 mg- since had good response to IV morphine- won't use IV. Will monitoring behavior closely, since on Suboxone and MSIR ? 3/10- pain worse this AM- will add lidocaine patches 8am to 8pm- likely due to weather, making it worse ? 3/14- pain worse again last night- likely due to cold weather- was 29 this AM.  ? 3/15- pt wants to wean MSIR- I recommended taking it less frequently, since at lowest dose ? 3/16- only took MSIR 3x in last 24 hours- con't to wean as tolerated ? 3/17- reducing MSIR Rx- 2x/ in last 24 hours- continue to wean as tolerated ? 3/19- can go home with MSIR 10 pills- only taking ~ 2x/day right now- I'm fine with this ?4. Mood/PTSD with dementia: Cymbalta 60 mg daily and 30 mg nightly, Namenda 10 mg daily and 20 mg nightly,Razadyne 16 mg nightly ? 3/9- doesn't want to change depression meds- just needs to talk about where he's at- emotional support ?            -antipsychotic agents: N/A ?5. Neuropsych: This patient is capable of making decisions on his own behalf. ?6. Skin/Wound Care: Routine skin checks.  Keep Aquacel on and dry for up to 14 days. ? 3/19- will remove dressing/sutures tomorrow ?7. Fluids/Electrolytes/Nutrition: Routine in and outs with follow-up  chemistries ?8.  Hypertension.  Monitor with increased mobility ?9.  OSA.  CPAP ?10.  Acute blood loss anemia.  S/p 2 units pRBCs. Follow-up CBC.  Continue iron supplement ?11.  Atrial fibrillation/pacemaker/watchman.  Cardiac

## 2021-06-01 NOTE — Discharge Summary (Signed)
Physician Discharge Summary  ?Patient ID: ?Jack Neal ?MRN: [id number] ?DOB/AGE: 04/14/1945 76 y.o. ? ?Admit date: 05/20/2021 ?Discharge date: 06/03/2021 ? ?Discharge Diagnoses:  ?Principal Problem: ?  Intertrochanteric fracture of right hip ([place]) ?Active Problems: ?  Chronic posttraumatic stress disorder ?  Mild cognitive impairment ?  Panic disorder with agoraphobia and moderate panic attacks ?DVT prophylaxis ?Hypertension ?Acute blood loss anemia ?OSA ?Atrial fibrillation status post pacemaker ?Pancreatic insufficiency ?GERD ?Chronic pain/peripheral polyneuropathy ?Tobacco use ? ?Discharged Condition: Stable ? ?Significant Diagnostic Studies: ?DG Shoulder Right ? ?Result Date: 05/14/2021 ?CLINICAL DATA:  fall EXAM: RIGHT SHOULDER - 2+ VIEW COMPARISON:  Chest x-ray 01/02/2021 FINDINGS: Acute minimally displaced fracture of the acromion. No other acute fracture identified. No shoulder dislocation. There is no evidence of arthropathy or other focal bone abnormality. Soft tissues are unremarkable. IMPRESSION: Acute minimally displaced fracture of the acromion. Electronically Signed   By: Iven Finn M.D.   On: 05/14/2021 23:23  ? ?CT HEAD WO CONTRAST (5MM) ? ?Result Date: 05/14/2021 ?CLINICAL DATA:  Polytrauma, blunt; Head trauma, minor (Age >= 65y) EXAM: CT HEAD WITHOUT CONTRAST CT CERVICAL SPINE WITHOUT CONTRAST TECHNIQUE: Multidetector CT imaging of the head and cervical spine was performed following the standard protocol without intravenous contrast. Multiplanar CT image reconstructions of the cervical spine were also generated. RADIATION DOSE REDUCTION: This exam was performed according to the departmental dose-optimization program which includes automated exposure control, adjustment of the mA and/or kV according to patient size and/or use of iterative reconstruction technique. COMPARISON:  None. FINDINGS: CT HEAD FINDINGS BRAIN: BRAIN Cerebral ventricle sizes are concordant with the degree of cerebral volume loss.  Patchy and confluent areas of decreased attenuation are noted throughout the deep and periventricular white matter of the cerebral hemispheres bilaterally, compatible with chronic microvascular ischemic disease. No evidence of large-territorial acute infarction. No parenchymal hemorrhage. No mass lesion. No extra-axial collection. No mass effect or midline shift. No hydrocephalus. Basilar cisterns are patent. Vascular: No hyperdense vessel. Atherosclerotic calcifications are present within the cavernous internal carotid arteries. Skull: No acute fracture or focal lesion. Sinuses/Orbits: Paranasal sinuses and mastoid air cells are clear. Bilateral lens replacement. Otherwise the orbits are unremarkable. Other: None. CT CERVICAL SPINE FINDINGS Alignment: Grade anterolisthesis C2 on C3. Skull base and vertebrae: Diffusely decreased bone density. Multilevel severe degenerative changes of the spine with C4-C5 vertebral body fusion. No acute fracture. No aggressive appearing focal osseous lesion or focal pathologic process. Soft tissues and spinal canal: No prevertebral fluid or swelling. No visible canal hematoma. Upper chest: Unremarkable. Other: None. IMPRESSION: 1. No acute intracranial abnormality. 2. No acute displaced fracture or traumatic listhesis of the cervical spine. 3. Diffusely decreased bone density. Electronically Signed   By: Iven Finn M.D.   On: 05/14/2021 23:32  ? ?CT Cervical Spine Wo Contrast ? ?Result Date: 05/14/2021 ?CLINICAL DATA:  Polytrauma, blunt; Head trauma, minor (Age >= 65y) EXAM: CT HEAD WITHOUT CONTRAST CT CERVICAL SPINE WITHOUT CONTRAST TECHNIQUE: Multidetector CT imaging of the head and cervical spine was performed following the standard protocol without intravenous contrast. Multiplanar CT image reconstructions of the cervical spine were also generated. RADIATION DOSE REDUCTION: This exam was performed according to the departmental dose-optimization program which includes automated  exposure control, adjustment of the mA and/or kV according to patient size and/or use of iterative reconstruction technique. COMPARISON:  None. FINDINGS: CT HEAD FINDINGS BRAIN: BRAIN Cerebral ventricle sizes are concordant with the degree of cerebral volume loss. Patchy and confluent areas of decreased attenuation are noted  throughout the deep and periventricular white matter of the cerebral hemispheres bilaterally, compatible with chronic microvascular ischemic disease. No evidence of large-territorial acute infarction. No parenchymal hemorrhage. No mass lesion. No extra-axial collection. No mass effect or midline shift. No hydrocephalus. Basilar cisterns are patent. Vascular: No hyperdense vessel. Atherosclerotic calcifications are present within the cavernous internal carotid arteries. Skull: No acute fracture or focal lesion. Sinuses/Orbits: Paranasal sinuses and mastoid air cells are clear. Bilateral lens replacement. Otherwise the orbits are unremarkable. Other: None. CT CERVICAL SPINE FINDINGS Alignment: Grade anterolisthesis C2 on C3. Skull base and vertebrae: Diffusely decreased bone density. Multilevel severe degenerative changes of the spine with C4-C5 vertebral body fusion. No acute fracture. No aggressive appearing focal osseous lesion or focal pathologic process. Soft tissues and spinal canal: No prevertebral fluid or swelling. No visible canal hematoma. Upper chest: Unremarkable. Other: None. IMPRESSION: 1. No acute intracranial abnormality. 2. No acute displaced fracture or traumatic listhesis of the cervical spine. 3. Diffusely decreased bone density. Electronically Signed   By: Iven Finn M.D.   On: 05/14/2021 23:32  ? ?DG Pelvis Portable ? ?Result Date: 05/14/2021 ?CLINICAL DATA:  trauma EXAM: PORTABLE PELVIS 1-2 VIEWS COMPARISON:  None. FINDINGS: Acute comminuted displaced intertrochanteric fracture of the right femur. Cortical irregularity versus overlying bowel of the right iliac bone.  Query slight asymmetry of the right sacroiliac joint compared to the left. Partially visualized left hip grossly unremarkable on frontal view. No right hip dislocation. No pelvic bone lesions are seen. IMPRESSION: 1. Acute comminuted displaced intertrochanteric fracture of the right femur. 2. Cortical irregularity versus overlying bowel of the right iliac bone. 3. Query slight asymmetry of the right sacroiliac joint compared to the left. Electronically Signed   By: Iven Finn M.D.   On: 05/14/2021 23:18  ? ?CT CHEST ABDOMEN PELVIS W CONTRAST ? ?Result Date: 05/15/2021 ?CLINICAL DATA:  Blunt poly trauma, fall EXAM: CT CHEST, ABDOMEN, AND PELVIS WITH CONTRAST TECHNIQUE: Multidetector CT imaging of the chest, abdomen and pelvis was performed following the standard protocol during bolus administration of intravenous contrast. RADIATION DOSE REDUCTION: This exam was performed according to the departmental dose-optimization program which includes automated exposure control, adjustment of the mA and/or kV according to patient size and/or use of iterative reconstruction technique. CONTRAST:  167mL OMNIPAQUE IOHEXOL 300 MG/ML  SOLN COMPARISON:  None. FINDINGS: CT CHEST FINDINGS Cardiovascular: Extensive multi-vessel coronary artery calcification. Mild global cardiomegaly. Left subclavian dual lead pacemaker is in place with leads within the right atrium and right ventricle toward the apex. No pericardial effusion. Central pulmonary arteries are of normal caliber. The thoracic aorta is of normal caliber. Mild atherosclerotic calcification. Mediastinum/Nodes: No pathologic thoracic adenopathy. Esophagus unremarkable. No pneumomediastinum. No mediastinal hematoma. Lungs/Pleura: Scattered areas of parenchymal scarring are noted within the left upper lung zone and right lung base. No confluent pulmonary infiltrate. No pneumothorax or pleural effusion. Central airways are widely patent. Musculoskeletal: Osseous structures are  diffusely osteopenic. Acute minimally displaced right acromial fracture noted. Healed right clavicle fracture noted. 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

## 2021-06-02 DIAGNOSIS — G3184 Mild cognitive impairment, so stated: Secondary | ICD-10-CM

## 2021-06-02 DIAGNOSIS — K5901 Slow transit constipation: Secondary | ICD-10-CM

## 2021-06-02 NOTE — Progress Notes (Signed)
Patient places himself on CPAP.  No distress noted ?

## 2021-06-02 NOTE — Progress Notes (Signed)
Patient ID: Jack Neal, male   DOB: August 16, 1945, 76 y.o.   MRN: [id number] ? ?SW returned phone call to pt wife [name] who was inquiring about a w/c. SW informed not aware but will confirm if w/c needed with therapy, and if so, item will be delivered to the home since it will be ordered by [place]. She also reported she has a medical appt that starts at 9am and until to get here until 1:30pm. SW informed medical team.  ?*Therapy confirms w/c needed for energy conservation and safety within the home. This does not serve as a barrier to d/c as he is ambulating 140ft with RW.  ? ?SW sent w/c [facility] to [facility]. SW called pt wife [name] to inform on above and indicated w/c will be delivered to home. Unsure on how long it will take for item to be received.  ? ?Cecile Sheerer, MSW, LCSWA ?Office: [phone] ?Cell: [phone] ?Fax: [phone]  ?

## 2021-06-02 NOTE — Progress Notes (Signed)
Physical Therapy Session Note ? ?Patient Details  ?Name: Toma Bechard ?MRN: [id number] ?Date of Birth: 11-09-1945 ? ?Today's Date: 06/02/2021 ?PT Individual Time: 1120-1200 ?PT Individual Time Calculation (min): 40 min  ? ?Short Term Goals: ?Week 2:  PT Short Term Goal 1 (Week 2): = LTG d/t ELOS ? ?Skilled Therapeutic Interventions/Progress Updates:  ?   ?Patient in w/c in the room upon PT arrival. Patient alert and agreeable to PT session. Patient denied pain during session. ? ?Patient reports no change in symptoms of dizziness with mobility during admission. Reports he has only performed oculomotor exercises x1 since prescribed. Educated on performing exercises 3x per day for best results. Patient in agreement. Focused session on fall risk education and reviewing HEP for oculomotor exercises.  ? ?Educated patient on present factors that put patients at risk for falls, fall risk/prevention, home modifications to prevent falls, and activation of emergency services in the event of a fall during session. Discussed limiting distractions when standing or walking, as decreased attention and dual task focus are diminished and increased risk for falls. Also, discussed locating a seat to sit down when episodes of vertigo occur to limited instability in standing or walking.  ? ?Patient performed the following exercises from HEP handout with verbal and visual cues for proper technique. ?Access Code: [email or web address] ?Standing Gaze Stabilization with Head Rotation - 3 x daily - 7 x weekly - 1 sets - 3 reps - 2 min hold ?Standing Gaze Stabilization with Two Near Targets and Head Rotation - 3 x daily - 7 x weekly - 1 sets - 13 reps - 2 min hold ?Seated Proximal-Distal Smooth Pursuit - 3 x daily - 7 x weekly - 1 sets - 3 reps - 2 min hold ?Seated Proximal-Distal accomodation (1 card at arms length, one card on the wall 156 ft away) - 3 x daily - 7 x weekly - 1 sets - 3 reps - 2 min hold ? ?Performed 2 sets in sitting until symptoms reached  4-5/10 and 1 set in standing with CGA for balance without upper extremity support. ? ?Patient performed transfers with mod I using RW throughout.  ? ?Patient in w/c in the room at end of session with breaks locked and all needs within reach. Patient is Mod I in the room per lead therapy team, no alarm needed at this time.  ? ?Therapy Documentation ?Precautions:  ?Precautions ?Precautions: Fall ?Restrictions ?Weight Bearing Restrictions: No ?RUE Weight Bearing: Weight bearing as tolerated ?RLE Weight Bearing: Weight bearing as tolerated ?Other Position/Activity Restrictions: RUE shoulder </ 90' flexion and abduction ? ? ? ?Therapy/Group: Individual Therapy ? ?Doreene Burke PT, DPT ? ?06/02/2021, 12:17 PM  ?

## 2021-06-02 NOTE — Progress Notes (Signed)
?                                                       PROGRESS NOTE ? ? ?Subjective/Complaints: ? ?Pt let me know that he doesn't want to go home on MSIR. He last took one yesterday. He feels that tylenol controls the pain well enough now ? ?ROS: Patient denies fever, rash, sore throat, blurred vision, dizziness, nausea, vomiting, diarrhea, cough, shortness of breath or chest pain,  headache, or mood change.  ? ? ? ? ?Objective: ?  ?No results found. ?No results for input(s): WBC, HGB, HCT, PLT in the last 72 hours. ? ? ?No results for input(s): NA, K, CL, CO2, GLUCOSE, BUN, CREATININE, CALCIUM in the last 72 hours. ? ? ? ?Intake/Output Summary (Last 24 hours) at 06/02/2021 0936 ?Last data filed at 06/02/2021 0600 ?Gross per 24 hour  ?Intake 460 ml  ?Output 1250 ml  ?Net -790 ml  ?  ? ?  ? ?Physical Exam: ?Vital Signs ?Blood pressure 106/64, pulse 72, temperature 97.8 ?F (36.6 ?C), temperature source Oral, resp. rate 18, height 5\' 11"  (1.803 m), SpO2 97 %. ? ? ? ?Constitutional: No distress . Vital signs reviewed. ?HEENT: NCAT, EOMI, oral membranes moist ?Neck: supple ?Cardiovascular: RRR without murmur. No JVD    ?Respiratory/Chest: CTA Bilaterally without wheezes or rales. Normal effort    ?GI/Abdomen: BS +, non-tender, non-distended ?Ext: no clubbing, cyanosis, or edema ?Psych: pleasant and cooperative  ?Musculoskeletal: R hip and RLE swelling minimal- TTP R shoulder ?   Cervical back: Neck supple. No tenderness.  ?   Comments: UE strength 5/5 B/L ?LLE- 5/5 in HF, KE, DF and PF ?RLE- 2/5 in proximal RLE; 5/5 distally ?TTP over R lateral hip and groin as well as R acromium - no change ?Skin: ?   Comments: original post-op dressing in place on hip ?1+ LE edema to distal calves B/L ?Venous stasis changes distal 1/3 of calves B/L ?R forearm IV - looks OK - looks OK ?Neurological:  ?   Alert and oriented x 3. Normal insight and awareness. Intact Memory. Normal language and speech. Cranial nerve exam unremarkable   ?Decreased sensation to light touch in lower legs bowel knees B/L  ?Otherwise, intact above knees and in arms  ? ?Assessment/Plan: ?1. Functional deficits which require 3+ hours per day of interdisciplinary therapy in a comprehensive inpatient rehab setting. ?Physiatrist is providing close team supervision and 24 hour management of active medical problems listed below. ?Physiatrist and rehab team continue to assess barriers to discharge/monitor patient progress toward functional and medical goals ? ?Care Tool: ? ?Bathing ? Bathing activity did not occur: Refused ?Body parts bathed by patient: Right arm, Face, Left arm, Left lower leg, Chest, Abdomen, Front perineal area, Buttocks, Right upper leg, Left upper leg, Right lower leg  ?   ?  ?  ?Bathing assist Assist Level: Independent with assistive device ?  ?  ?Upper Body Dressing/Undressing ?Upper body dressing Upper body dressing/undressing activity did not occur (including orthotics): Refused ?What is the patient wearing?: Button up shirt ?   ?Upper body assist Assist Level: Independent ?   ?Lower Body Dressing/Undressing ?Lower body dressing ? ? ?   ?What is the patient wearing?: Underwear/pull up, Pants ? ?  ? ?Lower body assist Assist for lower body  dressing: Independent with assitive device ?   ? ?Toileting ?Toileting Toileting Activity did not occur Landscape architect and hygiene only): Refused  ?Toileting assist Assist for toileting: Independent with assistive device ?  ?  ?Transfers ?Chair/bed transfer ? ?Transfers assist ?   ? ?Chair/bed transfer assist level: Supervision/Verbal cueing ?  ?  ?Locomotion ?Ambulation ? ? ?Ambulation assist ? ?   ? ?Assist level: Supervision/Verbal cueing ?Assistive device: Walker-rolling ?Max distance: 1110ft  ? ?Walk 10 feet activity ? ? ?Assist ?   ? ?Assist level: Supervision/Verbal cueing ?Assistive device: Walker-rolling  ? ?Walk 50 feet activity ? ? ?Assist Walk 50 feet with 2 turns activity did not occur:  Safety/medical concerns ? ?Assist level: Supervision/Verbal cueing ?Assistive device: Walker-rolling  ? ? ?Walk 150 feet activity ? ? ?Assist Walk 150 feet activity did not occur: Safety/medical concerns ? ?Assist level: Contact Guard/Touching assist ?Assistive device: Walker-rolling ?  ? ?Walk 10 feet on uneven surface  ?activity ? ? ?Assist Walk 10 feet on uneven surfaces activity did not occur: Safety/medical concerns ? ? ?  ?   ? ?Wheelchair ? ? ? ? ?Assist Is the patient using a wheelchair?: Yes ?Type of Wheelchair: Manual ?  ? ?Wheelchair assist level: Supervision/Verbal cueing ?Max wheelchair distance: 150'  ? ? ?Wheelchair 50 feet with 2 turns activity ? ? ? ?Assist ? ?  ?  ? ? ?Assist Level: Supervision/Verbal cueing  ? ?Wheelchair 150 feet activity  ? ? ? ?Assist ?   ? ? ?Assist Level: Supervision/Verbal cueing  ? ?Blood pressure 106/64, pulse 72, temperature 97.8 ?F (36.6 ?C), temperature source Oral, resp. rate 18, height 5\' 11"  (1.803 m), SpO2 97 %. ? ?Medical Problem List and Plan: ?1. Functional deficits secondary to acute minimally displaced fracture of right acromion with conservative care/weightbearing as tolerated.  Acute comminuted angulated intertrochanteric fracture of right hip with moderate surrounding intramuscular interstitial hemorrhage.  Status post open treatment with intramedullary implant and interlocking screws 05/15/2021.  Weightbearing as tolerated. ?            -patient may  shower if dressing covered ?            -ELOS/Goals: 10-14 days supervision ? D/c date  set for 3/21 ? -will see if he we can get ortho to see him prior to discharge as his f/u appt is Thursday  ?2.  Antithrombotics: ?-DVT/anticoagulation:  Pharmaceutical: Lovenox x6 weeks.  Check vascular study ?3/14- Dopplers were negative on 3/8- will add SCDs for RLE edema and con't TED's- don't think he has DVT on BID lovenox- likely dependent edema. ?3/19- swelling much improved  ?            -antiplatelet therapy: Aspirin  81 mg daily and Plavix 75 mg daily ?3. Pain Management/chronic pain/peripheral polyneuropathy: Maintained on Suboxone 2-0.5 mg 1 tablet daily as prior to admission followed by Dr.IHEAGWARA at the [facility] [phone] extension [phone], Voltaren gel as needed, Robaxin as needed, hydrocodone as needed ? 3/9- not taking Norco much- makes him confused/drugged feeling- will change to [facility] for now 15 mg- since had good response to IV morphine- won't use IV. Will monitoring behavior closely, since on Suboxone and MSIR ? 3/10- pain worse this AM- will add lidocaine patches 8am to 8pm- likely due to weather, making it worse ? 3/14- pain worse again last night- likely due to cold weather- was 29 this AM.  ? 3/15- pt wants to wean MSIR- I recommended taking it  less frequently, since at lowest dose ? 3/16- only took MSIR 3x in last 24 hours- con't to wean as tolerated ? 3/17- reducing MSIR Rx- 2x/ in last 24 hours- continue to wean as tolerated ? 3/19- can go home with MSIR 10 pills- only taking ~ 2x/day right now- I'm fine with this ? 3/20 pt does NOT WANT TO GO HOME WITH MS IR ?  -he'll use tylenol ?  -continue on suboxone per his psychiatrist ?4. Mood/PTSD with dementia: Cymbalta 60 mg daily and 30 mg nightly, Namenda 10 mg daily and 20 mg nightly,Razadyne 16 mg nightly ? 3/9- doesn't want to change depression meds- just needs to talk about where he's at- emotional support ?            -antipsychotic agents: N/A ?5. Neuropsych: This patient is capable of making decisions on his own behalf. ?6. Skin/Wound Care: Routine skin checks.  Keep Aquacel on and dry for up to 14 days. ? 3/20 remove post-op dressing and staples today ?7. Fluids/Electrolytes/Nutrition: Routine in and outs with follow-up chemistries ?8.  Hypertension.  Monitor with increased mobility ?9.  OSA.  CPAP ?10.  Acute blood loss anemia.  S/p 2 units pRBCs. Follow-up CBC.  Continue iron supplement ?11.  Atrial  fibrillation/pacemaker/watchman.  Cardiac rate controlled. ?12.  Pancreatic insufficiency.  Pancrelipase ?13. GERD_ just restarted Porotonix- having a lot of GERD Sx's- con't tums prn ? 3/8- reflux better this AM- con't regimen ?14.

## 2021-06-02 NOTE — Progress Notes (Signed)
Inpatient Rehabilitation Discharge Medication Review by a Pharmacist ? ?A complete drug regimen review was completed for this patient to identify any potential clinically significant medication issues. ? ?High Risk Drug Classes Is patient taking? Indication by Medication  ?Antipsychotic No   ?Anticoagulant Yes Lovenox for VTE ppx  ?Antibiotic No   ?Opioid No   ?Antiplatelet Yes Plavix, ASA for CVA  ?Hypoglycemics/insulin No   ?Vasoactive Medication No   ?Chemotherapy No   ?Other Yes Suboxone for hx of opioid dependence ?  ?Doxepin, Cymbalta, Razadyne, Namenda, hydroxyzine, trazodone for dementia with behavioral disturbance ?  ?Pancrease for pancreatic insufficiency ?   ? ? ? ?Type of Medication Issue Identified Description of Issue Recommendation(s)  ?Drug Interaction(s) (clinically significant) ?    ?Duplicate Therapy ?    ?Allergy ?    ?No Medication Administration End Date ?    ?Incorrect Dose ?    ?Additional Drug Therapy Needed ?    ?Significant med changes from prior encounter (inform family/care partners about these prior to discharge).    ?Other ?    ? ? ?Clinically significant medication issues were identified that warrant physician communication and completion of prescribed/recommended actions by midnight of the next day:  No ? ?Pharmacist comments: None ? ?Time spent performing this drug regimen review (minutes):  20 minutes ? ? ?Tad Moore ?06/02/2021 9:44 AM ?

## 2021-06-02 NOTE — Plan of Care (Signed)
?  Problem: Consults ?Goal: RH GENERAL PATIENT EDUCATION ?Description: See Patient Education module for education specifics. ?Outcome: Progressing ?Goal: Skin Care Protocol Initiated - if Braden Score 18 or less ?Description: If consults are not indicated, leave blank or document N/A ?Outcome: Progressing ?  ?Problem: RH SKIN INTEGRITY ?Goal: RH STG MAINTAIN SKIN INTEGRITY WITH ASSISTANCE ?Description: STG Maintain Skin Integrity With Supervision Assistance. ?Outcome: Progressing ?Goal: RH STG ABLE TO PERFORM INCISION/WOUND CARE W/ASSISTANCE ?Description: STG Able To Perform Incision/Wound Care With Supervision Assistance. ?Outcome: Progressing ?  ?Problem: RH PAIN MANAGEMENT ?Goal: RH STG PAIN MANAGED AT OR BELOW PT'S PAIN GOAL ?Description: < 3 on a 0-10 pain scale. ?Outcome: Progressing ?  ?Problem: RH KNOWLEDGE DEFICIT GENERAL ?Goal: RH STG INCREASE KNOWLEDGE OF SELF CARE AFTER HOSPITALIZATION ?Description: Patient will demonstrate knowledge of self-care, pain management, skin/wound care, and weight bearing precautions with educational materials and handouts provided by staff independently at discharge. ?Outcome: Progressing ?  ?

## 2021-06-02 NOTE — Progress Notes (Signed)
Occupational Therapy Session Note ? ?Patient Details  ?Name: Jack Neal ?MRN: [id number] ?Date of Birth: [birth year] ? ?Today's Date: 06/02/2021 ?OT Individual Time: [phone] ?OT Individual Time Calculation (min): 73 min  ? ? ?Short Term Goals: ?Week 2:  OT Short Term Goal 1 (Week 2): STG=LTG 2/2 ELOS (continue working towards mod I LTGs) ? ?Skilled Therapeutic Interventions/Progress Updates:  ?  OT intervention with focus on bed mobility, functional amb with RW, bathing at shower level, dressing with sit<>stand from EOB, w/c mobility, and safety awareness to prepare for discharge home tomorrow. Pt completed all bathing/dressing tasks with mod I/I. Pt requires more then a reasonable amount of time to complete tasks and is very deliberate in all transitional movements. No unsafe behavior noted. Pt propelled w/c to therapy room and requested to use NuStep to "loosen up." Pt completed 5 mins level 1 on NuStep before returning to room. Pt remained in w/c with all needs within reach.  ? ?Therapy Documentation ?Precautions:  ?Precautions ?Precautions: Fall ?Restrictions ?Weight Bearing Restrictions: Yes ?RUE Weight Bearing: Weight bearing as tolerated ?RLE Weight Bearing: Weight bearing as tolerated ?Other Position/Activity Restrictions: RUE shoulder </ 90' flexion and abduction ?Pain: ?Pt initially denied pain but reported increased Rt hip pain with LB dressing; repositioned.  ? ? ?Therapy/Group: Individual Therapy ? ?[name] ?06/02/2021, 8:20 AM ?

## 2021-06-02 NOTE — Progress Notes (Signed)
Occupational Therapy Discharge Summary ? ?Patient Details  ?Name: Jack Neal ?MRN: [id number] ?Date of Birth: [birth year] ? ? ?Patient has met 10 of 10 long term goals due to improved activity tolerance, improved balance, ability to compensate for deficits, and improved coordination.  Pt made excellent progress with BADLs and IADLs during this admission. Pt completes bathing/dressing tasks at mod I level. Toileting and all functional tranfsers with mod I using RW. Standing balance with mod I. Pt's wife has participated in therapy education and provides the appropriate level of supervision PRN. Patient to discharge at overall Modified Independent level.  Patient's care partner is independent to provide the necessary physical assistance at discharge.   ? ?Reasons goals not met: N/A ? ?Recommendation:  ?Patient will benefit from ongoing skilled OT services in home health setting to continue to advance functional skills in the area of BADL and Reduce care partner burden. ? ?Equipment: ?No equipment provided ? ?Reasons for discharge: treatment goals met and discharge from hospital ? ?Patient/family agrees with progress made and goals achieved: Yes ? ?OT Discharge ?ADL ?ADL ?Equipment Provided: Long-handled shoe horn ?Eating: Independent ?Where Assessed-Eating: Chair ?Grooming: Independent ?Where Assessed-Grooming: Sitting at sink ?Upper Body Bathing: Modified independent ?Where Assessed-Upper Body Bathing: Shower ?Lower Body Bathing: Not assessed ?Upper Body Dressing: Independent ?Where Assessed-Upper Body Dressing: Edge of bed ?Lower Body Dressing: Modified independent ?Where Assessed-Lower Body Dressing: Edge of bed ?Toileting: Modified independent ?Toilet Transfer: Modified independent ?Toilet Transfer Method: Ambulating ?Science writer: Grab bars ?Tub/Shower Transfer: Not assessed ?Walk-In Shower Transfer: Modified independent ?Walk-In Shower Transfer Method: Ambulating ?Walk-In Shower Equipment: Probation officer ?ADL Comments: Declined bathing and toileting at time of initial evaluation. ?Vision ?Baseline Vision/History: 1 Wears glasses ?Patient Visual Report: No change from baseline ?Additional Comments: occasional diplopia PTA ?Perception  ?Perception: Within Functional Limits ?Praxis ?Praxis: Intact ?Cognition ?Cognition ?Overall Cognitive Status: Within Functional Limits for tasks assessed ?Arousal/Alertness: Awake/alert ?Orientation Level: Person;Place;Situation ?Place: Oriented ?Situation: Oriented ?Memory: Appears intact ?Attention: Sustained ?Focused Attention: Appears intact ?Sustained Attention: Appears intact ?Awareness: Appears intact ?Problem Solving: Appears intact ?Brief Interview for Mental Status (BIMS) ?Repetition of Three Words (First Attempt): 3 ?Temporal Orientation: Year: Correct ?Temporal Orientation: Month: Accurate within 5 days ?Temporal Orientation: Day: Correct ?Recall: "Sock": Yes, no cue required ?Recall: "Blue": Yes, no cue required ?Recall: "Bed": Yes, no cue required ?BIMS Summary Score: 15 ?Sensation ?Sensation ?Light Touch: Appears Intact ?Peripheral sensation comments: Intact in BUE ?Light Touch Impaired Details: Impaired RLE;Impaired LLE ?Hot/Cold: Appears Intact ?Proprioception: Appears Intact ?Stereognosis: Not tested ?Coordination ?Gross Motor Movements are Fluid and Coordinated: Yes ?Fine Motor Movements are Fluid and Coordinated: Yes ?Finger Nose Finger Test: Intact ?Motor  ?Motor ?Motor: Abnormal postural alignment and control ?Mobility  ?  Mod I standing balance, sit to stands and standing balance  ?Trunk/Postural Assessment  ?Cervical Assessment ?Cervical Assessment: Exceptions to [facility] (forward head) ?Thoracic Assessment ?Thoracic Assessment: Exceptions to [facility] (rounded shoulders) ?Lumbar Assessment ?Lumbar Assessment: Exceptions to [facility] (posterior pelvic tilt) ?Postural Control ?Righting Reactions: impaired/delayed  ?Balance ?Static Sitting Balance ?Static Sitting -  Balance Support: Feet supported ?Static Sitting - Level of Assistance: 6: Modified independent (Device/Increase time) ?Dynamic Sitting Balance ?Dynamic Sitting - Balance Support: During functional activity ?Dynamic Sitting - Level of Assistance: 6: Modified independent (Device/Increase time) ?Extremity/Trunk Assessment ?RUE Assessment ?RUE Assessment: Within Functional Limits ?LUE Assessment ?LUE Assessment: Within Functional Limits ? ? ?[name] ?06/02/2021, 7:59 AM ?

## 2021-06-02 NOTE — Progress Notes (Signed)
Physical Therapy Discharge Summary ? ?Patient Details  ?Name: Jack Neal ?MRN: [id number] ?Date of Birth: 05-20-1945 ? ?Today's Date: 06/02/2021 ? ? ?Patient has met 13 of 13 long term goals due to improved activity tolerance, improved balance, increased strength, decreased pain, functional use of  right lower extremity, and improved coordination.  Patient to discharge at an ambulatory level Modified Independent.   Patient's care partner is independent to provide the necessary physical assistance at discharge. Patient's wife came in for hands-on family education. ? ?Reasons goals not met: N/A - All therapy goals met. ? ?Recommendation:  ?Patient will benefit from ongoing skilled PT services in home health setting to continue to advance safe functional mobility, address ongoing impairments in endurance, strength, balance, gaze instability, and minimize fall risk. ? ?Equipment: ?Rolling walker, 18x18 wheelchair  ? ?Reasons for discharge: treatment goals met and discharge from hospital ? ?Patient/family agrees with progress made and goals achieved: Yes ? ?PT Discharge ?Precautions/Restrictions ?Precautions ?Precautions: Fall ?Restrictions ?Weight Bearing Restrictions: No ?RUE Weight Bearing: Weight bearing as tolerated ?RLE Weight Bearing: Weight bearing as tolerated ?Other Position/Activity Restrictions: RUE shoulder </ 90' flexion and abduction ?  ?Pain Interference ?Pain Interference ?Pain Effect on Sleep: 1. Rarely or not at all ?Pain Interference with Therapy Activities: 2. Occasionally ?Pain Interference with Day-to-Day Activities: 2. Occasionally (after extended periods of being active) ?Vision/Perception  ?Perception ?Perception: Within Functional Limits ?Praxis ?Praxis: Intact  ?Cognition ?Overall Cognitive Status: Within Functional Limits for tasks assessed ?Arousal/Alertness: Awake/alert ?Orientation Level: Oriented X4 ?Year: 2023 ?Month: March ?Day of Week: Correct ?Attention: Sustained ?Focused Attention:  Appears intact ?Sustained Attention: Appears intact ?Memory: Appears intact ?Awareness: Appears intact ?Problem Solving: Appears intact ?Safety/Judgment: Appears intact ?Sensation ?Sensation ?Light Touch: Appears Intact ?Peripheral sensation comments: Intact in BUE ?Light Touch Impaired Details: Impaired RLE;Impaired LLE ?Hot/Cold: Appears Intact ?Proprioception: Appears Intact ?Stereognosis: Not tested ?Coordination ?Gross Motor Movements are Fluid and Coordinated: Yes ?Fine Motor Movements are Fluid and Coordinated: Yes ?Coordination and Movement Description: Mild deficits in BUE ?Finger Nose Finger Test: Intact ?Motor  ?Motor ?Motor: Abnormal postural alignment and control ?Motor - Skilled Clinical Observations: impaired 2/2 pain ?Motor - Discharge Observations: impaired 2/2 pain, improved from evaluation  ?Mobility ?Bed Mobility ?Bed Mobility: Rolling Right;Rolling Left;Supine to Sit;Sit to Supine ?Rolling Right: Independent ?Rolling Left: Independent ?Supine to Sit: Independent ?Sit to Supine: Independent ?Transfers ?Transfers: Sit to [employer] Transfers ?Sit to Stand: Independent with assistive device ?Stand Pivot Transfers: Independent with assistive device ?Stand Pivot Transfer Details: Verbal cues for precautions/safety ?Transfer (Assistive device): Rolling walker ?Locomotion  ?Gait ?Ambulation: Yes ?Gait Assistance: Independent with assistive device ?Gait Distance (Feet): 250 Feet ?Assistive device: Rolling walker ?Gait ?Gait: Yes ?Gait Pattern: Within Functional Limits ?Gait Pattern: Step-to pattern;Decreased step length - left;Decreased stance time - right;Antalgic;Lateral trunk lean to left ?Gait velocity: decreased ?Stairs / Additional Locomotion ?Stairs: No ?Ramp: Independent with assistive device ?Curb: Other (comment) (Not performed due to safety/medical concerns) ?Wheelchair Mobility ?Wheelchair Mobility: Yes ?Wheelchair Assistance: Independent with assistive device ?Wheelchair Propulsion:  Both upper extremities ?Wheelchair Parts Management: Supervision/cueing ?Distance: 150  ?Trunk/Postural Assessment  ?Cervical Assessment ?Cervical Assessment: Exceptions to [facility] ?Thoracic Assessment ?Thoracic Assessment: Exceptions to [facility] ?Lumbar Assessment ?Lumbar Assessment: Exceptions to [facility] ?Postural Control ?Postural Control: Within Functional Limits ?Righting Reactions: impaired/delayed  ?Balance ?Balance ?Balance Assessed: Yes ?Static Sitting Balance ?Static Sitting - Balance Support: Feet supported ?Static Sitting - Level of Assistance: 6: Modified independent (Device/Increase time) ?Dynamic Sitting Balance ?Dynamic Sitting - Balance Support: During functional activity ?  Dynamic Sitting - Level of Assistance: 6: Modified independent (Device/Increase time) ?Static Standing Balance ?Static Standing - Balance Support: Bilateral upper extremity supported;During functional activity ?Static Standing - Level of Assistance: 6: Modified independent (Device/Increase time) ?Dynamic Standing Balance ?Dynamic Standing - Balance Support: Bilateral upper extremity supported;During functional activity ?Dynamic Standing - Level of Assistance: 6: Modified independent (Device/Increase time) ?Extremity Assessment  ?RUE Assessment ?RUE Assessment: Within Functional Limits ?LUE Assessment ?LUE Assessment: Within Functional Limits ?RLE Assessment ?RLE Assessment: Exceptions to [facility] ?General Strength Comments: impaired 2/2 pain ?RLE Strength ?Right Hip Flexion: 2+/5 ?Right Knee Flexion: 4-/5 ?Right Knee Extension: 4+/5 ?Right Ankle Dorsiflexion: 5/5 ?LLE Assessment ?LLE Assessment: Within Functional Limits ?LLE Strength ?Left Hip Flexion: 4+/5 ?Left Knee Flexion: 5/5 ?Left Knee Extension: 5/5 ?Left Ankle Dorsiflexion: 5/5 ? ? ? ?Lanetta Inch ?06/02/2021, 5:23 PM ?

## 2021-06-02 NOTE — Progress Notes (Signed)
Physical Therapy Session Note ? ?Patient Details  ?Name: Jack Neal ?MRN: [id number] ?Date of Birth: 11-22-1945 ? ?Today's Date: 06/02/2021 ?PT Individual Time: 8184-0375; 1300-1400 ? ?Short Term Goals: ?Week 2:  PT Short Term Goal 1 (Week 2): = LTG d/t ELOS ? ?Skilled Therapeutic Interventions/Progress Updates:  ?Session 1: ?Pt received sitting upright in wheelchair. Progressed to mod I in his room as of today after discussion with care team. Pt was agreeable to therapy and reports minimal pain in his shoulder today (not rated), premedicated. He requested to use the Nustep at the beginning of session; performed at level 5 for 6 minutes with BUE and BLEs. Pt was mod I with bed and W/C mobility, mod I for all transfers with RW and ambulation up to 125ft. Mod I for navigating ramp with rolling walker and supervision/verbal cuing for navigating ramp in wheelchair. Pt demonstrates good understanding of safe ambulation and when to ask for assistance when his endurance becomes limited. Pt was left seated in wheelchair with all needs in reach. ? ?Session 2: ?Pt received sitting in W/C. Pt reports mild pain in R shoulder and R hip (not rated) but has been medicated. Pt was agreeable to therapy. PT reviewed HEP given to pt and pt demonstrated good understanding of all activities with no further questions. Pt is mod I with w/c mobility for a max of 144ft and mod I with all transfers with RW. Pt performed standing dynamic balance exercises with the rebounder with RW CGA. He also performed side steps with RW and seated toe taps for hip strengthening, with supervision/verbal cuing to remain upright posture. Pt also demonstrated mild limitations with hip mobility on the RLE > LLE noted with heel-shin test. Pt was left in wheelchair with all needs in reach.  ? ?Therapy Documentation ?Precautions:  ?Precautions ?Precautions: Fall ?Restrictions ?Weight Bearing Restrictions: No ?RUE Weight Bearing: Weight bearing as tolerated ?RLE  Weight Bearing: Weight bearing as tolerated ?Other Position/Activity Restrictions: RUE shoulder </ 90' flexion and abduction ? ?Therapy/Group: Individual Therapy ? ?Charmian Muff ?06/02/2021, 5:04 PM  ?

## 2021-06-03 MED ORDER — ENOXAPARIN SODIUM 30 MG/0.3ML IJ SOSY: PREFILLED_SYRINGE | INTRAMUSCULAR | 0 refills | Status: DC

## 2021-06-03 MED ORDER — TRAZODONE HCL 50 MG PO TABS: 50.0000 mg | ORAL_TABLET | Freq: Every day | ORAL | 0 refills | Status: AC

## 2021-06-03 MED ORDER — ACETAMINOPHEN 325 MG PO TABS: 325.0000 mg | ORAL_TABLET | Freq: Four times a day (QID) | ORAL | Status: DC | PRN

## 2021-06-03 MED ORDER — LIDOCAINE 5 % EX PTCH: 2.0000 | MEDICATED_PATCH | CUTANEOUS | 0 refills | Status: DC

## 2021-06-03 MED ORDER — METHOCARBAMOL 500 MG PO TABS: 500.0000 mg | ORAL_TABLET | Freq: Four times a day (QID) | ORAL | 0 refills | Status: DC | PRN

## 2021-06-03 NOTE — Progress Notes (Signed)
Pt being discharged to home with personal belongings, discharge instructions provided by D. Anguilli PA, pt verbalized an understanding of follow up appts, medications, and discharge instructions. ?

## 2021-06-03 NOTE — Progress Notes (Signed)
Pt resting comfortably in bed.  Breathing even and unlabored.  Bed in lowest position, call within reach, NAD at this time. ?

## 2021-06-03 NOTE — Progress Notes (Signed)
Pt resting comfortably in bed.  Breathing even and unlabored.  Bed in lowest position, call bell within reach, NAD at this time. ?

## 2021-06-03 NOTE — Progress Notes (Signed)
?   06/03/21 1300  ?Clinical Encounter Type  ?Visited With Patient  ?Visit Type Follow-up ?(AD)  ?Referral From Chaplain  ?Consult/Referral To Chaplain  ?Spiritual Encounters  ?Spiritual Needs Emotional;Prayer  ? ?Referral from Chaplain Tery Sanfilippo for Notary Service. Articles patient needed notarized were personal in nature and client realized that he would need to use outside source. At patient's request we shared prayer of thanksgiving for his healing.[address] [place], [place] [phone]. ?

## 2021-06-03 NOTE — Progress Notes (Signed)
Pt administered lovenox injection to right lateral abd fold. Pt able to verbalize to rotate injections daily. Discussed putting injections in thick plastic container or similar options such as laundry detergent bottles and to take used injections to local pharmacy.   ?

## 2021-06-03 NOTE — Progress Notes (Signed)
Pt wearing CPAP and resting comfortably in bed.  Breathing even and unlabored.  Bed in lowest position, call bell within reach, NAD at this time. ?

## 2021-06-03 NOTE — Progress Notes (Signed)
?                                                       PROGRESS NOTE ? ? ?Subjective/Complaints: ? ?Pt reports only taking tylenol now and Suboxone- has it at home.  ? ?Slept well- leg achy, but tylenol works.  ? ?ROS:  ?Pt denies SOB, abd pain, CP, N/V/C/D, and vision changes ? ? ?Objective: ?  ?No results found. ?No results for input(s): WBC, HGB, HCT, PLT in the last 72 hours. ? ? ?No results for input(s): NA, K, CL, CO2, GLUCOSE, BUN, CREATININE, CALCIUM in the last 72 hours. ? ? ? ?Intake/Output Summary (Last 24 hours) at 06/03/2021 0844 ?Last data filed at 06/03/2021 0532 ?Gross per 24 hour  ?Intake --  ?Output 900 ml  ?Net -900 ml  ?  ? ?  ? ?Physical Exam: ?Vital Signs ?Blood pressure 96/71, pulse 73, temperature 97.9 ?F (36.6 ?C), temperature source Oral, resp. rate 17, height 5\' 11"  (1.803 m), SpO2 96 %. ? ? ? ? ?General: awake, alert, appropriate, sitting EOB; NAD ?HENT: conjugate gaze; oropharynx moist ?CV: regular rate; no JVD ?Pulmonary: CTA B/L; no W/R/R- good air movement ?GI: soft, NT, ND, (+)BS ?Psychiatric: appropriate ?Neurological: Ox3 ? ?Musculoskeletal: R hip and RLE swelling minimal- TTP R shoulder ?   Cervical back: Neck supple. No tenderness.  ?   Comments: UE strength 5/5 B/L ?LLE- 5/5 in HF, KE, DF and PF ?RLE- 2/5 in proximal RLE; 5/5 distally ?TTP over R lateral hip and groin as well as R acromium - no change ?Skin: ?   Comments: 1+ LE edema B/L- surgical dressing off- incision glued ?Venous stasis changes distal 1/3 of calves B/L ?R forearm IV - looks OK - looks OK ?Neurological:  ?   Alert and oriented x 3. Normal insight and awareness. Intact Memory. Normal language and speech. Cranial nerve exam unremarkable  ?Decreased sensation to light touch in lower legs bowel knees B/L  ?Otherwise, intact above knees and in arms  ? ?Assessment/Plan: ?1. Functional deficits which require 3+ hours per day of interdisciplinary therapy in a comprehensive inpatient rehab setting. ?Physiatrist is  providing close team supervision and 24 hour management of active medical problems listed below. ?Physiatrist and rehab team continue to assess barriers to discharge/monitor patient progress toward functional and medical goals ? ?Care Tool: ? ?Bathing ? Bathing activity did not occur: Refused ?Body parts bathed by patient: Right arm, Face, Left arm, Left lower leg, Chest, Abdomen, Front perineal area, Buttocks, Right upper leg, Left upper leg, Right lower leg  ?   ?  ?  ?Bathing assist Assist Level: Independent with assistive device ?  ?  ?Upper Body Dressing/Undressing ?Upper body dressing Upper body dressing/undressing activity did not occur (including orthotics): Refused ?What is the patient wearing?: Button up shirt ?   ?Upper body assist Assist Level: Independent ?   ?Lower Body Dressing/Undressing ?Lower body dressing ? ? ?   ?What is the patient wearing?: Underwear/pull up, Pants ? ?  ? ?Lower body assist Assist for lower body dressing: Independent with assitive device ?   ? ?Toileting ?Toileting Toileting Activity did not occur Landscape architect and hygiene only): Refused  ?Toileting assist Assist for toileting: Independent with assistive device ?  ?  ?Transfers ?Chair/bed transfer ? ?Transfers assist ?   ? ?  Chair/bed transfer assist level: Independent with assistive device ?Chair/bed transfer assistive device: Walker ?  ?Locomotion ?Ambulation ? ? ?Ambulation assist ? ?   ? ?Assist level: Independent with assistive device ?Assistive device: Walker-rolling ?Max distance: 150  ? ?Walk 10 feet activity ? ? ?Assist ?   ? ?Assist level: Independent with assistive device ?Assistive device: Walker-rolling  ? ?Walk 50 feet activity ? ? ?Assist Walk 50 feet with 2 turns activity did not occur: Safety/medical concerns ? ?Assist level: Independent with assistive device ?Assistive device: Walker-rolling  ? ? ?Walk 150 feet activity ? ? ?Assist Walk 150 feet activity did not occur: Safety/medical concerns ? ?Assist  level: Independent with assistive device ?Assistive device: Walker-rolling ?  ? ?Walk 10 feet on uneven surface  ?activity ? ? ?Assist Walk 10 feet on uneven surfaces activity did not occur: Safety/medical concerns ? ? ?Assist level: Independent with assistive device ?Assistive device: Walker-rolling  ? ?Wheelchair ? ? ? ? ?Assist Is the patient using a wheelchair?: Yes ?Type of Wheelchair: Manual ?  ? ?Wheelchair assist level: Independent ?Max wheelchair distance: 150'  ? ? ?Wheelchair 50 feet with 2 turns activity ? ? ? ?Assist ? ?  ?  ? ? ?Assist Level: Independent  ? ?Wheelchair 150 feet activity  ? ? ? ?Assist ?   ? ? ?Assist Level: Independent  ? ?Blood pressure 96/71, pulse 73, temperature 97.9 ?F (36.6 ?C), temperature source Oral, resp. rate 17, height 5\' 11"  (1.803 m), SpO2 96 %. ? ?Medical Problem List and Plan: ?1. Functional deficits secondary to acute minimally displaced fracture of right acromion with conservative care/weightbearing as tolerated.  Acute comminuted angulated intertrochanteric fracture of right hip with moderate surrounding intramuscular interstitial hemorrhage.  Status post open treatment with intramedullary implant and interlocking screws 05/15/2021.  Weightbearing as tolerated. ?            -patient may  shower if dressing covered ?            -ELOS/Goals: 10-14 days supervision ? D/c date  set for 3/21 ? -will see if he we can get ortho to see him prior to discharge as his f/u appt is Thursday ?D/c today-   ?2.  Antithrombotics: ?-DVT/anticoagulation:  Pharmaceutical: Lovenox x6 weeks.  Check vascular study ?3/14- Dopplers were negative on 3/8- will add SCDs for RLE edema and con't TED's- don't think he has DVT on BID lovenox- likely dependent edema. ?3/19- swelling much improved  ?            -antiplatelet therapy: Aspirin 81 mg daily and Plavix 75 mg daily ?3. Pain Management/chronic pain/peripheral polyneuropathy: Maintained on Suboxone 2-0.5 mg 1 tablet daily as prior to admission  followed by Dr.IHEAGWARA at the [facility] [phone] extension [phone], Voltaren gel as needed, Robaxin as needed, hydrocodone as needed ? 3/9- not taking Norco much- makes him confused/drugged feeling- will change to [facility] for now 15 mg- since had good response to IV morphine- won't use IV. Will monitoring behavior closely, since on Suboxone and MSIR ? 3/10- pain worse this AM- will add lidocaine patches 8am to 8pm- likely due to weather, making it worse ? 3/14- pain worse again last night- likely due to cold weather- was 29 this AM.  ? 3/15- pt wants to wean MSIR- I recommended taking it less frequently, since at lowest dose ? 3/16- only took MSIR 3x in last 24 hours- con't to wean as tolerated ? 3/17- reducing MSIR Rx- 2x/ in last  24 hours- continue to wean as tolerated ? 3/19- can go home with MSIR 10 pills- only taking ~ 2x/day right now- I'm fine with this ? 3/20 pt does NOT WANT TO GO HOME WITH MS IR ?  -he'll use tylenol ?  -continue on suboxone per his psychiatrist ? 3/21- going home off MSIR ?4. Mood/PTSD with dementia: Cymbalta 60 mg daily and 30 mg nightly, Namenda 10 mg daily and 20 mg nightly,Razadyne 16 mg nightly ? 3/9- doesn't want to change depression meds- just needs to talk about where he's at- emotional support ?            -antipsychotic agents: N/A ?5. Neuropsych: This patient is capable of making decisions on his own behalf. ?6. Skin/Wound Care: Routine skin checks.  Keep Aquacel on and dry for up to 14 days. ? 3/20 remove post-op dressing and staples today ?7. Fluids/Electrolytes/Nutrition: Routine in and outs with follow-up chemistries ?8.  Hypertension.  Monitor with increased mobility ?9.  OSA.  CPAP ?10.  Acute blood loss anemia.  S/p 2 units pRBCs. Follow-up CBC.  Continue iron supplement ?11.  Atrial fibrillation/pacemaker/watchman.  Cardiac rate controlled. ?12.  Pancreatic insufficiency.  Pancrelipase ?13. GERD_ just restarted Porotonix- having a  lot of GERD Sx's- con't tums prn ? 3/8- reflux better this AM- con't regimen ?14. Azotemia ? 3/9- will recheck Monday- BUN up to 26 and Cr ok- likely dry- will push fluids ? 3/12- labs in AM ? 3/13- BUN 26

## 2021-06-04 NOTE — Progress Notes (Signed)
Inpatient Rehabilitation Care Coordinator ?Discharge Note  ? ?Patient Details  ?Name: Jack Neal ?MRN: [id number] ?Date of Birth: March 03, 1946 ? ? ?Discharge location: D/c to home with his wife ? ?Length of Stay: 14 days ? ?Discharge activity level: Mod I ? ?Home/community participation: Limited ? ?Patient response [id number] Literacy - How often do you need to have someone help you when you read instructions, pamphlets, or other written material from your doctor or pharmacy?: Never ? ?Patient response [id number] Isolation - How often do you feel lonely or isolated from those around you?: Never ? ?Services provided included: MD, RD, PT, OT, SLP, RN, TR, Pharmacy, Neuropsych, SW, CM ? ?Financial Services:  ?Charity fundraiser Utilized: Private Insurance ?[employer] ? ?Choices offered to/list presented to: Yes ? ?Follow-up services arranged:  ?DME, Home Health ?[facility] Health Agency: [facility] HH[facility]  ?  ?[facility]) ?  ? ?Patient response to transportation need: ?Is the patient able to respond to transportation needs?: Yes ?In the past 12 months, has lack of transportation kept you from medical appointments or from getting medications?: No ?In the past 12 months, has lack of transportation kept you from meetings, work, or from getting things needed for daily living?: No ? ? ?Comments (or additional information): ? ?Patient/Family verbalized understanding of follow-up arrangements:  Yes ? ?Individual responsible for coordination of the follow-up plan: Contact pt wife Audrea Muscat ? ?Confirmed correct DME delivered: Rana Snare 06/04/2021   ? ?Rana Snare ?

## 2021-07-03 ENCOUNTER — Telehealth: Payer: Self-pay | Admitting: Cardiology

## 2021-07-03 NOTE — Telephone Encounter (Signed)
? ? ?  Patient is now 6 months out from [facility] implant. He is doing well recovering from hip fracture with repair. He may now stop Plavix and continue ASA 81mg  QD. Plavix removed from medication list. He no longer will require SBE for dental cleanings and dental procedures. This was discussed with the patient. He has follow up with Dr. on 07/29/21.  ? ?07/31/21 NP-C ?Structural Heart Team  ?Pager: [phone] ?Phone: [phone] ? ?

## 2021-07-10 ENCOUNTER — Telehealth: Payer: Self-pay

## 2021-07-10 NOTE — Telephone Encounter (Signed)
NOTES SCANNED TO REFERRAL 

## 2021-07-29 ENCOUNTER — Ambulatory Visit (INDEPENDENT_AMBULATORY_CARE_PROVIDER_SITE_OTHER): Payer: No Typology Code available for payment source | Admitting: Cardiology

## 2021-07-29 ENCOUNTER — Encounter: Payer: Self-pay | Admitting: Cardiology

## 2021-07-29 VITALS — BP 106/66 | HR 83 | Ht 70.0 in | Wt 165.8 lb

## 2021-07-29 DIAGNOSIS — I4821 Permanent atrial fibrillation: Secondary | ICD-10-CM

## 2021-07-29 DIAGNOSIS — I5032 Chronic diastolic (congestive) heart failure: Secondary | ICD-10-CM | POA: Diagnosis not present

## 2021-07-29 DIAGNOSIS — Z95818 Presence of other cardiac implants and grafts: Secondary | ICD-10-CM | POA: Diagnosis not present

## 2021-07-29 NOTE — Progress Notes (Signed)
?Electrophysiology Office Follow up Visit Note:   ? ?Date:  07/29/2021  ? ?ID:  Jack Neal, DOB 03/20/1945, MRN [id number] ? ?[facility]  [facility]  ?[facility] Cardiologist:  None  ?[facility] Electrophysiologist:  Lanier PrudeAMERON T Nahdia Doucet, MD  ? ? ?Interval History:   ? ?Jack AuerJerry Neal is a 76 y.o. male who presents for a follow up visit. They were last seen in clinic 12/03/2020. ? ?Since their last appointment, they underwent LAAO on 01/02/2021. ? ?He was last seen in cardiology by Georgie ChardJill McDaniel, NP on 01/27/2021 where he was doing well and tolerating low dose Eliquis and ASA. His EKG showed AF with V pacing, HR 83 bpm. ? ?Overall, he is feeling okay. However, he recently suffered a hip fracture. He is healing well. He notes that he would be able to walk with a cane easily if it wasn't for his chronic dizziness. ? ?Of note, he has been monitoring his weight at home every morning before breakfast. He presents a log showing some weight loss. Lately he has been attempting to eat more, throughout 5-6 meals a day. Generally he is not able to eat much at one time due to his gastric bypass surgery. [facility]. ? ?[facility] ? ?He remains compliant with ASA. ? ?He denies any palpitations, chest pain, shortness of breath, or peripheral edema. No headaches, syncope, orthopnea, or PND. ? ? ?  ? ?Past Medical History:  ?Diagnosis Date  ? AF (paroxysmal atrial fibrillation) ([name]) 10/01/2020  ? Atrial fibrillation ([name])   ? Celiac disease 10/01/2020  ? Chronic pain syndrome 10/01/2020  ? Depression   ? GERD (gastroesophageal reflux disease)   ? 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 Hypotension   ? Major depressive disorder 10/01/2020  ? Neuropathy   ? OSA on CPAP 10/01/2020  ? Pacemaker   ? Peripheral polyneuropathy 10/01/2020  ? Presence of cardiac pacemaker 10/01/2020  ? 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  Watchman FLX 20  ? PTSD (post-traumatic stress disorder) 10/01/2020  ? ? ?Past Surgical History:  ?Procedure Laterality Date  ? INTRAMEDULLARY (IM) NAIL INTERTROCHANTERIC Right 05/15/2021  ? Procedure: INTRAMEDULLARY (IM) NAIL INTERTROCHANTRIC;  Surgeon: Ernestina ColumbiaLooney, Austin, MD;  Location: [facility] OR;  Service: Orthopedics;  Laterality: Right;  ? LEFT ATRIAL APPENDAGE OCCLUSION N/A 01/02/2021  ? Procedure: LEFT ATRIAL APPENDAGE OCCLUSION;  Surgeon: Lanier PrudeLambert, Teandre Hamre T, MD;  [facility];  Service: Cardiovascular;  Laterality: N/A;  ? TEE WITHOUT CARDIOVERSION N/A 01/02/2021  ? Procedure: TRANSESOPHAGEAL ECHOCARDIOGRAM (TEE);  Surgeon: Lanier PrudeLambert, Joaopedro Eschbach T, MD;  [facility];  Service: Cardiovascular;  Laterality: N/A;  ? TEE WITHOUT CARDIOVERSION N/A 02/17/2021  ? Procedure: TRANSESOPHAGEAL ECHOCARDIOGRAM (TEE);  Surgeon: Sande Rives'Neal, Chesterhill Thomas, MD;  [facility];  Service: Cardiovascular;  Laterality: N/A;  ? ? ?Current Medications: ?No outpatient medications have been marked as taking for the 07/29/21 encounter (Office Visit) with Lanier PrudeLambert, Alexsis Kathman T, MD.  ?  ? ?Allergies:   Amphetamine, Diazepam, Zoloft [sertraline], Gabapentin, Gluten meal, and Penicillin g  ? ?Social History  ? ?Socioeconomic History  ? Marital status: Married  ?  Spouse name: Not on file  ? Number of children: 3  ? Years of education: Not on file  ? Highest education level: Not on file  ?Occupational History  ? Occupation: Retired  ?Tobacco Use  ? Smoking status: Former  ?  Types: Cigarettes  ? Smokeless tobacco: Never  ?Vaping Use  ? Vaping Use: Not on file  ?Substance and Sexual Activity  ? Alcohol use: Not Currently  ? Drug use: Never  ? Sexual activity: Yes  ?Other Topics Concern  ? Not on file  ?Social History Narrative  ? Not on file  ? ?Social Determinants of Health  ? ?Financial Resource Strain: Not on file  ?Food Insecurity: Not on file  ?Transportation Needs: Not on file  ?Physical Activity: Not on file   ?Stress: Not on file  ?Social Connections: Not on file  ?  ? ?Family History: ?The patient's family history includes Brain cancer in his sister; Heart attack in his mother; Ulcers in his father. There is no history of Heart disease. ? ?ROS:   ?Please see the history of present illness.    ?(+) Dizziness ?(+) Weight loss ?All other systems reviewed and are negative. ? ?EKGs/Labs/Other Studies Reviewed:   ? ?The following studies were reviewed today: ? ?05/21/2021  Bilateral LE Venous Doppler ?Summary:  ?RIGHT:  ?- There is no evidence of deep vein thrombosis in the lower extremity.  ?However, portions of this examination were limited- see technologist  ?comments above.  ?   ?- No cystic structure found in the popliteal fossa.  ?   ?LEFT:  ?- There is no evidence of deep vein thrombosis in the lower extremity.  ?   ?- No cystic structure found in the popliteal fossa.  ? ?02/17/2021  Echo ? 1. 20 mm Watchman FLX is present in the LAA. Max diameter 18 mm (10%  ?compression). There is a small leak 2-3 mm in the inferior aspect of the  ?device. There is also considerable shoulder in this region of the device.  ?Otherwise, the device is well-seated.  ? 2. Left ventricular ejection fraction, by estimation, is 60 to 65%. The  ?left ventricle has normal function.  ? 3. A device lead is seen in the RA/RV. There appears to be a small mobile  ?mass attached to the lead. This either represents a small thrombus or is  ?the eustachian valve coming in and out of plane. Right ventricular  ?systolic function is mildly reduced.  ?The right ventricular size is severely enlarged.  ? 4. 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 3D VCA  ?0.38 cm2. The septal leaflet appears impinged by the pacer lead. The  ?tricuspid valve is abnormal. Tricuspid valve regurgitation is moderate to  ?severe.  ? 5. Left atrial size was severely dilated. No left atrial/left atrial  ?appendage thrombus was detected.  ? 6. Right atrial size was  severely dilated.  ? 7. The mitral valve is grossly normal. Mild to moderate mitral valve  ?regurgitation. No evidence of mitral stenosis.  ? 8. The aortic valve is tricuspid. Aortic valve regurgitation is trivial.  ?No aortic stenosis is present.  ? 9. There is mild (Grade II) layered plaque involving the descending  ?aorta.  ?10. Evidence of atrial level shunting detected by color flow Doppler.  ?There is a small patent foramen ovale with predominantly left to right  ?shunting across the atrial septum.  ? ?01/02/2021  Left Atrial Appendage Occlusion ?CONCLUSIONS:  ?1.Successful implantation of a WATCHMAN left atrial appendage occlusive device    ?2. TEE demonstrating no LAA thrombus ?3. No early apparent complications.  ? ?November 12, 2020 CT appendage protocol ?IMPRESSION: ?1. The left atrial appendage appears windsock in morphology. There ?is systolic filling defect in the left atrial  appendage that is not ?present in delayed sequence. This is not suggestive of thrombus. ? 2. A 31 mm watchman FLX device is recommended based on the above ?landing zone measurements (25 mm maximum diameter; 20 % ?compression). ? 3. An mid-posterior IAS puncture site is recommended. ? 4. Optimal deployment angle: RAO 32 CRA 22 ? 5. Normal coronary origin. Left dominance. ? 6. Coronary artery calcium score 761, 77th for age, sex, and race ?matched control. ? ?EKG:  EKG is personally reviewed.  ?07/29/2021: EKG was not ordered. ?05/14/2021 (ED): Accelerated junctional rhythm. Nonspecific intraventricular conduction delay. Anterolateral infarct, age indeterminate. No significant change since last tracing. ?12/03/2020: Ventricular pacing and atrial fibrillation. ? ?Recent Labs: ?11/09/2020: Magnesium 1.9 ?05/16/2021: TSH 0.313 ?05/21/2021: ALT 10 ?05/27/2021: Hemoglobin 8.9; Platelets 267 ?05/30/2021: BUN 17; Creatinine, Ser 0.59; Potassium 4.2; Sodium 141  ? ?Recent Lipid Panel ?   ?Component Value Date/Time  ? CHOL 108 10/02/2020 0301  ? TRIG 27  10/02/2020 0301  ? HDL 48 10/02/2020 0301  ? CHOLHDL 2.3 10/02/2020 0301  ? VLDL 5 10/02/2020 0301  ? LDLCALC 55 10/02/2020 0301  ? ? ?Physical Exam:   ? ?VS:  BP 106/66   Pulse 83   Ht 5\' 10"  (1.778 m)   Wt 165

## 2021-12-04 ENCOUNTER — Encounter (HOSPITAL_COMMUNITY): Payer: Self-pay

## 2021-12-04 ENCOUNTER — Other Ambulatory Visit: Payer: Self-pay

## 2021-12-04 ENCOUNTER — Emergency Department (HOSPITAL_COMMUNITY): Payer: No Typology Code available for payment source

## 2021-12-04 ENCOUNTER — Emergency Department (HOSPITAL_COMMUNITY)
Admission: EM | Admit: 2021-12-04 | Discharge: 2021-12-04 | Disposition: A | Discharge status: Home / Self Care | Payer: No Typology Code available for payment source | Attending: Emergency Medicine | Admitting: Emergency Medicine

## 2021-12-04 DIAGNOSIS — M25531 Pain in right wrist: Secondary | ICD-10-CM | POA: Diagnosis not present

## 2021-12-04 DIAGNOSIS — S0990XA Unspecified injury of head, initial encounter: Secondary | ICD-10-CM | POA: Diagnosis present

## 2021-12-04 DIAGNOSIS — S0083XA Contusion of other part of head, initial encounter: Secondary | ICD-10-CM | POA: Insufficient documentation

## 2021-12-04 DIAGNOSIS — Z7982 Long term (current) use of aspirin: Secondary | ICD-10-CM | POA: Diagnosis not present

## 2021-12-04 DIAGNOSIS — W06XXXA Fall from bed, initial encounter: Secondary | ICD-10-CM | POA: Insufficient documentation

## 2021-12-04 NOTE — ED Triage Notes (Signed)
Patient fell out of bed on head 1 week ago has large hematoma but [facility] an wants [facility] rule out for orbit fx due to change in extraocular eye movement in left eye.  No thinners other than asa.  Patient did this due to having a nightmare.

## 2021-12-04 NOTE — ED Provider Notes (Signed)
[facility] EMERGENCY DEPARTMENT Provider Note   CSN: [id number] Arrival date & time: 12/04/21  1509     History  Chief Complaint  Patient presents with   Head Injury    Jack Neal is a 76 y.o. male.  76 year old male who presents with facial injury after falling off about a week ago.  Does take aspirin daily but denies any other blood thinner use.  Has had a mild headache since then without emesis.  Denies any neck discomfort.  Did have some transient extremity pain which is since resolved.  Was seen by the [facility] hospital system and there were concern for possible orbital fracture.  Patient denies any diplopia at this time.  States he otherwise feels at his baseline       Home Medications Prior to Admission medications   Medication Sig Start Date End Date Taking? Authorizing Provider  acetaminophen (TYLENOL) 325 MG tablet Take 1-2 tablets (325-650 mg total) by mouth every 6 (six) hours as needed for mild pain (pain score 1-3 or temp > 100.5). 06/03/21   Angiulli, Lavon Paganini, PA-C  aspirin EC 81 MG tablet Take 1 tablet (81 mg total) by mouth daily. Swallow whole. 12/03/20   Vickie Epley, MD  bumetanide (BUMEX) 2 MG tablet Take 4 mg by mouth daily as needed (edema).    [provider]  buprenorphine-naloxone (SUBOXONE) 2-0.5 mg SUBL SL tablet Place 1 tablet under the tongue daily. 01/11/20   [provider]  calcium elemental as carbonate (TUMS ULTRA 1000) 400 MG chewable tablet Chew 1,000 mg by mouth daily as needed for heartburn.    [provider]  Carboxymethylcellulose Sodium (ARTIFICIAL TEARS OP) Place 1 drop into both eyes 3 (three) times daily as needed (dry eyes).    [provider]  Cholecalciferol (VITAMIN D) 50 MCG (2000 UT) tablet Take 2,000 Units by mouth 2 (two) times daily.    [provider]  Cyanocobalamin (VITAMIN B-12 PO) Take 6 mcg by mouth in the morning and at bedtime.    [provider]   diclofenac Sodium (VOLTAREN) 1 % GEL Apply 2 g topically daily. For feet pain 07/03/20   [provider]  DULoxetine (CYMBALTA) 30 MG capsule Take 30-60 mg by mouth See admin instructions. 60 mg in the morning  30 mg at bedtime    [provider]  ferrous sulfate 325 (65 FE) MG tablet Take 325 mg by mouth every Monday, Wednesday, and Friday. 07/18/19   [provider]  fluticasone (FLONASE) 50 MCG/ACT nasal spray Place 2 sprays into both nostrils daily as needed for allergies or rhinitis.    [provider]  galantamine (RAZADYNE ER) 8 MG 24 hr capsule Take 16 mg by mouth at bedtime. 03/28/21   [provider]  lidocaine (LIDODERM) 5 % Place 2 patches onto the skin daily. Remove & Discard patch within 12 hours or as directed by MD 06/03/21   Angiulli, Lavon Paganini, PA-C  Lidocaine HCl 4 % CREA Apply 1 g topically daily as needed (pain). [facility]    [provider]  MAGNESIUM PO Take 375 mg by mouth in the morning and at bedtime.    [provider]  memantine (NAMENDA) 10 MG tablet Take 10-20 mg by mouth See admin instructions. 10 mg in the morning  20 mg at bedtime 03/28/21   [provider]  methocarbamol (ROBAXIN) 500 MG tablet Take 1 tablet (500 mg total) by mouth every 6 (six)  hours as needed for muscle spasms. 06/03/21   Angiulli, Lavon Paganini, PA-C  Multiple Vitamin (MULTIVITAMIN) capsule Take 1 capsule by mouth daily.    [provider]  NON FORMULARY CPAP at bedtime    [provider]  Pancrelipase, Lip-Prot-Amyl, (CREON PO) Take 36,000 Units by mouth 3 (three) times daily with meals.    [provider]  pantoprazole (PROTONIX) 40 MG tablet Take 1 tablet (40 mg total) by mouth daily. Patient not taking: Reported on 05/15/2021 01/04/21   Kathyrn Drown D, NP  POTASSIUM PO Take 450 mg by mouth in the morning and at bedtime.    [provider]  prazosin (MINIPRESS) 1 MG capsule Take 2 mg by mouth at  bedtime. 04/08/21   [provider]  Probiotic CHEW Chew 3 capsules by mouth daily.    [provider]  Simethicone 180 MG CAPS Take 180 mg by mouth daily as needed (gas).    [provider]  sodium chloride (OCEAN) 0.65 % SOLN nasal spray Place 1 spray into both nostrils at bedtime.    [provider]  traZODone (DESYREL) 50 MG tablet Take 1 tablet (50 mg total) by mouth at bedtime. 06/03/21   Angiulli, Lavon Paganini, PA-C  VITAMIN A PO Take 4,000 Units by mouth daily.    [provider]  vitamin C (ASCORBIC ACID) 250 MG tablet Take 250 mg by mouth 2 (two) times daily.    [provider]  zinc sulfate 220 (50 Zn) MG capsule Take 220 mg by mouth daily. 04/10/21   [provider]      Allergies    Amphetamine, Diazepam, Zoloft [sertraline], Gabapentin, Gluten meal, and Penicillin g    Review of Systems   Review of Systems  All other systems reviewed and are negative.   Physical Exam Updated Vital Signs BP 130/84 (BP Location: Right Arm)   Pulse 81   Temp 98 F (36.7 C) (Oral)   Resp 16   Ht 1.778 m (5\' 10" )   Wt 74.8 kg   SpO2 99%   BMI 23.68 kg/m  Physical Exam Vitals and nursing note reviewed.  Constitutional:      General: He is not in acute distress.    Appearance: Normal appearance. He is well-developed. He is not toxic-appearing.  HENT:     Head: Normocephalic and atraumatic.   Eyes:     General: Lids are normal.     Conjunctiva/sclera: Conjunctivae normal.     Pupils: Pupils are equal, round, and reactive to light.  Neck:     Thyroid: No thyroid mass.     Trachea: No tracheal deviation.  Cardiovascular:     Rate and Rhythm: Normal rate and regular rhythm.     Heart sounds: Normal heart sounds. No murmur heard.    No gallop.  Pulmonary:     Effort: Pulmonary effort is normal. No respiratory distress.     Breath sounds: Normal breath sounds. No stridor. No decreased breath sounds, wheezing, rhonchi or rales.   Abdominal:     General: There is no distension.     Palpations: Abdomen is soft.     Tenderness: There is no abdominal tenderness. There is no rebound.  Musculoskeletal:        General: No tenderness. Normal range of motion.     Cervical back: Normal range of motion and neck supple.  Skin:    General: Skin is warm and dry.     Findings: No abrasion or  rash.  Neurological:     General: No focal deficit present.     Mental Status: He is alert and oriented to person, place, and time. Mental status is at baseline.     GCS: GCS eye subscore is 4. GCS verbal subscore is 5. GCS motor subscore is 6.     Cranial Nerves: No cranial nerve deficit.     Sensory: No sensory deficit.     Motor: Motor function is intact.  Psychiatric:        Attention and Perception: Attention normal.        Speech: Speech normal.        Behavior: Behavior normal.     ED Results / Procedures / Treatments   Labs (all labs ordered are listed, but only abnormal results are displayed) Labs Reviewed - No data to display  EKG None  Radiology CT Head Wo Contrast  Result Date: 12/04/2021 CLINICAL DATA:  Head and neck trauma concern for fall 10 days ago. EXAM: CT HEAD WITHOUT CONTRAST CT CERVICAL SPINE WITHOUT CONTRAST TECHNIQUE: Multidetector CT imaging of the head and cervical spine was performed following the standard protocol without intravenous contrast. Multiplanar CT image reconstructions of the cervical spine were also generated. RADIATION DOSE REDUCTION: This exam was performed according to the departmental dose-optimization program which includes automated exposure control, adjustment of the mA and/or kV according to patient size and/or use of iterative reconstruction technique. COMPARISON:  None Available. FINDINGS: CT HEAD FINDINGS Brain: No evidence of acute infarction, hemorrhage, hydrocephalus, extra-axial collection or mass lesion/mass effect. Vascular: No hyperdense vessel or unexpected calcification.  Skull: Right frontal scalp swelling without evidence of calvarial fracture. Negative for fracture or focal lesion. Sinuses/Orbits: No acute finding. Other: None. CT CERVICAL SPINE FINDINGS Alignment: Straightening of the cervical spine and mild dextroscoliosis. Mild anterolisthesis of C3. Skull base and vertebrae: No acute fracture. Disc height loss at C4-C5 with partial interbody fusion. Soft tissues and spinal canal: No prevertebral fluid or swelling. No visible canal hematoma. Disc levels: C2-C3:  No significant findings. C3-C4: Mild left facet joint arthropathy and left neural foraminal stenosis. C4-C5:  Disc height loss and partial interbody fusion on the left. C5-C6:  Disc height loss and uncovertebral joint arthropathy. C6-C7:  Disc height loss and uncovertebral joint arthropathy. C7-T1:  No significant finding. Upper chest: Negative. Other: None IMPRESSION: CT head: 1. No acute intracranial abnormality. 2. Right frontal scalp swelling without evidence of calvarial fracture. CT cervical spine: 1. No acute fracture or traumatic subluxation. 2. 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 Disc height loss and uncovertebral joint arthropathy at C5-C6 and C6-C7. 3. Paraspinal soft tissues are unremarkable. 4. Osteopenia. Electronically Signed   By: Keane Police D.O.   On: 12/04/2021 17:15   CT Cervical Spine Wo Contrast  Result Date: 12/04/2021 CLINICAL DATA:  Head and neck trauma concern for fall 10 days ago. EXAM: CT HEAD WITHOUT CONTRAST CT CERVICAL SPINE WITHOUT CONTRAST TECHNIQUE: Multidetector CT imaging of the head and cervical spine was performed following the standard protocol without intravenous contrast. Multiplanar CT image reconstructions of the cervical spine were also generated. RADIATION DOSE REDUCTION: This exam was performed according to the departmental dose-optimization program which includes automated exposure control, adjustment of the mA and/or kV according to patient  size and/or use of iterative reconstruction technique. COMPARISON:  None Available. FINDINGS: CT HEAD FINDINGS Brain: No evidence of acute infarction, hemorrhage, hydrocephalus, extra-axial collection or mass lesion/mass effect. Vascular: No hyperdense vessel or unexpected  calcification. Skull: Right frontal scalp swelling without evidence of calvarial fracture. Negative for fracture or focal lesion. Sinuses/Orbits: No acute finding. Other: None. CT CERVICAL SPINE FINDINGS Alignment: Straightening of the cervical spine and mild dextroscoliosis. Mild anterolisthesis of C3. Skull base and vertebrae: No acute fracture. Disc height loss at C4-C5 with partial interbody fusion. Soft tissues and spinal canal: No prevertebral fluid or swelling. No visible canal hematoma. Disc levels: C2-C3:  No significant findings. C3-C4: Mild left facet joint arthropathy and left neural foraminal stenosis. C4-C5:  Disc height loss and partial interbody fusion on the left. C5-C6:  Disc height loss and uncovertebral joint arthropathy. C6-C7:  Disc height loss and uncovertebral joint arthropathy. C7-T1:  No significant finding. Upper chest: Negative. Other: None IMPRESSION: CT head: 1. No acute intracranial abnormality. 2. Right frontal scalp swelling without evidence of calvarial fracture. CT cervical spine: 1. No acute fracture or traumatic subluxation. 2. 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 Disc height loss and uncovertebral joint arthropathy at C5-C6 and C6-C7. 3. Paraspinal soft tissues are unremarkable. 4. Osteopenia. Electronically Signed   By: Keane Police D.O.   On: 12/04/2021 17:15   CT Orbits Wo Contrast  Result Date: 12/04/2021 CLINICAL DATA:  Trauma, fall, pain EXAM: CT ORBITS WITHOUT CONTRAST TECHNIQUE: Multidetector CT imaging of the orbits was performed using the standard protocol without intravenous contrast. Multiplanar CT image reconstructions were also generated. RADIATION DOSE REDUCTION:  This exam was performed according to the departmental dose-optimization program which includes automated exposure control, adjustment of the mA and/or kV according to patient size and/or use of iterative reconstruction technique. COMPARISON:  None Available. FINDINGS: Orbits: Optic globes are symmetrical. Retrobulbar soft tissues are unremarkable. Visible paranasal sinuses: There are no air-fluid levels There is mild mucosal thickening in ethmoid sinus. Soft tissues: There is subcutaneous stranding in the left side of face and right periventricular region. There is no demonstrable large focal hematoma. Osseous: No displaced fractures are seen. Limited intracranial: Unremarkable. IMPRESSION: No recent fracture is seen in facial bones and orbits. Optic globes are symmetrical. Retrobulbar soft tissues are unremarkable. No displaced fractures are seen. Mild chronic sinusitis. There is subcutaneous contusion in right periorbital region and left side of face. There are no opaque foreign bodies. Electronically Signed   By: Elmer Picker M.D.   On: 12/04/2021 17:14    Procedures Procedures    Medications Ordered in ED Medications - No data to display  ED Course/ Medical Decision Making/ A&P                           Medical Decision Making  CT of head, C-spine, maxillofacial per my review and interpretation not show any acute injuries.  Patient does have resolving ecchymosis noted on his forehead.  No indication for imaging of any of his extremities.  Did have some transient right wrist pain but has full range of motion at this time.  Low suspicion for fracture.  Will discharge home        Final Clinical Impression(s) / ED Diagnoses Final diagnoses:  None    Rx / DC Orders ED Discharge Orders     None         Lacretia Leigh, MD 12/04/21 [id number]

## 2021-12-04 NOTE — ED Provider Triage Note (Signed)
Emergency Medicine Provider Triage Evaluation Note  Cheyenne Schumm , a 76 y.o. male  was evaluated in triage.  Pt complains of concern for fall onset 10 days.  Denies anticoagulant use.  Was evaluated at the [facility] today and sent here for CT scan.  No meds tried prior to arrival.  He presented to the [place] due to a worsening headache.  Denies chest pain, shortness of breath, numbness, tingling, weakness, double vision, blurred vision, nausea, vomiting.  Review of Systems  Positive:  Negative:   Physical Exam  BP 124/75 (BP Location: Right Arm)   Pulse 80   Temp 98 F (36.7 C) (Oral)   Resp 16   SpO2 100%  Gen:   Awake, no distress   Resp:  Normal effort  MSK:   Moves extremities without difficulty  Other:  Area of swelling noted to right anterior forehead, overlying skin changes.  No tenderness to palpation noted to maxillary sinuses.  PERRL.  EOMI.  Bruising noted to head.   Medical Decision Making  Medically screening exam initiated at 4:19 PM.  Appropriate orders placed.  Kimble Hitchens was informed that the remainder of the evaluation will be completed by another provider, this initial triage assessment does not replace that evaluation, and the importance of remaining in the ED until their evaluation is complete.  Work-up initiated   Lisaanne Lawrie A, PA-C 12/04/21 1620

## 2021-12-17 ENCOUNTER — Telehealth: Payer: Self-pay

## 2021-12-17 NOTE — Telephone Encounter (Signed)
Called to check in with patient, who had [place] on 01/02/2021. The patient reported he is doing well with no issues and stated "getting the Watchman was the best thing since sliced bread!" He has follow-up with his [place] cardiologist in December 2023. He understands to call with questions or concerns.

## 2022-03-02 ENCOUNTER — Emergency Department (HOSPITAL_COMMUNITY): Payer: No Typology Code available for payment source

## 2022-03-02 ENCOUNTER — Inpatient Hospital Stay (HOSPITAL_COMMUNITY)
Admission: EM | Admit: 2022-03-02 | Discharge: 2022-03-10 | DRG: 206 | Disposition: A | Discharge status: Home Health | Payer: No Typology Code available for payment source | Attending: General Surgery | Admitting: General Surgery

## 2022-03-02 DIAGNOSIS — D62 Acute posthemorrhagic anemia: Secondary | ICD-10-CM | POA: Diagnosis present

## 2022-03-02 DIAGNOSIS — Z950 Presence of cardiac pacemaker: Secondary | ICD-10-CM

## 2022-03-02 DIAGNOSIS — Z8249 Family history of ischemic heart disease and other diseases of the circulatory system: Secondary | ICD-10-CM

## 2022-03-02 DIAGNOSIS — S301XXA Contusion of abdominal wall, initial encounter: Secondary | ICD-10-CM | POA: Diagnosis present

## 2022-03-02 DIAGNOSIS — S5292XA Unspecified fracture of left forearm, initial encounter for closed fracture: Secondary | ICD-10-CM | POA: Diagnosis present

## 2022-03-02 DIAGNOSIS — J90 Pleural effusion, not elsewhere classified: Secondary | ICD-10-CM | POA: Diagnosis present

## 2022-03-02 DIAGNOSIS — S62102A Fracture of unspecified carpal bone, left wrist, initial encounter for closed fracture: Secondary | ICD-10-CM

## 2022-03-02 DIAGNOSIS — I480 Paroxysmal atrial fibrillation: Secondary | ICD-10-CM | POA: Diagnosis present

## 2022-03-02 DIAGNOSIS — Z880 Allergy status to penicillin: Secondary | ICD-10-CM

## 2022-03-02 DIAGNOSIS — F32A Depression, unspecified: Secondary | ICD-10-CM | POA: Diagnosis present

## 2022-03-02 DIAGNOSIS — Z91018 Allergy to other foods: Secondary | ICD-10-CM

## 2022-03-02 DIAGNOSIS — S2231XA Fracture of one rib, right side, initial encounter for closed fracture: Secondary | ICD-10-CM | POA: Diagnosis not present

## 2022-03-02 DIAGNOSIS — Z888 Allergy status to other drugs, medicaments and biological substances status: Secondary | ICD-10-CM

## 2022-03-02 DIAGNOSIS — G894 Chronic pain syndrome: Secondary | ICD-10-CM | POA: Diagnosis present

## 2022-03-02 DIAGNOSIS — Z9884 Bariatric surgery status: Secondary | ICD-10-CM

## 2022-03-02 DIAGNOSIS — Z808 Family history of malignant neoplasm of other organs or systems: Secondary | ICD-10-CM

## 2022-03-02 DIAGNOSIS — K8689 Other specified diseases of pancreas: Secondary | ICD-10-CM | POA: Diagnosis present

## 2022-03-02 DIAGNOSIS — W19XXXA Unspecified fall, initial encounter: Principal | ICD-10-CM

## 2022-03-02 DIAGNOSIS — G4733 Obstructive sleep apnea (adult) (pediatric): Secondary | ICD-10-CM | POA: Diagnosis present

## 2022-03-02 DIAGNOSIS — K219 Gastro-esophageal reflux disease without esophagitis: Secondary | ICD-10-CM | POA: Diagnosis present

## 2022-03-02 DIAGNOSIS — F410 Panic disorder [episodic paroxysmal anxiety] without agoraphobia: Secondary | ICD-10-CM | POA: Diagnosis present

## 2022-03-02 DIAGNOSIS — Z87891 Personal history of nicotine dependence: Secondary | ICD-10-CM

## 2022-03-02 DIAGNOSIS — K900 Celiac disease: Secondary | ICD-10-CM | POA: Diagnosis present

## 2022-03-02 DIAGNOSIS — F4310 Post-traumatic stress disorder, unspecified: Secondary | ICD-10-CM | POA: Diagnosis present

## 2022-03-02 DIAGNOSIS — M25552 Pain in left hip: Secondary | ICD-10-CM | POA: Diagnosis present

## 2022-03-02 DIAGNOSIS — S2239XA Fracture of one rib, unspecified side, initial encounter for closed fracture: Secondary | ICD-10-CM | POA: Diagnosis not present

## 2022-03-02 DIAGNOSIS — G629 Polyneuropathy, unspecified: Secondary | ICD-10-CM | POA: Diagnosis present

## 2022-03-02 LAB — BRAIN NATRIURETIC PEPTIDE: B Natriuretic Peptide: 186.4 pg/mL — ABNORMAL HIGH (ref 0.0–100.0)

## 2022-03-02 LAB — CBC WITH DIFFERENTIAL/PLATELET
Abs Immature Granulocytes: 0.01 10*3/uL (ref 0.00–0.07)
Basophils Absolute: 0 10*3/uL (ref 0.0–0.1)
Basophils Relative: 1 %
Eosinophils Absolute: 0.1 10*3/uL (ref 0.0–0.5)
Eosinophils Relative: 2 %
HCT: 31.8 % — ABNORMAL LOW (ref 39.0–52.0)
Hemoglobin: 9.7 g/dL — ABNORMAL LOW (ref 13.0–17.0)
Immature Granulocytes: 0 %
Lymphocytes Relative: 15 %
Lymphs Abs: 0.7 10*3/uL (ref 0.7–4.0)
MCH: 34.9 pg — ABNORMAL HIGH (ref 26.0–34.0)
MCHC: 30.5 g/dL (ref 30.0–36.0)
MCV: 114.4 fL — ABNORMAL HIGH (ref 80.0–100.0)
Monocytes Absolute: 0.5 10*3/uL (ref 0.1–1.0)
Monocytes Relative: 11 %
Neutro Abs: 3.5 10*3/uL (ref 1.7–7.7)
Neutrophils Relative %: 71 %
Platelets: 165 10*3/uL (ref 150–400)
RBC: 2.78 MIL/uL — ABNORMAL LOW (ref 4.22–5.81)
RDW: 14.3 % (ref 11.5–15.5)
WBC: 4.9 10*3/uL (ref 4.0–10.5)
nRBC: 0 % (ref 0.0–0.2)

## 2022-03-02 LAB — I-STAT CHEM 8, ED
BUN: 12 mg/dL (ref 8–23)
Calcium, Ion: 1.09 mmol/L — ABNORMAL LOW (ref 1.15–1.40)
Chloride: 93 mmol/L — ABNORMAL LOW (ref 98–111)
Creatinine, Ser: 0.6 mg/dL — ABNORMAL LOW (ref 0.61–1.24)
Glucose, Bld: 89 mg/dL (ref 70–99)
HCT: 29 % — ABNORMAL LOW (ref 39.0–52.0)
Hemoglobin: 9.9 g/dL — ABNORMAL LOW (ref 13.0–17.0)
Potassium: 3.4 mmol/L — ABNORMAL LOW (ref 3.5–5.1)
Sodium: 139 mmol/L (ref 135–145)
TCO2: 32 mmol/L (ref 22–32)

## 2022-03-02 MED ORDER — OXYCODONE HCL 5 MG PO TABS: 2.5000 mg | ORAL_TABLET | ORAL | Status: DC | PRN

## 2022-03-02 MED ORDER — ONDANSETRON HCL 4 MG/2ML IJ SOLN: 4.0000 mg | Freq: Four times a day (QID) | INTRAMUSCULAR | Status: DC | PRN

## 2022-03-02 MED ORDER — DOCUSATE SODIUM 100 MG PO CAPS
100.0000 mg | ORAL_CAPSULE | Freq: Two times a day (BID) | ORAL | Status: DC
  Administered 2022-03-02 – 2022-03-10 (×14): 100 mg via ORAL
  Filled 2022-03-02 (×17): qty 1

## 2022-03-02 MED ORDER — METHOCARBAMOL 500 MG PO TABS
1000.0000 mg | ORAL_TABLET | Freq: Three times a day (TID) | ORAL | Status: DC
  Administered 2022-03-02 – 2022-03-10 (×22): 1000 mg via ORAL
  Filled 2022-03-02 (×22): qty 2

## 2022-03-02 MED ORDER — ONDANSETRON 4 MG PO TBDP: 4.0000 mg | ORAL_TABLET | Freq: Four times a day (QID) | ORAL | Status: DC | PRN

## 2022-03-02 MED ORDER — LIDOCAINE 5 % EX PTCH
1.0000 | MEDICATED_PATCH | CUTANEOUS | Status: DC
  Administered 2022-03-02 – 2022-03-09 (×7): 1 via TRANSDERMAL
  Filled 2022-03-02 (×10): qty 1

## 2022-03-02 MED ORDER — ACETAMINOPHEN 500 MG PO TABS
1000.0000 mg | ORAL_TABLET | Freq: Four times a day (QID) | ORAL | Status: DC
  Administered 2022-03-02 – 2022-03-10 (×30): 1000 mg via ORAL
  Filled 2022-03-02 (×31): qty 2

## 2022-03-02 MED ORDER — TRAMADOL HCL 50 MG PO TABS
50.0000 mg | ORAL_TABLET | Freq: Once | ORAL | Status: AC
  Administered 2022-03-02: 50 mg via ORAL
  Filled 2022-03-02: qty 1

## 2022-03-02 MED ORDER — ENOXAPARIN SODIUM 30 MG/0.3ML IJ SOSY
30.0000 mg | PREFILLED_SYRINGE | Freq: Two times a day (BID) | INTRAMUSCULAR | Status: DC
  Filled 2022-03-02 (×2): qty 0.3

## 2022-03-02 NOTE — ED Notes (Signed)
Patient transported to X-ray 

## 2022-03-02 NOTE — Progress Notes (Signed)
Orthopedic Tech Progress Note Patient Details:  Jack Neal 07/06/1945 [id number]  Ortho Devices Type of Ortho Device: Cotton web roll, Ace wrap, Sugartong splint Ortho Device/Splint Location: LUE Ortho Device/Splint Interventions: Ordered, Application, Adjustment   Post Interventions Patient Tolerated: Well Instructions Provided: Care of device  [name] 03/02/2022, 4:51 PM

## 2022-03-02 NOTE — ED Triage Notes (Signed)
Patient arrived to the ED via EMS due to a fall. Patient states he was using his rollater to walk outside when it got stuck causing him to fall forward over it. EMS states left wrist deformity, left hip pain, and chest wall pain. Patient did not hit head and is not on blood thinners. Patient states he had a headache and was given tylenol, but has not helped so far. Patient in C-collar and left wrist is splinted. Patient is A&O x 4.

## 2022-03-02 NOTE — ED Provider Notes (Signed)
Seen after prior EDP.  Patient with fractures of the left distal radius and right 5th rib identified on imaging.  Walks with a rollator at baseline.  Patient is having significant difficulty standing much less ambulating with his injuries from today's fall.  Patient with significant fall risk if discharged home this evening.  Patient's case discussed with Dr. Bedelia Person covering trauma surgery.  Patient would benefit from admission.  Patient is agreeable with plan to stay.  Patient's wife updated regarding plan to admit.      Wynetta Fines, MD 03/02/22 1901

## 2022-03-02 NOTE — H&P (Signed)
Reason for Consult/Chief Complaint: GLF Consultant: Francia Greaves, MD  Jack Neal is an 76 y.o. male.   HPI: 23M s/p GLF. Was walking with rollator walker and the walker got caught on the curb. Landed on his left side. Did not hit his head or lose consciousness. Workmen at his home witnessed the fall and came to his assistance promptly. Report L hip pain.   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   Watchman FLX 20   PTSD (post-traumatic stress disorder) 10/01/2020    Past Surgical History:  Procedure Laterality Date   INTRAMEDULLARY (IM) NAIL INTERTROCHANTERIC Right 05/15/2021   Procedure: INTRAMEDULLARY (IM) NAIL INTERTROCHANTRIC;  Surgeon: Georgeanna Harrison, MD;  Location: [facility];  Service: Orthopedics;  Laterality: Right;   LEFT ATRIAL APPENDAGE OCCLUSION N/A 01/02/2021   Procedure: LEFT ATRIAL APPENDAGE OCCLUSION;  Surgeon: Vickie Epley, MD;  Location: [facility];  Service: Cardiovascular;  Laterality: N/A;   TEE WITHOUT CARDIOVERSION N/A 01/02/2021   Procedure: TRANSESOPHAGEAL ECHOCARDIOGRAM (TEE);  Surgeon: Vickie Epley, MD;  Location: [facility];  Service: Cardiovascular;  Laterality: N/A;   TEE WITHOUT CARDIOVERSION N/A 02/17/2021   Procedure: TRANSESOPHAGEAL ECHOCARDIOGRAM (TEE);  Surgeon: Geralynn Rile, MD;  Location: [facility];  Service: Cardiovascular;  Laterality: N/A;    Family History  Problem Relation Age of Onset   Heart attack Mother     Ulcers Father    Brain cancer Sister    Heart disease Neg Hx     Social History:  reports that he has quit smoking. His smoking use included cigarettes. He has never used smokeless tobacco. He reports that he does not currently use alcohol. He reports that he does not use drugs.  Allergies:  Allergies  Allergen Reactions   Amphetamine Rash and Other (See Comments)    Altered mental status- amnesia (also)   Diazepam Rash    Other reaction(s): Amnesia, Mental Status Changes (intolerance), Other amnesia    Zoloft [Sertraline] Anxiety, Rash and Other (See Comments)    Panic attacks and Cardiovascular Arrest (ALSO)    Gabapentin     Tremors/jerking, hallucinations, dry mouth   Gluten Meal Diarrhea   Penicillin G Rash    Medications: I have reviewed the patient's current medications.  Results for orders placed or performed during the hospital encounter of 03/02/22 (from the past 48 hour(s))  Brain natriuretic peptide     Status: Abnormal   Collection Time: 03/02/22  3:33 PM  Result Value Ref Range   B Natriuretic Peptide 186.4 (H) 0.0 - 100.0 pg/mL    Comment: Performed at [facility], [address], [place], [place] [address]  CBC with Differential     Status: Abnormal   Collection Time: 03/02/22  3:33 PM  Result Value Ref Range   WBC 4.9 4.0 - 10.5 K/uL   RBC 2.78 (L) 4.22 - 5.81 MIL/uL   Hemoglobin 9.7 (L) 13.0 - 17.0 g/dL   HCT 31.8 (  L) 39.0 - 52.0 %   MCV 114.4 (H) 80.0 - 100.0 fL   MCH 34.9 (H) 26.0 - 34.0 pg   MCHC 30.5 30.0 - 36.0 g/dL   RDW 14.3 11.5 - 15.5 %   Platelets 165 150 - 400 K/uL   nRBC 0.0 0.0 - 0.2 %   Neutrophils Relative % 71 %   Neutro Abs 3.5 1.7 - 7.7 K/uL   Lymphocytes Relative 15 %   Lymphs Abs 0.7 0.7 - 4.0 K/uL   Monocytes Relative 11 %   Monocytes Absolute 0.5 0.1 - 1.0 K/uL   Eosinophils Relative 2 %   Eosinophils Absolute 0.1 0.0 - 0.5 K/uL   Basophils Relative 1 %   Basophils Absolute 0.0 0.0 - 0.1 K/uL   Immature  Granulocytes 0 %   Abs Immature Granulocytes 0.01 0.00 - 0.07 K/uL    Comment: Performed at [facility] [address], [place], [place] [address]  I-stat chem 8, ED     Status: Abnormal   Collection Time: 03/02/22  6:02 PM  Result Value Ref Range   Sodium 139 135 - 145 mmol/L   Potassium 3.4 (L) 3.5 - 5.1 mmol/L   Chloride 93 (L) 98 - 111 mmol/L   BUN 12 8 - 23 mg/dL   Creatinine, Ser 0.60 (L) 0.61 - 1.24 mg/dL   Glucose, Bld 89 70 - 99 mg/dL    Comment: Glucose reference range applies only to samples taken after fasting for at least 8 hours.   Calcium, Ion 1.09 (L) 1.15 - 1.40 mmol/L   TCO2 32 22 - 32 mmol/L   Hemoglobin 9.9 (L) 13.0 - 17.0 g/dL   HCT 29.0 (L) 39.0 - 52.0 %    CT CHEST WO CONTRAST  Result Date: 03/02/2022 CLINICAL DATA:  Chest wall pain, nontraumatic, infection or inflammation suspected, xray done pleural effusion, poss pna +/- rib Fx? EXAM: CT CHEST WITHOUT CONTRAST TECHNIQUE: Multidetector CT imaging of the chest was performed following the standard protocol without IV contrast. RADIATION DOSE REDUCTION: This exam was performed according to the departmental dose-optimization program which includes automated exposure control, adjustment of the mA and/or kV according to patient size and/or use of iterative reconstruction technique. COMPARISON:  May 14, 2021. FINDINGS: Cardiovascular: Cardiomegaly. Trace pericardial fluid, mildly increased in comparison to prior and likely due to fluid overload. LEFT chest cardiac pacing device. Atrial appendage occlusion device. Three-vessel coronary artery atherosclerotic calcifications. Atherosclerotic calcifications of the nonaneurysmal thoracic aorta. Mediastinum/Nodes: Visualized thyroid is unremarkable. No axillary or mediastinal adenopathy. Calcified hilar and subcarinal lymph nodes consistent with sequela of prior granulomatous infection. Lungs/Pleura: Small LEFT pleural effusion. Increased RIGHT basilar centrilobular  nodularity. Scattered pulmonary nodules are similar in comparison to prior with representative RIGHT apical pulmonary nodule measuring 3 mm, unchanged (series 3, image 29) and RIGHT upper lobe 3 mm pulmonary nodules, unchanged (series 3, image 39 and 46). Bibasilar atelectasis. Upper Abdomen: Patulous fluid-filled esophagus. Status post Roux-en-Y. Musculoskeletal: Similar appearance of a sclerotic likely benign fibro-osseous lesion of the LEFT humerus. Nondisplaced rib fracture of the RIGHT lateral fifth rib. Age-indeterminate, favored remote rib fracture of the LEFT anterior second rib. Remote rib fractures of the LEFT fourth rib. Osteopenia. Anasarca. IMPRESSION: 1. Increased RIGHT basilar centrilobular nodularity, likely infectious or inflammatory in etiology. 2. Small LEFT pleural effusion. 3. Nondisplaced rib fracture of the RIGHT lateral fifth rib. 4. Age-indeterminate, favored remote rib fracture of the LEFT anterior second rib. Remote LEFT fourth rib fracture. 5. Patulous  fluid-filled esophagus. This places the patient at increased risk for aspiration. 6. Scattered pulmonary nodules measuring up to 3 mm are similar in comparison to prior. No follow-up needed if patient is low-risk (and has no known or suspected primary neoplasm). Non-contrast chest CT can be considered in 12 months if patient is high-risk. This recommendation follows the consensus statement: Guidelines for Management of Incidental Pulmonary Nodules Detected on CT Images: From the Fleischner Society 2017; Radiology 2017; 284:228-243. Aortic Atherosclerosis (ICD10-I70.0). Electronically Signed   By: Meda Klinefelter M.D.   On: 03/02/2022 17:08   DG Wrist Complete Left  Result Date: 03/02/2022 CLINICAL DATA:  Fall walking. EXAM: LEFT WRIST - COMPLETE 3+ VIEW COMPARISON:  None Available. FINDINGS: Ununited old ulnar styloid fracture. Mild degenerative change of the radiocarpal joint and first carpometacarpal joints. There is a minimally  displaced transverse fracture of the distal radial metaphysis. Remainder of the exam is unremarkable. IMPRESSION: Minimally displaced transverse fracture of the distal radial metaphysis. Electronically Signed   By: Elberta Fortis M.D.   On: 03/02/2022 14:50   DG Hip Unilat With Pelvis 2-3 Views Left  Result Date: 03/02/2022 CLINICAL DATA:  Fall EXAM: DG HIP (WITH OR WITHOUT PELVIS) 2-3V LEFT COMPARISON:  Pelvis radiographs 05/31/2018 FINDINGS: Postsurgical changes in the right femur are partially imaged. Irregularity of the proximal femur is consistent with the previously seen fracture. There is no evidence of new perihardware fracture, to the level imaged. There is no acute fracture or dislocation on the left. Femoroacetabular alignment is maintained. The SI joints and symphysis pubis are intact. The soft tissues are unremarkable. IMPRESSION: No acute fracture or dislocation. Electronically Signed   By: Lesia Hausen M.D.   On: 03/02/2022 14:47   CT Head Wo Contrast  Result Date: 03/02/2022 CLINICAL DATA:  Fall EXAM: CT HEAD WITHOUT CONTRAST CT CERVICAL SPINE WITHOUT CONTRAST TECHNIQUE: Multidetector CT imaging of the head and cervical spine was performed following the standard protocol without intravenous contrast. Multiplanar CT image reconstructions of the cervical spine were also generated. RADIATION DOSE REDUCTION: This exam was performed according to the departmental dose-optimization program which includes automated exposure control, adjustment of the mA and/or kV according to patient size and/or use of iterative reconstruction technique. COMPARISON:  CT head and cervical spine 12/04/2021 FINDINGS: CT HEAD FINDINGS Brain: There is no acute intracranial hemorrhage, extra-axial fluid collection, or acute infarct. Parenchymal volume is within expected limits for age. The ventricles are normal in size. Gray-white differentiation is preserved There is no mass lesion.  There is no mass effect or midline  shift. Vascular: No hyperdense vessel or unexpected calcification. Skull: Normal. Negative for fracture or focal lesion. Sinuses/Orbits: The paranasal sinuses are clear. Bilateral lens implants are in place. The globes and orbits are otherwise unremarkable. Other: Right frontal scalp swelling has nearly resolved. CT CERVICAL SPINE FINDINGS Alignment: Slight reversal of the normal cervical curvature with trace anterolisthesis of C4 on C5 is unchanged. There is no jumped or perched facet. There is no other evidence of acute traumatic malalignment. Skull base and vertebrae: Skull base alignment is maintained. Vertebral body heights are preserved. There is no evidence of acute fracture. There is no suspicious osseous lesion. There is unchanged partial fusion of the C4-C5 vertebral bodies. Soft tissues and spinal canal: No prevertebral fluid or swelling. No visible canal hematoma. Disc levels: Disc space narrowing degenerative endplate change most advanced at C4-C5 and C5-C6 is unchanged. There is overall relatively mild multilevel facet arthropathy, most advanced on the left  at C3-C4. There is unchanged moderate spinal canal stenosis at C3-C4. Upper chest: Is a left pleural effusion, incompletely imaged but new/increased since September. Other: None. IMPRESSION: 1. No acute intracranial pathology. 2. No acute fracture or traumatic malalignment of the cervical spine. 3. Incompletely imaged left pleural effusion, new/increased since September. Electronically Signed   By: Valetta Mole M.D.   On: 03/02/2022 14:42   CT Cervical Spine Wo Contrast  Result Date: 03/02/2022 CLINICAL DATA:  Fall EXAM: CT HEAD WITHOUT CONTRAST CT CERVICAL SPINE WITHOUT CONTRAST TECHNIQUE: Multidetector CT imaging of the head and cervical spine was performed following the standard protocol without intravenous contrast. Multiplanar CT image reconstructions of the cervical spine were also generated. RADIATION DOSE REDUCTION: This exam was  performed according to the departmental dose-optimization program which includes automated exposure control, adjustment of the mA and/or kV according to patient size and/or use of iterative reconstruction technique. COMPARISON:  CT head and cervical spine 12/04/2021 FINDINGS: CT HEAD FINDINGS Brain: There is no acute intracranial hemorrhage, extra-axial fluid collection, or acute infarct. Parenchymal volume is within expected limits for age. The ventricles are normal in size. Gray-white differentiation is preserved There is no mass lesion.  There is no mass effect or midline shift. Vascular: No hyperdense vessel or unexpected calcification. Skull: Normal. Negative for fracture or focal lesion. Sinuses/Orbits: The paranasal sinuses are clear. Bilateral lens implants are in place. The globes and orbits are otherwise unremarkable. Other: Right frontal scalp swelling has nearly resolved. CT CERVICAL SPINE FINDINGS Alignment: Slight reversal of the normal cervical curvature with trace anterolisthesis of C4 on C5 is unchanged. There is no jumped or perched facet. There is no other evidence of acute traumatic malalignment. Skull base and vertebrae: Skull base alignment is maintained. Vertebral body heights are preserved. There is no evidence of acute fracture. There is no suspicious osseous lesion. There is unchanged partial fusion of the C4-C5 vertebral bodies. Soft tissues and spinal canal: No prevertebral fluid or swelling. No visible canal hematoma. Disc levels: Disc space narrowing degenerative endplate change most advanced at C4-C5 and C5-C6 is unchanged. There is overall relatively mild multilevel facet arthropathy, most advanced on the left at C3-C4. There is unchanged moderate spinal canal stenosis at C3-C4. Upper chest: Is a left pleural effusion, incompletely imaged but new/increased since September. Other: None. IMPRESSION: 1. No acute intracranial pathology. 2. No acute fracture or traumatic malalignment of  the cervical spine. 3. Incompletely imaged left pleural effusion, new/increased since September. Electronically Signed   By: Valetta Mole M.D.   On: 03/02/2022 14:42    ROS 10 point review of systems is negative except as listed above in HPI.   Physical Exam Blood pressure 117/75, pulse 80, temperature (!) 97.3 F (36.3 C), temperature source Oral, resp. rate 18, height 5\' 10"  (1.778 m), weight 81.6 kg, SpO2 100 %. Constitutional: well-developed, well-nourished HEENT: pupils equal, round, reactive to light, 5mm b/l, moist conjunctiva, external inspection of ears and nose normal, hearing intact Oropharynx: normal oropharyngeal mucosa, normal dentition Neck: no thyromegaly, trachea midline, no midline cervical tenderness to palpation Chest: breath sounds equal bilaterally, normal respiratory effort, no midline or lateral chest wall tenderness to palpation/deformity Abdomen: soft, NT, + bruising L of umbo and at L hip, no hepatosplenomegaly GU: no blood at urethral meatus of penis, no scrotal masses or abnormality  Back: no wounds, no thoracic/lumbar spine tenderness to palpation, no thoracic/lumbar spine stepoffs Rectal: deferred Extremities: 2+ radial and pedal pulses bilaterally, intact motor and sensation bilateral  UE and LE, no peripheral edema MSK: unable to assess gait/station, no clubbing/cyanosis of fingers/toes, normal ROM of all four extremities Skin: warm, dry, no rashes Psych: normal memory, normal mood/affect     Assessment/Plan: 77M s/p GLF  Rib fx - pain control, IS, pulm toilet  L hip pain and bruising - XR hip negative, CT A/P to r/o occult fx L radius fx - pain control, hand surgery c/s FEN - regular diet DVT - SCDs, LMWH Dispo - admit to obs, PT/OT, resume home meds (most concerned about suboxone, diuretic)    Diamantina Monks, MD General and Trauma Surgery [facility]

## 2022-03-02 NOTE — ED Provider Notes (Signed)
[facility] EMERGENCY DEPARTMENT Provider Note   CSN: [id number] Arrival date & time: 03/02/22  1307     History {Add pertinent medical, surgical, social history, OB history to HPI:1} Chief Complaint  Patient presents with   Jack Neal is a 76 y.o. male.  HPI Patient presents after a fall with pain in multiple areas.  He arrives via EMS, is awake, alert.  He notes that he has increased frequency of falls, and just yesterday and was advised to stop taking aspirin due to poor risk-benefit analysis. Today, he fell over his rolling walker, going forward, no loss of consciousness, no new weakness in extremity.  He does have severe pain in his left wrist, less so in the left hip.  He has not been ambulatory since the event.    Home Medications Prior to Admission medications   Medication Sig Start Date End Date Taking? Authorizing Provider  acetaminophen (TYLENOL) 325 MG tablet Take 1-2 tablets (325-650 mg total) by mouth every 6 (six) hours as needed for mild pain (pain score 1-3 or temp > 100.5). 06/03/21   Angiulli, Lavon Paganini, PA-C  aspirin EC 81 MG tablet Take 1 tablet (81 mg total) by mouth daily. Swallow whole. 12/03/20   Vickie Epley, MD  bumetanide (BUMEX) 2 MG tablet Take 4 mg by mouth daily as needed (edema).    [provider]  buprenorphine-naloxone (SUBOXONE) 2-0.5 mg SUBL SL tablet Place 1 tablet under the tongue daily. 01/11/20   [provider]  calcium elemental as carbonate (TUMS ULTRA 1000) 400 MG chewable tablet Chew 1,000 mg by mouth daily as needed for heartburn.    [provider]  Carboxymethylcellulose Sodium (ARTIFICIAL TEARS OP) Place 1 drop into both eyes 3 (three) times daily as needed (dry eyes).    [provider]  Cholecalciferol (VITAMIN D) 50 MCG (2000 UT) tablet Take 2,000 Units by mouth 2 (two) times daily.    [provider]  Cyanocobalamin (VITAMIN B-12 PO) Take 6 mcg by mouth in  the morning and at bedtime.    [provider]  diclofenac Sodium (VOLTAREN) 1 % GEL Apply 2 g topically daily. For feet pain 07/03/20   [provider]  DULoxetine (CYMBALTA) 30 MG capsule Take 30-60 mg by mouth See admin instructions. 60 mg in the morning  30 mg at bedtime    [provider]  ferrous sulfate 325 (65 FE) MG tablet Take 325 mg by mouth every Monday, Wednesday, and Friday. 07/18/19   [provider]  fluticasone (FLONASE) 50 MCG/ACT nasal spray Place 2 sprays into both nostrils daily as needed for allergies or rhinitis.    [provider]  galantamine (RAZADYNE ER) 8 MG 24 hr capsule Take 16 mg by mouth at bedtime. 03/28/21   [provider]  lidocaine (LIDODERM) 5 % Place 2 patches onto the skin daily. Remove & Discard patch within 12 hours or as directed by MD 06/03/21   Angiulli, Lavon Paganini, PA-C  Lidocaine HCl 4 % CREA Apply 1 g topically daily as needed (pain). [facility]    [provider]  MAGNESIUM PO Take 375 mg by mouth in the morning and at bedtime.    [provider]  memantine (NAMENDA) 10 MG tablet Take 10-20 mg by mouth See admin instructions. 10 mg in the morning  20 mg at bedtime 03/28/21   [provider]  methocarbamol (ROBAXIN) 500 MG tablet Take 1 tablet (  500 mg total) by mouth every 6 (six) hours as needed for muscle spasms. 06/03/21   Angiulli, Lavon Paganini, PA-C  Multiple Vitamin (MULTIVITAMIN) capsule Take 1 capsule by mouth daily.    [provider]  NON FORMULARY CPAP at bedtime    [provider]  Pancrelipase, Lip-Prot-Amyl, (CREON PO) Take 36,000 Units by mouth 3 (three) times daily with meals.    [provider]  pantoprazole (PROTONIX) 40 MG tablet Take 1 tablet (40 mg total) by mouth daily. Patient not taking: Reported on 05/15/2021 01/04/21   Kathyrn Drown D, NP  POTASSIUM PO Take 450 mg by mouth in the morning and at bedtime.    [provider]   prazosin (MINIPRESS) 1 MG capsule Take 2 mg by mouth at bedtime. 04/08/21   [provider]  Probiotic CHEW Chew 3 capsules by mouth daily.    [provider]  Simethicone 180 MG CAPS Take 180 mg by mouth daily as needed (gas).    [provider]  sodium chloride (OCEAN) 0.65 % SOLN nasal spray Place 1 spray into both nostrils at bedtime.    [provider]  traZODone (DESYREL) 50 MG tablet Take 1 tablet (50 mg total) by mouth at bedtime. 06/03/21   Angiulli, Lavon Paganini, PA-C  VITAMIN A PO Take 4,000 Units by mouth daily.    [provider]  vitamin C (ASCORBIC ACID) 250 MG tablet Take 250 mg by mouth 2 (two) times daily.    [provider]  zinc sulfate 220 (50 Zn) MG capsule Take 220 mg by mouth daily. 04/10/21   [provider]      Allergies    Amphetamine, Diazepam, Zoloft [sertraline], Gabapentin, Gluten meal, and Penicillin g    Review of Systems   Review of Systems  All other systems reviewed and are negative.   Physical Exam Updated Vital Signs BP 114/74   Pulse 80   Temp (!) 97.5 F (36.4 C) (Oral)   Resp 18   Ht 5\' 10"  (1.778 m)   Wt 81.6 kg   SpO2 94%   BMI 25.83 kg/m  Physical Exam Vitals and nursing note reviewed.  Constitutional:      General: He is not in acute distress.    Appearance: He is well-developed.     Comments: Thin elderly male awake and alert cervical collar in place speaking clearly  HENT:     Head: Normocephalic and atraumatic.  Eyes:     Conjunctiva/sclera: Conjunctivae normal.  Neck:     Comments: No obvious deformity, no tenderness to palpation midline Cardiovascular:     Rate and Rhythm: Normal rate and regular rhythm.  Pulmonary:     Effort: Pulmonary effort is normal. No respiratory distress.     Breath sounds: No stridor.  Abdominal:     General: There is no distension.  Musculoskeletal:       Arms:  Skin:    General: Skin is warm and dry.  Neurological:     Mental  Status: He is alert and oriented to person, place, and time.     Comments: Substantial atrophy, otherwise neuroexam unremarkable     ED Results / Procedures / Treatments   Labs (all labs ordered are listed, but only abnormal results are displayed) Labs Reviewed  BRAIN NATRIURETIC PEPTIDE  CBC WITH DIFFERENTIAL/PLATELET    EKG None  Radiology No results found.  Procedures Procedures  {Document cardiac monitor, telemetry assessment procedure when appropriate:1}  Medications Ordered in  ED Medications - No data to display  ED Course/ Medical Decision Making/ A&P This patient with a Hx of A-fib, previously on anticoagulation, gait difficulties, frequent falls presents to the ED for concern of pain to multiple areas following a fall, this involves an extensive number of treatment options, and is a complaint that carries with it a high risk of complications and morbidity.    The differential diagnosis includes posttraumatic effects, prefall issues, though the patient states that he was generally well, had no change from baseline prior to the fall and losing balance   Social Determinants of Health:  Age, gait unsteadiness  Additional history obtained:  Additional history and/or information obtained from chart review, notable for ongoing evaluation for peripheral polyneuropathy   After the initial evaluation, orders, including: Labs CT x-ray were initiated.  Patient declined additional analgesics after receiving Tylenol en route.   Patient placed on Cardiac and Pulse-Oximetry Monitors. The patient was maintained on a cardiac monitor.  The cardiac monitored showed an rhythm of 80 sinus normal The patient was also maintained on pulse oximetry. The readings were typically 100% room air normal   On repeat evaluation of the patient {resolved/improved/worsened:23923::"improved"}  Lab Tests:  I personally interpreted labs.  The pertinent results include:  ***  Imaging Studies  ordered:  I independently visualized and interpreted imaging which showed *** I agree with the radiologist interpretation  Consultations Obtained:  I requested consultation with the ***,  and discussed lab and imaging findings as well as pertinent plan - they recommend: ***  Dispostion / Final MDM:  After consideration of the diagnostic results and the patient's response to treatment, ***  Final Clinical Impression(s) / ED Diagnoses Final diagnoses:  None    Rx / DC Orders ED Discharge Orders     None

## 2022-03-02 NOTE — ED Notes (Signed)
Patient transported to CT 

## 2022-03-02 NOTE — ED Notes (Signed)
Received verbal report from Patti F RN at this time 

## 2022-03-02 NOTE — ED Notes (Signed)
Patient returned from X-ray 

## 2022-03-03 ENCOUNTER — Encounter (HOSPITAL_COMMUNITY): Payer: Self-pay

## 2022-03-03 ENCOUNTER — Observation Stay (HOSPITAL_COMMUNITY): Payer: No Typology Code available for payment source

## 2022-03-03 ENCOUNTER — Other Ambulatory Visit: Payer: Self-pay

## 2022-03-03 MED ORDER — BUMETANIDE 2 MG PO TABS: 4.0000 mg | ORAL_TABLET | Freq: Every day | ORAL | Status: DC | PRN

## 2022-03-03 MED ORDER — IOHEXOL 350 MG/ML SOLN
75.0000 mL | Freq: Once | INTRAVENOUS | Status: AC | PRN
  Administered 2022-03-03: 75 mL via INTRAVENOUS

## 2022-03-03 MED ORDER — BUPRENORPHINE HCL 2 MG SL SUBL
2.0000 mg | SUBLINGUAL_TABLET | Freq: Every day | SUBLINGUAL | Status: DC
  Administered 2022-03-03 – 2022-03-10 (×8): 2 mg via SUBLINGUAL
  Filled 2022-03-03 (×8): qty 1

## 2022-03-03 NOTE — Evaluation (Signed)
Occupational Therapy Evaluation Patient Details Name: Jack Neal MRN: [id number] DOB: Aug 12, 1945 Today's Date: 03/03/2022   History of Present Illness Patient is a 76 y/o male who presents with left distal radius fx, right 5th rib fx and left hip pain s/p falling over his rollator after hitting a curb. PMH includes PAF, neuropathy, pacemaker, OSA on CPAP, PTSD, depression, chronic pain syndrome.   Clinical Impression   PTA, pt lives with spouse, typically Modified Independent with ADLs, IADLs and mobility using Rollator. Pt presents now with deficits in nondominant L UE use, pain, endurance and dynamic standing balance. Due to conservative approach of L UE, trialed quad cane for mobility with fair carryover. Pt requires min guard for short mobility using cane, limited in progression due to lightheadedness and weakness. Pt requires Min A for UB ADL and Min-Mod A for LB ADLs due to deficits though demonstrating good initiation of compensatory strategies. Pt reports family unable to provide physical assist at DC and pt concerned about fall risk, rec SNF rehab at this time.      Recommendations for follow up therapy are one component of a multi-disciplinary discharge planning process, led by the attending physician.  Recommendations may be updated based on patient status, additional functional criteria and insurance authorization.   Follow Up Recommendations  Skilled nursing-short term rehab (<3 hours/day)     Assistance Recommended at Discharge Intermittent Supervision/Assistance  Patient can return home with the following A little help with walking and/or transfers;A little help with bathing/dressing/bathroom;Assistance with cooking/housework    Functional Status Assessment  Patient has had a recent decline in their functional status and demonstrates the ability to make significant improvements in function in a reasonable and predictable amount of time.  Equipment Recommendations  None  recommended by OT    Recommendations for Other Services       Precautions / Restrictions Precautions Precautions: Fall;Other (comment) Precaution Comments: hx of falls and vertigo Restrictions Weight Bearing Restrictions: No Other Position/Activity Restrictions: treated LUE as NWB but no specific orders noted      Mobility Bed Mobility               General bed mobility comments: up in chair on entry    Transfers Overall transfer level: Needs assistance Equipment used: Quad cane Transfers: Sit to/from Stand Sit to Stand: Min assist           General transfer comment: light min a to stabilize standing from recliner      Balance Overall balance assessment: Needs assistance, History of Falls Sitting-balance support: Feet supported, No upper extremity supported Sitting balance-Leahy Scale: Fair     Standing balance support: During functional activity, Single extremity supported Standing balance-Leahy Scale: Poor                             ADL either performed or assessed with clinical judgement   ADL Overall ADL's : Needs assistance/impaired Eating/Feeding: Modified independent;Sitting   Grooming: Min guard;Standing;Oral care Grooming Details (indicate cue type and reason): able to self implement compensatory strategies with one handed use, use L hand to help hold toothbrush, etc Upper Body Bathing: Minimal assistance;Sitting   Lower Body Bathing: Minimal assistance;Sit to/from stand   Upper Body Dressing : Minimal assistance;Sitting   Lower Body Dressing: Moderate assistance;Sit to/from stand   Toilet Transfer: Min guard;Ambulation   Toileting- Clothing Manipulation and Hygiene: Sitting/lateral lean;Sit to/from stand;Moderate assistance       Functional mobility  during ADLs: Min guard General ADL Comments: trial of quad cane for in room mobility w/ pt reporting quick fatigue, lightheadedness and need to sit. problem solved ADL  techniques w/ light assist needed and pt also with concerns regading ktichen task mgmt     Vision Ability to See in Adequate Light: 0 Adequate Patient Visual Report: No change from baseline Vision Assessment?: No apparent visual deficits     Perception     Praxis      Pertinent Vitals/Pain Pain Assessment Pain Assessment: Faces Faces Pain Scale: Hurts little more Pain Location: R side, L wrist Pain Descriptors / Indicators: Sore, Discomfort Pain Intervention(s): Monitored during session, Limited activity within patient's tolerance     Hand Dominance Right   Extremity/Trunk Assessment Upper Extremity Assessment Upper Extremity Assessment: LUE deficits/detail LUE Deficits / Details: swelling in digits, able to make light fist and hold items functionally, sugar tong splint in place wrapping behind elbow LUE Coordination: decreased fine motor   Lower Extremity Assessment Lower Extremity Assessment: Defer to PT evaluation   Cervical / Trunk Assessment Cervical / Trunk Assessment: Kyphotic   Communication Communication Communication: No difficulties   Cognition Arousal/Alertness: Awake/alert Behavior During Therapy: WFL for tasks assessed/performed Overall Cognitive Status: Within Functional Limits for tasks assessed                                       General Comments       Exercises     Shoulder Instructions      Home Living Family/patient expects to be discharged to:: Private residence Living Arrangements: Spouse/significant other Available Help at Discharge: Family;Available 24 hours/day Type of Home: House Home Access: Level entry     Home Layout: One level     Bathroom Shower/Tub: Chief Strategy Officer: Standard Bathroom Accessibility: Yes How Accessible: Accessible via walker Home Equipment: Rollator (4 wheels);Tub bench;Transport chair;Cane - single point;Wheelchair - Consulting civil engineer Comments:  reports wife not able to physically assist with mobility      Prior Functioning/Environment Prior Level of Function : Independent/Modified Independent             Mobility Comments: Mod indep ambulating "with my rollator all the time; Currently working with HHPT services. Retired Cabin crew. Reports 4 falls in 1 year. Does not drive. Daughter assists with driving/appts. Does his own cooking. ADLs Comments: Typically sponge bathes at sink, will use tub transfer bench 2-3/wk for shower.        OT Problem List: Decreased strength;Decreased activity tolerance;Impaired balance (sitting and/or standing);Pain;Impaired UE functional use      OT Treatment/Interventions: Self-care/ADL training;Therapeutic exercise;Energy conservation;DME and/or AE instruction;Therapeutic activities    OT Goals(Current goals can be found in the care plan section) Acute Rehab OT Goals Patient Stated Goal: regain independence, be able to cook for myself OT Goal Formulation: With patient Time For Goal Achievement: 03/17/22 Potential to Achieve Goals: Good  OT Frequency: Min 2X/week    Co-evaluation              AM-PAC OT "6 Clicks" Daily Activity     Outcome Measure Help from another person eating meals?: None Help from another person taking care of personal grooming?: A Little Help from another person toileting, which includes using toliet, bedpan, or urinal?: A Lot Help from another person bathing (including washing, rinsing, drying)?: A Little Help from another person to put  on and taking off regular upper body clothing?: A Little Help from another person to put on and taking off regular lower body clothing?: A Lot 6 Click Score: 17   End of Session Equipment Utilized During Treatment: Gait belt;Other (comment) (cane)  Activity Tolerance: Patient tolerated treatment well Patient left: in chair;with call bell/phone within reach;with chair alarm set  OT Visit Diagnosis: Other abnormalities of gait and  mobility (R26.89);Unsteadiness on feet (R26.81)                Time: 8032-1224 OT Time Calculation (min): 31 min Charges:  OT General Charges $OT Visit: 1 Visit OT Evaluation $OT Eval Low Complexity: 1 Low OT Treatments $Self Care/Home Management : 8-22 mins  Bradd Canary, OTR/L Acute Rehab Services Office: [phone]   Lorre Munroe 03/03/2022, 2:58 PM

## 2022-03-03 NOTE — Evaluation (Signed)
Physical Therapy Evaluation Patient Details Name: Jack Neal MRN: [id number] DOB: Dec 27, 1945 Today's Date: 03/03/2022  History of Present Illness  Patient is a 76 y/o male who presents with left distal radius fx, right 5th rib fx and left hip pain s/p falling over his rollator after hitting a curb. PMH includes PAF, neuropathy, pacemaker, OSA on CPAP, PTSD, depression, chronic pain syndrome.  Clinical Impression  Patient presents with pain, generalized weakness, impaired balance, vertigo and impaired mobility s/p above. Pt lives at home with his wife and reports being Mod I for ADLs and IADLs at baseline, using rollator for support. Does endorse hx of 4 falls in the last year. Today, pt requires Min A for transfers and gait training with use of RW for support. Will trial SPC next session. Pt with episode of vertigo and weakness in LEs halting mobility. Hx of vertigo at home but reports it usually subsides with seated rest break and it does not during the session. Possibly recommend sling for LUE for support during mobility? Pt's wife is not able to physically assist at home as her daughter is her caregiver but she still works. Will follow progress to see if pt can progress well enough to return home safely but will need HH aide to assist with ADLs/IADLS if this is the case as he is a high fall risk, has hx of vertigo and now cannot use LUE. At this time recommending SNF to maximize independence and mobility prior to return home. Pt aware and in agreeance if not able to progress well enough to return home. Consulting mobility tech as well.   Will follow acutely.     Recommendations for follow up therapy are one component of a multi-disciplinary discharge planning process, led by the attending physician.  Recommendations may be updated based on patient status, additional functional criteria and insurance authorization.  Follow Up Recommendations Skilled nursing-short term rehab (<3 hours/day) Can  patient physically be transported by private vehicle: Yes    Assistance Recommended at Discharge Frequent or constant Supervision/Assistance  Patient can return home with the following  A little help with walking and/or transfers;A little help with bathing/dressing/bathroom;Assistance with cooking/housework;Help with stairs or ramp for entrance;Assist for transportation    Equipment Recommendations None recommended by PT  Recommendations for Other Services       Functional Status Assessment Patient has had a recent decline in their functional status and demonstrates the ability to make significant improvements in function in a reasonable and predictable amount of time.     Precautions / Restrictions Precautions Precautions: Fall;Other (comment) Precaution Comments: hx of falls and vertigo Restrictions Weight Bearing Restrictions: No Other Position/Activity Restrictions: treated LUE as NWB but no specific orders noted      Mobility  Bed Mobility Overal bed mobility: Needs Assistance Bed Mobility: Supine to Sit     Supine to sit: Min assist, HOB elevated     General bed mobility comments: ASsist with trunk to get to EOB, cues to refrain from using LUE, increased time. Use of rail.    Transfers Overall transfer level: Needs assistance Equipment used: Rolling walker (2 wheels) Transfers: Sit to/from Stand Sit to Stand: Min guard, From elevated surface           General transfer comment: Min guard for safety. Stood from EOB x1 from elevated bed height, transferred to recliner post walk.    Ambulation/Gait Ambulation/Gait assistance: Min assist Gait Distance (Feet): 50 Feet Assistive device: Rolling walker (2 wheels) Gait Pattern/deviations: Step-through  pattern, Decreased stride length, Narrow base of support Gait velocity: decreased Gait velocity interpretation: <1.31 ft/sec, indicative of household ambulator   General Gait Details: Slow, guarded gait with LUE  held close to chest, episode of vertigo, bil knee weakness/instabiltiy with increased distance. Assisting mobilize RW on left side. Will need SPC next session.  Stairs            Wheelchair Mobility    Modified Rankin (Stroke Patients Only)       Balance Overall balance assessment: Needs assistance, History of Falls Sitting-balance support: Feet supported, No upper extremity supported Sitting balance-Leahy Scale: Fair     Standing balance support: During functional activity, Single extremity supported Standing balance-Leahy Scale: Poor Standing balance comment: Able to stand statically with Min guard assist but needs up to Min A for dynamic tasks/gait                             Pertinent Vitals/Pain Pain Assessment Pain Assessment: Faces Faces Pain Scale: Hurts little more Pain Location: left side Pain Descriptors / Indicators: Sore, Discomfort Pain Intervention(s): Monitored during session, Repositioned    Home Living Family/patient expects to be discharged to:: Private residence Living Arrangements: Spouse/significant other Available Help at Discharge: Family;Available 24 hours/day Type of Home: House Home Access: Level entry       Home Layout: One level Home Equipment: Rollator (4 wheels);Tub bench;Transport chair;Cane - single point;Wheelchair - Biomedical engineer Comments: reports wife not able to physically assist with mobility    Prior Function Prior Level of Function : Independent/Modified Independent             Mobility Comments: Mod indep ambulating "with my rollator all the time; Currently working with [place] services. Retired Therapist, art. Reports 4 falls in 1 year. Does not drive. Daughter assists with driving/appts. Does his own cooking. ADLs Comments: Typically sponge bathes at sink, will use tub transfer bench 2-3/wk for shower.     Hand Dominance   Dominant Hand: Right    Extremity/Trunk Assessment   Upper Extremity  Assessment Upper Extremity Assessment: Defer to OT evaluation;LUE deficits/detail LUE Deficits / Details: Swelling in digits with good AROM LUE Sensation: decreased light touch    Lower Extremity Assessment Lower Extremity Assessment: Generalized weakness    Cervical / Trunk Assessment Cervical / Trunk Assessment: Kyphotic  Communication   Communication: No difficulties  Cognition Arousal/Alertness: Awake/alert Behavior During Therapy: WFL for tasks assessed/performed Overall Cognitive Status: Within Functional Limits for tasks assessed                                          General Comments      Exercises     Assessment/Plan    PT Assessment Patient needs continued PT services  PT Problem List Decreased strength;Decreased mobility;Pain;Impaired sensation;Decreased balance;Decreased activity tolerance       PT Treatment Interventions Therapeutic exercise;Gait training;DME instruction;Balance training;Functional mobility training;Therapeutic activities;Patient/family education    PT Goals (Current goals can be found in the Care Plan section)  Acute Rehab PT Goals Patient Stated Goal: to get stronger and go home PT Goal Formulation: With patient Time For Goal Achievement: 03/17/22 Potential to Achieve Goals: Good    Frequency Min 3X/week     Co-evaluation               AM-PAC PT "6 Clicks"  Mobility  Outcome Measure Help needed turning from your back to your side while in a flat bed without using bedrails?: A Little Help needed moving from lying on your back to sitting on the side of a flat bed without using bedrails?: A Little Help needed moving to and from a bed to a chair (including a wheelchair)?: A Little Help needed standing up from a chair using your arms (e.g., wheelchair or bedside chair)?: A Little Help needed to walk in hospital room?: A Little Help needed climbing 3-5 steps with a railing? : A Lot 6 Click Score: 17    End of  Session Equipment Utilized During Treatment: Gait belt Activity Tolerance: Treatment limited secondary to medical complications (Comment) (vertigo) Patient left: in chair;with call bell/phone within reach;with chair alarm set;with nursing/sitter in room Nurse Communication: Mobility status PT Visit Diagnosis: Pain;Unsteadiness on feet (R26.81);Difficulty in walking, not elsewhere classified (R26.2) Pain - Right/Left: Left Pain - part of body:  (side)    Time: [id number] PT Time Calculation (min) (ACUTE ONLY): 32 min   Charges:   PT Evaluation $PT Eval Moderate Complexity: 1 Mod PT Treatments $Gait Training: 8-22 mins        Zettie Cooley, DPT Acute Rehabilitation Services Secure chat preferred Office [place] 03/03/2022, 9:53 AM

## 2022-03-03 NOTE — Progress Notes (Signed)
Patient transported off unit to [place] via wheelchair by transportation staff.

## 2022-03-04 DIAGNOSIS — D62 Acute posthemorrhagic anemia: Secondary | ICD-10-CM | POA: Diagnosis present

## 2022-03-04 DIAGNOSIS — S5292XA Unspecified fracture of left forearm, initial encounter for closed fracture: Secondary | ICD-10-CM | POA: Diagnosis present

## 2022-03-04 DIAGNOSIS — Z808 Family history of malignant neoplasm of other organs or systems: Secondary | ICD-10-CM | POA: Diagnosis not present

## 2022-03-04 DIAGNOSIS — S301XXA Contusion of abdominal wall, initial encounter: Secondary | ICD-10-CM | POA: Diagnosis present

## 2022-03-04 DIAGNOSIS — Z87891 Personal history of nicotine dependence: Secondary | ICD-10-CM | POA: Diagnosis not present

## 2022-03-04 DIAGNOSIS — F410 Panic disorder [episodic paroxysmal anxiety] without agoraphobia: Secondary | ICD-10-CM | POA: Diagnosis present

## 2022-03-04 DIAGNOSIS — Z8249 Family history of ischemic heart disease and other diseases of the circulatory system: Secondary | ICD-10-CM | POA: Diagnosis not present

## 2022-03-04 DIAGNOSIS — G629 Polyneuropathy, unspecified: Secondary | ICD-10-CM | POA: Diagnosis present

## 2022-03-04 DIAGNOSIS — F32A Depression, unspecified: Secondary | ICD-10-CM | POA: Diagnosis present

## 2022-03-04 DIAGNOSIS — G4733 Obstructive sleep apnea (adult) (pediatric): Secondary | ICD-10-CM | POA: Diagnosis present

## 2022-03-04 DIAGNOSIS — I480 Paroxysmal atrial fibrillation: Secondary | ICD-10-CM | POA: Diagnosis present

## 2022-03-04 DIAGNOSIS — F4310 Post-traumatic stress disorder, unspecified: Secondary | ICD-10-CM | POA: Diagnosis present

## 2022-03-04 DIAGNOSIS — Z950 Presence of cardiac pacemaker: Secondary | ICD-10-CM | POA: Diagnosis not present

## 2022-03-04 DIAGNOSIS — M25552 Pain in left hip: Secondary | ICD-10-CM | POA: Diagnosis present

## 2022-03-04 DIAGNOSIS — G894 Chronic pain syndrome: Secondary | ICD-10-CM | POA: Diagnosis present

## 2022-03-04 DIAGNOSIS — S2239XA Fracture of one rib, unspecified side, initial encounter for closed fracture: Secondary | ICD-10-CM | POA: Diagnosis present

## 2022-03-04 DIAGNOSIS — K900 Celiac disease: Secondary | ICD-10-CM | POA: Diagnosis present

## 2022-03-04 DIAGNOSIS — Z91018 Allergy to other foods: Secondary | ICD-10-CM | POA: Diagnosis not present

## 2022-03-04 DIAGNOSIS — J90 Pleural effusion, not elsewhere classified: Secondary | ICD-10-CM | POA: Diagnosis present

## 2022-03-04 DIAGNOSIS — Z9884 Bariatric surgery status: Secondary | ICD-10-CM | POA: Diagnosis not present

## 2022-03-04 DIAGNOSIS — Z888 Allergy status to other drugs, medicaments and biological substances status: Secondary | ICD-10-CM | POA: Diagnosis not present

## 2022-03-04 DIAGNOSIS — K8689 Other specified diseases of pancreas: Secondary | ICD-10-CM | POA: Diagnosis present

## 2022-03-04 DIAGNOSIS — K219 Gastro-esophageal reflux disease without esophagitis: Secondary | ICD-10-CM | POA: Diagnosis present

## 2022-03-04 DIAGNOSIS — S2231XA Fracture of one rib, right side, initial encounter for closed fracture: Secondary | ICD-10-CM | POA: Diagnosis present

## 2022-03-04 DIAGNOSIS — Z880 Allergy status to penicillin: Secondary | ICD-10-CM | POA: Diagnosis not present

## 2022-03-04 LAB — CBC
HCT: 18.2 % — ABNORMAL LOW (ref 39.0–52.0)
Hemoglobin: 6 g/dL — CL (ref 13.0–17.0)
MCH: 35.5 pg — ABNORMAL HIGH (ref 26.0–34.0)
MCHC: 33 g/dL (ref 30.0–36.0)
MCV: 107.7 fL — ABNORMAL HIGH (ref 80.0–100.0)
Platelets: 205 10*3/uL (ref 150–400)
RBC: 1.69 MIL/uL — ABNORMAL LOW (ref 4.22–5.81)
RDW: 14.1 % (ref 11.5–15.5)
WBC: 5.2 10*3/uL (ref 4.0–10.5)
nRBC: 0 % (ref 0.0–0.2)

## 2022-03-04 LAB — BASIC METABOLIC PANEL
Anion gap: 8 (ref 5–15)
BUN: 16 mg/dL (ref 8–23)
CO2: 33 mmol/L — ABNORMAL HIGH (ref 22–32)
Calcium: 7.7 mg/dL — ABNORMAL LOW (ref 8.9–10.3)
Chloride: 94 mmol/L — ABNORMAL LOW (ref 98–111)
Creatinine, Ser: 0.87 mg/dL (ref 0.61–1.24)
GFR, Estimated: >60 mL/min (ref >60)
Glucose, Bld: 90 mg/dL (ref 70–99)
Potassium: 3.8 mmol/L (ref 3.5–5.1)
Sodium: 135 mmol/L (ref 135–145)

## 2022-03-04 LAB — PREPARE RBC (CROSSMATCH)

## 2022-03-04 LAB — HEMOGLOBIN AND HEMATOCRIT, BLOOD
HCT: 19.3 % — ABNORMAL LOW (ref 39.0–52.0)
Hemoglobin: 6.4 g/dL — CL (ref 13.0–17.0)

## 2022-03-04 MED ORDER — SODIUM CHLORIDE 0.9% IV SOLUTION: Freq: Once | INTRAVENOUS | Status: AC

## 2022-03-04 MED ORDER — VITAMIN C 500 MG PO TABS
500.0000 mg | ORAL_TABLET | Freq: Two times a day (BID) | ORAL | Status: DC
  Administered 2022-03-04 – 2022-03-10 (×13): 500 mg via ORAL
  Filled 2022-03-04 (×13): qty 1

## 2022-03-04 MED ORDER — ADULT MULTIVITAMIN W/MINERALS CH
1.0000 | ORAL_TABLET | Freq: Every day | ORAL | Status: DC
  Administered 2022-03-04 – 2022-03-10 (×7): 1 via ORAL
  Filled 2022-03-04 (×7): qty 1

## 2022-03-04 MED ORDER — PANTOPRAZOLE SODIUM 40 MG PO TBEC
40.0000 mg | DELAYED_RELEASE_TABLET | Freq: Every day | ORAL | Status: DC
  Administered 2022-03-04 – 2022-03-10 (×7): 40 mg via ORAL
  Filled 2022-03-04 (×7): qty 1

## 2022-03-04 MED ORDER — POLYETHYLENE GLYCOL 3350 17 G PO PACK
17.0000 g | PACK | Freq: Every day | ORAL | Status: DC
  Administered 2022-03-04 – 2022-03-10 (×7): 17 g via ORAL
  Filled 2022-03-04 (×7): qty 1

## 2022-03-04 MED ORDER — TRAMADOL HCL 50 MG PO TABS
25.0000 mg | ORAL_TABLET | Freq: Four times a day (QID) | ORAL | Status: DC | PRN
  Administered 2022-03-06 – 2022-03-10 (×4): 50 mg via ORAL
  Filled 2022-03-04 (×4): qty 1

## 2022-03-04 MED ORDER — FERROUS SULFATE 325 (65 FE) MG PO TABS
325.0000 mg | ORAL_TABLET | Freq: Every day | ORAL | Status: DC
  Administered 2022-03-05 – 2022-03-10 (×6): 325 mg via ORAL
  Filled 2022-03-04 (×6): qty 1

## 2022-03-04 MED ORDER — BISACODYL 10 MG RE SUPP
10.0000 mg | Freq: Once | RECTAL | Status: AC
  Administered 2022-03-04: 10 mg via RECTAL
  Filled 2022-03-04: qty 1

## 2022-03-04 NOTE — Progress Notes (Addendum)
Subjective: CC: Says he doesn't feel well today. Multiple areas of pain.   Pain over the R rib. No sob at rest but did have some with exertion yesterday.   Pain over the L arm is improved. No n/t in the hand. Can move all digits. R handed.   Some pain over area of hematoma along the L flank. No abdominal pain. Tolerating diet without n/v. Passing flatus. No bm since Monday. Wants a suppository.   Pain in both feet and ankles. Notes he has baseline peripheral nueropathy that is bothering him. Does not taken gabapentin etc at baseline, manages pain with suboxone.  Notes bandlike HA. No n/v. No visual changes but dizzy (which is not new for him, but feels like it is excerebrated - usually only with walking but now with just lying in bed, not sure if it is when he moves his head).   Knot on R forehead from fall in september.   Hgb 6.0 this am. BP 94/59, likely 2/2 this. His pressures have been soft in the ~90/50's since admit. No tachycardia. He is not on a BB. CT yesterday showed left anterior abdominal wall hematoma that connects to a flank hematoma. He is not on blood thinners for his A. Fib (hx left atrial appendage occlusion).   Afebrile. WBC wnl. Voiding. Cr wnl.   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 On multi, PPI, vitamin D, iron, vitamin c at baseline.   Dr. [facility] manages his suboxone. Doesn't want oxy, wants Tramadol. Has taken Tramadol in the past without issues. Reports percocet/oxy is what lead to him being on suboxone in the past.   Objective: Vital signs in last 24 hours: Temp:  [97.4 F (36.3 C)-98.8 F (37.1 C)] 98.2 F (36.8 C) (12/20 0839) Pulse Rate:  [71-81] 80 (12/20 0839) Resp:  [16-17] 17 (12/20 0839) BP: (94-102)/(55-59) 94/59 (12/20 0839) SpO2:  [97 %-100 %] 100 % (12/20 0839) Last BM Date : 03/02/22  Intake/Output from previous day: 12/19 0701 - 12/20 0700 In: 180 [P.O.:180] Out: 100 [Urine:100] Intake/Output this shift: Total  I/O In: -  Out: 100 [Urine:100]  PE: Gen:  Alert, NAD, pleasant HEENT: EOM's intact, pupils equal and round Card:  Reg rate Pulm:  CTAB, no W/R/R, effort normal. On RA. R chest wall ttp.  Abd: Soft, ND, NT, +BS. He does have noted bruising/hematoma over the left lateral abdominal wall/flank that is ttp. The area is soft (not tense/tight) and without overlying erythema or heat. Ext: L forearm splint in place. Moves all digits, wwp, SILT. Moves RUE spontaneously without reported pain. Moves b/l hips and knee without reported pain. Diffuse ttp over the b/l ankle and feet without point ttp. He has intact plantar flexion/dorsiflexion and this does not seem to exacerbate his symptoms. There is no swelling, deformity or bruising to ankle of feet b/l. SILT b/l and equal.   Psych: A&Ox3  Neuro: non-focal Skin: no rashes noted, warm and dry  Lab Results:  Recent Labs    03/02/22 1533 03/02/22 1802 03/04/22 0704  WBC 4.9  --  5.2  HGB 9.7* 9.9* 6.0*  HCT 31.8* 29.0* 18.2*  PLT 165  --  205   BMET Recent Labs    03/02/22 1802 03/04/22 0704  NA 139 135  K 3.4* 3.8  CL 93* 94*  CO2  --  33*  GLUCOSE 89 90  BUN 12 16  CREATININE 0.60* 0.87  CALCIUM  --  7.7*   PT/INR No results for input(s): "LABPROT", "INR" in the last 72 hours. CMP     Component Value Date/Time   NA 135 03/04/2022 0704   NA 142 02/10/2021 1335   K 3.8 03/04/2022 0704   CL 94 (L) 03/04/2022 0704   CO2 33 (H) 03/04/2022 0704   GLUCOSE 90 03/04/2022 0704   BUN 16 03/04/2022 0704   BUN 17 02/10/2021 1335   CREATININE 0.87 03/04/2022 0704   CALCIUM 7.7 (L) 03/04/2022 0704   PROT 5.2 (L) 05/21/2021 0540   ALBUMIN 2.7 (L) 05/21/2021 0540   AST 22 05/21/2021 0540   ALT 10 05/21/2021 0540   ALKPHOS 92 05/21/2021 0540   BILITOT 1.7 (H) 05/21/2021 0540   GFRNONAA >60 03/04/2022 0704   Lipase  No results found for: "LIPASE"  Studies/Results: CT ABDOMEN PELVIS W CONTRAST  Result Date: 03/03/2022 CLINICAL  DATA:  Acute pelvic pain. EXAM: CT ABDOMEN AND PELVIS WITH CONTRAST TECHNIQUE: Multidetector CT imaging of the abdomen and pelvis was performed using the standard protocol following bolus administration of intravenous contrast. RADIATION DOSE REDUCTION: This exam was performed according to the departmental dose-optimization program which includes automated exposure control, adjustment of the mA and/or kV according to patient size and/or use of iterative reconstruction technique. CONTRAST:  101mL OMNIPAQUE IOHEXOL 350 MG/ML SOLN COMPARISON:  CT chest, abdomen, and pelvis 05/14/2021 FINDINGS: Lower chest: Small left and trace right pleural effusions with associated curvilinear bilateral lower lobe subsegmental atelectasis and heart size is mildly to moderately enlarged. Cardiac pacer wires are noted. Hepatobiliary: Smooth liver contours. Scattered calcific densities within the liver, similar to prior. Status post cholecystectomy. Mild central intrahepatic biliary ductal dilatation is similar to prior and presumably postsurgical. Pancreas: Mild pancreatic atrophy, unchanged from prior and within normal limits for patient age. Spleen: Numerous dystrophic calcifications within the spleen, likely the sequela of remote granulomatous infection. Adrenals/Urinary Tract: Normal adrenals. 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 Unchanged tiny subcentimeter low-attenuation lesion within the medial aspect of the lower pole of the right kidney, too small to further characterize. No follow-up imaging is recommended. No focal urinary bladder wall thickening. Stomach/Bowel: Moderate stool is seen throughout the colon compatible with mild constipation. The terminal ileum is unremarkable. The appendix is not confidently identified, however no inflammatory  changes are seen around the cecum to indicate secondary signs of acute appendicitis. Postsurgical changes are again seen of gastric 5 has. No dilated loops of small bowel are seen to indicate bowel obstruction. Vascular/Lymphatic: No abdominal aortic aneurysm. High-grade atherosclerotic calcifications. The major intra-aortic branch vessels are patent. No mesenteric, retroperitoneal, or pelvic lymphadenopathy. Reproductive: The prostate is unremarkable. There again appears to be on the right seminal vesicle, consistent with congenital absence of the left kidney. Other: No ventral abdominal wall hernia is seen. There is mild left-greater-than-right ventral abdominal wall subcutaneous fat edema and swelling. There is a higher density mildly heterogeneous likely hematoma seen throughout the left anterior abdominal wall extending into the left flank. The left anterior abdominal wall component measures up to approximately 15 x 4 x 8 cm (transverse by AP by craniocaudal; axial series 3, image 50 and sagittal series 7, image 107). This connects to a left flank hematoma that measures up to 5 x 14 x 5 cm (transverse by AP by craniocaudal; axial series 2, image 38 and sagittal  series 7, image 134). Musculoskeletal: There is diffuse decreased bone mineralization. Partial visualization of right proximal femoral cephalomedullary nail fixation. There is again a cyst within the left femoral neck measuring up to approximately 1.9 x 2.9 x 4.1 cm (transverse by AP by craniocaudal, similar to 05/14/2021). When measured in a similar manner on 05/31/2019 CT pelvis, this measured 1.9 x 2.9 x 4.0 cm. Chronic high grade 1 anterolisthesis of L5 on S1 with posterior L5-S1 disc space osseous fusion, unchanged from prior. IMPRESSION: Compared to 05/14/2021: 1. New left-greater-than-right ventral abdominal wall subcutaneous fat edema and swelling. There is a new left anterior abdominal wall hematoma measuring up to 15 x 4 x 8 cm. This connects  to a left flank hematoma measuring up to 5 x 14 x 5 cm. 2. Small left and trace right pleural effusions with associated curvilinear bilateral lower lobe subsegmental atelectasis. This is new from 05/14/2021. 3. 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 4. Moderate stool is seen throughout the colon compatible with mild constipation. Electronically Signed   By: Yvonne Kendall M.D.   On: 03/03/2022 10:42   CT CHEST WO CONTRAST  Result Date: 03/02/2022 CLINICAL DATA:  Chest wall pain, nontraumatic, infection or inflammation suspected, xray done pleural effusion, poss pna +/- rib Fx? EXAM: CT CHEST WITHOUT CONTRAST TECHNIQUE: Multidetector CT imaging of the chest was performed following the standard protocol without IV contrast. RADIATION DOSE REDUCTION: This exam was performed according to the departmental dose-optimization program which includes automated exposure control, adjustment of the mA and/or kV according to patient size and/or use of iterative reconstruction technique. COMPARISON:  May 14, 2021. FINDINGS: Cardiovascular: Cardiomegaly. Trace pericardial fluid, mildly increased in comparison to prior and likely due to fluid overload. LEFT chest cardiac pacing device. Atrial appendage occlusion device. Three-vessel coronary artery atherosclerotic calcifications. Atherosclerotic calcifications of the nonaneurysmal thoracic aorta. Mediastinum/Nodes: Visualized thyroid is unremarkable. No axillary or mediastinal adenopathy. Calcified hilar and subcarinal lymph nodes consistent with sequela of prior granulomatous infection. Lungs/Pleura: Small LEFT pleural effusion. Increased RIGHT basilar centrilobular nodularity. Scattered pulmonary nodules are similar in comparison to prior with representative RIGHT apical pulmonary nodule measuring 3 mm, unchanged (series 3, image 29) and RIGHT upper lobe 3 mm pulmonary nodules, unchanged (series 3, image 39 and 46).  Bibasilar atelectasis. Upper Abdomen: Patulous fluid-filled esophagus. Status post Roux-en-Y. Musculoskeletal: Similar appearance of a sclerotic likely benign fibro-osseous lesion of the LEFT humerus. Nondisplaced rib fracture of the RIGHT lateral fifth rib. Age-indeterminate, favored remote rib fracture of the LEFT anterior second rib. Remote rib fractures of the LEFT fourth rib. Osteopenia. Anasarca. IMPRESSION: 1. Increased RIGHT basilar centrilobular nodularity, likely infectious or inflammatory in etiology. 2. Small LEFT pleural effusion. 3. Nondisplaced rib fracture of the RIGHT lateral fifth rib. 4. Age-indeterminate, favored remote rib fracture of the LEFT anterior second rib. Remote LEFT fourth rib fracture. 5. Patulous fluid-filled esophagus. This places the patient at increased risk for aspiration. 6. Scattered pulmonary nodules measuring up to 3 mm are similar in comparison to prior. No follow-up needed if patient is low-risk (and has no known or suspected primary neoplasm). Non-contrast chest CT can be considered in 12 months if patient is high-risk. This recommendation follows the consensus statement: Guidelines for Management of Incidental Pulmonary Nodules Detected on CT Images: From the Fleischner Society 2017; Radiology 2017; 284:228-243. Aortic Atherosclerosis (ICD10-I70.0). Electronically Signed   By: Valentino Saxon M.D.   On: 03/02/2022 17:08   DG Wrist Complete  Left  Result Date: 03/02/2022 CLINICAL DATA:  Fall walking. EXAM: LEFT WRIST - COMPLETE 3+ VIEW COMPARISON:  None Available. FINDINGS: Ununited old ulnar styloid fracture. Mild degenerative change of the radiocarpal joint and first carpometacarpal joints. There is a minimally displaced transverse fracture of the distal radial metaphysis. Remainder of the exam is unremarkable. IMPRESSION: Minimally displaced transverse fracture of the distal radial metaphysis. Electronically Signed   By: Elberta Fortis M.D.   On: 03/02/2022 14:50    DG Hip Unilat With Pelvis 2-3 Views Left  Result Date: 03/02/2022 CLINICAL DATA:  Fall EXAM: DG HIP (WITH OR WITHOUT PELVIS) 2-3V LEFT COMPARISON:  Pelvis radiographs 05/31/2018 FINDINGS: Postsurgical changes in the right femur are partially imaged. Irregularity of the proximal femur is consistent with the previously seen fracture. There is no evidence of new perihardware fracture, to the level imaged. There is no acute fracture or dislocation on the left. Femoroacetabular alignment is maintained. The SI joints and symphysis pubis are intact. The soft tissues are unremarkable. IMPRESSION: No acute fracture or dislocation. Electronically Signed   By: Lesia Hausen M.D.   On: 03/02/2022 14:47   CT Head Wo Contrast  Result Date: 03/02/2022 CLINICAL DATA:  Fall EXAM: CT HEAD WITHOUT CONTRAST CT CERVICAL SPINE WITHOUT CONTRAST TECHNIQUE: Multidetector CT imaging of the head and cervical spine was performed following the standard protocol without intravenous contrast. Multiplanar CT image reconstructions of the cervical spine were also generated. RADIATION DOSE REDUCTION: This exam was performed according to the departmental dose-optimization program which includes automated exposure control, adjustment of the mA and/or kV according to patient size and/or use of iterative reconstruction technique. COMPARISON:  CT head and cervical spine 12/04/2021 FINDINGS: CT HEAD FINDINGS Brain: There is no acute intracranial hemorrhage, extra-axial fluid collection, or acute infarct. Parenchymal volume is within expected limits for age. The ventricles are normal in size. Gray-white differentiation is preserved There is no mass lesion.  There is no mass effect or midline shift. Vascular: No hyperdense vessel or unexpected calcification. Skull: Normal. Negative for fracture or focal lesion. Sinuses/Orbits: The paranasal sinuses are clear. Bilateral lens implants are in place. The globes and orbits are otherwise unremarkable.  Other: Right frontal scalp swelling has nearly resolved. CT CERVICAL SPINE FINDINGS Alignment: Slight reversal of the normal cervical curvature with trace anterolisthesis of C4 on C5 is unchanged. There is no jumped or perched facet. There is no other evidence of acute traumatic malalignment. Skull base and vertebrae: Skull base alignment is maintained. Vertebral body heights are preserved. There is no evidence of acute fracture. There is no suspicious osseous lesion. There is unchanged partial fusion of the C4-C5 vertebral bodies. Soft tissues and spinal canal: No prevertebral fluid or swelling. No visible canal hematoma. Disc levels: Disc space narrowing degenerative endplate change most advanced at C4-C5 and C5-C6 is unchanged. There is overall relatively mild multilevel facet arthropathy, most advanced on the left at C3-C4. There is unchanged moderate spinal canal stenosis at C3-C4. Upper chest: Is a left pleural effusion, incompletely imaged but new/increased since September. Other: None. IMPRESSION: 1. No acute intracranial pathology. 2. No acute fracture or traumatic malalignment of the cervical spine. 3. Incompletely imaged left pleural effusion, new/increased since September. Electronically Signed   By: Lesia Hausen M.D.   On: 03/02/2022 14:42   CT Cervical Spine Wo Contrast  Result Date: 03/02/2022 CLINICAL DATA:  Fall EXAM: CT HEAD WITHOUT CONTRAST CT CERVICAL SPINE WITHOUT CONTRAST TECHNIQUE: Multidetector CT imaging of the head and cervical spine  was performed following the standard protocol without intravenous contrast. Multiplanar CT image reconstructions of the cervical spine were also generated. RADIATION DOSE REDUCTION: This exam was performed according to the departmental dose-optimization program which includes automated exposure control, adjustment of the mA and/or kV according to patient size and/or use of iterative reconstruction technique. COMPARISON:  CT head and cervical spine  12/04/2021 FINDINGS: CT HEAD FINDINGS Brain: There is no acute intracranial hemorrhage, extra-axial fluid collection, or acute infarct. Parenchymal volume is within expected limits for age. The ventricles are normal in size. Gray-white differentiation is preserved There is no mass lesion.  There is no mass effect or midline shift. Vascular: No hyperdense vessel or unexpected calcification. Skull: Normal. Negative for fracture or focal lesion. Sinuses/Orbits: The paranasal sinuses are clear. Bilateral lens implants are in place. The globes and orbits are otherwise unremarkable. Other: Right frontal scalp swelling has nearly resolved. CT CERVICAL SPINE FINDINGS Alignment: Slight reversal of the normal cervical curvature with trace anterolisthesis of C4 on C5 is unchanged. There is no jumped or perched facet. There is no other evidence of acute traumatic malalignment. Skull base and vertebrae: Skull base alignment is maintained. Vertebral body heights are preserved. There is no evidence of acute fracture. There is no suspicious osseous lesion. There is unchanged partial fusion of the C4-C5 vertebral bodies. Soft tissues and spinal canal: No prevertebral fluid or swelling. No visible canal hematoma. Disc levels: Disc space narrowing degenerative endplate change most advanced at C4-C5 and C5-C6 is unchanged. There is overall relatively mild multilevel facet arthropathy, most advanced on the left at C3-C4. There is unchanged moderate spinal canal stenosis at C3-C4. Upper chest: Is a left pleural effusion, incompletely imaged but new/increased since September. Other: None. IMPRESSION: 1. No acute intracranial pathology. 2. No acute fracture or traumatic malalignment of the cervical spine. 3. Incompletely imaged left pleural effusion, new/increased since September. Electronically Signed   By: Valetta Mole M.D.   On: 03/02/2022 14:42    Anti-infectives: Anti-infectives (From admission, onward)    None         Assessment/Plan 30M s/p GLF R 5th Rib fx - pain control, IS, pulm toilet  L hip pain and bruising - XR and CT negative for fx. Mobilize with theapies.  L radius fx - pain control, hand surgery appears to have been consulted yesterday per notes but don't see any recs. Will reach back out - Dr. Tempie Donning, I spoke with his office. Currently in sugartong splint. Would keep nwb until final recs.  Left anterior abdominal wall hematoma that connects to a flank hematoma - add abdominal binder.  ABL anemia - likely 2/2 above. D/c Lovenox. Patient already has 1U PRBC ordered. Home vitamin C and Iron added. Trend hgb (post transfusion hgb and am labs).  B/l ankle feet/pain - No particular area of point ttp. No obvious swelling or deformity. He has intact rom and is NVI. 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 Suspect 2/2 this. Will see how he does mobilizing. If worsening pain with ambulation can get xrays.  Incidental findings - pulm nodules, will need f/u with pcp.  Chronic pain - home home suboxone.  Hx A. Fib  Hx Gastric bypass - multivitamin. Avoid nsaids Hx OSA - CPAP OHS Hx Pacemaker FEN - regular diet, add bowel regimen DVT - SCDs, hold Lovenox given abl anemia.  ID - none Foley - none, voiding Dispo - Transfuse PRBC. Hand re-consult. Therapies rec SNF. PTA, pt lives with spouse using rollator for ambulation. He is agreeable  to this. Updated his wife over the phone.   Addendum - spoke with Dr. Frazier Butt. Plan non-op, splint and follow up in the office.    LOS: 0 days    Jacinto Halim , [facility] 03/04/2022, 10:16 AM Please see Amion for pager number during day hours 7:00am-4:30pm

## 2022-03-04 NOTE — Progress Notes (Signed)
Date and time results received: 03/04/22 0845 (use smartphrase ".now" to insert current time)  Test: HGB Critical Value: 6.0  Name of Provider Notified: Noel Christmas  Orders Received? Or Actions Taken?: Orders Received - See Orders for details will be getting 1 unit of blood

## 2022-03-04 NOTE — NC FL2 (Signed)
[place] LEVEL OF CARE FORM     IDENTIFICATION  Patient Name: Jack Neal Birthdate: 05/03/1945 Sex: male Admission Date (Current Location): 03/02/2022  [facility] and [place] Number:  Herbalist and Address:  The [place], [place] [address], [place], [place] [address]      Provider Number: [id number]  Attending Physician Name and Address:  Dr. Reather Laurence, MD Relative Name and Phone Number:  Abyan Pearo, spouse: [phone]    Current Level of Care: Hospital Recommended Level of Care: [place] Prior Approval Number:    Date Approved/Denied:   PASRR Number: [id number]  Discharge Plan: SNF    Current Diagnoses: Patient Active Problem List   Diagnosis Date Noted   Rib fracture 03/02/2022   Chronic posttraumatic stress disorder    Mild cognitive impairment    Panic disorder with agoraphobia and moderate panic attacks    Intertrochanteric fracture of right hip ([place]) 05/20/2021   Thrombocytopenia ([place]) 05/16/2021   Hyponatremia 05/16/2021   Abnormal thyroid function test 05/16/2021   Dysuria 05/16/2021   Malnutrition of moderate degree 05/16/2021   Closed right hip fracture ([place]) 05/15/2021   Opioid dependence ([place]) 05/15/2021   Dementia without behavioral disturbance ([place]) 05/15/2021   Pancreatic insufficiency 05/15/2021   Closed fracture of acromion 05/15/2021   Macrocytic anemia 05/15/2021   Constipation 05/15/2021   Atrial fibrillation ([place]) 01/02/2021   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   Permanent atrial fibrillation ([place]) 12/23/2020   Hallucination    Spell of abnormal behavior    AMS (altered mental status) 11/09/2020   Fall 10/05/2020   Episode of shaking    Abnormal head CT    Generalized weakness 10/01/2020   Unsteady gait when walking 10/01/2020   AF (paroxysmal atrial fibrillation) ([place]) 10/01/2020   Status post biventricular pacemaker 10/01/2020    Peripheral polyneuropathy 10/01/2020   Major depressive disorder 10/01/2020   OSA on CPAP 10/01/2020   Chronic pain syndrome 10/01/2020   Celiac disease 10/01/2020   History of gastric bypass 10/01/2020    Orientation RESPIRATION BLADDER Height & Weight     Self, Time, Situation, Place  Normal Continent Weight: 81.6 kg Height:  5\' 10"  (177.8 cm)  BEHAVIORAL SYMPTOMS/MOOD NEUROLOGICAL BOWEL NUTRITION STATUS      Continent Diet (regular, thin liquids)  AMBULATORY STATUS COMMUNICATION OF NEEDS Skin   Limited Assist Verbally Bruising (abdomen, arm, scrotum)                       Personal Care Assistance Level of Assistance  Bathing, Feeding, Dressing Bathing Assistance: Limited assistance Feeding assistance: Independent Dressing Assistance: Limited assistance     Functional Limitations Info             SPECIAL CARE FACTORS FREQUENCY  PT (By licensed PT), OT (By licensed OT)     PT Frequency: 5x weekly OT Frequency: 5x weekly            Contractures Contractures Info: Not present    Additional Factors Info  Code Status, Allergies Code Status Info: Full code Allergies Info: Amphetamine-rash, AMS; Diazepam-rash, amnesia, MS changes; Zoloft-anxiety, rahs, panic attacks, cardiac arrest; Gabapentin-tremors, jerking, hallucinations, dry mouth; Gluten meal-diarrhea; Penicillin G-rash           Current Medications (03/04/2022):  This is the current hospital active medication list Current Facility-Administered Medications  Medication Dose Route Frequency Provider Last Rate Last Admin   acetaminophen (TYLENOL)  tablet 1,000 mg  1,000 mg Oral Q6H Diamantina Monks, MD   1,000 mg at 03/04/22 1115   ascorbic acid (VITAMIN C) tablet 500 mg  500 mg Oral BID Jacinto Halim, PA-C   500 mg at 03/04/22 1115   bumetanide (BUMEX) tablet 4 mg  4 mg Oral Daily PRN Diamantina Monks, MD       buprenorphine (SUBUTEX) SL tablet 2 mg  2 mg Sublingual Daily Diamantina Monks, MD   2 mg  at 03/04/22 3825   docusate sodium (COLACE) capsule 100 mg  100 mg Oral BID Diamantina Monks, MD   100 mg at 03/04/22 0539   [START ON 03/05/2022] ferrous sulfate tablet 325 mg  325 mg Oral Q breakfast Maczis, Elmer Sow, PA-C       lidocaine (LIDODERM) 5 % 1 patch  1 patch Transdermal Q24H Diamantina Monks, MD   1 patch at 03/03/22 1747   methocarbamol (ROBAXIN) tablet 1,000 mg  1,000 mg Oral Q8H Diamantina Monks, MD   1,000 mg at 03/04/22 1244   multivitamin with minerals tablet 1 tablet  1 tablet Oral Daily Jacinto Halim, PA-C   1 tablet at 03/04/22 1115   ondansetron (ZOFRAN-ODT) disintegrating tablet 4 mg  4 mg Oral Q6H PRN Diamantina Monks, MD       Or   ondansetron (ZOFRAN) injection 4 mg  4 mg Intravenous Q6H PRN Diamantina Monks, MD       pantoprazole (PROTONIX) EC tablet 40 mg  40 mg Oral Daily Jacinto Halim, PA-C   40 mg at 03/04/22 1115   polyethylene glycol (MIRALAX / GLYCOLAX) packet 17 g  17 g Oral Daily Jacinto Halim, PA-C   17 g at 03/04/22 1115   traMADol (ULTRAM) tablet 25-50 mg  25-50 mg Oral Q6H PRN Jacinto Halim, PA-C         Discharge Medications: Please see discharge summary for a list of discharge medications.  Relevant Imaging Results:  Relevant Lab Results:   Additional Information SS# [id number]  Quintella Baton, RN, BSN  Trauma/Neuro ICU Case Manager [phone]

## 2022-03-04 NOTE — TOC Initial Note (Signed)
Transition of Care [facility]) - Initial/Assessment Note    Patient Details  Name: Jack Neal MRN: [id number] Date of Birth: June 14, 1945  Transition of [facility]) CM/SW Contact:    Jack Mac, RN Phone Number: 03/04/2022, 4:19 PM  Clinical Narrative:                 Patient is a 76 y/o male who presents with left distal radius fx, right 5th rib fx and left hip pain s/p falling over his rollator after hitting a curb.  Prior to admission, patient independent and living at home with spouse.  Patient active with [facility] for PT/OT, and uses rollator for ambulation.  He states that his wife and daughter assist with care as needed; admits to having multiple falls recently.  Patient's PCP is Jack Neal through the [facility].  He states he does have some affiliation with [facility] skilled nursing facility, and would be agreeable to going to rehab at this facility.  I spoke with his wife, Jack Neal, and she agrees to this plan.  Will initiate FL-2 and fax out to area SNFs for bed search.  Will provide bed offers when available. Note: Patient will need 3 night inpatient qualifying stay for Medicare, with tonight being the first MN.    Planned Disposition: Skilled Nursing Facility Barriers to Discharge: Continued Medical Work up   Patient Goals and CMS Choice   CMS Medicare.gov Compare Post Acute Care list provided to:: Patient Choice offered to / list presented to : Patient, Spouse      Expected Discharge Plan and Services Planned Disposition: Skilled Nursing Facility   Discharge Planning Services: CM Consult Post Acute Care Choice: Skilled Nursing Facility Living arrangements for the past 2 months: Apartment                                      Prior Living Arrangements/Services Living arrangements for the past 2 months: Apartment Lives with:: Spouse Patient language and need for interpreter reviewed:: Yes Do you feel safe going back to the place where you  live?: Yes      Need for Family Participation in Patient Care: Yes (Comment) Care giver support system in place?: Yes (comment)   Criminal Activity/Legal Involvement Pertinent to Current Situation/Hospitalization: No - Comment as needed  Activities of Daily Living Home Assistive Devices/Equipment: None ADL Screening (condition at time of admission) Patient's cognitive ability adequate to safely complete daily activities?: Yes Is the patient deaf or have difficulty hearing?: No Does the patient have difficulty seeing, even when wearing glasses/contacts?: No Does the patient have difficulty concentrating, remembering, or making decisions?: No Patient able to express need for assistance with ADLs?: No Does the patient have difficulty dressing or bathing?: No Independently performs ADLs?: Yes (appropriate for developmental age) Does the patient have difficulty walking or climbing stairs?: No Weakness of Legs: None Weakness of Arms/Hands: Left                 Emotional Assessment Appearance:: Appears stated age Attitude/Demeanor/Rapport: Engaged Affect (typically observed): Accepting Orientation: : Oriented to Self, Oriented to Place, Oriented to  Time, Oriented to Situation      Admission diagnosis:  Rib fracture [S22.39XA] Fall, initial encounter [W19.XXXA] Patient Active Problem List   Diagnosis Date Noted   Rib fracture 03/02/2022   Chronic posttraumatic stress disorder    Mild cognitive impairment    Panic disorder  with agoraphobia and moderate panic attacks    Intertrochanteric fracture of right hip ([name]) 05/20/2021   Thrombocytopenia ([place]) 05/16/2021   Hyponatremia 05/16/2021   Abnormal thyroid function test 05/16/2021   Dysuria 05/16/2021   Malnutrition of moderate degree 05/16/2021   Closed right hip fracture ([place]) 05/15/2021   Opioid dependence ([place]) 05/15/2021   Dementia without behavioral disturbance ([place]) 05/15/2021   Pancreatic insufficiency 05/15/2021    Closed fracture of acromion 05/15/2021   Macrocytic anemia 05/15/2021   Constipation 05/15/2021   Atrial fibrillation ([place]) 01/02/2021   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   Permanent atrial fibrillation ([place]) 12/23/2020   Hallucination    Spell of abnormal behavior    AMS (altered mental status) 11/09/2020   Fall 10/05/2020   Episode of shaking    Abnormal head CT    Generalized weakness 10/01/2020   Unsteady gait when walking 10/01/2020   AF (paroxysmal atrial fibrillation) ([place]) 10/01/2020   Status post biventricular pacemaker 10/01/2020   Peripheral polyneuropathy 10/01/2020   Major depressive disorder 10/01/2020   OSA on CPAP 10/01/2020   Chronic pain syndrome 10/01/2020   Celiac disease 10/01/2020   History of gastric bypass 10/01/2020   PCP:  Clinic, [place]:   [place], [place] [facility] [address] [place] [address] Phone: [phone] Fax: [phone]     Social Determinants of Health (SDOH) Social History: SDOH Screenings   Food Insecurity: No Food Insecurity (03/03/2022)  Housing: Low Risk  (03/03/2022)  Transportation Needs: No Transportation Needs (03/03/2022)  Utilities: Not At Risk (03/03/2022)  Tobacco Use: Medium Risk (03/03/2022)   SDOH Interventions: Housing Interventions: Intervention Not Indicated   Readmission Risk Interventions     No data to display         Jack Raddle, RN, BSN  Trauma/Neuro ICU Case Manager [phone]

## 2022-03-05 LAB — CBC
HCT: 18.2 % — ABNORMAL LOW (ref 39.0–52.0)
Hemoglobin: 6.1 g/dL — CL (ref 13.0–17.0)
MCH: 34.7 pg — ABNORMAL HIGH (ref 26.0–34.0)
MCHC: 33.5 g/dL (ref 30.0–36.0)
MCV: 103.4 fL — ABNORMAL HIGH (ref 80.0–100.0)
Platelets: 178 10*3/uL (ref 150–400)
RBC: 1.76 MIL/uL — ABNORMAL LOW (ref 4.22–5.81)
RDW: 18.1 % — ABNORMAL HIGH (ref 11.5–15.5)
WBC: 4.6 10*3/uL (ref 4.0–10.5)
nRBC: 0 % (ref 0.0–0.2)

## 2022-03-05 LAB — BASIC METABOLIC PANEL
Anion gap: 6 (ref 5–15)
BUN: 19 mg/dL (ref 8–23)
CO2: 32 mmol/L (ref 22–32)
Calcium: 7.7 mg/dL — ABNORMAL LOW (ref 8.9–10.3)
Chloride: 97 mmol/L — ABNORMAL LOW (ref 98–111)
Creatinine, Ser: 0.85 mg/dL (ref 0.61–1.24)
GFR, Estimated: >60 mL/min (ref >60)
Glucose, Bld: 101 mg/dL — ABNORMAL HIGH (ref 70–99)
Potassium: 4.1 mmol/L (ref 3.5–5.1)
Sodium: 135 mmol/L (ref 135–145)

## 2022-03-05 LAB — HEMOGLOBIN AND HEMATOCRIT, BLOOD
HCT: 25.8 % — ABNORMAL LOW (ref 39.0–52.0)
Hemoglobin: 8.4 g/dL — ABNORMAL LOW (ref 13.0–17.0)

## 2022-03-05 LAB — PREPARE RBC (CROSSMATCH)

## 2022-03-05 MED ORDER — GLYCERIN (LAXATIVE) 2 G RE SUPP
1.0000 | Freq: Every day | RECTAL | Status: DC | PRN
  Administered 2022-03-10: 1 via RECTAL
  Filled 2022-03-05 (×2): qty 1

## 2022-03-05 MED ORDER — SODIUM CHLORIDE 0.9% IV SOLUTION: Freq: Once | INTRAVENOUS | Status: AC

## 2022-03-05 MED ORDER — GLYCERIN (LAXATIVE) 2 G RE SUPP
1.0000 | Freq: Once | RECTAL | Status: AC
  Administered 2022-03-05: 1 via RECTAL
  Filled 2022-03-05: qty 1

## 2022-03-05 NOTE — Progress Notes (Signed)
Patient stated he was not going to wear the CPAP at bedside tonight.  RT will continue to monitor.

## 2022-03-05 NOTE — Progress Notes (Signed)
Subjective: Patient requests a glycerin suppository today to help with BM.  He is tolerating his regular diet without N/V.  Has baseline dizziness and vertigo, but not usually laying down.  Had some vertigo in bed yesterday after therapies.  Walked about 5 feet but felt weak and dizzy.  Denies much in the way of pain.  Objective: Vital signs in last 24 hours: Temp:  [98.1 F (36.7 C)-99.7 F (37.6 C)] 98.1 F (36.7 C) (12/21 0824) Pulse Rate:  [70-82] 80 (12/21 0824) Resp:  [17-18] 17 (12/21 0824) BP: (94-103)/(54-65) 101/54 (12/21 0824) SpO2:  [93 %-100 %] 98 % (12/21 0824) Last BM Date : 03/02/22  Intake/Output from previous day: 12/20 0701 - 12/21 0700 In: 830 [P.O.:420; I.V.:38; Blood:372] Out: 2250 [Urine:2250] Intake/Output this shift: Total I/O In: -  Out: 500 [Urine:500]  PE: Gen:  Alert, NAD, pleasant HEENT: EOM's intact, pupils equal and round Card:  Reg rate Pulm:  CTAB, no W/R/R, effort normal. On RA. R chest wall ttp.  Abd: Soft, ND, NT, +BS. He does have noted bruising/hematoma over the left lateral abdominal wall/flank that is ttp, but mild today. The area is soft (not tense/tight) and without overlying erythema or heat. Ext: L forearm splint in place. Moves all digits, wwp.  MAEs otherwise Psych: A&Ox3  Neuro: non-focal  Lab Results:  Recent Labs    03/04/22 0704 03/04/22 1737 03/05/22 0324  WBC 5.2  --  4.6  HGB 6.0* 6.4* 6.1*  HCT 18.2* 19.3* 18.2*  PLT 205  --  178   BMET Recent Labs    03/04/22 0704 03/05/22 0324  NA 135 135  K 3.8 4.1  CL 94* 97*  CO2 33* 32  GLUCOSE 90 101*  BUN 16 19  CREATININE 0.87 0.85  CALCIUM 7.7* 7.7*   PT/INR No results for input(s): "LABPROT", "INR" in the last 72 hours. CMP     Component Value Date/Time   NA 135 03/05/2022 0324   NA 142 02/10/2021 1335   K 4.1 03/05/2022 0324   CL 97 (L) 03/05/2022 0324   CO2 32 03/05/2022 0324   GLUCOSE 101 (H) 03/05/2022 0324   BUN 19 03/05/2022 0324    BUN 17 02/10/2021 1335   CREATININE 0.85 03/05/2022 0324   CALCIUM 7.7 (L) 03/05/2022 0324   PROT 5.2 (L) 05/21/2021 0540   ALBUMIN 2.7 (L) 05/21/2021 0540   AST 22 05/21/2021 0540   ALT 10 05/21/2021 0540   ALKPHOS 92 05/21/2021 0540   BILITOT 1.7 (H) 05/21/2021 0540   GFRNONAA >60 03/05/2022 0324   Lipase  No results found for: "LIPASE"  Studies/Results: CT ABDOMEN PELVIS W CONTRAST  Result Date: 03/03/2022 CLINICAL DATA:  Acute pelvic pain. EXAM: CT ABDOMEN AND PELVIS WITH CONTRAST TECHNIQUE: Multidetector CT imaging of the abdomen and pelvis was performed using the standard protocol following bolus administration of intravenous contrast. RADIATION DOSE REDUCTION: This exam was performed according to the departmental dose-optimization program which includes automated exposure control, adjustment of the mA and/or kV according to patient size and/or use of iterative reconstruction technique. CONTRAST:  71mL OMNIPAQUE IOHEXOL 350 MG/ML SOLN COMPARISON:  CT chest, abdomen, and pelvis 05/14/2021 FINDINGS: Lower chest: Small left and trace right pleural effusions with associated curvilinear bilateral lower lobe subsegmental atelectasis and heart size is mildly to moderately enlarged. Cardiac pacer wires are noted. Hepatobiliary: Smooth liver contours. Scattered calcific densities within the liver, similar to prior. Status post cholecystectomy. Mild central intrahepatic biliary  ductal dilatation is similar to prior and presumably postsurgical. Pancreas: Mild pancreatic atrophy, unchanged from prior and within normal limits for patient age. Spleen: Numerous dystrophic calcifications within the spleen, likely the sequela of remote granulomatous infection. Adrenals/Urinary Tract: Normal adrenals. 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 Unchanged tiny subcentimeter low-attenuation lesion within the medial aspect of the lower pole of the right kidney, too small to further characterize. No follow-up imaging is recommended. No focal urinary bladder wall thickening. Stomach/Bowel: Moderate stool is seen throughout the colon compatible with mild constipation. The terminal ileum is unremarkable. The appendix is not confidently identified, however no inflammatory changes are seen around the cecum to indicate secondary signs of acute appendicitis. Postsurgical changes are again seen of gastric 5 has. No dilated loops of small bowel are seen to indicate bowel obstruction. Vascular/Lymphatic: No abdominal aortic aneurysm. High-grade atherosclerotic calcifications. The major intra-aortic branch vessels are patent. No mesenteric, retroperitoneal, or pelvic lymphadenopathy. Reproductive: The prostate is unremarkable. There again appears to be on the right seminal vesicle, consistent with congenital absence of the left kidney. Other: No ventral abdominal wall hernia is seen. There is mild left-greater-than-right ventral abdominal wall subcutaneous fat edema and swelling. There is a higher density mildly heterogeneous likely hematoma seen throughout the left anterior abdominal wall extending into the left flank. The left anterior abdominal wall component measures up to approximately 15 x 4 x 8 cm (transverse by AP by craniocaudal; axial series 3, image 50 and sagittal series 7, image 107). This connects to a left flank hematoma that measures up to 5 x 14 x 5 cm (transverse by AP by craniocaudal; axial series 2, image 38 and sagittal series 7, image 134). Musculoskeletal: There is diffuse decreased bone mineralization. Partial visualization of right proximal femoral cephalomedullary nail fixation. There is again a cyst within the left femoral neck measuring up to approximately 1.9 x 2.9 x 4.1 cm (transverse by AP by craniocaudal, similar to 05/14/2021).  When measured in a similar manner on 05/31/2019 CT pelvis, this measured 1.9 x 2.9 x 4.0 cm. Chronic high grade 1 anterolisthesis of L5 on S1 with posterior L5-S1 disc space osseous fusion, unchanged from prior. IMPRESSION: Compared to 05/14/2021: 1. New left-greater-than-right ventral abdominal wall subcutaneous fat edema and swelling. There is a new left anterior abdominal wall hematoma measuring up to 15 x 4 x 8 cm. This connects to a left flank hematoma measuring up to 5 x 14 x 5 cm. 2. Small left and trace right pleural effusions with associated curvilinear bilateral lower lobe subsegmental atelectasis. This is new from 05/14/2021. 3. 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 4. Moderate stool is seen throughout the colon compatible with mild constipation. Electronically Signed   By: Yvonne Kendall M.D.   On: 03/03/2022 10:42    Anti-infectives: Anti-infectives (From admission, onward)    None        Assessment/Plan 75M s/p GLF R 5th Rib fx - pain control, IS, pulm toilet  L hip pain and bruising - XR and CT negative for fx. Mobilize with theapies.  L radius fx - pain control. Non-op management in splint and outpatient follow up in his office. Left anterior abdominal wall hematoma that connects to a flank hematoma - add abdominal binder. This was not placed yesterday.  I spoke  directly to RN today to ask her to get this and place on him at all times. ABL anemia - got a total of 2 units of pRBCs yesterday.  Will give 2 more today due to persistently low hgb from abdominal wall hematoma. B/l ankle feet/pain - Reports baseline neuropathy. Suspect 2/2 this.  Incidental findings - pulm nodules, will need f/u with pcp.  Chronic pain - home home suboxone.  Hx A. Fib  Hx Gastric bypass - multivitamin. Avoid nsaids Hx OSA - CPAP OHS Hx Pacemaker FEN - regular diet, bowel regimen, suppository DVT - SCDs, hold Lovenox given abl anemia.  ID  - none Foley - none, voiding Dispo - Transfuse PRBC. Hand re-consult. Therapies rec SNF. PTA, pt lives with spouse using rollator for ambulation.    LOS: 1 day    Letha Cape , [facility] Surgery 03/05/2022, 9:12 AM Please see Amion for pager number during day hours 7:00am-4:30pm

## 2022-03-05 NOTE — TOC Progression Note (Signed)
Transition of Care [facility]) - Progression Note    Patient Details  Name: Jack Neal MRN: [id number] Date of Birth: 1945/05/11  Transition of Care [facility]) CM/SW Contact  Glennon Mac, RN Phone Number: 03/05/2022, 4:24 PM  Clinical Narrative:    Updated patient and wife, [name] regarding SNF placement; currently no bed offers.  Will send messages to [place], [place], [place] and [facility].    Planned Disposition: Skilled Nursing Facility Barriers to Discharge: Continued Medical Work up  Expected Discharge Plan and Services   Discharge Planning Services: CM Consult Post Acute Care Choice: Skilled Nursing Facility Living arrangements for the past 2 months: Apartment                                       Social Determinants of Health (SDOH) Interventions SDOH Screenings   Food Insecurity: No Food Insecurity (03/03/2022)  Housing: Low Risk  (03/03/2022)  Transportation Needs: No Transportation Needs (03/03/2022)  Utilities: Not At Risk (03/03/2022)  Tobacco Use: Medium Risk (03/03/2022)    Readmission Risk Interventions     No data to display         Quintella Baton, RN, BSN  Trauma/Neuro ICU Case Manager [phone]

## 2022-03-05 NOTE — Progress Notes (Signed)
Physical Therapy Treatment Patient Details Name: Jack Neal MRN: [id number] DOB: July 01, 1945 Today's Date: 03/05/2022   History of Present Illness Patient is a 76 y/o male who presents with left distal radius fx, right 5th rib fx and left hip pain s/p falling over his rollator after hitting a curb. PMH includes PAF, neuropathy, pacemaker, OSA on CPAP, PTSD, depression, chronic pain syndrome.    PT Comments    Pt received in recliner, agreeable to therapy session, blood products infusing >30 mins prior to session and pt reports symptoms improved from early AM with no lightheadedness or nausea or dyspnea during session today. Pt needing up to minA for transfers and gait trial for short household distance using L Platform RW. Pt IV beeping infusion complete so RN called to room to assist him with this at end of session, pt chair alarm on for safety. DME updated below per discussion with supervising PT Otelia Santee as MD Lovick, A cleared him for L elbow WB and pt mobilizing more steadily with L PFWr  Recommendations for follow up therapy are one component of a multi-disciplinary discharge planning process, led by the attending physician.  Recommendations may be updated based on patient status, additional functional criteria and insurance authorization.  Follow Up Recommendations  Skilled nursing-short term rehab (<3 hours/day) Can patient physically be transported by private vehicle: Yes   Assistance Recommended at Discharge Frequent or constant Supervision/Assistance  Patient can return home with the following A little help with walking and/or transfers;A little help with bathing/dressing/bathroom;Assistance with cooking/housework;Help with stairs or ramp for entrance;Assist for transportation   Equipment Recommendations  Rolling walker (2 wheels) (L platform RW)    Recommendations for Other Services       Precautions / Restrictions Precautions Precautions: Fall;Other (comment) Precaution  Comments: hx of falls and vertigo Required Braces or Orthoses: Other Brace (abd binder and LUE sugar tong splint donned) Restrictions Weight Bearing Restrictions: Yes LUE Weight Bearing: Weight bear through elbow only Other Position/Activity Restrictions: per MD Lovick verbal order 12/21     Mobility  Bed Mobility Overal bed mobility: Needs Assistance             General bed mobility comments: pt received up in recliner    Transfers Overall transfer level: Needs assistance Equipment used: Left platform walker Transfers: Sit to/from Stand Sit to Stand: Min guard           General transfer comment: from chair<>L PFRW x 2 trials    Ambulation/Gait Ambulation/Gait assistance: Min assist Gait Distance (Feet): 30 Feet Assistive device: Left platform walker Gait Pattern/deviations: Step-through pattern, Decreased stride length, Narrow base of support       General Gait Details: No c/o vertigo this date. Pt tolerated gait better with L PFRW. No LOB, minA for device management especially during turns and min safety cues. Distance limited due to pt with blood products infusing and infusion complete during gait trial, RN called to room to address IV at end of session.     Balance Overall balance assessment: Needs assistance, History of Falls Sitting-balance support: Feet supported, No upper extremity supported Sitting balance-Leahy Scale: Fair     Standing balance support: During functional activity, Single extremity supported, Reliant on assistive device for balance Standing balance-Leahy Scale: Poor                              Cognition Arousal/Alertness: Awake/alert Behavior During Therapy: WFL for tasks assessed/performed Overall  Cognitive Status: Within Functional Limits for tasks assessed                                 General Comments: Pleasant, cooperative, following 1 and 2-step cues well. 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        Exercises Other Exercises Other Exercises: standing hip flexion x10 reps Other Exercises: STS x3 reps    General Comments General comments (skin integrity, edema, etc.): no dizziness or vertigo reported today; pt first unit of blood infusion complete toward end of session, RN called to room to assist him with this. He reports no symptoms of low Hgb during mobility tasks today.      Pertinent Vitals/Pain Pain Assessment Pain Assessment: Faces Faces Pain Scale: Hurts little more Pain Location: R side, L wrist Pain Descriptors / Indicators: Sore, Discomfort Pain Intervention(s): Monitored during session, Limited activity within patient's tolerance, Repositioned, Ice applied     PT Goals (current goals can now be found in the care plan section) Acute Rehab PT Goals Patient Stated Goal: to get stronger and go home PT Goal Formulation: With patient Time For Goal Achievement: 03/17/22 Progress towards PT goals: Progressing toward goals    Frequency    Min 3X/week      PT Plan Equipment recommendations need to be updated       AM-PAC PT "6 Clicks" Mobility   Outcome Measure  Help needed turning from your back to your side while in a flat bed without using bedrails?: A Little Help needed moving from lying on your back to sitting on the side of a flat bed without using bedrails?: A Lot (wihout rails/flat bed) Help needed moving to and from a bed to a chair (including a wheelchair)?: A Little Help needed standing up from a chair using your arms (e.g., wheelchair or bedside chair)?: A Little Help needed to walk in hospital room?: A Lot (mod cues using L PF RW) Help needed climbing 3-5 steps with a railing? : Total 6 Click Score: 14    End of Session Equipment Utilized During Treatment: Gait belt Activity Tolerance: Patient tolerated treatment well;Other (comment) (pt due for next unit of blood (current infusion just finished during  session)) Patient left: in chair;with call bell/phone within reach;with chair alarm set;with nursing/sitter in room Nurse Communication: Mobility status;Other (comment) (LUE elbow WB only; first unit of blood appears complete, IV beeping) PT Visit Diagnosis: Pain;Unsteadiness on feet (R26.81);Difficulty in walking, not elsewhere classified (R26.2) Pain - Right/Left: Left Pain - part of body: Arm;Hand (L/R flank)     Time: 8329-1916 PT Time Calculation (min) (ACUTE ONLY): 33 min  Charges:  $Gait Training: 8-22 mins $Therapeutic Activity: 8-22 mins                     Markesia Crilly P., PTA Acute Rehabilitation Services Secure Chat Preferred 9a-5:30pm Office: [phone]    Dorathy Kinsman [facility] 03/05/2022, 5:03 PM

## 2022-03-05 NOTE — Progress Notes (Signed)
Mobility Specialist - Progress Note   03/05/22 1600  Mobility  Activity Ambulated with assistance in room  Level of Assistance Contact guard assist, steadying assist  Assistive Device Front wheel walker  Distance Ambulated (ft) 60 ft  LUE Weight Bearing Weight bear through elbow only  Activity Response Tolerated well  $Mobility charge 1 Mobility    Pt received in recliner agreeable to mobility. Used only RUE to push RW, kept LUE at side to maintain WB status. MinG assist to help remain steady. Left in recliner w/ call bell in reach and all needs met.   [place] Specialist Please contact via SecureChat or Rehab office at [phone]

## 2022-03-05 NOTE — Progress Notes (Signed)
Occupational Therapy Treatment Patient Details Name: Jack Neal MRN: [id number] DOB: 1945-10-30 Today's Date: 03/05/2022   History of present illness Patient is a 76 y/o male who presents with left distal radius fx, right 5th rib fx and left hip pain s/p falling over his rollator after hitting a curb. PMH includes PAF, neuropathy, pacemaker, OSA on CPAP, PTSD, depression, chronic pain syndrome.   OT comments  Pt at this time completed bed mobility with supervision, sit to stand transfer with min guard as R IV line limited WB at this time and completed UE dressing/bathing with set up seated and LB dressing/bathing with min guard.  Pt was educated about positioning to reduce fall risks with vertigo as pt was attempting to complete a single leg stance with LB dressing. Pt currently with functional limitations due to the deficits listed below (see OT Problem List).  Pt will benefit from skilled OT to increase their safety and independence with ADL and functional mobility for ADL to facilitate discharge to venue listed below.     Recommendations for follow up therapy are one component of a multi-disciplinary discharge planning process, led by the attending physician.  Recommendations may be updated based on patient status, additional functional criteria and insurance authorization.    Follow Up Recommendations  Skilled nursing-short term rehab (<3 hours/day) but making good progress to [facility] level of care    Assistance Recommended at Discharge Intermittent Supervision/Assistance  Patient can return home with the following  A little help with walking and/or transfers;A little help with bathing/dressing/bathroom;Assistance with cooking/housework   Equipment Recommendations  None recommended by OT    Recommendations for Other Services      Precautions / Restrictions Precautions Precautions: Fall;Other (comment) Precaution Comments: hx of falls and vertigo Restrictions Weight Bearing  Restrictions: Yes LUE Weight Bearing: Weight bear through elbow only Other Position/Activity Restrictions: per MD Lovick verbal order 12/21       Mobility Bed Mobility Overal bed mobility: Needs Assistance Bed Mobility: Supine to Sit     Supine to sit: Supervision, HOB elevated     General bed mobility comments: cues on pacing due to vertigo and low HGB    Transfers Overall transfer level: Needs assistance Equipment used: 1 person hand held assist Transfers: Sit to/from Stand Sit to Stand: Min guard           General transfer comment: due to dizziness     Balance Overall balance assessment: Needs assistance, History of Falls Sitting-balance support: Feet supported, No upper extremity supported Sitting balance-Leahy Scale: Fair     Standing balance support: During functional activity, Single extremity supported Standing balance-Leahy Scale: Poor                             ADL either performed or assessed with clinical judgement   ADL Overall ADL's : Needs assistance/impaired Eating/Feeding: Modified independent;Sitting   Grooming: Set up;Sitting   Upper Body Bathing: Set up;Sitting   Lower Body Bathing: Min guard;Sit to/from stand   Upper Body Dressing : Set up;Sitting   Lower Body Dressing: Min guard;Sit to/from stand   Toilet Transfer: Set up;Cueing for safety;Cueing for sequencing;BSC/3in1   Toileting- Architect and Hygiene: Min guard;Sit to/from stand       Functional mobility during ADLs: Min guard;Cueing for safety;Cueing for sequencing General ADL Comments: completed with hand held at this time as due to LUE IV line bleeding and nurse made aware    Extremity/Trunk  Assessment Upper Extremity Assessment Upper Extremity Assessment: LUE deficits/detail LUE Deficits / Details: Pt was able to complete FM coordination movements in LUE and noted limited swelling distally to L elbow. P LUE Sensation: decreased light touch LUE  Coordination: decreased fine motor;decreased gross motor (at elbow)   Lower Extremity Assessment Lower Extremity Assessment: Defer to PT evaluation        Vision   Vision Assessment?: No apparent visual deficits   Perception     Praxis      Cognition Arousal/Alertness: Awake/alert Behavior During Therapy: WFL for tasks assessed/performed Overall Cognitive Status: Within Functional Limits for tasks assessed                                          Exercises      Shoulder Instructions       General Comments      Pertinent Vitals/ Pain       Pain Assessment Pain Assessment: Faces Faces Pain Scale: Hurts little more Pain Location: R side, L wrist Pain Descriptors / Indicators: Sore, Discomfort Pain Intervention(s): Limited activity within patient's tolerance, Monitored during session, Repositioned  Home Living                                          Prior Functioning/Environment              Frequency  Min 2X/week        Progress Toward Goals  OT Goals(current goals can now be found in the care plan section)  Progress towards OT goals: Progressing toward goals  Acute Rehab OT Goals Patient Stated Goal: to get stronger OT Goal Formulation: With patient Time For Goal Achievement: 03/17/22 Potential to Achieve Goals: Good ADL Goals Pt Will Perform Lower Body Bathing: with modified independence;sit to/from stand Pt Will Perform Lower Body Dressing: with modified independence;sit to/from stand Pt Will Transfer to Toilet: with modified independence;ambulating  Plan Discharge plan remains appropriate    Co-evaluation                 AM-PAC OT "6 Clicks" Daily Activity     Outcome Measure   Help from another person eating meals?: None Help from another person taking care of personal grooming?: A Little Help from another person toileting, which includes using toliet, bedpan, or urinal?: A Little Help from  another person bathing (including washing, rinsing, drying)?: A Little Help from another person to put on and taking off regular upper body clothing?: A Little Help from another person to put on and taking off regular lower body clothing?: A Little 6 Click Score: 19    End of Session Equipment Utilized During Treatment: Gait belt  OT Visit Diagnosis: Other abnormalities of gait and mobility (R26.89);Unsteadiness on feet (R26.81)   Activity Tolerance Patient tolerated treatment well   Patient Left in chair;with call bell/phone within reach;with chair alarm set   Nurse Communication Other (comment) (IV line)        [id number] OT Time Calculation (min): 41 min  Charges: OT General Charges $OT Visit: 1 Visit OT Treatments $Self Care/Home Management : 38-52 mins  Joeseph Amor OTR/L  Acute Rehab Services  [phone] office number   Joeseph Amor 03/05/2022, 9:55 AM

## 2022-03-06 LAB — CBC
HCT: 25.3 % — ABNORMAL LOW (ref 39.0–52.0)
Hemoglobin: 8.4 g/dL — ABNORMAL LOW (ref 13.0–17.0)
MCH: 33.2 pg (ref 26.0–34.0)
MCHC: 33.2 g/dL (ref 30.0–36.0)
MCV: 100 fL (ref 80.0–100.0)
Platelets: 189 10*3/uL (ref 150–400)
RBC: 2.53 MIL/uL — ABNORMAL LOW (ref 4.22–5.81)
RDW: 20.4 % — ABNORMAL HIGH (ref 11.5–15.5)
WBC: 5.4 10*3/uL (ref 4.0–10.5)
nRBC: 0 % (ref 0.0–0.2)

## 2022-03-06 LAB — BASIC METABOLIC PANEL
Anion gap: 11 (ref 5–15)
BUN: 16 mg/dL (ref 8–23)
CO2: 29 mmol/L (ref 22–32)
Calcium: 8.3 mg/dL — ABNORMAL LOW (ref 8.9–10.3)
Chloride: 99 mmol/L (ref 98–111)
Creatinine, Ser: 0.78 mg/dL (ref 0.61–1.24)
GFR, Estimated: >60 mL/min (ref >60)
Glucose, Bld: 94 mg/dL (ref 70–99)
Potassium: 4.2 mmol/L (ref 3.5–5.1)
Sodium: 139 mmol/L (ref 135–145)

## 2022-03-06 NOTE — TOC Progression Note (Addendum)
Transition of Care [facility]) - Progression Note    Patient Details  Name: Jack Neal MRN: [id number] Date of Birth: 1945/07/16  Transition of [facility] CM/SW Contact  Carley Hammed, [place] Phone Number: 03/06/2022, 11:41 AM  Clinical Narrative:    CSW noted pt has a bed offer from [facility]. CSW followed up with Facility, they stated thay didn't realize he was a trauma pt and will need to review his chart again. They will follow up with CSW when they determine if they can still offer a bed. TOC will continue to follow.  [facility] unable to offer a bed at this time, as they have none available. CSW to follow up after the holiday for any openings. Pt cannot DC home with his care needs, as his spouse also has physical constraints. No additional bed offers at this time, most facilities closed until Tuesday. TOC will continue to follow.   Barriers to Discharge: Continued Medical Work up  Expected Discharge Plan and Services   Discharge Planning Services: CM Consult Post Acute Care Choice: Skilled Nursing Facility Living arrangements for the past 2 months: Apartment                                       Social Determinants of Health (SDOH) Interventions SDOH Screenings   Food Insecurity: No Food Insecurity (03/03/2022)  Housing: Low Risk  (03/03/2022)  Transportation Needs: No Transportation Needs (03/03/2022)  Utilities: Not At Risk (03/03/2022)  Tobacco Use: Medium Risk (03/03/2022)    Readmission Risk Interventions     No data to display

## 2022-03-06 NOTE — TOC CAGE-AID Note (Signed)
Transition of Care [facility]) - CAGE-AID Screening   Patient Details  Name: Jack Neal MRN: [id number] Date of Birth: [birth year]  Clinical Narrative:  Patient denies any alcohol or drug use, no need for substance abuse resources at this time.  CAGE-AID Screening:    Have You Ever Felt You Ought to Cut Down on Your Drinking or Drug Use?: No Have People Annoyed You By Critizing Your Drinking Or Drug Use?: No Have You Felt Bad Or Guilty About Your Drinking Or Drug Use?: No Have You Ever Had a Drink or Used Drugs First Thing In The Morning to Steady Your Nerves or to Get Rid of a Hangover?: No CAGE-AID Score: 0  Substance Abuse Education Offered: No

## 2022-03-06 NOTE — Progress Notes (Signed)
Mobility Specialist - Progress Note   03/06/22 1213  Mobility  Activity Ambulated with assistance in hallway  Level of Assistance Contact guard assist, steadying assist  Assistive Device Front wheel walker (Left Platform Walker)  Distance Ambulated (ft) 120 ft  LUE Weight Bearing Weight bear through elbow only  Activity Response Tolerated well  $Mobility charge 1 Mobility    Pt received in recliner agreeable to mobility. No complaints throughout. Left in recliner w/ call bell in reach and all needs met.   [place] Specialist Please contact via SecureChat or Rehab office at [phone]

## 2022-03-06 NOTE — Progress Notes (Signed)
Physical Therapy Treatment Patient Details Name: Jack Neal MRN: [id number] DOB: Nov 25, 1945 Today's Date: 03/06/2022   History of Present Illness Patient is a 76 y/o male who presents with left distal radius fx, right 5th rib fx and left hip pain s/p falling over his rollator after hitting a curb. PMH includes PAF, neuropathy, pacemaker, OSA on CPAP, PTSD, depression, chronic pain syndrome.    PT Comments    Pt received up in chair, agreeable to therapy session and with good participation and improved tolerance for gait and transfer training. Pt needing heavy minA to stand from lower height toilet seat but as little as min guard assist with reciprocal transfer training from chair and use of momentum to achieve upright. Pt progressed to longer household distance gait trials in hallway with L PFRW support, needing up to minA for RW management and stability as pt tending to drift laterally and unable to use L wrist effectively to steer device given only elbow WB allowed. Pt continues to benefit from PT services to progress toward functional mobility goals.    Recommendations for follow up therapy are one component of a multi-disciplinary discharge planning process, led by the attending physician.  Recommendations may be updated based on patient status, additional functional criteria and insurance authorization.  Follow Up Recommendations  Skilled nursing-short term rehab (<3 hours/day) Can patient physically be transported by private vehicle: Yes   Assistance Recommended at Discharge Frequent or constant Supervision/Assistance  Patient can return home with the following A little help with walking and/or transfers;A little help with bathing/dressing/bathroom;Assistance with cooking/housework;Help with stairs or ramp for entrance;Assist for transportation   Equipment Recommendations  Rolling walker (2 wheels) (L platform RW)    Recommendations for Other Services       Precautions /  Restrictions Precautions Precautions: Fall;Other (comment) Precaution Comments: hx of falls and vertigo Required Braces or Orthoses: Other Brace (abd binder and LUE sugar tong splint donned) Restrictions Weight Bearing Restrictions: Yes LUE Weight Bearing: Weight bear through elbow only Other Position/Activity Restrictions: per MD Lovick verbal order 12/21     Mobility  Bed Mobility Overal bed mobility: Needs Assistance             General bed mobility comments: pt received up in recliner, wanted to get back to chair at end of session    Transfers Overall transfer level: Needs assistance Equipment used: Left platform walker Transfers: Sit to/from Stand Sit to Stand: Min guard           General transfer comment: from chair<>L PFRW and low toilet<>L PFRW    Ambulation/Gait Ambulation/Gait assistance: Min assist, Min guard Gait Distance (Feet): 100 Feet (x3 with breaks) Assistive device: Left platform walker Gait Pattern/deviations: Step-through pattern, Decreased stride length, Narrow base of support Gait velocity: grossly 0.2-0.4 m/s     General Gait Details: No c/o vertigo this date. Pt tolerated gait well with L PFRW. No LOB, minA for device management especially during turns and min safety cues. Pt drifting to L/R sides needs some physical assist to manage RW as he fatigued. x2 rest breaks.   Stairs             Wheelchair Mobility    Modified Rankin (Stroke Patients Only)       Balance Overall balance assessment: Needs assistance, History of Falls Sitting-balance support: Feet supported, No upper extremity supported Sitting balance-Leahy Scale: Fair     Standing balance support: During functional activity, Single extremity supported, Reliant on assistive device for balance  Standing balance-Leahy Scale: Poor Standing balance comment: Able to stand statically with Supervision but needs up to Min A for dynamic tasks/gait               High  Level Balance Comments: Some drifting to L/R with head turns but no c/o vertigo sx.            Cognition Arousal/Alertness: Awake/alert Behavior During Therapy: WFL for tasks assessed/performed Overall Cognitive Status: Within Functional Limits for tasks assessed                                 General Comments: Pleasant, cooperative, following 1 and 2-step cues well. 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        Exercises Other Exercises Other Exercises: STS x5 reps reciprocal from chair using only RUE    General Comments General comments (skin integrity, edema, etc.): SpO2 and HR WFL on RA; pt denies vertigo sx      Pertinent Vitals/Pain Pain Assessment Pain Assessment: Faces Pain Score: 5  Faces Pain Scale: Hurts little more Pain Location: L flank, L wrist Pain Descriptors / Indicators: Sore, Discomfort, Guarding Pain Intervention(s): Monitored during session, Repositioned, Premedicated before session     PT Goals (current goals can now be found in the care plan section) Acute Rehab PT Goals Patient Stated Goal: to get stronger and go home PT Goal Formulation: With patient Time For Goal Achievement: 03/17/22 Progress towards PT goals: Progressing toward goals    Frequency    Min 3X/week      PT Plan Equipment recommendations need to be updated       AM-PAC PT "6 Clicks" Mobility   Outcome Measure  Help needed turning from your back to your side while in a flat bed without using bedrails?: A Little Help needed moving from lying on your back to sitting on the side of a flat bed without using bedrails?: A Lot (wihout rails/flat bed) Help needed moving to and from a bed to a chair (including a wheelchair)?: A Little Help needed standing up from a chair using your arms (e.g., wheelchair or bedside chair)?: A Little Help needed to walk in hospital room?: A Lot (mod cues using L PF RW) Help needed climbing 3-5 steps  with a railing? : Total 6 Click Score: 14    End of Session Equipment Utilized During Treatment: Gait belt Activity Tolerance: Patient tolerated treatment well Patient left: in chair;with call bell/phone within reach;with chair alarm set Nurse Communication: Mobility status;Other (comment) (LUE elbow WB only; first unit of blood appears complete, IV beeping) PT Visit Diagnosis: Pain;Unsteadiness on feet (R26.81);Difficulty in walking, not elsewhere classified (R26.2) Pain - Right/Left: Left Pain - part of body: Arm;Hand (L/R flank)     Time: 1540-1620 PT Time Calculation (min) (ACUTE ONLY): 40 min  Charges:  $Gait Training: 23-37 mins $Therapeutic Activity: 8-22 mins                     Mahoganie Basher P., PTA Acute Rehabilitation Services Secure Chat Preferred 9a-5:30pm Office: [place] 03/06/2022, 5:37 PM

## 2022-03-06 NOTE — Progress Notes (Addendum)
Subjective: Able to ambulate to the restroom overnight and had no dizziness and felt significantly less weak.  Upset this his suboxone is given at 10am instead of 6 am.  Otherwise no other complaints.  Coralee Pesa is not in place and is got soaked with urine overnight.  Objective: Vital signs in last 24 hours: Temp:  [97.6 F (36.4 C)-98.9 F (37.2 C)] 98.1 F (36.7 C) (12/22 0843) Pulse Rate:  [72-86] 80 (12/22 0843) Resp:  [16-18] 17 (12/22 0843) BP: (92-118)/(59-94) 103/66 (12/22 0843) SpO2:  [96 %-100 %] 100 % (12/22 0843) Last BM Date : 03/02/22  Intake/Output from previous day: 12/21 0701 - 12/22 0700 In: 857.5 [P.O.:502; Blood:355.5] Out: 1350 [Urine:1350] Intake/Output this shift: Total I/O In: -  Out: 200 [Urine:200]  PE: Gen:  Alert, NAD, pleasant HEENT: EOM's intact, pupils equal and round Card:  Reg rate Pulm:  normal respiratory effort Abd: Soft, ND, NT, +BS. He does have noted bruising/hematoma over the left lateral abdominal wall/flank that is ttp, but mild today. The area is much softer today than yesterday Ext: L forearm splint in place. Moves all digits, wwp.  MAEs otherwise Psych: A&Ox3  Neuro: non-focal  Lab Results:  Recent Labs    03/05/22 0324 03/05/22 1755 03/06/22 0255  WBC 4.6  --  5.4  HGB 6.1* 8.4* 8.4*  HCT 18.2* 25.8* 25.3*  PLT 178  --  189   BMET Recent Labs    03/05/22 0324 03/06/22 0255  NA 135 139  K 4.1 4.2  CL 97* 99  CO2 32 29  GLUCOSE 101* 94  BUN 19 16  CREATININE 0.85 0.78  CALCIUM 7.7* 8.3*   PT/INR No results for input(s): "LABPROT", "INR" in the last 72 hours. CMP     Component Value Date/Time   NA 139 03/06/2022 0255   NA 142 02/10/2021 1335   K 4.2 03/06/2022 0255   CL 99 03/06/2022 0255   CO2 29 03/06/2022 0255   GLUCOSE 94 03/06/2022 0255   BUN 16 03/06/2022 0255   BUN 17 02/10/2021 1335   CREATININE 0.78 03/06/2022 0255   CALCIUM 8.3 (L) 03/06/2022 0255   PROT 5.2 (L) 05/21/2021 0540    ALBUMIN 2.7 (L) 05/21/2021 0540   AST 22 05/21/2021 0540   ALT 10 05/21/2021 0540   ALKPHOS 92 05/21/2021 0540   BILITOT 1.7 (H) 05/21/2021 0540   GFRNONAA >60 03/06/2022 0255   Lipase  No results found for: "LIPASE"  Studies/Results: No results found.  Anti-infectives: Anti-infectives (From admission, onward)    None        Assessment/Plan 37M s/p GLF R 5th Rib fx - pain control, IS, pulm toilet  L hip pain and bruising - XR and CT negative for fx. Mobilize with theapies.  L radius fx - pain control. Non-op management in splint and outpatient follow up in his office. Left anterior abdominal wall hematoma that connects to a flank hematoma - obtain new abdominal binder.  ABL anemia - got a total of 2 units of pRBCs 12/20, 12/21.  Good response with hgb just over 8 today.  Symptomatically much improved B/l ankle feet/pain - Reports baseline neuropathy. Suspect 2/2 this.  Incidental findings - pulm nodules, will need f/u with pcp.  Chronic pain - home suboxone. D/w pharmacy who will adjust scheduling to 0600am per patient request. Hx A. Fib  Hx Gastric bypass - multivitamin. Avoid nsaids Hx OSA - CPAP OHS Hx Pacemaker FEN - regular  diet, bowel regimen, suppository prn DVT - SCDs, hold Lovenox given abl anemia.  ID - none Foley - none, voiding Dispo -Therapies rec SNF. Medically stable for SNF when bed available   LOS: 2 days    Letha Cape , [facility] 03/06/2022, 9:06 AM Please see Amion for pager number during day hours 7:00am-4:30pm

## 2022-03-07 LAB — CBC
HCT: 24.6 % — ABNORMAL LOW (ref 39.0–52.0)
Hemoglobin: 8.1 g/dL — ABNORMAL LOW (ref 13.0–17.0)
MCH: 33.5 pg (ref 26.0–34.0)
MCHC: 32.9 g/dL (ref 30.0–36.0)
MCV: 101.7 fL — ABNORMAL HIGH (ref 80.0–100.0)
Platelets: 189 10*3/uL (ref 150–400)
RBC: 2.42 MIL/uL — ABNORMAL LOW (ref 4.22–5.81)
RDW: 18.9 % — ABNORMAL HIGH (ref 11.5–15.5)
WBC: 5.1 10*3/uL (ref 4.0–10.5)
nRBC: 0 % (ref 0.0–0.2)

## 2022-03-07 NOTE — Progress Notes (Signed)
Mobility Specialist: Progress Note   03/07/22 1534  Mobility  Activity Ambulated with assistance in hallway  Level of Assistance Contact guard assist, steadying assist  Assistive Device  Investment banker, operational)  Distance Ambulated (ft) 550 ft  LUE Weight Bearing Weight bear through elbow only  Activity Response Tolerated well  Mobility Referral Yes  $Mobility charge 1 Mobility   Post-Mobility: 81 HR, 119/79 (91) BP, 100% SpO2  Pt received in the chair and agreeable to mobility. Mod I to stand and contact guard during ambulation. Stopped x2 for standing breaks secondary to mild SOB and fatigue. C/o dizziness throughout but pt states "that's normal for me." VSS. Pt back to the chair after session with call bell and phone in reach.   Lesly Joslyn Mobility Specialist Please contact via SecureChat or Rehab office at [phone]

## 2022-03-07 NOTE — Progress Notes (Addendum)
       Subjective: No complaints today after suboxone time change.  Eating well.    Objective: Vital signs in last 24 hours: Temp:  [97.7 F (36.5 C)-98.1 F (36.7 C)] 97.7 F (36.5 C) (12/23 0823) Pulse Rate:  [73-88] 81 (12/23 0823) Resp:  [17-18] 18 (12/23 0823) BP: (108-126)/(64-77) 113/77 (12/23 0823) SpO2:  [98 %-100 %] 99 % (12/23 0823) Last BM Date : 03/06/22  Intake/Output from previous day: 12/22 0701 - 12/23 0700 In: 960 [P.O.:960] Out: 1850 [Urine:1850] Intake/Output this shift: Total I/O In: 220 [P.O.:220] Out: 500 [Urine:500]  PE: Gen:  Alert, NAD, pleasant Card:  Reg rate Pulm:  normal respiratory effort Abd: Soft, ND, NT, +BS. He does have noted bruising/hematoma over the left lateral abdominal wall/flank that is ttp, but mild today. The area is much softer today than yesterday Ext: L forearm splint in place. Moves all digits, wwp.  MAEs otherwise Psych: A&Ox3  Neuro: non-focal  Lab Results:  Recent Labs    03/06/22 0255 03/07/22 0207  WBC 5.4 5.1  HGB 8.4* 8.1*  HCT 25.3* 24.6*  PLT 189 189   BMET Recent Labs    03/05/22 0324 03/06/22 0255  NA 135 139  K 4.1 4.2  CL 97* 99  CO2 32 29  GLUCOSE 101* 94  BUN 19 16  CREATININE 0.85 0.78  CALCIUM 7.7* 8.3*   PT/INR No results for input(s): "LABPROT", "INR" in the last 72 hours. CMP     Component Value Date/Time   NA 139 03/06/2022 0255   NA 142 02/10/2021 1335   K 4.2 03/06/2022 0255   CL 99 03/06/2022 0255   CO2 29 03/06/2022 0255   GLUCOSE 94 03/06/2022 0255   BUN 16 03/06/2022 0255   BUN 17 02/10/2021 1335   CREATININE 0.78 03/06/2022 0255   CALCIUM 8.3 (L) 03/06/2022 0255   PROT 5.2 (L) 05/21/2021 0540   ALBUMIN 2.7 (L) 05/21/2021 0540   AST 22 05/21/2021 0540   ALT 10 05/21/2021 0540   ALKPHOS 92 05/21/2021 0540   BILITOT 1.7 (H) 05/21/2021 0540   GFRNONAA >60 03/06/2022 0255   Lipase  No results found for: "LIPASE"  Studies/Results: No results  found.  Anti-infectives: Anti-infectives (From admission, onward)    None        Assessment/Plan 83M s/p GLF R 5th Rib fx - pain control, IS, pulm toilet  L hip pain and bruising - XR and CT negative for fx. Mobilize with theapies.  L radius fx - pain control. Non-op management in splint and outpatient follow up in his office. Left anterior abdominal wall hematoma that connects to a flank hematoma - obtain new abdominal binder.  ABL anemia - got a total of 2 units of pRBCs 12/20, 12/21.  Hgb stable. B/l ankle feet/pain - Reports baseline neuropathy. Suspect 2/2 this.  Incidental findings - pulm nodules, will need f/u with pcp.  Chronic pain - home suboxone. D/w pharmacy who will adjust scheduling to 0600am per patient request. Hx A. Fib  Hx Gastric bypass - multivitamin. Avoid nsaids Hx OSA - CPAP OHS Hx Pacemaker FEN - regular diet, bowel regimen, suppository prn DVT - SCDs, hold Lovenox given abl anemia.  ID - none Foley - none, voiding Dispo -Therapies rec SNF. Medically stable for SNF when bed available   LOS: 3 days    Letha Cape , [facility] Surgery 03/07/2022, 11:42 AM Please see Amion for pager number during day hours 7:00am-4:30pm

## 2022-03-07 NOTE — Progress Notes (Signed)
Occupational Therapy Treatment Patient Details Name: Jack Neal MRN: [id number] DOB: 10-03-1945 Today's Date: 03/07/2022   History of present illness Patient is a 76 y/o male who presents with left distal radius fx, right 5th rib fx and left hip pain s/p falling over his rollator after hitting a curb. PMH includes PAF, neuropathy, pacemaker, OSA on CPAP, PTSD, depression, chronic pain syndrome.   OT comments  Patient seated on EOB upon entry. Patient able to donn pants over feet while seated and min guard when standing to pull up with RUE only. Patient making good gains with mobility and activity tolerance. Patient was able to perform LB dressing toilet transfer, and grooming standing at sink without seated break. Patient is motivated toward recovery and would benefit from further OT treatment to increase independence and safety with self care.    Recommendations for follow up therapy are one component of a multi-disciplinary discharge planning process, led by the attending physician.  Recommendations may be updated based on patient status, additional functional criteria and insurance authorization.    Follow Up Recommendations  Skilled nursing-short term rehab (<3 hours/day)     Assistance Recommended at Discharge Intermittent Supervision/Assistance  Patient can return home with the following  A little help with walking and/or transfers;A little help with bathing/dressing/bathroom;Assistance with cooking/housework   Equipment Recommendations  None recommended by OT (defer to next venue. Patient asked about a platform walker for home)    Recommendations for Other Services      Precautions / Restrictions Precautions Precautions: Fall;Other (comment) Precaution Comments: hx of falls and vertigo Required Braces or Orthoses:  (abd binder and LUE sugar tong splint) Restrictions Weight Bearing Restrictions: Yes LUE Weight Bearing: Weight bear through elbow only Other Position/Activity  Restrictions: per MD Lovick verbal order 12/21       Mobility Bed Mobility Overal bed mobility: Needs Assistance             General bed mobility comments: seated on EOB upon entry and in recliner at end of session    Transfers Overall transfer level: Needs assistance Equipment used: Left platform walker Transfers: Sit to/from Stand Sit to Stand: Min guard           General transfer comment: performed chair and toilet transfers with use of Left platform walker and min guard assist     Balance Overall balance assessment: Needs assistance, History of Falls Sitting-balance support: Feet supported, No upper extremity supported Sitting balance-Leahy Scale: Fair Sitting balance - Comments: seated on EOB upon entry                                   ADL either performed or assessed with clinical judgement   ADL Overall ADL's : Needs assistance/impaired     Grooming: Wash/dry face;Oral care;Supervision/safety;Standing Grooming Details (indicate cue type and reason): assistance to open toothpaste only             Lower Body Dressing: Min guard;Sit to/from stand Lower Body Dressing Details (indicate cue type and reason): donned pants seated/standing with min guard to pull up Toilet Transfer: Ambulation;Regular Toilet;Rolling walker (2 wheels) (platform walker for LUE)             General ADL Comments: used platform walker for LUE    Extremity/Trunk Assessment Upper Extremity Assessment LUE Sensation: decreased light touch LUE Coordination: decreased fine motor;decreased gross motor (at elbow)  Vision       Perception     Praxis      Cognition Arousal/Alertness: Awake/alert Behavior During Therapy: WFL for tasks assessed/performed Overall Cognitive Status: Within Functional Limits for tasks assessed                                 General Comments: follow commands well        Exercises      Shoulder  Instructions       General Comments      Pertinent Vitals/ Pain       Pain Assessment Pain Assessment: Faces Faces Pain Scale: Hurts a little bit Pain Location: L flank, L wrist Pain Descriptors / Indicators: Sore, Discomfort, Guarding Pain Intervention(s): Limited activity within patient's tolerance, Monitored during session, Repositioned  Home Living                                          Prior Functioning/Environment              Frequency  Min 2X/week        Progress Toward Goals  OT Goals(current goals can now be found in the care plan section)  Progress towards OT goals: Progressing toward goals  Acute Rehab OT Goals Patient Stated Goal: get better OT Goal Formulation: With patient Time For Goal Achievement: 03/17/22 Potential to Achieve Goals: Good ADL Goals Pt Will Perform Lower Body Bathing: with modified independence;sit to/from stand Pt Will Perform Lower Body Dressing: with modified independence;sit to/from stand Pt Will Transfer to Toilet: with modified independence;ambulating  Plan Discharge plan remains appropriate    Co-evaluation                 AM-PAC OT "6 Clicks" Daily Activity     Outcome Measure   Help from another person eating meals?: None Help from another person taking care of personal grooming?: A Little Help from another person toileting, which includes using toliet, bedpan, or urinal?: A Little Help from another person bathing (including washing, rinsing, drying)?: A Little Help from another person to put on and taking off regular upper body clothing?: A Little Help from another person to put on and taking off regular lower body clothing?: A Little 6 Click Score: 19    End of Session Equipment Utilized During Treatment: Gait belt;Rolling walker (2 wheels);Other (comment) (left platform)  OT Visit Diagnosis: Other abnormalities of gait and mobility (R26.89);Unsteadiness on feet (R26.81)   Activity  Tolerance Patient tolerated treatment well   Patient Left in chair;with call bell/phone within reach;with chair alarm set   Nurse Communication Mobility status        Time: [id number] OT Time Calculation (min): 25 min  Charges: OT General Charges $OT Visit: 1 Visit OT Treatments $Self Care/Home Management : 23-37 mins  Lodema Hong, [place]  Office [phone]   Trixie Dredge 03/07/2022, 12:48 PM

## 2022-03-07 NOTE — Progress Notes (Signed)
Patient refused use of CPAP for the evening 

## 2022-03-08 LAB — TYPE AND SCREEN
ABO/RH(D): B POS
Antibody Screen: NEGATIVE
Unit division: 0
Unit division: 0
Unit division: 0
Unit division: 0
Unit division: 0

## 2022-03-08 LAB — BPAM RBC
Blood Product Expiration Date: 202312232359
Blood Product Expiration Date: 202312252359
Blood Product Expiration Date: 202312282359
Blood Product Expiration Date: 202401102359
Blood Product Expiration Date: 202401102359
ISSUE DATE / TIME: 202312201140
ISSUE DATE / TIME: 202312211015
ISSUE DATE / TIME: 202312211312
Unit Type and Rh: 1700
Unit Type and Rh: 1700
Unit Type and Rh: 1700
Unit Type and Rh: 7300
Unit Type and Rh: 7300

## 2022-03-08 MED ORDER — TRAZODONE HCL 50 MG PO TABS
50.0000 mg | ORAL_TABLET | Freq: Every day | ORAL | Status: DC
  Administered 2022-03-08 – 2022-03-09 (×2): 50 mg via ORAL
  Filled 2022-03-08 (×2): qty 1

## 2022-03-08 MED ORDER — PRAZOSIN HCL 2 MG PO CAPS: 3.0000 mg | ORAL_CAPSULE | Freq: Every evening | ORAL | Status: DC | PRN

## 2022-03-08 MED ORDER — PANCRELIPASE (LIP-PROT-AMYL) 36000-114000 UNITS PO CPEP
36000.0000 [IU] | ORAL_CAPSULE | Freq: Three times a day (TID) | ORAL | Status: DC
  Administered 2022-03-08 – 2022-03-10 (×7): 36000 [IU] via ORAL
  Filled 2022-03-08 (×7): qty 1

## 2022-03-08 MED ORDER — DULOXETINE HCL 30 MG PO CPEP
30.0000 mg | ORAL_CAPSULE | Freq: Two times a day (BID) | ORAL | Status: DC
  Administered 2022-03-08 – 2022-03-10 (×5): 30 mg via ORAL
  Filled 2022-03-08 (×5): qty 1

## 2022-03-08 MED ORDER — MEMANTINE HCL 10 MG PO TABS
20.0000 mg | ORAL_TABLET | Freq: Every day | ORAL | Status: DC
  Administered 2022-03-08 – 2022-03-09 (×2): 20 mg via ORAL
  Filled 2022-03-08 (×3): qty 2

## 2022-03-08 NOTE — Progress Notes (Signed)
Subjective: No complaints today except bad dreams last night. Eating well and moving his bowels well.  Objective: Vital signs in last 24 hours: Temp:  [97.6 F (36.4 C)-98.3 F (36.8 C)] 98.1 F (36.7 C) (12/24 0725) Pulse Rate:  [78-81] 80 (12/24 0725) Resp:  [16-20] 17 (12/24 0725) BP: (102-123)/(62-86) 113/63 (12/24 0725) SpO2:  [97 %-100 %] 100 % (12/24 0725) Last BM Date : 03/07/22  Intake/Output from previous day: 12/23 0701 - 12/24 0700 In: 1300 [P.O.:1300] Out: 2200 [Urine:2200] Intake/Output this shift: No intake/output data recorded.  PE: Gen:  Alert, NAD, pleasant Card:  Reg rate Pulm:  normal respiratory effort Abd: Soft, ND, NT, +BS. He does have noted bruising/hematoma over the left lateral abdominal wall/flank that is ttp, but mild today. Ext: L forearm splint in place. Moves all digits, wwp.  MAEs otherwise Psych: A&Ox3  Neuro: non-focal  Lab Results:  Recent Labs    03/06/22 0255 03/07/22 0207  WBC 5.4 5.1  HGB 8.4* 8.1*  HCT 25.3* 24.6*  PLT 189 189   BMET Recent Labs    03/06/22 0255  NA 139  K 4.2  CL 99  CO2 29  GLUCOSE 94  BUN 16  CREATININE 0.78  CALCIUM 8.3*   PT/INR No results for input(s): "LABPROT", "INR" in the last 72 hours. CMP     Component Value Date/Time   NA 139 03/06/2022 0255   NA 142 02/10/2021 1335   K 4.2 03/06/2022 0255   CL 99 03/06/2022 0255   CO2 29 03/06/2022 0255   GLUCOSE 94 03/06/2022 0255   BUN 16 03/06/2022 0255   BUN 17 02/10/2021 1335   CREATININE 0.78 03/06/2022 0255   CALCIUM 8.3 (L) 03/06/2022 0255   PROT 5.2 (L) 05/21/2021 0540   ALBUMIN 2.7 (L) 05/21/2021 0540   AST 22 05/21/2021 0540   ALT 10 05/21/2021 0540   ALKPHOS 92 05/21/2021 0540   BILITOT 1.7 (H) 05/21/2021 0540   GFRNONAA >60 03/06/2022 0255   Lipase  No results found for: "LIPASE"  Studies/Results: No results found.  Anti-infectives: Anti-infectives (From admission, onward)    None         Assessment/Plan 8M s/p GLF R 5th Rib fx - pain control, IS, pulm toilet  L hip pain and bruising - XR and CT negative for fx. Mobilize with theapies.  L radius fx - pain control. Non-op management in splint and outpatient follow up in his office. Left anterior abdominal wall hematoma that connects to a flank hematoma - obtain new abdominal binder.  ABL anemia - got a total of 2 units of pRBCs 12/20, 12/21.  Hgb stable. B/l ankle feet/pain - Reports baseline neuropathy. Suspect 2/2 this.  Incidental findings - pulm nodules, will need f/u with pcp.  Chronic pain - home suboxone. D/w pharmacy who will adjust scheduling to 0600am per patient request. Hx A. Fib  Hx Gastric bypass - multivitamin. Avoid nsaids Hx OSA - CPAP OHS Hx Pacemaker Bad dreams/panic attack - resume prn prazosin for this. Chronic medical problems - resume multiple home meds for depression/pancreatic insufficiency, etc FEN - regular diet, bowel regimen, suppository prn DVT - SCDs, hold Lovenox given abl anemia.  ID - none Foley - none, voiding Dispo -Therapies rec SNF. Medically stable for SNF when bed available   LOS: 4 days    Letha Cape , [facility] Surgery 03/08/2022, 8:47 AM Please see Amion for pager number during day hours 7:00am-4:30pm

## 2022-03-09 MED ORDER — ENOXAPARIN SODIUM 30 MG/0.3ML IJ SOSY
30.0000 mg | PREFILLED_SYRINGE | Freq: Two times a day (BID) | INTRAMUSCULAR | Status: DC
  Administered 2022-03-09 – 2022-03-10 (×3): 30 mg via SUBCUTANEOUS
  Filled 2022-03-09 (×3): qty 0.3

## 2022-03-09 NOTE — Progress Notes (Signed)
       Subjective: Some pain overnight but took tramadol then had some of the best sleep so far.  Eating well.  Objective: Vital signs in last 24 hours: Temp:  [97.8 F (36.6 C)-98.4 F (36.9 C)] 98.4 F (36.9 C) (12/25 0600) Pulse Rate:  [82-87] 87 (12/25 0600) Resp:  [17-18] 18 (12/25 0600) BP: (115-123)/(70-76) 123/76 (12/25 0600) SpO2:  [97 %-98 %] 97 % (12/25 0600) Last BM Date : 03/07/22  Intake/Output from previous day: 12/24 0701 - 12/25 0700 In: 460 [P.O.:460] Out: 2150 [Urine:2150] Intake/Output this shift: No intake/output data recorded.  PE: Gen:  Alert, NAD, pleasant Card:  Reg rate Pulm:  normal respiratory effort Abd: Soft, ND, NT, +BS. He does have noted bruising/hematoma over the left lateral abdominal wall/flank that is ttp, but mild today. Ext: L forearm splint in place. Moves all digits, wwp.  MAEs otherwise Psych: A&Ox3  Neuro: non-focal  Lab Results:  Recent Labs    03/07/22 0207  WBC 5.1  HGB 8.1*  HCT 24.6*  PLT 189   BMET No results for input(s): "NA", "K", "CL", "CO2", "GLUCOSE", "BUN", "CREATININE", "CALCIUM" in the last 72 hours.  PT/INR No results for input(s): "LABPROT", "INR" in the last 72 hours. CMP     Component Value Date/Time   NA 139 03/06/2022 0255   NA 142 02/10/2021 1335   K 4.2 03/06/2022 0255   CL 99 03/06/2022 0255   CO2 29 03/06/2022 0255   GLUCOSE 94 03/06/2022 0255   BUN 16 03/06/2022 0255   BUN 17 02/10/2021 1335   CREATININE 0.78 03/06/2022 0255   CALCIUM 8.3 (L) 03/06/2022 0255   PROT 5.2 (L) 05/21/2021 0540   ALBUMIN 2.7 (L) 05/21/2021 0540   AST 22 05/21/2021 0540   ALT 10 05/21/2021 0540   ALKPHOS 92 05/21/2021 0540   BILITOT 1.7 (H) 05/21/2021 0540   GFRNONAA >60 03/06/2022 0255   Lipase  No results found for: "LIPASE"  Studies/Results: No results found.  Anti-infectives: Anti-infectives (From admission, onward)    None        Assessment/Plan 28M s/p GLF R 5th Rib fx - pain  control, IS, pulm toilet  L hip pain and bruising - XR and CT negative for fx. Mobilize with theapies.  L radius fx - pain control. Non-op management in splint and outpatient follow up in Dr. Georgia Duff office. Left anterior abdominal wall hematoma that connects to a flank hematoma - obtain new abdominal binder.  ABL anemia - got a total of 2 units of pRBCs 12/20, 12/21.  Hgb stable. B/l ankle feet/pain - Reports baseline neuropathy. Suspect 2/2 this.  Incidental findings - pulm nodules, will need f/u with pcp.  Chronic pain - home suboxone. D/w pharmacy who will adjust scheduling to 0600am per patient request. Hx A. Fib  Hx Gastric bypass - multivitamin. Avoid nsaids Hx OSA - CPAP OHS Hx Pacemaker Bad dreams/panic attack - resume prn prazosin for this. Chronic medical problems - resume multiple home meds for depression/pancreatic insufficiency, etc FEN - regular diet, bowel regimen, suppository prn DVT - SCDs, hgb stable, ? Start lovenox ID - none Foley - none, voiding Dispo -Therapies rec SNF. Medically stable for SNF when bed available   LOS: 5 days    Jack Neal , [facility] Surgery 03/09/2022, 8:23 AM Please see Amion for pager number during day hours 7:00am-4:30pm

## 2022-03-09 NOTE — Progress Notes (Signed)
Pt refused CPAP at this time.

## 2022-03-09 NOTE — Progress Notes (Signed)
CPAP refused at this time.

## 2022-03-10 MED ORDER — LIDOCAINE 5 % EX PTCH: 1.0000 | MEDICATED_PATCH | CUTANEOUS | 0 refills | Status: AC

## 2022-03-10 MED ORDER — TRAMADOL HCL 50 MG PO TABS: 25.0000 mg | ORAL_TABLET | Freq: Four times a day (QID) | ORAL | 0 refills | Status: AC | PRN

## 2022-03-10 MED ORDER — METHOCARBAMOL 500 MG PO TABS: 500.0000 mg | ORAL_TABLET | Freq: Four times a day (QID) | ORAL | 0 refills | Status: AC | PRN

## 2022-03-10 MED ORDER — ORAL CARE MOUTH RINSE: 15.0000 mL | OROMUCOSAL | Status: DC | PRN

## 2022-03-10 NOTE — Progress Notes (Signed)
Physical Therapy Treatment Patient Details Name: Jack Neal MRN: [id number] DOB: 1946-03-13 Today's Date: 03/10/2022   History of Present Illness Patient is a 76 y/o male who presents with left distal radius fx, right 5th rib fx and left hip pain s/p falling over his rollator after hitting a curb. PMH includes PAF, neuropathy, pacemaker, OSA on CPAP, PTSD, depression, chronic pain syndrome.    PT Comments    Pt received up in bathroom, finishing self-care task at sink, pt agreeable to therapy session with emphasis on safe L PF RW use, balance assessment and gait training with emphasis on activity pacing and fall risk prevention strategies. Pt scored 15/24 on Dynamic Gait Index due to use of AD but no overt LOB while using PF RW with balance tasks. Pt needing up to minA to ascend steps to simulate curb ascent. Pt modI with L PFRW and transfers and needs up to minA for stair descent without rail. Anticipate he is safe to DC home with family assist once medically cleared, recommend he have family stay with him and spouse for a day or two to ensure he is set up well to manage household tasks, would be beneficial to have max [facility] services and consider an aide for keeping up with chores/housework given LUE restrictions likely to continue for 4 more weeks. Pt continues to benefit from PT services to progress toward functional mobility goals. Disposition updated below per discussion with supervising PT Shauna H.  Recommendations for follow up therapy are one component of a multi-disciplinary discharge planning process, led by the attending physician.  Recommendations may be updated based on patient status, additional functional criteria and insurance authorization.  Follow Up Recommendations  Home health PT Can patient physically be transported by private vehicle: Yes   Assistance Recommended at Discharge Frequent or constant Supervision/Assistance  Patient can return home with the following A little help  with walking and/or transfers;A little help with bathing/dressing/bathroom;Assistance with cooking/housework;Help with stairs or ramp for entrance;Assist for transportation   Equipment Recommendations  Rolling walker (2 wheels);Other (comment) (Left Platform RW and a sling for comfort)    Recommendations for Other Services       Precautions / Restrictions Precautions Precautions: Fall;Other (comment) Precaution Comments: hx of falls and vertigo Required Braces or Orthoses:  (abd binder and LUE sugar tong splint) Restrictions Weight Bearing Restrictions: Yes LUE Weight Bearing: Weight bear through elbow only Other Position/Activity Restrictions: per MD Lovick verbal order 12/21     Mobility  Bed Mobility                    Transfers Overall transfer level: Needs assistance Equipment used: Left platform walker Transfers: Sit to/from Stand Sit to Stand: Modified independent (Device/Increase time)           General transfer comment: from recliner x5 reps using RUE    Ambulation/Gait Ambulation/Gait assistance: Modified independent (Device/Increase time) Gait Distance (Feet): 300 Feet Assistive device: Left platform walker Gait Pattern/deviations: Step-through pattern       General Gait Details: No c/o vertigo this date. Pt tolerated gait well with L PFRW, no LOB or buckling with DGI tasks   Stairs Stairs: Yes Stairs assistance: Min assist Stair Management: Step to pattern, Forwards, One rail Right, No rails Number of Stairs: 3 General stair comments: 1 rail on RUE ascending, HHA on RUE descending, no buckling or overt LOB   Wheelchair Mobility    Modified Rankin (Stroke Patients Only)  Balance Overall balance assessment: Needs assistance, History of Falls Sitting-balance support: Feet supported, No upper extremity supported Sitting balance-Leahy Scale: Good     Standing balance support: No upper extremity supported, During functional  activity Standing balance-Leahy Scale: Fair Standing balance comment: Able to stand statically with Supervision but needs 1-2 UE support for dynamic challenges                 Standardized Balance Assessment Standardized Balance Assessment : Dynamic Gait Index   Dynamic Gait Index Level Surface: Mild Impairment Change in Gait Speed: Mild Impairment Gait with Horizontal Head Turns: Mild Impairment Gait with Vertical Head Turns: Mild Impairment Gait and Pivot Turn: Mild Impairment Step Over Obstacle: Mild Impairment Step Around Obstacles: Mild Impairment Steps: Moderate Impairment Total Score: 15      Cognition Arousal/Alertness: Awake/alert Behavior During Therapy: WFL for tasks assessed/performed Overall Cognitive Status: Within Functional Limits for tasks assessed                                 General Comments: follow commands well        Exercises Other Exercises Other Exercises: STS x 5 reps Other Exercises: encouraged compliance with IS hourly    General Comments General comments (skin integrity, edema, etc.): no c/o dizziness, VSS per chart review      Pertinent Vitals/Pain Pain Assessment Pain Assessment: Faces Faces Pain Scale: Hurts a little bit Pain Location: L flank, L wrist, generalized Pain Descriptors / Indicators: Sore, Discomfort Pain Intervention(s): Monitored during session, Premedicated before session    Home Living                          Prior Function            PT Goals (current goals can now be found in the care plan section) Acute Rehab PT Goals Patient Stated Goal: to get stronger and go home PT Goal Formulation: With patient Time For Goal Achievement: 03/17/22 Progress towards PT goals: Progressing toward goals    Frequency    Min 3X/week      PT Plan Discharge plan needs to be updated    Co-evaluation              AM-PAC PT "6 Clicks" Mobility   Outcome Measure  Help needed  turning from your back to your side while in a flat bed without using bedrails?: A Little Help needed moving from lying on your back to sitting on the side of a flat bed without using bedrails?: A Little Help needed moving to and from a bed to a chair (including a wheelchair)?: A Little Help needed standing up from a chair using your arms (e.g., wheelchair or bedside chair)?: A Little Help needed to walk in hospital room?: A Little Help needed climbing 3-5 steps with a railing? : A Little 6 Click Score: 18    End of Session   Activity Tolerance: Patient tolerated treatment well Patient left: in chair;with call bell/phone within reach Nurse Communication: Mobility status;Other (comment) (dispo rec chg) PT Visit Diagnosis: Pain;Unsteadiness on feet (R26.81);Difficulty in walking, not elsewhere classified (R26.2) Pain - Right/Left: Left Pain - part of body: Arm;Hand     Time: [id number] PT Time Calculation (min) (ACUTE ONLY): 19 min  Charges:  $Gait Training: 8-22 mins  Florina Ou., PTA Acute Rehabilitation Services Secure Chat Preferred 9a-5:30pm Office: [phone]    Dorathy Kinsman [facility] 03/10/2022, 11:08 AM

## 2022-03-10 NOTE — Discharge Summary (Signed)
Patient ID: Jack Neal [id number] Mar 15, 1946 76 y.o.  Admit date: 03/02/2022 Discharge date: 03/10/2022  Admitting Diagnosis: GLF L radius fx L 5th rib fx Abdominal wall hematoma  Discharge Diagnosis Patient Active Problem List   Diagnosis Date Noted   Rib fracture 03/02/2022   Chronic posttraumatic stress disorder    Mild cognitive impairment    Panic disorder with agoraphobia and moderate panic attacks    Intertrochanteric fracture of right hip ([place]) 05/20/2021   Thrombocytopenia ([place]) 05/16/2021   Hyponatremia 05/16/2021   Abnormal thyroid function test 05/16/2021   Dysuria 05/16/2021   Malnutrition of moderate degree 05/16/2021   Closed right hip fracture ([place]) 05/15/2021   Opioid dependence ([place]) 05/15/2021   Dementia without behavioral disturbance ([place]) 05/15/2021   Pancreatic insufficiency 05/15/2021   Closed fracture of acromion 05/15/2021   Macrocytic anemia 05/15/2021   Constipation 05/15/2021   Atrial fibrillation ([place]) 01/02/2021   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   Permanent atrial fibrillation ([place]) 12/23/2020   Hallucination    Spell of abnormal behavior    AMS (altered mental status) 11/09/2020   Fall 10/05/2020   Episode of shaking    Abnormal head CT    Generalized weakness 10/01/2020   Unsteady gait when walking 10/01/2020   AF (paroxysmal atrial fibrillation) ([place]) 10/01/2020   Status post biventricular pacemaker 10/01/2020   Peripheral polyneuropathy 10/01/2020   Major depressive disorder 10/01/2020   OSA on CPAP 10/01/2020   Chronic pain syndrome 10/01/2020   Celiac disease 10/01/2020   History of gastric bypass 10/01/2020  ABL anemia Abdominal wall hematoma  L radius fx L 5th rib fx  Consultants Dr. Tempie Donning, ortho  Reason for Admission: 47M s/p GLF. Was walking with rollator walker and the walker got caught on the curb. Landed on his left side. Did not hit his head or lose consciousness.  Workmen at his home witnessed the fall and came to his assistance promptly. Report L hip pain.    Procedures none  Hospital Course:  47M s/p GLF  R 5th Rib fx Pain control, IS, pulm toilet   L hip pain and bruising  XR and CT negative for fx. Mobilize with theapies.   L radius fx  Pain control. Non-op management in splint and outpatient follow up in Dr. Madelynn Done office.  Left anterior abdominal wall hematoma that connects to a flank hematoma  Conservative management and observation.  He had an abdominal binder placed to help with compression. He had significant bruising, but no intervention was warranted.  ABL anemia  Got a total of 2 units of pRBCs 12/20, 12/21 secondary to anemia from his abdominal wall hematoma.  Hgb stable following these transfusions.  B/l ankle feet/pain  Reports baseline neuropathy. Suspect 2/2 this as no traumatic injury identified.  Incidental findings  Pulm nodules, will need f/u with pcp.   Chronic pain  Home suboxone was resumed.  Hx A. Fib  ASA when hgb stable was resumed.  Hx Gastric bypass  Multivitamin. Avoid nsaids  Hx OSA  Refused CPAP while here  Hx Pacemaker  Bad dreams/panic attack  Resume prn prazosin for this.  Chronic medical problems  Resume multiple home meds for depression/pancreatic insufficiency, etc  The patient was seen by therapies during his stay.  Initially SNF was recommended; however, while awaiting SNF placement the patient improved and was upgraded to [facility] PT/OT.  This was arranged and the patient was medically stable for DC home on HD 6.  Allergies as of 03/10/2022       Reactions   Amphetamine Rash, Other (See Comments)   Altered mental status- amnesia (also)   Diazepam Rash   Other reaction(s): Amnesia, Mental Status Changes (intolerance), Other amnesia   Zoloft [sertraline] Anxiety, Rash, Other (See Comments)   Panic attacks and Cardiovascular Arrest (ALSO)   Gabapentin    Tremors/jerking,  hallucinations, dry mouth   Gluten Meal Diarrhea   Penicillin G Rash        Medication List     TAKE these medications    acetaminophen 500 MG tablet Commonly known as: TYLENOL Take 500 mg by mouth every 6 (six) hours as needed for mild pain. What changed: Another medication with the same name was removed. Continue taking this medication, and follow the directions you see here.   Artificial Tears 1.4 % ophthalmic solution Generic drug: polyvinyl alcohol Place 2 drops into both eyes See admin instructions. Up to four times daily   aspirin EC 81 MG tablet Take 1 tablet (81 mg total) by mouth daily. Swallow whole.   bumetanide 2 MG tablet Commonly known as: BUMEX Take 2-4 mg by mouth daily as needed (edema).   buprenorphine-naloxone 2-0.5 mg Subl SL tablet Commonly known as: SUBOXONE Place 1 tablet under the tongue See admin instructions. May take one additional table as needed for pains.   CREON PO Take 36,000 Units by mouth 3 (three) times daily with meals.   diclofenac Sodium 1 % Gel Commonly known as: VOLTAREN Apply 2 g topically 2 (two) times daily. To affected area   DULoxetine 30 MG capsule Commonly known as: CYMBALTA Take 30 mg by mouth 2 (two) times daily. For PTSD and depression   eucerin cream Apply 1 Application topically 2 (two) times daily. To affected area   ferrous sulfate 325 (65 FE) MG tablet Take 325 mg by mouth every Monday, Wednesday, and Friday.   fluocinolone 0.01 % cream Commonly known as: VANOS Apply 1 Application topically See admin instructions. Apply a thin layer to affected area Tuesday, thursday and Saturday to scalp.   fluticasone 50 MCG/ACT nasal spray Commonly known as: FLONASE Place 2 sprays into both nostrils daily as needed for allergies or rhinitis.   galantamine 24 MG 24 hr capsule Commonly known as: RAZADYNE ER Take 24 mg by mouth daily. For memory loss   ketoconazole 2 % shampoo Commonly known as: NIZORAL Apply 1  Application topically 3 (three) times a week. Leave on affected area for 10 minutes then rinse off with water.   lidocaine 5 % Commonly known as: LIDODERM Place 1 patch onto the skin daily. Remove & Discard patch within 12 hours or as directed by MD What changed: how much to take   memantine 10 MG tablet Commonly known as: NAMENDA Take 20 mg by mouth at bedtime. For memory loss   methocarbamol 500 MG tablet Commonly known as: ROBAXIN Take 1 tablet (500 mg total) by mouth every 6 (six) hours as needed for muscle spasms.   naloxone 4 MG/0.1ML Liqd nasal spray kit Commonly known as: NARCAN Place 1 spray into the nose See admin instructions. Into the nostril as directed as needed for rescue for opioid overdose - call 911 immediately, administer dose, then turn person on side - if no response in 2-3 minutes or person responds but relapses, repeat using a new spray device and spray into the other nostril.   NON FORMULARY CPAP at bedtime   Oxcarbazepine 300 MG tablet Commonly known  as: TRILEPTAL Take 450-600 mg by mouth See admin instructions. Take 1 tablet by mouth at bedtime for 4 weeks, then take 2 tablets at bedtime for nerve pain, patient may titrate slowly to 2 tablets nightly.   pantoprazole 40 MG tablet Commonly known as: PROTONIX Take 1 tablet (40 mg total) by mouth daily. What changed:  when to take this additional instructions   prazosin 1 MG capsule Commonly known as: MINIPRESS Take 3 mg by mouth at bedtime.   traMADol 50 MG tablet Commonly known as: ULTRAM Take 0.5-1 tablets (25-50 mg total) by mouth every 6 (six) hours as needed for severe pain or moderate pain.   traZODone 50 MG tablet Commonly known as: DESYREL Take 1 tablet (50 mg total) by mouth at bedtime.   triamcinolone 0.025 % ointment Commonly known as: KENALOG Apply 1 Application topically See admin instructions. Apply as directed to affected area, Tuesday, Thursday and Saturday for pruritus (Do not  use on face)   zinc sulfate 220 (50 Zn) MG capsule Take 220 mg by mouth daily. For low zinc levels               Durable Medical Equipment  (From admission, onward)           Start     Ordered   03/10/22 1228  For home use only DME Other see comment  Once       Comments: Left Platform for rolling walker  Question:  Length of Need  Answer:  Lifetime   03/10/22 1227              Follow-up Information     Sherilyn Cooter, MD. Call in 1 week(s).   Specialty: Orthopedic Surgery Contact information: [address] [place] [address] [phone]         [facility], [facility] Follow up.   Why: please call to schedule a follow up for post hospital follow up as well as incidental finding of small pulmonary nodules that will need monitoring Contact information: [place] [address] [phone]                 Signed: Saverio Danker, [facility] 03/10/2022, 12:29 PM Please see Amion for pager number during day hours 7:00am-4:30pm, 7-11:30am on Weekends

## 2022-03-10 NOTE — TOC Transition Note (Addendum)
Transition of Care [facility]) - CM/SW Discharge Note   Patient Details  Name: Jack Neal MRN: [id number] Date of Birth: [birth year]  Transition of Care [facility]) CM/SW Contact:  Lawerance Sabal, RN Phone Number: 03/10/2022, 12:29 PM   Clinical Narrative:     Sherron Monday w patient over the phone to set up discharge. Notified by Trauma PA that patient is functioning at a high enough level to safely DC to home. Patient verbalized understanding of assessment and is agreeable with home DC with home health services. Records show that he was active w [facility] earlier this year and he would like to restart services with them. Referral accepted through [facility]. HH OT OP HHA.  Kandee Keen with [facility] has agreed to obtain auth for [facility] services through the [place], patient also has Medicare coverage.   Patient will need L platform for RW, he already has RW at home.  Referral made to [employer], awaiting to hear back.   [employer] confirmed they will be able to get platform for walker delivered to room today.   Final next level of care: Home w Home Health Services Barriers to Discharge: No Barriers Identified   Patient Goals and CMS Choice CMS Medicare.gov Compare Post Acute Care list provided to:: Patient Choice offered to / list presented to : Patient  Discharge Placement                         Discharge Plan and Services Additional resources added to the After Visit Summary for     Discharge Planning Services: CM Consult Post Acute Care Choice: Skilled Nursing Facility          DME Arranged:  (Left platform for rolling walker) DME Agency: [employer] Date DME Agency Contacted: 03/10/22 Time DME Agency Contacted: 1229 Representative spoke with at DME Agency: Vaughan Basta HH Arranged: PT, OT HH Agency: [facility] Date [facility] Agency Contacted: 03/10/22 Time HH Agency Contacted: 1229 Representative spoke with at [facility] Agency: Kandee Keen  Social Determinants of Health (SDOH) Interventions SDOH Screenings    Food Insecurity: No Food Insecurity (03/03/2022)  Housing: Low Risk  (03/03/2022)  Transportation Needs: No Transportation Needs (03/03/2022)  Utilities: Not At Risk (03/03/2022)  Tobacco Use: Medium Risk (03/03/2022)     Readmission Risk Interventions     No data to display

## 2022-03-10 NOTE — Progress Notes (Signed)
       Subjective: No new complaints or issues.  Objective: Vital signs in last 24 hours: Temp:  [97.6 F (36.4 C)-98.4 F (36.9 C)] 98.3 F (36.8 C) (12/26 0415) Pulse Rate:  [70-84] 70 (12/26 0415) Resp:  [16-18] 16 (12/25 1548) BP: (101-120)/(60-74) 101/60 (12/26 0415) SpO2:  [97 %-100 %] 97 % (12/26 0415) Last BM Date : 03/08/22  Intake/Output from previous day: 12/25 0701 - 12/26 0700 In: 220 [P.O.:220] Out: -  Intake/Output this shift: No intake/output data recorded.  PE: Gen:  Alert, NAD, pleasant Card:  Reg rate Pulm:  normal respiratory effort Abd: Soft, ND, NT, +BS. He does have noted bruising/hematoma over the left lateral abdominal wall/flank that is ttp, but mild. Ext: L forearm splint in place. Moves all digits, wwp.  MAEs otherwise Psych: A&Ox3  Neuro: non-focal  Lab Results:  No results for input(s): "WBC", "HGB", "HCT", "PLT" in the last 72 hours.  BMET No results for input(s): "NA", "K", "CL", "CO2", "GLUCOSE", "BUN", "CREATININE", "CALCIUM" in the last 72 hours.  PT/INR No results for input(s): "LABPROT", "INR" in the last 72 hours. CMP     Component Value Date/Time   NA 139 03/06/2022 0255   NA 142 02/10/2021 1335   K 4.2 03/06/2022 0255   CL 99 03/06/2022 0255   CO2 29 03/06/2022 0255   GLUCOSE 94 03/06/2022 0255   BUN 16 03/06/2022 0255   BUN 17 02/10/2021 1335   CREATININE 0.78 03/06/2022 0255   CALCIUM 8.3 (L) 03/06/2022 0255   PROT 5.2 (L) 05/21/2021 0540   ALBUMIN 2.7 (L) 05/21/2021 0540   AST 22 05/21/2021 0540   ALT 10 05/21/2021 0540   ALKPHOS 92 05/21/2021 0540   BILITOT 1.7 (H) 05/21/2021 0540   GFRNONAA >60 03/06/2022 0255   Lipase  No results found for: "LIPASE"  Studies/Results: No results found.  Anti-infectives: Anti-infectives (From admission, onward)    None        Assessment/Plan 55M s/p GLF R 5th Rib fx - pain control, IS, pulm toilet  L hip pain and bruising - XR and CT negative for fx. Mobilize  with theapies.  L radius fx - pain control. Non-op management in splint and outpatient follow up in Dr. Georgia Duff office. Left anterior abdominal wall hematoma that connects to a flank hematoma - obtain new abdominal binder.  ABL anemia - got a total of 2 units of pRBCs 12/20, 12/21.  Hgb stable. B/l ankle feet/pain - Reports baseline neuropathy. Suspect 2/2 this.  Incidental findings - pulm nodules, will need f/u with pcp.  Chronic pain - home suboxone. D/w pharmacy who will adjust scheduling to 0600am per patient request. Hx A. Fib  Hx Gastric bypass - multivitamin. Avoid nsaids Hx OSA - CPAP OHS Hx Pacemaker Bad dreams/panic attack - resume prn prazosin for this. Chronic medical problems - resume multiple home meds for depression/pancreatic insufficiency, etc FEN - regular diet, bowel regimen, suppository prn DVT - SCDs, hgb stable, ? Start lovenox ID - none Foley - none, voiding Dispo -Therapies rec SNF, but ambulating independently at this time.  Re-eval today for updated recommendations.   LOS: 6 days    Letha Cape , [facility] Surgery 03/10/2022, 8:50 AM Please see Amion for pager number during day hours 7:00am-4:30pm

## 2022-10-16 IMAGING — CT CT HEART MORPH/PULM VEIN W/ CM & W/O CA SCORE
2 of 5 series · 10 of 20 positions shown, 12 images · non-contrast
Comparison: None.
COMPARISON: None.

Addendum:
EXAM:
OVER-READ INTERPRETATION  CT CHEST

The following report is an over-read performed by radiologist Dr.
Canpo Lider [REDACTED] on 11/11/2020. This
over-read does not include interpretation of cardiac or coronary
anatomy or pathology. The coronary CTA interpretation by the
cardiologist is attached.
CLINICAL DATA: Atrial fibrillation scheduled for left atrial
appendage closure.
Cardiac CT/CTA
TECHNIQUE: A non-contrast, gated CT scan was obtained with axial slices of 3 mm
through the heart for calcium scoring. Calcium scoring was performed
using the Agatston method. A 120 kV retrospective, gated, contrast
cardiac scan was obtained. Gantry rotation speed was 250 msecs and
collimation was 0.6 mm. Nitroglycerin was not given. A delayed scan
was obtained to exclude left atrial appendage thrombus. The 3D
dataset was reconstructed in 5% intervals of the 25-50% of the R-R
cycle. Late systolic phases were analyzed on a dedicated workstation
using MPR, MIP, and VRT modes. The patient received 100 cc of
contrast.

[Series 6: best syst · axial · 0.45mm/px · z∈[-212,-122]mm · 7 of 303 slices shown, 9 images]
[im 38/303  vessel]
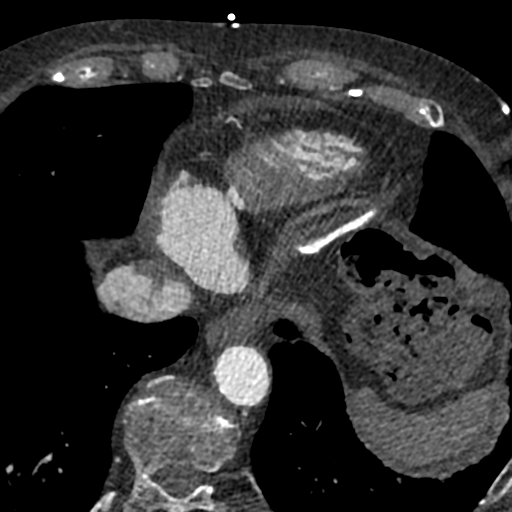
[im 38/303  lung]
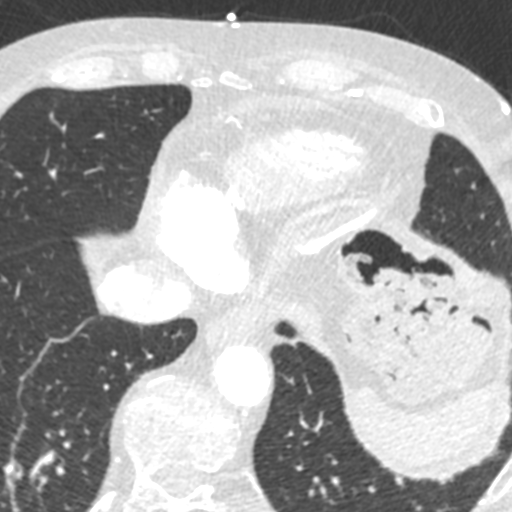
[im 76/303  vessel]
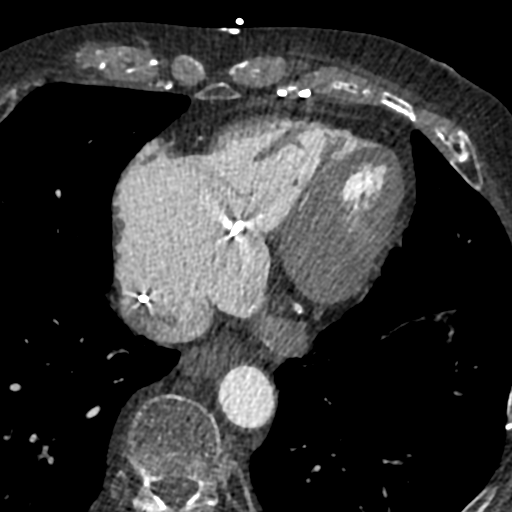
[im 114/303  vessel]
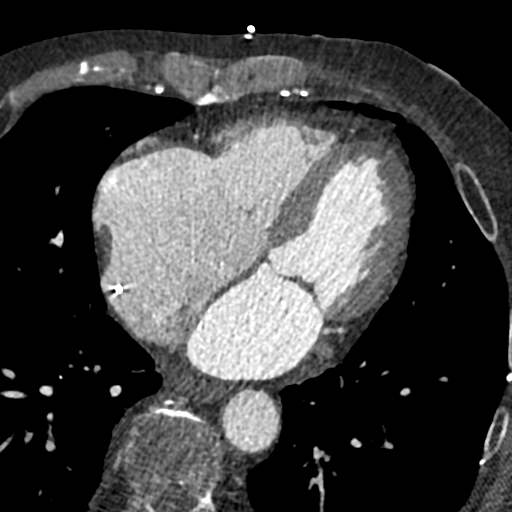
[im 152/303  vessel]
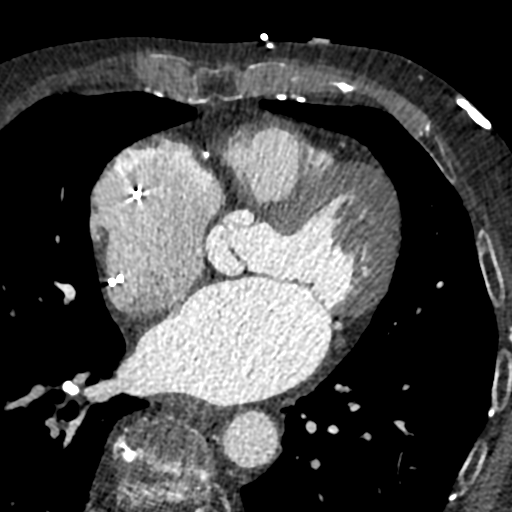
[im 189/303  vessel]
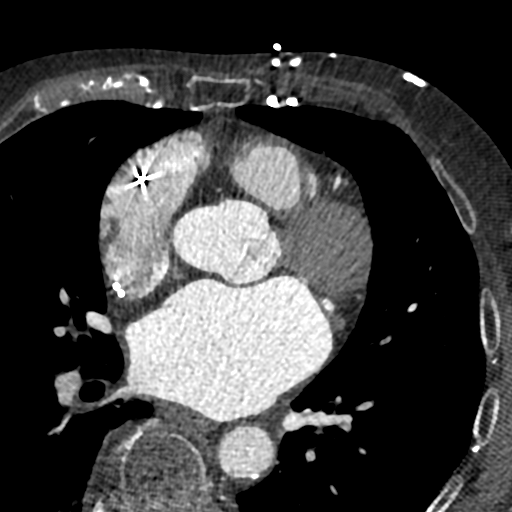
[im 189/303  lung]
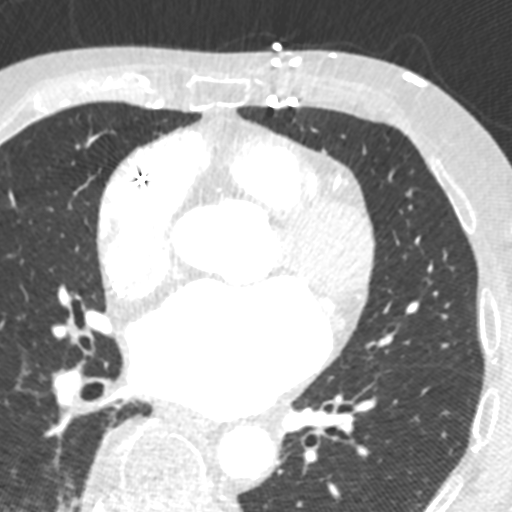
[im 227/303  vessel]
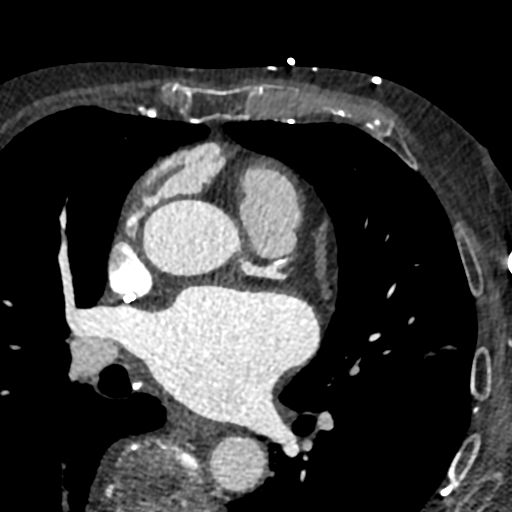
[im 265/303  vessel]
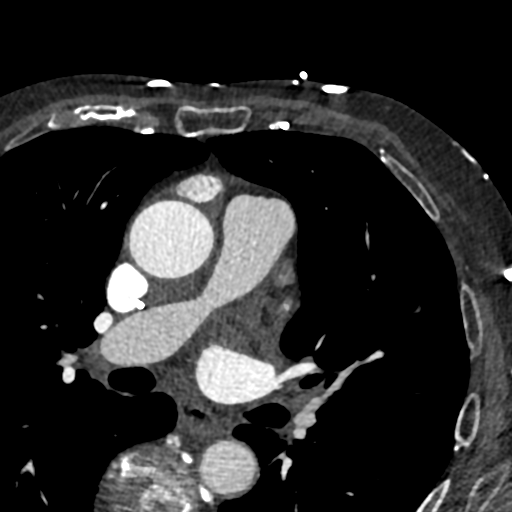

[Series 10: pv delay · axial · portal-venous · 0.45mm/px · z∈[-165,-126]mm · 3 of 156 slices shown]
[im 39/156  vessel]
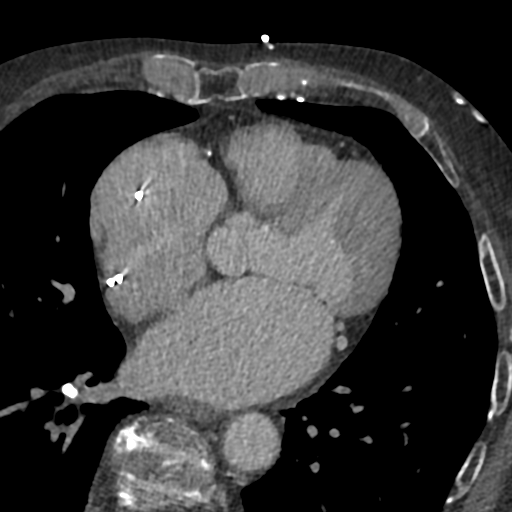
[im 78/156  vessel]
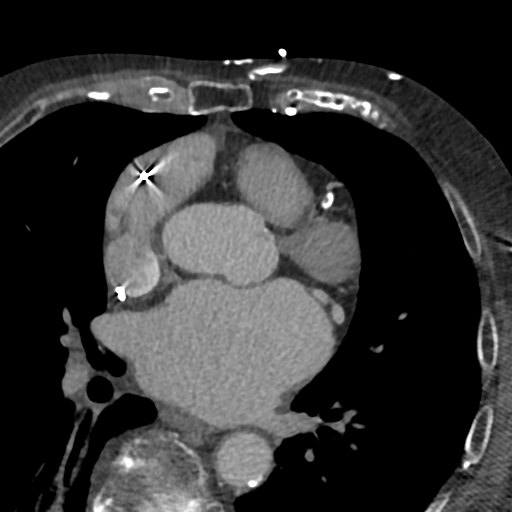
[im 117/156  vessel]
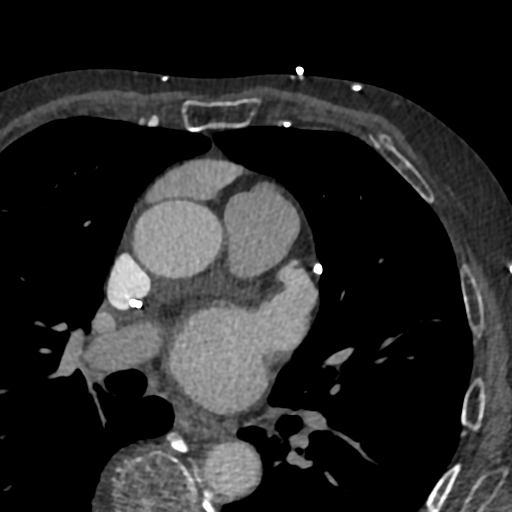

[10 of 20 positions shown; findings below may reference images not displayed]

FINDINGS: Limited view of the lung parenchyma demonstrates no suspicious
nodularity. Airways are normal.

Limited view of the mediastinum demonstrates no adenopathy.
Esophagus normal.

Limited view of the upper abdomen unremarkable.

Limited view of the skeleton and chest wall is unremarkable.
IMPRESSION: No significant extracardiac findings
FINDINGS: Left Atrium: The left atrial size is severely dilated (BANE =
mL/m2). There is a small PFO present. There is normal pulmonary vein
drainage into the left atrium (2 on the right and 2 on the left).

Left Atrial Appendage:

Morphology: The left atrial appendage appears windsock in
morphology.
Thrombus: There is systolic filling defect in the left atrial
appendage that is not present in delayed sequence. This is not
suggestive of thrombus.

The following measurements were made regarding left atrial appendage
closure:

Phase assessed: 45%

Landing Zone measurement: 25 mm X 22 mm.

MALORE Length (maximum): 27 mm

Optimal interatrial septum puncture site: Mid-posterior is
suggested. Presence of small PFO may make mid stick more
challenging.

Optimal deployment angle: RAO 32 [place] 22

Catheter: A double curve catheter is recommended.

Antonio Amaro Bellini Device: A 31 mm device is recommended with 20%
compression.

Right Atrium: There is a right atrial lead that terminates in the
right atrial appendage. The right atrium is severely dilated.

Right Ventricle: There is a right ventricular lead that terminates
in the right ventricular apex. The right ventricle appears mildly
dilated.

Left Ventricle: The ventricular cavity size is within normal limits.
There are no stigmata of prior infarction. There is no abnormal
filling defect.

Pulmonary Artery: Normal caliber without proximal filling defect.

Cardiac valves: The aortic valve is tri-leaflet without significant
calcification. The mitral valve is normal structure without
significant calcification.

Aorta: Normal caliber with no significant disease.

Pericardium: Normal thickness with no significant effusion or
calcium present.

Coronary Calcium Score:

Left main: 0

Left anterior descending artery: 595

Left circumflex artery: 140

Right coronary artery: 26

Total: 761

Percentile: 77th for age, sex, and race matched control.
IMPRESSION: 1. The left atrial appendage appears windsock in morphology. There
is systolic filling defect in the left atrial appendage that is not
present in delayed sequence. This is not suggestive of thrombus.

2. A 31 mm Wabenzy Vanderpuije device is recommended based on the above
landing zone measurements (25 mm maximum diameter; 20 %
compression).

3. An Monico Cusack puncture site is recommended.

4. Optimal deployment angle: RAO 32 [place] 22

5. Normal coronary origin. Left dominance.

6. Coronary artery calcium score 761, 77th for age, sex, and race
matched control.

*** End of Addendum ***
EXAM:
OVER-READ INTERPRETATION  CT CHEST

The following report is an over-read performed by radiologist Dr.
Canpo Lider [REDACTED] on 11/11/2020. This
over-read does not include interpretation of cardiac or coronary
anatomy or pathology. The coronary CTA interpretation by the
cardiologist is attached.
FINDINGS: Limited view of the lung parenchyma demonstrates no suspicious
nodularity. Airways are normal.

Limited view of the mediastinum demonstrates no adenopathy.
Esophagus normal.

Limited view of the upper abdomen unremarkable.

Limited view of the skeleton and chest wall is unremarkable.
IMPRESSION: No significant extracardiac findings

## 2022-11-05 ENCOUNTER — Telehealth: Payer: Self-pay

## 2022-11-05 NOTE — Telephone Encounter (Signed)
Called to check in with patient, who had LAAO on 01/02/2021. The patient is so excited to be off his blood thinners and is doing well from a cardiac perspective, but he did move to [facility] for assisted living due to his memory issues. He states he loves it there and is "treated like a king."  He will call if he has any questions or concerns.
